# Patient Record
Sex: Male | Born: 1935 | Race: White | Hispanic: No | Smoking: Former smoker
Health system: Southern US, Community
[De-identification: ages and names within clinical notes are randomized; demographics above are authoritative.]

## PROBLEM LIST (undated history)

## (undated) DIAGNOSIS — I639 Cerebral infarction, unspecified: Secondary | ICD-10-CM

## (undated) DIAGNOSIS — C44622 Squamous cell carcinoma of skin of right upper limb, including shoulder: Secondary | ICD-10-CM

## (undated) DIAGNOSIS — D500 Iron deficiency anemia secondary to blood loss (chronic): Secondary | ICD-10-CM

## (undated) DIAGNOSIS — I272 Pulmonary hypertension, unspecified: Secondary | ICD-10-CM

## (undated) DIAGNOSIS — D0461 Carcinoma in situ of skin of right upper limb, including shoulder: Secondary | ICD-10-CM

## (undated) DIAGNOSIS — I071 Rheumatic tricuspid insufficiency: Secondary | ICD-10-CM

## (undated) DIAGNOSIS — E785 Hyperlipidemia, unspecified: Secondary | ICD-10-CM

## (undated) DIAGNOSIS — N401 Enlarged prostate with lower urinary tract symptoms: Secondary | ICD-10-CM

## (undated) DIAGNOSIS — L719 Rosacea, unspecified: Secondary | ICD-10-CM

## (undated) DIAGNOSIS — K219 Gastro-esophageal reflux disease without esophagitis: Secondary | ICD-10-CM

## (undated) DIAGNOSIS — N138 Other obstructive and reflux uropathy: Secondary | ICD-10-CM

## (undated) DIAGNOSIS — Z87448 Personal history of other diseases of urinary system: Secondary | ICD-10-CM

## (undated) DIAGNOSIS — I10 Essential (primary) hypertension: Secondary | ICD-10-CM

## (undated) DIAGNOSIS — I4819 Other persistent atrial fibrillation: Secondary | ICD-10-CM

## (undated) DIAGNOSIS — I2510 Atherosclerotic heart disease of native coronary artery without angina pectoris: Secondary | ICD-10-CM

## (undated) DIAGNOSIS — L570 Actinic keratosis: Secondary | ICD-10-CM

## (undated) DIAGNOSIS — F4321 Adjustment disorder with depressed mood: Secondary | ICD-10-CM

## (undated) DIAGNOSIS — R001 Bradycardia, unspecified: Secondary | ICD-10-CM

## (undated) HISTORY — DX: Bradycardia, unspecified: R00.1

## (undated) HISTORY — PX: CORONARY ARTERY BYPASS GRAFT: SHX141

## (undated) HISTORY — DX: Cerebral infarction, unspecified: I63.9

## (undated) HISTORY — DX: Personal history of other diseases of urinary system: Z87.448

## (undated) HISTORY — PX: TONSILLECTOMY: SUR1361

## (undated) HISTORY — DX: Actinic keratosis: L57.0

## (undated) HISTORY — PX: ABDOMINAL SURGERY: SHX537

## (undated) HISTORY — PX: HERNIA REPAIR: SHX51

## (undated) HISTORY — DX: Essential (primary) hypertension: I10

## (undated) HISTORY — DX: Atherosclerotic heart disease of native coronary artery without angina pectoris: I25.10

## (undated) HISTORY — DX: Adjustment disorder with depressed mood: F43.21

## (undated) HISTORY — PX: BAND HEMORRHOIDECTOMY: SHX1213

## (undated) HISTORY — DX: Iron deficiency anemia secondary to blood loss (chronic): D50.0

## (undated) HISTORY — PX: CHOLECYSTECTOMY: SHX55

## (undated) HISTORY — DX: Gastro-esophageal reflux disease without esophagitis: K21.9

## (undated) HISTORY — PX: OTHER SURGICAL HISTORY: SHX169

## (undated) HISTORY — DX: Benign prostatic hyperplasia with lower urinary tract symptoms: N40.1

## (undated) HISTORY — DX: Hyperlipidemia, unspecified: E78.5

## (undated) HISTORY — DX: Rheumatic tricuspid insufficiency: I07.1

## (undated) HISTORY — DX: Pulmonary hypertension, unspecified: I27.20

## (undated) HISTORY — DX: Benign prostatic hyperplasia with lower urinary tract symptoms: N13.8

## (undated) HISTORY — DX: Rosacea, unspecified: L71.9

## (undated) HISTORY — PX: BACK SURGERY: SHX140

## (undated) HISTORY — PX: KIDNEY SURGERY: SHX687

## (undated) HISTORY — DX: Other persistent atrial fibrillation: I48.19

---
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
## 1999-04-28 ENCOUNTER — Ambulatory Visit (HOSPITAL_BASED_OUTPATIENT_CLINIC_OR_DEPARTMENT_OTHER): Admission: RE | Admit: 1999-04-28 | Discharge: 1999-04-28 | Payer: Self-pay | Admitting: Orthopedic Surgery

## 1999-05-09 ENCOUNTER — Encounter: Admission: RE | Admit: 1999-05-09 | Discharge: 1999-06-09 | Payer: Self-pay | Admitting: Orthopedic Surgery

## 2001-06-24 ENCOUNTER — Encounter: Admission: RE | Admit: 2001-06-24 | Discharge: 2001-07-08 | Payer: Self-pay | Admitting: Internal Medicine

## 2001-11-06 ENCOUNTER — Encounter: Admission: RE | Admit: 2001-11-06 | Discharge: 2001-11-06 | Payer: Self-pay | Admitting: Neurological Surgery

## 2001-11-06 ENCOUNTER — Encounter: Payer: Self-pay | Admitting: Neurological Surgery

## 2002-03-13 ENCOUNTER — Encounter: Payer: Self-pay | Admitting: Neurological Surgery

## 2002-03-13 ENCOUNTER — Encounter: Admission: RE | Admit: 2002-03-13 | Discharge: 2002-03-13 | Payer: Self-pay | Admitting: Neurological Surgery

## 2002-11-20 ENCOUNTER — Inpatient Hospital Stay (HOSPITAL_COMMUNITY): Admission: EM | Admit: 2002-11-20 | Discharge: 2002-12-05 | Payer: Self-pay | Admitting: *Deleted

## 2002-11-20 ENCOUNTER — Encounter: Payer: Self-pay | Admitting: Gastroenterology

## 2002-11-21 ENCOUNTER — Encounter: Payer: Self-pay | Admitting: Gastroenterology

## 2002-11-26 ENCOUNTER — Encounter: Payer: Self-pay | Admitting: Internal Medicine

## 2002-11-29 ENCOUNTER — Encounter: Payer: Self-pay | Admitting: Internal Medicine

## 2002-12-11 ENCOUNTER — Encounter: Payer: Self-pay | Admitting: Gastroenterology

## 2002-12-11 ENCOUNTER — Ambulatory Visit (HOSPITAL_COMMUNITY): Admission: RE | Admit: 2002-12-11 | Discharge: 2002-12-11 | Payer: Self-pay | Admitting: Gastroenterology

## 2002-12-15 ENCOUNTER — Encounter: Payer: Self-pay | Admitting: Gastroenterology

## 2002-12-15 ENCOUNTER — Inpatient Hospital Stay (HOSPITAL_COMMUNITY): Admission: EM | Admit: 2002-12-15 | Discharge: 2003-01-13 | Payer: Self-pay | Admitting: Emergency Medicine

## 2002-12-16 ENCOUNTER — Encounter: Payer: Self-pay | Admitting: Internal Medicine

## 2002-12-17 ENCOUNTER — Encounter: Payer: Self-pay | Admitting: Internal Medicine

## 2002-12-17 ENCOUNTER — Encounter: Payer: Self-pay | Admitting: Gastroenterology

## 2003-02-23 ENCOUNTER — Inpatient Hospital Stay
Admission: RE | Admit: 2003-02-23 | Discharge: 2003-03-06 | Payer: Self-pay | Admitting: Physical Medicine & Rehabilitation

## 2003-03-31 ENCOUNTER — Ambulatory Visit (HOSPITAL_COMMUNITY): Admission: RE | Admit: 2003-03-31 | Discharge: 2003-03-31 | Payer: Self-pay | Admitting: Internal Medicine

## 2003-06-25 ENCOUNTER — Ambulatory Visit (HOSPITAL_COMMUNITY): Admission: RE | Admit: 2003-06-25 | Discharge: 2003-06-25 | Payer: Self-pay | Admitting: Internal Medicine

## 2003-12-09 ENCOUNTER — Ambulatory Visit (HOSPITAL_COMMUNITY): Admission: RE | Admit: 2003-12-09 | Discharge: 2003-12-09 | Payer: Self-pay | Admitting: Internal Medicine

## 2004-01-07 ENCOUNTER — Ambulatory Visit: Payer: Self-pay | Admitting: Internal Medicine

## 2004-01-19 ENCOUNTER — Ambulatory Visit: Payer: Self-pay | Admitting: Internal Medicine

## 2004-01-27 ENCOUNTER — Encounter: Admission: RE | Admit: 2004-01-27 | Discharge: 2004-01-27 | Payer: Self-pay | Admitting: Urology

## 2004-01-29 ENCOUNTER — Encounter (INDEPENDENT_AMBULATORY_CARE_PROVIDER_SITE_OTHER): Payer: Self-pay | Admitting: *Deleted

## 2004-01-29 ENCOUNTER — Ambulatory Visit (HOSPITAL_BASED_OUTPATIENT_CLINIC_OR_DEPARTMENT_OTHER): Admission: RE | Admit: 2004-01-29 | Discharge: 2004-01-29 | Payer: Self-pay | Admitting: Urology

## 2004-01-29 ENCOUNTER — Ambulatory Visit (HOSPITAL_COMMUNITY): Admission: RE | Admit: 2004-01-29 | Discharge: 2004-01-29 | Payer: Self-pay | Admitting: Urology

## 2004-02-05 ENCOUNTER — Ambulatory Visit (HOSPITAL_BASED_OUTPATIENT_CLINIC_OR_DEPARTMENT_OTHER): Admission: RE | Admit: 2004-02-05 | Discharge: 2004-02-05 | Payer: Self-pay | Admitting: General Surgery

## 2004-02-05 ENCOUNTER — Encounter (INDEPENDENT_AMBULATORY_CARE_PROVIDER_SITE_OTHER): Payer: Self-pay | Admitting: *Deleted

## 2004-02-05 ENCOUNTER — Ambulatory Visit (HOSPITAL_COMMUNITY): Admission: RE | Admit: 2004-02-05 | Discharge: 2004-02-05 | Payer: Self-pay | Admitting: General Surgery

## 2004-02-17 ENCOUNTER — Ambulatory Visit (HOSPITAL_COMMUNITY): Admission: RE | Admit: 2004-02-17 | Discharge: 2004-02-17 | Payer: Self-pay | Admitting: Urology

## 2004-02-17 ENCOUNTER — Ambulatory Visit (HOSPITAL_BASED_OUTPATIENT_CLINIC_OR_DEPARTMENT_OTHER): Admission: RE | Admit: 2004-02-17 | Discharge: 2004-02-17 | Payer: Self-pay | Admitting: Urology

## 2004-02-17 ENCOUNTER — Encounter (INDEPENDENT_AMBULATORY_CARE_PROVIDER_SITE_OTHER): Payer: Self-pay | Admitting: *Deleted

## 2004-02-26 ENCOUNTER — Ambulatory Visit: Admission: RE | Admit: 2004-02-26 | Discharge: 2004-02-26 | Payer: Self-pay | Admitting: Urology

## 2004-04-25 ENCOUNTER — Ambulatory Visit (HOSPITAL_COMMUNITY): Admission: RE | Admit: 2004-04-25 | Discharge: 2004-04-25 | Payer: Self-pay | Admitting: Urology

## 2004-05-11 ENCOUNTER — Encounter (INDEPENDENT_AMBULATORY_CARE_PROVIDER_SITE_OTHER): Payer: Self-pay | Admitting: Specialist

## 2004-05-11 ENCOUNTER — Inpatient Hospital Stay (HOSPITAL_COMMUNITY): Admission: RE | Admit: 2004-05-11 | Discharge: 2004-05-14 | Payer: Self-pay | Admitting: Urology

## 2004-05-30 ENCOUNTER — Ambulatory Visit: Payer: Self-pay | Admitting: Gastroenterology

## 2004-05-30 ENCOUNTER — Inpatient Hospital Stay (HOSPITAL_COMMUNITY): Admission: EM | Admit: 2004-05-30 | Discharge: 2004-06-02 | Payer: Self-pay | Admitting: Emergency Medicine

## 2004-06-02 ENCOUNTER — Ambulatory Visit: Payer: Self-pay | Admitting: Internal Medicine

## 2004-06-09 ENCOUNTER — Ambulatory Visit: Payer: Self-pay | Admitting: Internal Medicine

## 2004-06-16 ENCOUNTER — Ambulatory Visit: Payer: Self-pay | Admitting: Internal Medicine

## 2004-06-30 ENCOUNTER — Ambulatory Visit: Payer: Self-pay | Admitting: Internal Medicine

## 2004-08-12 ENCOUNTER — Ambulatory Visit: Payer: Self-pay | Admitting: Internal Medicine

## 2004-11-25 ENCOUNTER — Ambulatory Visit (HOSPITAL_COMMUNITY): Admission: RE | Admit: 2004-11-25 | Discharge: 2004-11-25 | Payer: Self-pay | Admitting: General Surgery

## 2004-11-28 ENCOUNTER — Ambulatory Visit (HOSPITAL_COMMUNITY): Admission: RE | Admit: 2004-11-28 | Discharge: 2004-11-28 | Payer: Self-pay | Admitting: General Surgery

## 2004-12-12 ENCOUNTER — Ambulatory Visit: Payer: Self-pay | Admitting: Internal Medicine

## 2005-01-25 ENCOUNTER — Inpatient Hospital Stay (HOSPITAL_COMMUNITY): Admission: RE | Admit: 2005-01-25 | Discharge: 2005-01-30 | Payer: Self-pay | Admitting: General Surgery

## 2005-03-02 ENCOUNTER — Ambulatory Visit: Payer: Self-pay | Admitting: Internal Medicine

## 2005-03-13 ENCOUNTER — Ambulatory Visit: Payer: Self-pay | Admitting: Internal Medicine

## 2005-03-28 ENCOUNTER — Encounter: Admission: RE | Admit: 2005-03-28 | Discharge: 2005-03-28 | Payer: Self-pay | Admitting: Surgery

## 2005-04-13 ENCOUNTER — Ambulatory Visit: Payer: Self-pay | Admitting: Internal Medicine

## 2005-06-15 ENCOUNTER — Ambulatory Visit: Payer: Self-pay | Admitting: Internal Medicine

## 2005-07-04 ENCOUNTER — Ambulatory Visit: Payer: Self-pay | Admitting: Internal Medicine

## 2005-07-11 ENCOUNTER — Ambulatory Visit: Payer: Self-pay | Admitting: Internal Medicine

## 2005-12-29 ENCOUNTER — Ambulatory Visit: Payer: Self-pay | Admitting: Internal Medicine

## 2005-12-29 LAB — CONVERTED CEMR LAB
Chol/HDL Ratio, serum: 2.7
HDL: 50.2 mg/dL (ref >39.0)
LDL Cholesterol: 80 mg/dL (ref 0–99)
VLDL: 8 mg/dL (ref 0–40)

## 2006-01-08 ENCOUNTER — Ambulatory Visit: Payer: Self-pay | Admitting: Internal Medicine

## 2006-03-28 ENCOUNTER — Ambulatory Visit: Payer: Self-pay | Admitting: Internal Medicine

## 2006-03-28 LAB — CONVERTED CEMR LAB
AST: 20 units/L (ref 0–37)
BUN: 24 mg/dL — ABNORMAL HIGH (ref 6–23)
Creatinine, Ser: 1.4 mg/dL (ref 0.4–1.5)
Eosinophils Relative: 2.1 % (ref 0.0–5.0)
HCT: 37.4 % — ABNORMAL LOW (ref 39.0–52.0)
HDL: 50.2 mg/dL (ref >39.0)
Hemoglobin: 13.5 g/dL (ref 13.0–17.0)
Iron: 87 ug/dL (ref 42–165)
Lymphocytes Relative: 27.7 % (ref 12.0–46.0)
MCHC: 36.1 g/dL — ABNORMAL HIGH (ref 30.0–36.0)
MCV: 92.5 fL (ref 78.0–100.0)
Monocytes Absolute: 0.6 10*3/uL (ref 0.2–0.7)
Platelets: 139 10*3/uL — ABNORMAL LOW (ref 150–400)
Sodium: 138 meq/L (ref 135–145)
TSH: 3.1 microintl units/mL (ref 0.35–5.50)
Total Bilirubin: 0.4 mg/dL (ref 0.3–1.2)
VLDL: 16 mg/dL (ref 0–40)

## 2006-05-29 ENCOUNTER — Ambulatory Visit: Payer: Self-pay | Admitting: Internal Medicine

## 2006-07-09 ENCOUNTER — Ambulatory Visit: Payer: Self-pay | Admitting: Internal Medicine

## 2006-08-01 ENCOUNTER — Ambulatory Visit: Payer: Self-pay | Admitting: Internal Medicine

## 2006-08-01 LAB — CONVERTED CEMR LAB
Albumin: 3.9 g/dL (ref 3.5–5.2)
Alkaline Phosphatase: 46 units/L (ref 39–117)
Basophils Absolute: 0 10*3/uL (ref 0.0–0.1)
Basophils Relative: 0.4 % (ref 0.0–1.0)
Eosinophils Relative: 1.5 % (ref 0.0–5.0)
Iron: 99 ug/dL (ref 42–165)
Lymphocytes Relative: 23.1 % (ref 12.0–46.0)
Monocytes Absolute: 0.9 10*3/uL — ABNORMAL HIGH (ref 0.2–0.7)
Neutrophils Relative %: 60.3 % (ref 43.0–77.0)
Platelets: 150 10*3/uL (ref 150–400)
RBC: 3.66 M/uL — ABNORMAL LOW (ref 4.22–5.81)
RDW: 13.2 % (ref 11.5–14.6)
Total Protein: 6.8 g/dL (ref 6.0–8.3)

## 2006-08-23 DIAGNOSIS — I10 Essential (primary) hypertension: Secondary | ICD-10-CM | POA: Insufficient documentation

## 2006-08-23 DIAGNOSIS — K219 Gastro-esophageal reflux disease without esophagitis: Secondary | ICD-10-CM | POA: Insufficient documentation

## 2006-08-23 DIAGNOSIS — E785 Hyperlipidemia, unspecified: Secondary | ICD-10-CM | POA: Insufficient documentation

## 2006-08-23 DIAGNOSIS — I2510 Atherosclerotic heart disease of native coronary artery without angina pectoris: Secondary | ICD-10-CM | POA: Insufficient documentation

## 2006-10-15 ENCOUNTER — Telehealth: Payer: Self-pay | Admitting: Internal Medicine

## 2006-12-14 ENCOUNTER — Ambulatory Visit: Payer: Self-pay | Admitting: Emergency Medicine

## 2006-12-14 ENCOUNTER — Inpatient Hospital Stay (HOSPITAL_COMMUNITY): Admission: EM | Admit: 2006-12-14 | Discharge: 2006-12-25 | Payer: Self-pay | Admitting: Emergency Medicine

## 2006-12-14 ENCOUNTER — Ambulatory Visit: Payer: Self-pay | Admitting: Cardiovascular Disease

## 2006-12-17 ENCOUNTER — Ambulatory Visit: Payer: Self-pay | Admitting: Physical Medicine & Rehabilitation

## 2006-12-17 ENCOUNTER — Telehealth: Payer: Self-pay | Admitting: *Deleted

## 2006-12-22 ENCOUNTER — Encounter: Payer: Self-pay | Admitting: Internal Medicine

## 2006-12-24 ENCOUNTER — Encounter: Payer: Self-pay | Admitting: Internal Medicine

## 2006-12-25 ENCOUNTER — Ambulatory Visit: Payer: Self-pay | Admitting: Physical Medicine & Rehabilitation

## 2006-12-25 ENCOUNTER — Inpatient Hospital Stay (HOSPITAL_COMMUNITY)
Admission: RE | Admit: 2006-12-25 | Discharge: 2007-01-17 | Payer: Self-pay | Admitting: Physical Medicine & Rehabilitation

## 2006-12-31 ENCOUNTER — Ambulatory Visit: Payer: Self-pay | Admitting: Internal Medicine

## 2007-03-15 ENCOUNTER — Ambulatory Visit: Payer: Self-pay | Admitting: Physical Medicine & Rehabilitation

## 2007-03-15 ENCOUNTER — Encounter
Admission: RE | Admit: 2007-03-15 | Discharge: 2007-03-18 | Payer: Self-pay | Admitting: Physical Medicine & Rehabilitation

## 2007-03-18 ENCOUNTER — Telehealth: Payer: Self-pay | Admitting: Internal Medicine

## 2007-03-21 ENCOUNTER — Ambulatory Visit: Payer: Self-pay | Admitting: Internal Medicine

## 2007-03-26 ENCOUNTER — Ambulatory Visit: Payer: Self-pay | Admitting: Internal Medicine

## 2007-03-26 DIAGNOSIS — D500 Iron deficiency anemia secondary to blood loss (chronic): Secondary | ICD-10-CM

## 2007-03-26 DIAGNOSIS — I69351 Hemiplegia and hemiparesis following cerebral infarction affecting right dominant side: Secondary | ICD-10-CM | POA: Insufficient documentation

## 2007-03-26 HISTORY — DX: Iron deficiency anemia secondary to blood loss (chronic): D50.0

## 2007-03-26 LAB — CONVERTED CEMR LAB
Eosinophils Relative: 1.7 % (ref 0.0–5.0)
MCHC: 32.9 g/dL (ref 30.0–36.0)
Neutro Abs: 4.8 10*3/uL (ref 1.4–7.7)
Neutrophils Relative %: 62.1 % (ref 43.0–77.0)
Platelets: 171 10*3/uL (ref 150–400)
Transferrin: 202.3 mg/dL — ABNORMAL LOW (ref >=212.0)
WBC: 7.6 10*3/uL (ref 4.5–10.5)

## 2007-03-27 ENCOUNTER — Encounter
Admission: RE | Admit: 2007-03-27 | Discharge: 2007-06-25 | Payer: Self-pay | Admitting: Physical Medicine & Rehabilitation

## 2007-04-26 ENCOUNTER — Ambulatory Visit: Payer: Self-pay | Admitting: Internal Medicine

## 2007-04-26 DIAGNOSIS — N401 Enlarged prostate with lower urinary tract symptoms: Secondary | ICD-10-CM | POA: Insufficient documentation

## 2007-04-26 DIAGNOSIS — N138 Other obstructive and reflux uropathy: Secondary | ICD-10-CM

## 2007-04-26 LAB — CONVERTED CEMR LAB: PSA: 0.52 ng/mL (ref 0.10–4.00)

## 2007-05-13 ENCOUNTER — Ambulatory Visit: Payer: Self-pay | Admitting: Physical Medicine & Rehabilitation

## 2007-05-13 ENCOUNTER — Encounter
Admission: RE | Admit: 2007-05-13 | Discharge: 2007-08-11 | Payer: Self-pay | Admitting: Physical Medicine & Rehabilitation

## 2007-06-26 ENCOUNTER — Encounter
Admission: RE | Admit: 2007-06-26 | Discharge: 2007-09-24 | Payer: Self-pay | Admitting: Physical Medicine & Rehabilitation

## 2007-07-11 ENCOUNTER — Encounter: Payer: Self-pay | Admitting: Internal Medicine

## 2007-07-19 ENCOUNTER — Ambulatory Visit: Payer: Self-pay | Admitting: Internal Medicine

## 2007-07-19 LAB — CONVERTED CEMR LAB
Eosinophils Relative: 1.8 % (ref 0.0–5.0)
HCT: 38.2 % — ABNORMAL LOW (ref 39.0–52.0)
MCHC: 34.3 g/dL (ref 30.0–36.0)
MCV: 93.8 fL (ref 78.0–100.0)
Neutro Abs: 2.7 10*3/uL (ref 1.4–7.7)
RBC: 4.07 M/uL — ABNORMAL LOW (ref 4.22–5.81)
RDW: 13 % (ref 11.5–14.6)
WBC: 4.9 10*3/uL (ref 4.5–10.5)

## 2007-07-26 ENCOUNTER — Ambulatory Visit: Payer: Self-pay | Admitting: Internal Medicine

## 2007-07-26 DIAGNOSIS — F325 Major depressive disorder, single episode, in full remission: Secondary | ICD-10-CM | POA: Insufficient documentation

## 2007-07-26 DIAGNOSIS — M7500 Adhesive capsulitis of unspecified shoulder: Secondary | ICD-10-CM | POA: Insufficient documentation

## 2007-07-26 LAB — CONVERTED CEMR LAB
Albumin: 3.7 g/dL (ref 3.5–5.2)
Alkaline Phosphatase: 43 units/L (ref 39–117)
Bilirubin, Direct: 0.1 mg/dL (ref 0.0–0.3)
CO2: 32 meq/L (ref 19–32)
Chloride: 101 meq/L (ref 96–112)
Glucose, Bld: 125 mg/dL — ABNORMAL HIGH (ref 70–99)
Potassium: 4.4 meq/L (ref 3.5–5.1)
Sodium: 140 meq/L (ref 135–145)
Total Bilirubin: 0.9 mg/dL (ref 0.3–1.2)

## 2007-08-05 ENCOUNTER — Encounter: Payer: Self-pay | Admitting: Internal Medicine

## 2007-08-15 ENCOUNTER — Encounter: Payer: Self-pay | Admitting: Internal Medicine

## 2007-09-19 ENCOUNTER — Ambulatory Visit: Payer: Self-pay | Admitting: Internal Medicine

## 2007-10-03 ENCOUNTER — Encounter
Admission: RE | Admit: 2007-10-03 | Discharge: 2007-10-07 | Payer: Self-pay | Admitting: Physical Medicine & Rehabilitation

## 2007-10-07 ENCOUNTER — Ambulatory Visit: Payer: Self-pay | Admitting: Physical Medicine & Rehabilitation

## 2007-12-04 ENCOUNTER — Telehealth: Payer: Self-pay | Admitting: Internal Medicine

## 2007-12-18 ENCOUNTER — Ambulatory Visit: Payer: Self-pay | Admitting: Internal Medicine

## 2007-12-18 DIAGNOSIS — L719 Rosacea, unspecified: Secondary | ICD-10-CM | POA: Insufficient documentation

## 2008-01-21 ENCOUNTER — Telehealth: Payer: Self-pay | Admitting: Internal Medicine

## 2008-03-12 ENCOUNTER — Emergency Department (HOSPITAL_COMMUNITY): Admission: EM | Admit: 2008-03-12 | Discharge: 2008-03-13 | Payer: Self-pay | Admitting: Emergency Medicine

## 2008-03-17 ENCOUNTER — Encounter: Payer: Self-pay | Admitting: Internal Medicine

## 2008-03-19 ENCOUNTER — Ambulatory Visit: Payer: Self-pay | Admitting: Internal Medicine

## 2008-03-19 LAB — CONVERTED CEMR LAB
BUN: 23 mg/dL (ref 6–23)
Basophils Absolute: 0 10*3/uL (ref 0.0–0.1)
CO2: 30 meq/L (ref 19–32)
Calcium: 9.2 mg/dL (ref 8.4–10.5)
Chloride: 104 meq/L (ref 96–112)
Eosinophils Relative: 3.7 % (ref 0.0–5.0)
GFR calc Af Amer: 76 mL/min
GFR calc non Af Amer: 62 mL/min
HCT: 38.7 % — ABNORMAL LOW (ref 39.0–52.0)
HDL: 61.9 mg/dL (ref >39.0)
LDL Cholesterol: 72 mg/dL (ref 0–99)
MCV: 96 fL (ref 78.0–100.0)
Monocytes Relative: 13.3 % — ABNORMAL HIGH (ref 3.0–12.0)
Platelets: 101 10*3/uL — ABNORMAL LOW (ref 150–400)
Potassium: 4.3 meq/L (ref 3.5–5.1)
Total CHOL/HDL Ratio: 2.3
VLDL: 10 mg/dL (ref 0–40)
WBC: 4.3 10*3/uL — ABNORMAL LOW (ref 4.5–10.5)

## 2008-03-25 ENCOUNTER — Ambulatory Visit: Payer: Self-pay | Admitting: Internal Medicine

## 2008-03-26 ENCOUNTER — Telehealth: Payer: Self-pay | Admitting: Internal Medicine

## 2008-03-31 ENCOUNTER — Encounter
Admission: RE | Admit: 2008-03-31 | Discharge: 2008-03-31 | Payer: Self-pay | Admitting: Physical Medicine & Rehabilitation

## 2008-03-31 ENCOUNTER — Ambulatory Visit: Payer: Self-pay | Admitting: Physical Medicine & Rehabilitation

## 2008-04-02 ENCOUNTER — Encounter
Admission: RE | Admit: 2008-04-02 | Discharge: 2008-05-28 | Payer: Self-pay | Admitting: Physical Medicine & Rehabilitation

## 2008-06-12 ENCOUNTER — Ambulatory Visit: Payer: Self-pay | Admitting: Internal Medicine

## 2008-06-12 ENCOUNTER — Encounter: Payer: Self-pay | Admitting: Internal Medicine

## 2008-06-12 ENCOUNTER — Observation Stay (HOSPITAL_COMMUNITY): Admission: EM | Admit: 2008-06-12 | Discharge: 2008-06-12 | Payer: Self-pay | Admitting: Emergency Medicine

## 2008-06-24 ENCOUNTER — Encounter
Admission: RE | Admit: 2008-06-24 | Discharge: 2008-06-24 | Payer: Self-pay | Admitting: Physical Medicine & Rehabilitation

## 2008-06-25 ENCOUNTER — Ambulatory Visit: Payer: Self-pay | Admitting: Internal Medicine

## 2008-06-25 LAB — CONVERTED CEMR LAB
Basophils Relative: 0.5 % (ref 0.0–3.0)
Eosinophils Relative: 2.1 % (ref 0.0–5.0)
Hemoglobin: 10.3 g/dL — ABNORMAL LOW (ref 13.0–17.0)
Monocytes Absolute: 0.6 10*3/uL (ref 0.1–1.0)
Monocytes Relative: 12.3 % — ABNORMAL HIGH (ref 3.0–12.0)
Neutro Abs: 2.5 10*3/uL (ref 1.4–7.7)
Neutrophils Relative %: 57 % (ref 43.0–77.0)
Platelets: 139 10*3/uL — ABNORMAL LOW (ref 150.0–400.0)
RBC: 3.14 M/uL — ABNORMAL LOW (ref 4.22–5.81)

## 2008-07-16 IMAGING — CR DG ABD PORTABLE 1V
1 series · 1 of 1 positions shown · non-contrast
Comparison: 12/20/06.

CLINICAL DATA: 78-year-old with cerebrovascular accident and evaluate Panda tube placement. 
 PORTABLE ABDOMEN:

[view not recorded]
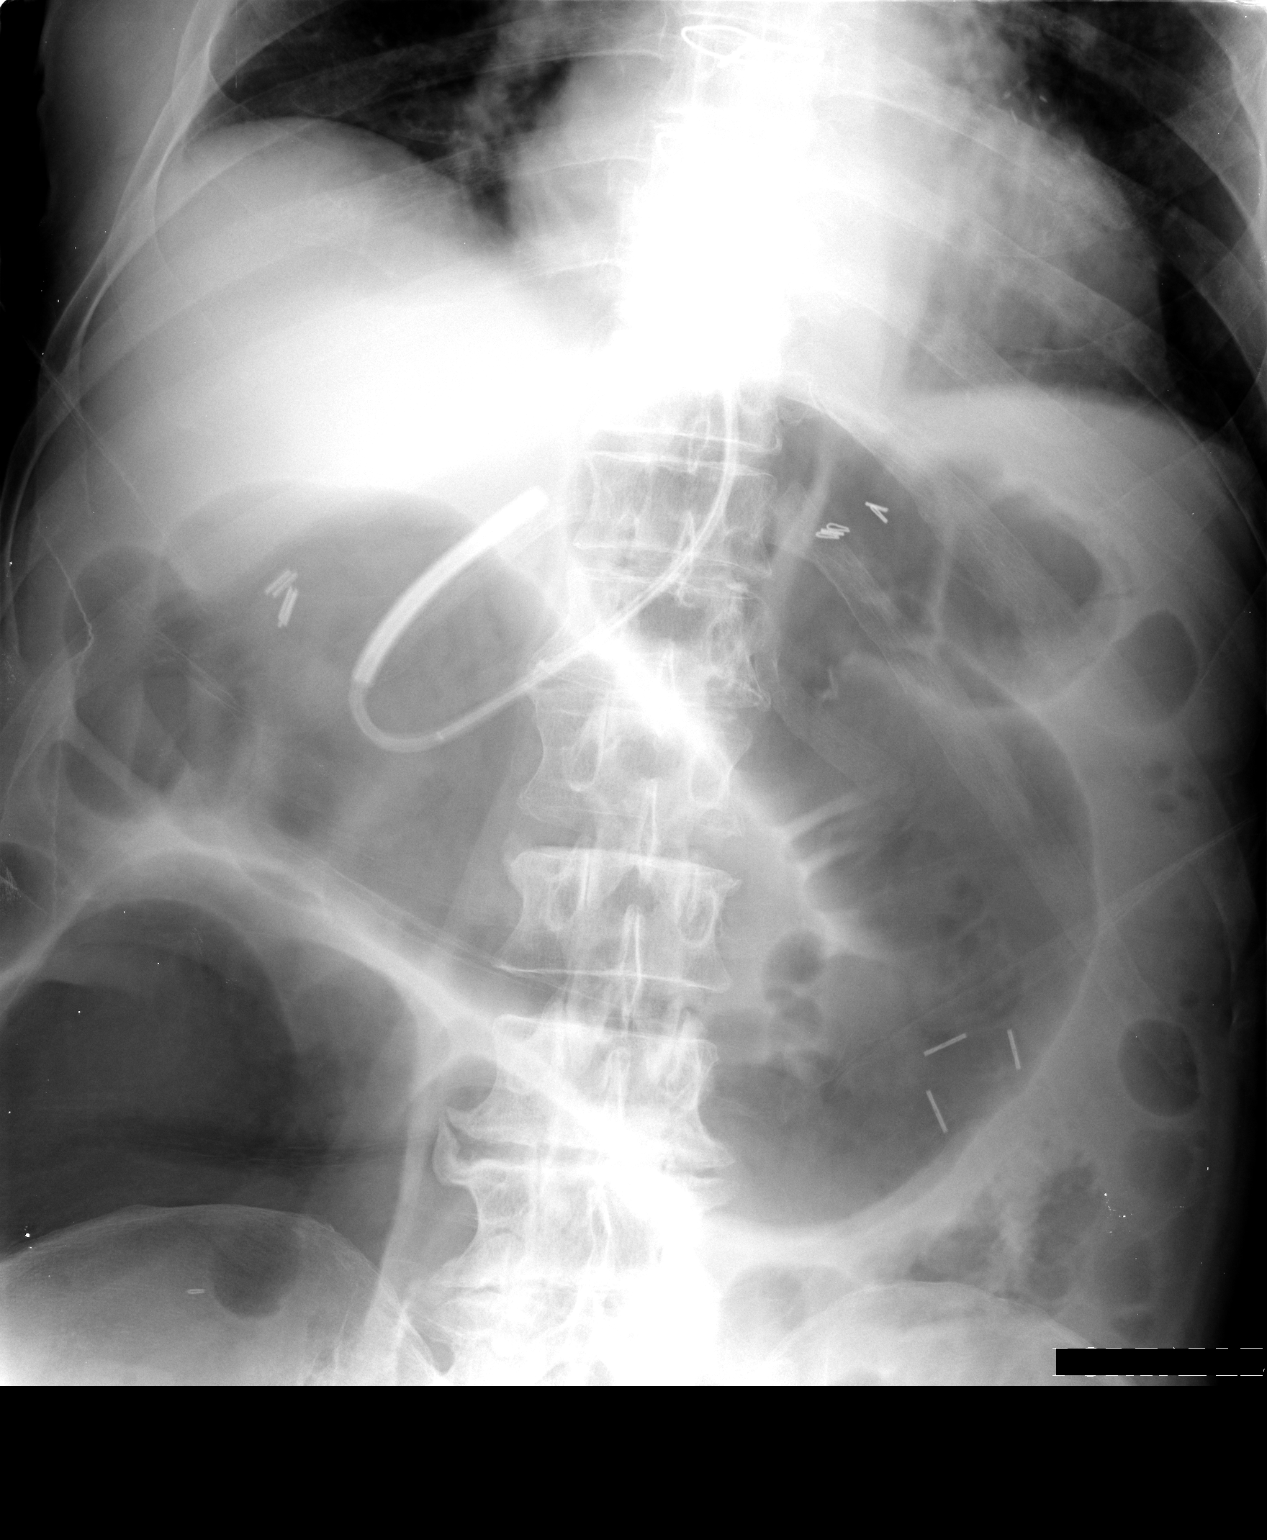

[1 of 1 positions shown; findings below may reference images not displayed]

FINDINGS: A single view of the abdomen demonstrates a Panda tube which appears to be coiled in the stomach.  The tip is in the region of the gastric antrum.  Again noted is massive colonic distention similar to the previous examination.  I cannot exclude a distal colonic obstruction or a transition point near the splenic flexure.  Surgical clips throughout the abdomen.
IMPRESSION: 1.  Panda tube in the region of the gastric antrum. 
 2.  Again noted is massive distention of the colon.

## 2008-07-17 IMAGING — CR DG ABD PORTABLE 1V
1 series · 1 of 1 positions shown · non-contrast
Comparison: none

CLINICAL DATA: stroke, colonic dilatation

[view not recorded]
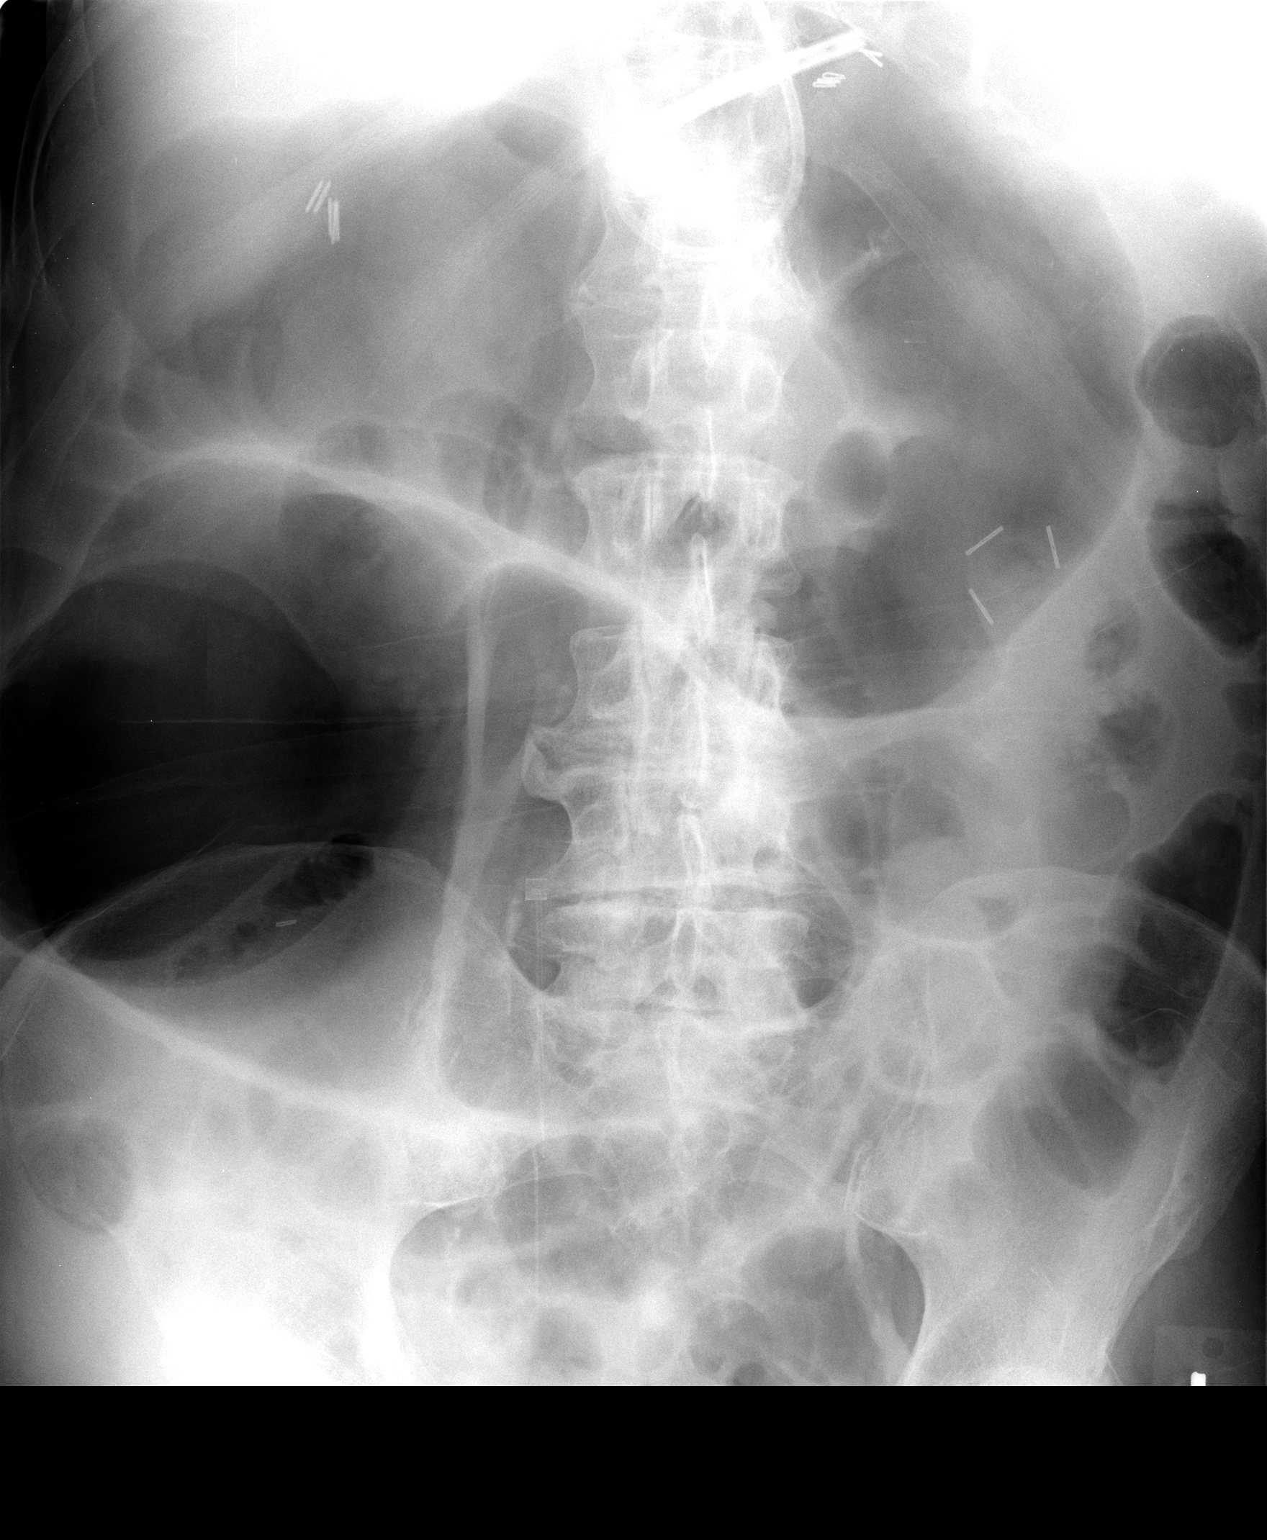

[1 of 1 positions shown; findings below may reference images not displayed]

Portable abdomen at 8538:

Comparison to previous day's exam. The panda feeding tube loops in the stomach.
There is marked gaseous distention of colon with the ascending colon/cecum
region measured 16 cm diameter, approximately same size as seen on previous
exam. There are a few gas filled nondilated small bowel loops scattered
throughout the abdomen. Degenerative changes in the lumbar spine.
IMPRESSION: 1. Gaseous dilatation of the proximal colon, cecum measures 16 cm diameter.

## 2008-08-18 ENCOUNTER — Ambulatory Visit: Payer: Self-pay | Admitting: Internal Medicine

## 2008-08-18 LAB — CONVERTED CEMR LAB
Hemoglobin: 14.4 g/dL (ref 13.0–17.0)
MCHC: 34.6 g/dL (ref 30.0–36.0)
MCV: 97.1 fL (ref 78.0–100.0)
Platelets: 109 10*3/uL — ABNORMAL LOW (ref 150.0–400.0)
RBC: 4.29 M/uL (ref 4.22–5.81)
Transferrin: 196.6 mg/dL — ABNORMAL LOW (ref 212.0–360.0)
WBC: 5.9 10*3/uL (ref 4.5–10.5)

## 2008-10-20 ENCOUNTER — Ambulatory Visit: Payer: Self-pay | Admitting: Internal Medicine

## 2008-12-14 ENCOUNTER — Telehealth: Payer: Self-pay | Admitting: Internal Medicine

## 2009-01-13 ENCOUNTER — Ambulatory Visit: Payer: Self-pay | Admitting: Internal Medicine

## 2009-02-22 ENCOUNTER — Telehealth: Payer: Self-pay | Admitting: Internal Medicine

## 2009-03-26 ENCOUNTER — Ambulatory Visit: Payer: Self-pay | Admitting: Internal Medicine

## 2009-03-26 DIAGNOSIS — R197 Diarrhea, unspecified: Secondary | ICD-10-CM | POA: Insufficient documentation

## 2009-03-26 LAB — CONVERTED CEMR LAB
Basophils Relative: 0.4 % (ref 0.0–3.0)
Calcium: 8.9 mg/dL (ref 8.4–10.5)
Eosinophils Relative: 1.7 % (ref 0.0–5.0)
GFR calc non Af Amer: 62.17 mL/min (ref >60)
Glucose, Bld: 93 mg/dL (ref 70–99)
HDL: 52.8 mg/dL (ref >39.00)
Lymphocytes Relative: 26.2 % (ref 12.0–46.0)
Lymphs Abs: 1.4 10*3/uL (ref 0.7–4.0)
MCHC: 33.5 g/dL (ref 30.0–36.0)
MCV: 97.3 fL (ref 78.0–100.0)
Monocytes Absolute: 0.9 10*3/uL (ref 0.1–1.0)
Neutro Abs: 2.8 10*3/uL (ref 1.4–7.7)
Neutrophils Relative %: 55 % (ref 43.0–77.0)
Saturation Ratios: 35.7 % (ref 20.0–50.0)
Sodium: 140 meq/L (ref 135–145)
Transferrin: 166.1 mg/dL — ABNORMAL LOW (ref 212.0–360.0)

## 2009-04-06 ENCOUNTER — Encounter: Payer: Self-pay | Admitting: Internal Medicine

## 2009-04-20 ENCOUNTER — Telehealth: Payer: Self-pay | Admitting: Internal Medicine

## 2009-05-21 ENCOUNTER — Ambulatory Visit: Payer: Self-pay | Admitting: Internal Medicine

## 2009-05-21 DIAGNOSIS — H6120 Impacted cerumen, unspecified ear: Secondary | ICD-10-CM | POA: Insufficient documentation

## 2009-05-24 ENCOUNTER — Telehealth: Payer: Self-pay | Admitting: Internal Medicine

## 2009-07-30 ENCOUNTER — Ambulatory Visit: Payer: Self-pay | Admitting: Internal Medicine

## 2009-07-30 DIAGNOSIS — L570 Actinic keratosis: Secondary | ICD-10-CM | POA: Insufficient documentation

## 2009-08-09 ENCOUNTER — Telehealth (INDEPENDENT_AMBULATORY_CARE_PROVIDER_SITE_OTHER): Payer: Self-pay | Admitting: *Deleted

## 2009-11-24 ENCOUNTER — Ambulatory Visit: Payer: Self-pay | Admitting: Internal Medicine

## 2010-02-08 ENCOUNTER — Encounter: Payer: Self-pay | Admitting: Internal Medicine

## 2010-02-16 ENCOUNTER — Ambulatory Visit: Payer: Self-pay | Admitting: Internal Medicine

## 2010-03-20 ENCOUNTER — Encounter: Payer: Self-pay | Admitting: Physical Medicine & Rehabilitation

## 2010-03-29 NOTE — Miscellaneous (Signed)
Summary: Location manager   Imported By: Laural Benes 04/26/2009 10:40:31  _____________________________________________________________________  External Attachment:    Type:   Image     Comment:   External Document

## 2010-03-29 NOTE — Progress Notes (Signed)
Summary: low BP  Phone Note Call from Patient   Caller: Spouse Call For: [name] Dillon MD Summary of Call: BP ran down to 70/50 and was dizzy.  He quit taking Tamsulosin and his BP has stablized.  [id number]  Initial call taken by: Deanna Artis CMA,  August 09, 2009 3:56 PM  Follow-up for Phone Call        stay off of that medicine- he is sensitive to it and cant take it per dr [name] Follow-up by: Allyne Gee, LPN,  June 13, 624THL [id number] PM  Additional Follow-up for Phone Call Additional follow up Details #1::        Pt. notified. Additional Follow-up by: Deanna Artis CMA,  August 09, 2009 4:46 PM

## 2010-03-29 NOTE — Assessment & Plan Note (Signed)
Summary: 3 month rov/njr/pts wife rsc/cjr----PTS WIFE [facility] // RS   Vital Signs:  Patient profile:   77 year old male Height:      73 inches Weight:      200 pounds BMI:     26.48 Temp:     98.2 degrees F oral Pulse rate:   64 / minute Resp:     14 per minute BP sitting:   110 / 60  (left arm)  Vitals Entered By: Allyne Gee, LPN (September 28, 624THL 2:23 PM)  Nutrition Counseling: Patient's BMI is greater than 25 and therefore counseled on weight management options. CC: roa, Hypertension Management Is Patient Diabetic? No   Primary Care Provider:  Ricard Dillon MD  CC:  roa and Hypertension Management.  History of Present Illness: increased frustration with aphasia and has stopped speech therapy blood pressure is stable no chest pain no signes of extension of stroke speech worsens with fatigue  Hypertension History:      He denies headache, chest pain, palpitations, dyspnea with exertion, orthopnea, PND, peripheral edema, visual symptoms, neurologic problems, syncope, and side effects from treatment.        Positive major cardiovascular risk factors include male age 103 years old or older, hyperlipidemia, and hypertension.  Negative major cardiovascular risk factors include non-tobacco-user status.        Positive history for target organ damage include ASHD (either angina/prior MI/prior CABG) and prior stroke (or TIA).  Further assessment for target organ damage reveals no history of cardiac end-organ damage (CHF/LVH), peripheral vascular disease, renal insufficiency, or hypertensive retinopathy.     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) 10)  Iron Deficiency Anemia Secondary To Blood Loss  (ICD-280.0) 11)  Hypertension  (ICD-401.9) 12)  Family History of Cad Male 1st Degree Relative <50  (ICD-V17.3) 13)  Hyperlipidemia  (ICD-272.4) 14)  Gerd  (ICD-530.81) 15)  Coronary Artery Disease  (ICD-414.00)  Current Problems (verified): 1)  Actinic Keratosis  (ICD-702.0) 2)  Cerumen Impaction, Right  (ICD-380.4) 3)  Diarrhea  (ICD-787.91) 4)  Anemia, Secondary To Blood Loss  (ICD-280.0) 5)  Rosacea  (ICD-695.3) 6)  Depression, Situational, Severe  (ICD-300.4) 7)  Adhesive Capsulitis of Shoulder  (ICD-726.0) 8)  Benign Prostatic Hypertrophy, With Obstruction  (ICD-600.01) 9)  Cerebrovascular Accident With Right Hemiparesis  (ICD-438.20) 10)  Iron Deficiency Anemia Secondary To Blood Loss  (ICD-280.0) 11)  Hypertension  (ICD-401.9) 12)  Family History of Cad Male 1st Degree Relative <50  (ICD-V17.3) 13)  Hyperlipidemia  (ICD-272.4) 14)  Gerd  (ICD-530.81) 15)  Coronary Artery Disease  (ICD-414.00)  Medications Prior to Update: 1)  Bystolic 5 Mg Tabs (Nebivolol Hcl) .... One By Mouth Daily 2)  Fosamax 70 Mg  Tabs (Alendronate Sodium) .Marland Kitchen.. 1 Tab Weekly 3)  Multivitamin .... Take 1 Tablet By Mouth Once A Day 4)  Aspirin 81mg  .... Once Daily 5)  Crestor 10 Mg  Tabs (Rosuvastatin Calcium) .... Once Daily 6)  Metronidazole 0.75 %  Lotn (Metronidazole) .... Apply To Face Daily 7)  Namenda 10 Mg Tabs (Memantine Hcl) .Marland Kitchen.. 1 Once Daily 8)  Prevacid 15 Mg Cpdr (Lansoprazole) .... One By Mouth Bid 9)  Lexapro 20 Mg Tabs (Escitalopram Oxalate) .Marland Kitchen.. 1 Once Daily 10)  Sucralfate 1 Gm Tabs (Sucralfate) .... One By Mouth Two Times A Day Hold If Become Constipated 11)  Tamsulosin Hcl 0.4 Mg Caps (Tamsulosin Hcl) .... One By Mouth  Daily  Current Medications (verified): 1)  Bystolic 5 Mg Tabs (Nebivolol Hcl) .... One By Mouth Daily 2)  Fosamax 70 Mg  Tabs (Alendronate Sodium) .Marland Kitchen.. 1 Tab Weekly 3)  Multivitamin .... Take 1 Tablet By Mouth Once A Day 4)  Aspirin 81mg  .... Once Daily 5)  Crestor 10 Mg  Tabs (Rosuvastatin Calcium) .... Once Daily 6)  Metronidazole 0.75 % Lotn (Metronidazole) .... Apply To Face Daily 7)  Namenda 10 Mg Tabs (Memantine Hcl) .Marland Kitchen.. 1 Once Daily 8)  Prevacid 15 Mg Cpdr (Lansoprazole) .... One By Mouth Bid 9)  Lexapro 20 Mg Tabs (Escitalopram Oxalate) .Marland Kitchen.. 1 Once Daily 10)  Sucralfate 1 Gm Tabs (Sucralfate) .... One By Mouth Two Times A Day Hold If Become Constipated 11)  Tamsulosin Hcl 0.4 Mg Caps (Tamsulosin Hcl) .... One By Mouth Daily  Allergies (verified): 1)  ! Septra  Past History:  Family History: Last updated: 08/23/2006 Family History of CAD Male 1st degree relative <50 Family History of Cardiovascular disorder Fam hx MI  Social History: Last updated: 08/23/2006 Retired Former Smoker Alcohol use-no  Risk Factors: Smoking Status: quit (11/24/2009)  Past medical, surgical, family and social histories (including risk factors) reviewed for relevance to current acute and chronic problems.  Past Medical History: Reviewed history from 12/18/2007 and no changes required. Coronary artery disease GERD Hyperlipidemia Hypertension rosacia  Past Surgical History: Reviewed history from 06/25/2008 and no changes required. Coronary artery bypass graft x 5 Hemorrhoidectomy-1977 Tonsillectomy-1945 dental implants  Family History: Reviewed history from 08/23/2006 and no changes required. Family History of CAD Male 1st degree relative <50 Family History of Cardiovascular disorder Fam hx MI  Social History: Reviewed history from 08/23/2006 and no changes required. Retired Former Smoker Alcohol use-no  Review of Systems  The patient denies anorexia, fever, weight loss,  weight gain, vision loss, decreased hearing, hoarseness, chest pain, syncope, dyspnea on exertion, peripheral edema, prolonged cough, headaches, hemoptysis, abdominal pain, melena, hematochezia, severe indigestion/heartburn, hematuria, incontinence, genital sores, muscle weakness, suspicious skin lesions, transient blindness, difficulty walking, depression, unusual weight change, abnormal bleeding, enlarged lymph nodes, angioedema, breast masses, and testicular masses.         Flu Vaccine Consent Questions     Do you have a history of severe allergic reactions to this vaccine? no    Any prior history of allergic reactions to egg and/or gelatin? no    Do you have a sensitivity to the preservative Thimersol? no    Do you have a past history of Guillan-Barre Syndrome? no    Do you currently have an acute febrile illness? no    Have you ever had a severe reaction to latex? no    Vaccine information given and explained to patient? yes    Are you currently pregnant? no    Lot Number:[id number]   Exp Date:08/27/2010   Site Given  Left Deltoid IM   Physical Exam  General:  alert and well-hydrated.   Head:  normocephalic and atraumatic.   Eyes:  pupils equal and pupils round.   Ears:  R Canal drainage and R cerumen impaction.   Nose:  External nasal examination shows no deformity or inflammation. Nasal mucosa are pink and moist without lesions or exudates. Neck:  No deformities, masses, or tenderness noted. Lungs:  normal respiratory effort and no intercostal retractions.   Abdomen:  soft, non-tender, and bowel sounds hyperactive.   Msk:  No deformity or scoliosis noted of thoracic or lumbar spine.   Extremities:  No clubbing, cyanosis, edema, or deformity noted with normal full range of motion of all joints.   Neurologic:  alert & oriented X3 and aphasic.     Impression & Recommendations:  Problem # 1:  CEREBROVASCULAR ACCIDENT WITH RIGHT HEMIPARESIS (ICD-438.20) hemiparesis resolved but the  global expressive aphasia persists flu shot today  Problem # 2:  HYPERTENSION (ICD-401.9)  His updated medication list for this problem includes:    Bystolic 5 Mg Tabs (Nebivolol hcl) ..... One by mouth daily  BP today: 110/60 Prior BP: 128/70 (07/30/2009)  Prior 10 Yr Risk Heart Disease: N/A (03/25/2008)  Labs Reviewed: K+: 5.1 (03/26/2009) Creat: : 1.2 (03/26/2009)   Chol: 150 (03/26/2009)   HDL: 52.80 (03/26/2009)   LDL: 72 (03/19/2008)   TG: 52 (03/19/2008)  Problem # 3:  DEPRESSION, SITUATIONAL, SEVERE (ICD-300.4) needs to work with trainer this gave him better outlook  Complete Medication List: 1)  Bystolic 5 Mg Tabs (Nebivolol hcl) .... One by mouth daily 2)  Fosamax 70 Mg Tabs (Alendronate sodium) .Marland Kitchen.. 1 tab weekly 3)  Multivitamin  .... Take 1 tablet by mouth once a day 4)  Aspirin 81mg   .... Once daily 5)  Crestor 10 Mg Tabs (Rosuvastatin calcium) .... Once daily 6)  Metronidazole 0.75 % Lotn (Metronidazole) .... Apply to face daily 7)  Namenda 10 Mg Tabs (Memantine hcl) .Marland Kitchen.. 1 once daily 8)  Prevacid 15 Mg Cpdr (Lansoprazole) .... One by mouth bid 9)  Lexapro 20 Mg Tabs (Escitalopram oxalate) .Marland Kitchen.. 1 once daily 10)  Sucralfate 1 Gm Tabs (Sucralfate) .... One by mouth two times a day hold if become constipated 11)  Tamsulosin Hcl 0.4 Mg Caps (Tamsulosin hcl) .... One by mouth daily  Other Orders: Flu Vaccine 63yrs + MEDICARE PATIENTS [id number]) Administration Flu vaccine - MCR [id number])  Hypertension Assessment/Plan:      The patient's hypertensive risk group is category C: Target organ damage and/or diabetes.  Today's blood pressure is 110/60.  His blood pressure goal is < 140/90.  Patient Instructions: 1)  Please schedule a follow-up appointment in 3 months.

## 2010-03-29 NOTE — Progress Notes (Signed)
  Phone Note Call from Patient   Reason for Call: Lab or Test Results Summary of Call: wife called- she has ov with dr Arnoldo Morale tom orrow and would like dmv papers for [place] completed  tomorrow so she pick up Initial call taken by: Allyne Gee, LPN,  February 22, 624THL 4:20 PM  Follow-up for Phone Call        completed and given to wife- Follow-up by: Allyne Gee, LPN,  February 23, 624THL 4:34 PM

## 2010-03-29 NOTE — Progress Notes (Signed)
Summary: Questions re: Meds  Phone Note Call from Patient Call back at Home Phone [phone]   Caller: Spouse Reason for Call: Talk to Nurse Summary of Call: Wife [name] has questions regarding current medications.  Please call  Initial call taken by: Kennon Rounds,  May 24, 2009 10:05 AM    Prescriptions: LEXAPRO 20 MG TABS (ESCITALOPRAM OXALATE) 1 once daily  #30 x 6   Entered by:   Allyne Gee, LPN   Authorized by:   Ricard Dillon MD   Signed by:   Allyne Gee, LPN on [id number]   Method used:   Electronically to        [facility] (retail)       [address]       [place], [place]  [address]       Ph: [phone] or [phone]       Fax: [phone]   RxID:   [id number]

## 2010-03-29 NOTE — Assessment & Plan Note (Signed)
Summary: 2 MONTH FUP//CCM   Vital Signs:  Patient profile:   77 year old male Height:      73 inches Weight:      195 pounds BMI:     25.82 Temp:     98.6 degrees F oral Pulse rate:   68 / minute Resp:     14 per minute BP sitting:   128 / 70  (left arm)  Vitals Entered By: Allyne Gee, LPN (June  3, 624THL [id number] AM) CC: roa-to discuss changing of expensive meds- c/o area on elbow and area on scalp, Hypertension Management   CC:  roa-to discuss changing of expensive meds- c/o area on elbow and area on scalp and Hypertension Management.  History of Present Illness: DMV testing was done and he was released for daytime driving the pt has not heard from [facility] yet  Hypertension History:      He denies headache, chest pain, palpitations, dyspnea with exertion, orthopnea, PND, peripheral edema, visual symptoms, neurologic problems, syncope, and side effects from treatment.        Positive major cardiovascular risk factors include male age 109 years old or older, hyperlipidemia, and hypertension.  Negative major cardiovascular risk factors include non-tobacco-user status.        Positive history for target organ damage include ASHD (either angina/prior MI/prior CABG) and prior stroke (or TIA).  Further assessment for target organ damage reveals no history of cardiac end-organ damage (CHF/LVH), peripheral vascular disease, renal insufficiency, or hypertensive retinopathy.     Preventive Screening-Counseling & Management  Alcohol-Tobacco     Smoking Status: quit  Current Problems (verified): 1)  Cerumen Impaction, Right  (ICD-380.4) 2)  Diarrhea  (ICD-787.91) 3)  Anemia, Secondary To Blood Loss  (ICD-280.0) 4)  Rosacea  (ICD-695.3) 5)  Depression, Situational, Severe  (ICD-300.4) 6)  Adhesive Capsulitis of Shoulder  (ICD-726.0) 7)  Benign Prostatic Hypertrophy, With Obstruction  (ICD-600.01) 8)  Cerebrovascular Accident With Right Hemiparesis  (ICD-438.20) 9)  Iron Deficiency  Anemia Secondary To Blood Loss  (ICD-280.0) 10)  Hypertension  (ICD-401.9) 11)  Family History of Cad Male 1st Degree Relative <50  (ICD-V17.3) 12)  Hyperlipidemia  (ICD-272.4) 13)  Gerd  (ICD-530.81) 14)  Coronary Artery Disease  (ICD-414.00)  Current Medications (verified): 1)  Bystolic 5 Mg Tabs (Nebivolol Hcl) .... One By Mouth Daily 2)  Fosamax 70 Mg  Tabs (Alendronate Sodium) .Marland Kitchen.. 1 Tab Weekly 3)  Multivitamin .... Take 1 Tablet By Mouth Once A Day 4)  Aspirin 81mg  .... Once Daily 5)  Crestor 10 Mg  Tabs (Rosuvastatin Calcium) .... Once Daily 6)  Metronidazole 0.75 % Lotn (Metronidazole) .... Apply To Face Daily 7)  Namenda 10 Mg Tabs (Memantine Hcl) .Marland Kitchen.. 1 Once Daily 8)  Prevacid 15 Mg Cpdr (Lansoprazole) .... One By Mouth Bid 9)  Lexapro 20 Mg Tabs (Escitalopram Oxalate) .Marland Kitchen.. 1 Once Daily 10)  Sucralfate 1 Gm Tabs (Sucralfate) .... One By Mouth Two Times A Day Hold If Become Constipated 11)  Flomax 0.4 Mg Caps (Tamsulosin Hcl) .Marland Kitchen.. 1 Once Daily 12)  Uroxatral 10 Mg Xr24h-Tab (Alfuzosin Hcl) .... One By Mouth Daily  Allergies (verified): 1)  ! Septra  Past History:  Family History: Last updated: 08/23/2006 Family History of CAD Male 1st degree relative <50 Family History of Cardiovascular disorder Fam hx MI  Social History: Last updated: 08/23/2006 Retired Former Smoker Alcohol use-no  Risk Factors: Smoking Status: quit (07/30/2009)  Past medical, surgical, family and social histories (including risk  factors) reviewed, and no changes noted (except as noted below).  Past Medical History: Reviewed history from 12/18/2007 and no changes required. Coronary artery disease GERD Hyperlipidemia Hypertension rosacia  Past Surgical History: Reviewed history from 06/25/2008 and no changes required. Coronary artery bypass graft x 5 Hemorrhoidectomy-1977 Tonsillectomy-1945 dental implants  Family History: Reviewed history from 08/23/2006 and no changes  required. Family History of CAD Male 1st degree relative <50 Family History of Cardiovascular disorder Fam hx MI  Social History: Reviewed history from 08/23/2006 and no changes required. Retired Former Smoker Alcohol use-no  Review of Systems       The patient complains of hoarseness.  The patient denies anorexia, fever, weight loss, weight gain, vision loss, decreased hearing, chest pain, syncope, dyspnea on exertion, peripheral edema, prolonged cough, headaches, hemoptysis, abdominal pain, melena, hematochezia, severe indigestion/heartburn, hematuria, incontinence, genital sores, muscle weakness, suspicious skin lesions, transient blindness, difficulty walking, depression, unusual weight change, abnormal bleeding, enlarged lymph nodes, angioedema, breast masses, and testicular masses.         aphasia  Physical Exam  General:  alert and well-hydrated.   Head:  normocephalic and atraumatic.   Eyes:  pupils equal and pupils round.   Ears:  R Canal drainage and R cerumen impaction.   Nose:  External nasal examination shows no deformity or inflammation. Nasal mucosa are pink and moist without lesions or exudates. Mouth:  pharynx pink and moist and pharyngeal erythema.   Neck:  No deformities, masses, or tenderness noted. Lungs:  normal respiratory effort and no intercostal retractions.   Abdomen:  soft, non-tender, and bowel sounds hyperactive.   Msk:  No deformity or scoliosis noted of thoracic or lumbar spine.   Skin:  AK on forhead and left elbow Cervical Nodes:  No lymphadenopathy noted Axillary Nodes:  No palpable lymphadenopathy Psych:  Oriented X3 and subdued.     Impression & Recommendations:  Problem # 1:  CEREBROVASCULAR ACCIDENT WITH RIGHT HEMIPARESIS (ICD-438.20) Assessment Improved goo recovery with continues PT  Problem # 2:  HYPERTENSION (ICD-401.9) Assessment: Unchanged  His updated medication list for this problem includes:    Bystolic 5 Mg Tabs (Nebivolol  hcl) ..... One by mouth daily  BP today: 128/70 Prior BP: 142/80 (05/21/2009)  Prior 10 Yr Risk Heart Disease: N/A (03/25/2008)  Labs Reviewed: K+: 5.1 (03/26/2009) Creat: : 1.2 (03/26/2009)   Chol: 150 (03/26/2009)   HDL: 52.80 (03/26/2009)   LDL: 72 (03/19/2008)   TG: 52 (03/19/2008)  Problem # 3:  HYPERLIPIDEMIA (ICD-272.4) Assessment: Unchanged  His updated medication list for this problem includes:    Crestor 10 Mg Tabs (Rosuvastatin calcium) ..... Once daily  Labs Reviewed: SGOT: 22 (03/19/2008)   SGPT: 21 (03/19/2008)  Prior 10 Yr Risk Heart Disease: N/A (03/25/2008)   HDL:52.80 (03/26/2009), 61.9 (03/19/2008)  LDL:72 (03/19/2008), DEL (03/28/2006)  Chol:150 (03/26/2009), 144 (03/19/2008)  Trig:52 (03/19/2008), 82 (03/28/2006)  Problem # 4:  CORONARY ARTERY DISEASE (ICD-414.00) no chest pain on crestor His updated medication list for this problem includes:    Bystolic 5 Mg Tabs (Nebivolol hcl) ..... One by mouth daily  Problem # 5:  ACTINIC KERATOSIS (ICD-702.0)  the lesion was identifies as a    AK on forhead and on left elbow      and 40 seconds of cryotherapy with the liguid nitrogen gun was apllied to the site. The pt tolerated the procedure and post procedure care was discussed  Orders: Cryotherapy/Destruction benign or premalignant lesion (1st lesion)  (17000) Cryotherapy/Destruction benign or  premalignant lesion (2nd-14th lesions) (17003)  Complete Medication List: 1)  Bystolic 5 Mg Tabs (Nebivolol hcl) .... One by mouth daily 2)  Fosamax 70 Mg Tabs (Alendronate sodium) .Marland Kitchen.. 1 tab weekly 3)  Multivitamin  .... Take 1 tablet by mouth once a day 4)  Aspirin 81mg   .... Once daily 5)  Crestor 10 Mg Tabs (Rosuvastatin calcium) .... Once daily 6)  Metronidazole 0.75 % Lotn (Metronidazole) .... Apply to face daily 7)  Namenda 10 Mg Tabs (Memantine hcl) .Marland Kitchen.. 1 once daily 8)  Prevacid 15 Mg Cpdr (Lansoprazole) .... One by mouth bid 9)  Lexapro 20 Mg Tabs  (Escitalopram oxalate) .Marland Kitchen.. 1 once daily 10)  Sucralfate 1 Gm Tabs (Sucralfate) .... One by mouth two times a day hold if become constipated 11)  Tamsulosin Hcl 0.4 Mg Caps (Tamsulosin hcl) .... One by mouth daily  Hypertension Assessment/Plan:      The patient's hypertensive risk group is category C: Target organ damage and/or diabetes.  Today's blood pressure is 128/70.  His blood pressure goal is < 140/90.  Patient Instructions: 1)  replace the uroxitrol with the generic tamsulosin 2)  Please schedule a follow-up appointment in 3 months. Prescriptions: TAMSULOSIN HCL 0.4 MG CAPS (TAMSULOSIN HCL) one by mouth daily  #30 x 11   Entered and Authorized by:   Ricard Dillon MD   Signed by:   Ricard Dillon MD on 07/30/2009   Method used:   Electronically to        [facility] (retail)       [address]       [place], [place]  [address]       Ph: [phone] or [phone]       Fax: [phone]   RxID:   [id number]

## 2010-03-29 NOTE — Assessment & Plan Note (Signed)
Summary: 2 month rov/njr   Vital Signs:  Patient profile:   77 year old male Height:      73 inches Weight:      199 pounds BMI:     26.35 Temp:     98.8 degrees F oral Pulse rate:   68 / minute Resp:     14 per minute BP sitting:   142 / 80  (left arm)  Vitals Entered By: Allyne Gee, LPN (March 25, 624THL [id number] AM) CC: roa   CC:  roa.  History of Present Illness: has not heard from the [facility]   Follow-Up Visit      This is a 77 year old man who presents for Follow-up visit.  blood pressure stable, complains of wax  effecting hearing in right ear no change pain , SOB, patriticipating in speech therapy.  The patient denies chest pain, palpitations, dizziness, syncope, low blood sugar symptoms, high blood sugar symptoms, edema, SOB, DOE, PND, and orthopnea.  Since the last visit the patient notes no new problems or concerns.  The patient reports taking meds as prescribed and monitoring BP.  When questioned about possible medication side effects, the patient notes none.    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.20) 8)  Stye, Internal  (ICD-373.12) 9)  Iron Deficiency Anemia Secondary To Blood Loss  (ICD-280.0) 10)  Hypertension  (ICD-401.9) 11)  Family History of Cad Male 1st Degree Relative <50  (ICD-V17.3) 12)  Hyperlipidemia  (ICD-272.4) 13)  Gerd  (ICD-530.81) 14)  Coronary Artery Disease  (ICD-414.00)  Current Problems (verified): 1)  Diarrhea  (ICD-787.91) 2)  Anemia, Secondary To Blood Loss  (ICD-280.0) 3)  Rosacea  (ICD-695.3) 4)  Depression, Situational, Severe   (ICD-300.4) 5)  Adhesive Capsulitis of Shoulder  (ICD-726.0) 6)  Benign Prostatic Hypertrophy, With Obstruction  (ICD-600.01) 7)  Cerebrovascular Accident With Right Hemiparesis  (ICD-438.20) 8)  Stye, Internal  (ICD-373.12) 9)  Iron Deficiency Anemia Secondary To Blood Loss  (ICD-280.0) 10)  Hypertension  (ICD-401.9) 11)  Family History of Cad Male 1st Degree Relative <50  (ICD-V17.3) 12)  Hyperlipidemia  (ICD-272.4) 13)  Gerd  (ICD-530.81) 14)  Coronary Artery Disease  (ICD-414.00)  Medications Prior to Update: 1)  Bystolic 5 Mg Tabs (Nebivolol Hcl) .... One By Mouth Daily 2)  Fosamax 70 Mg  Tabs (Alendronate Sodium) .Marland Kitchen.. 1 Tab Weekly 3)  Multivitamin .... Take 1 Tablet By Mouth Once A Day 4)  Aspirin 81mg  .... Once Daily 5)  Crestor 10 Mg  Tabs (Rosuvastatin Calcium) .... Once Daily 6)  Metronidazole 0.75 % Lotn (Metronidazole) .... Apply To Face Daily 7)  Namenda 10 Mg Tabs (Memantine Hcl) .Marland Kitchen.. 1 Once Daily 8)  Prevacid 15 Mg Cpdr (Lansoprazole) .... One By Mouth Bid 9)  Sertraline Hcl 50 Mg Tabs (Sertraline Hcl) .... One By Mouth Daily 10)  Sucralfate 1 Gm Tabs (Sucralfate) .... One By Mouth Two Times A Day Hold If Become Constipated  Current Medications (verified): 1)  Bystolic 5 Mg Tabs (Nebivolol Hcl) .... One By Mouth Daily 2)  Fosamax 70 Mg  Tabs (Alendronate Sodium) .Marland Kitchen.. 1 Tab  Weekly 3)  Multivitamin .... Take 1 Tablet By Mouth Once A Day 4)  Aspirin 81mg  .... Once Daily 5)  Crestor 10 Mg  Tabs (Rosuvastatin Calcium) .... Once Daily 6)  Metronidazole 0.75 % Lotn (Metronidazole) .... Apply To Face Daily 7)  Namenda 10 Mg Tabs (Memantine Hcl) .Marland Kitchen.. 1 Once Daily 8)  Prevacid 15 Mg Cpdr (Lansoprazole) .... One By Mouth Bid 9)  Lexapro 20 Mg Tabs (Escitalopram Oxalate) .Marland Kitchen.. 1 Once Daily 10)  Sucralfate 1 Gm Tabs (Sucralfate) .... One By Mouth Two Times A Day Hold If Become Constipated 11)  Flomax 0.4 Mg Caps (Tamsulosin Hcl) .Marland Kitchen.. 1 Once Daily 12)  Uroxatral 10 Mg Xr24h-Tab  (Alfuzosin Hcl) .... One By Mouth Daily  Allergies (verified): 1)  ! Septra  Past History:  Family History: Last updated: 08/23/2006 Family History of CAD Male 1st degree relative <50 Family History of Cardiovascular disorder Fam hx MI  Social History: Last updated: 08/23/2006 Retired Former Smoker Alcohol use-no  Risk Factors: Smoking Status: quit (05/21/2009)  Past medical, surgical, family and social histories (including risk factors) reviewed, and no changes noted (except as noted below).  Past Medical History: Reviewed history from 12/18/2007 and no changes required. Coronary artery disease GERD Hyperlipidemia Hypertension rosacia  Past Surgical History: Reviewed history from 06/25/2008 and no changes required. Coronary artery bypass graft x 5 Hemorrhoidectomy-1977 Tonsillectomy-1945 dental implants  Family History: Reviewed history from 08/23/2006 and no changes required. Family History of CAD Male 1st degree relative <50 Family History of Cardiovascular disorder Fam hx MI  Social History: Reviewed history from 08/23/2006 and no changes required. Retired Former Smoker Alcohol use-no  Review of Systems  The patient denies anorexia, fever, weight loss, weight gain, vision loss, decreased hearing, hoarseness, chest pain, syncope, dyspnea on exertion, peripheral edema, prolonged cough, headaches, hemoptysis, abdominal pain, melena, hematochezia, severe indigestion/heartburn, hematuria, incontinence, genital sores, muscle weakness, suspicious skin lesions, transient blindness, difficulty walking, depression, unusual weight change, abnormal bleeding, enlarged lymph nodes, angioedema, and breast masses.    Physical Exam  General:  alert and well-hydrated.   Head:  normocephalic and atraumatic.   Eyes:  pupils equal and pupils round.   Ears:  R Canal drainage and R cerumen impaction.   Nose:  External nasal examination shows no deformity or inflammation.  Nasal mucosa are pink and moist without lesions or exudates. Neck:  No deformities, masses, or tenderness noted. Lungs:  normal respiratory effort and no intercostal retractions.   Abdomen:  soft, non-tender, and bowel sounds hyperactive.   Msk:  No deformity or scoliosis noted of thoracic or lumbar spine.   Pulses:  R and L carotid,radial,femoral,dorsalis pedis and posterior tibial pulses are full and equal bilaterally Extremities:  No clubbing, cyanosis, edema, or deformity noted with normal full range of motion of all joints.   Neurologic:  alert & oriented X3.   Psych:  Oriented X3 and normally interactive.     Impression & Recommendations:  Problem # 1:  DIARRHEA (ICD-787.91)  resumed with the restart of the flomax  Discussed symptom control and diet. Call if worsening of symptoms or signs of dehydration.   Problem # 2:  BENIGN PROSTATIC HYPERTROPHY, WITH OBSTRUCTION (ICD-600.01) due to the side effect of the flomax wilkl try a drug change to uroxitrol  His updated medication list for this problem includes:    Flomax 0.4 Mg Caps (Tamsulosin hcl) .Marland Kitchen... 1 once daily    Uroxatral 10 Mg Xr24h-tab (Alfuzosin hcl) ..... One by mouth daily  Problem # 3:  CEREBROVASCULAR ACCIDENT WITH RIGHT HEMIPARESIS (ICD-438.20) monitering PT and dysarthria  Problem # 4:  CERUMEN IMPACTION, RIGHT (ICD-380.4)  informed consnet obtained, using a cerumin spoon the wax impaction was dislodged and the canal was lavaged with 1/2 peroxide and 1/2 warm water solution until clear  Orders: Cerumen Impaction Removal [id number])  Complete Medication List: 1)  Bystolic 5 Mg Tabs (Nebivolol hcl) .... One by mouth daily 2)  Fosamax 70 Mg Tabs (Alendronate sodium) .Marland Kitchen.. 1 tab weekly 3)  Multivitamin  .... Take 1 tablet by mouth once a day 4)  Aspirin 81mg   .... Once daily 5)  Crestor 10 Mg Tabs (Rosuvastatin calcium) .... Once daily 6)  Metronidazole 0.75 % Lotn (Metronidazole) .... Apply to face daily 7)   Namenda 10 Mg Tabs (Memantine hcl) .Marland Kitchen.. 1 once daily 8)  Prevacid 15 Mg Cpdr (Lansoprazole) .... One by mouth bid 9)  Lexapro 20 Mg Tabs (Escitalopram oxalate) .Marland Kitchen.. 1 once daily 10)  Sucralfate 1 Gm Tabs (Sucralfate) .... One by mouth two times a day hold if become constipated 11)  Flomax 0.4 Mg Caps (Tamsulosin hcl) .Marland Kitchen.. 1 once daily 12)  Uroxatral 10 Mg Xr24h-tab (Alfuzosin hcl) .... One by mouth daily  Patient Instructions: 1)  the uroxitrol replaces with flomax  2)  moniter its effect in increasing flow and see if the diarrhea improves 3)  Please schedule a follow-up appointment in 2 months 4)  the uroxitrol has been called in Prescriptions: UROXATRAL 10 MG XR24H-TAB (ALFUZOSIN HCL) one by mouth daily  #30 x 11   Entered and Authorized by:   Ricard Dillon MD   Signed by:   Ricard Dillon MD on 05/21/2009   Method used:   Electronically to        [facility] (retail)       [address]       [place], [place]  [address]       Ph: [phone] or [phone]       Fax: [phone]   RxID:   [id number]

## 2010-03-29 NOTE — Assessment & Plan Note (Signed)
Summary: 2 mo rov/mm/pt rsc from bmp/cjr   Vital Signs:  Patient profile:   77 year old male Height:      73 inches Weight:      202 pounds BMI:     26.75 Temp:     98.2 degrees F oral Pulse rate:   68 / minute Resp:     14 per minute BP sitting:   120 / 78  (left arm)  Vitals Entered By: Allyne Gee, LPN (January 28, 624THL 9:55 AM) CC: roa- c/o diarrhea- about 3 stool per day, Hypertension Management   CC:  roa- c/o diarrhea- about 3 stool per day and Hypertension Management.  History of Present Illness: three loose stools a day with increased gas and 'explosive"with mainly water no fever or chills and not new medications or diet change no one else is ill in family and they are on city water  Hypertension History:      He denies headache, chest pain, palpitations, dyspnea with exertion, orthopnea, PND, peripheral edema, visual symptoms, neurologic problems, syncope, and side effects from treatment.        Positive major cardiovascular risk factors include male age 30 years old or older, hyperlipidemia, and hypertension.  Negative major cardiovascular risk factors include non-tobacco-user status.        Positive history for target organ damage include ASHD (either angina/prior MI/prior CABG) and prior stroke (or TIA).  Further assessment for target organ damage reveals no history of cardiac end-organ damage (CHF/LVH), peripheral vascular disease, renal insufficiency, or hypertensive retinopathy.     Preventive Screening-Counseling & Management  Alcohol-Tobacco     Smoking Status: quit  Problems Prior to Update: 1)  Anemia, Secondary To Blood Loss  (ICD-280.0) 2)  Rosacea  (ICD-695.3) 3)  Depression, Situational, Severe  (ICD-300.4) 4)  Adhesive Capsulitis of Shoulder  (ICD-726.0) 5)  Benign Prostatic Hypertrophy, With Obstruction  (ICD-600.01) 6)  Cerebrovascular Accident With Right Hemiparesis  (ICD-438.20) 7)  Stye, Internal  (ICD-373.12) 8)  Iron Deficiency Anemia  Secondary To Blood Loss  (ICD-280.0) 9)  Hypertension  (ICD-401.9) 10)  Family History of Cad Male 1st Degree Relative <50  (ICD-V17.3) 11)  Hyperlipidemia  (ICD-272.4) 12)  Gerd  (ICD-530.81) 13)  Coronary Artery Disease  (ICD-414.00)  Current Problems (verified): 1)  Anemia, Secondary To Blood Loss  (ICD-280.0) 2)  Rosacea  (ICD-695.3) 3)  Depression, Situational, Severe  (ICD-300.4) 4)  Adhesive Capsulitis of Shoulder  (ICD-726.0) 5)  Benign Prostatic Hypertrophy, With Obstruction  (ICD-600.01) 6)  Cerebrovascular Accident With Right Hemiparesis  (ICD-438.20) 7)  Stye, Internal  (ICD-373.12) 8)  Iron Deficiency Anemia Secondary To Blood Loss  (ICD-280.0) 9)  Hypertension  (ICD-401.9) 10)  Family History of Cad Male 1st Degree Relative <50  (ICD-V17.3) 11)  Hyperlipidemia  (ICD-272.4) 12)  Gerd  (ICD-530.81) 13)  Coronary Artery Disease  (ICD-414.00)  Medications Prior to Update: 1)  Bystolic 5 Mg Tabs (Nebivolol Hcl) .... One By Mouth Daily 2)  Ranitidine Hcl 300 Mg Tabs (Ranitidine Hcl) .... One By Mouth Daily 3)  Fosamax 70 Mg  Tabs (Alendronate Sodium) .Marland Kitchen.. 1 Tab Weekly 4)  Multivitamin .... Take 1 Tablet By Mouth Once A Day 5)  Aspirin 81mg  .... Once Daily 6)  Crestor 10 Mg  Tabs (Rosuvastatin Calcium) .... Once Daily 7)  Metronidazole 0.75 % Lotn (Metronidazole) .... Apply To Face Daily 8)  Flomax 0.4 Mg Xr24h-Cap (Tamsulosin Hcl) .... Generic  One By Mouth Daily 9)  Namenda 10 Mg Tabs (  Memantine Hcl) .Marland Kitchen.. 1 Once Daily 10)  Prevacid 15 Mg Cpdr (Lansoprazole) .... One By Mouth Bid 11)  Sertraline Hcl 50 Mg Tabs (Sertraline Hcl) .... One By Mouth Daily  Current Medications (verified): 1)  Bystolic 5 Mg Tabs (Nebivolol Hcl) .... One By Mouth Daily 2)  Fosamax 70 Mg  Tabs (Alendronate Sodium) .Marland Kitchen.. 1 Tab Weekly 3)  Multivitamin .... Take 1 Tablet By Mouth Once A Day 4)  Aspirin 81mg  .... Once Daily 5)  Crestor 10 Mg  Tabs (Rosuvastatin Calcium) .... Once Daily 6)   Metronidazole 0.75 % Lotn (Metronidazole) .... Apply To Face Daily 7)  Namenda 10 Mg Tabs (Memantine Hcl) .Marland Kitchen.. 1 Once Daily 8)  Prevacid 15 Mg Cpdr (Lansoprazole) .... One By Mouth Bid 9)  Sertraline Hcl 50 Mg Tabs (Sertraline Hcl) .... One By Mouth Daily 10)  Sucralfate 1 Gm Tabs (Sucralfate) .... One By Mouth Two Times A Day Hold If Become Constipated  Allergies (verified): 1)  ! Septra  Past History:  Family History: Last updated: 08/23/2006 Family History of CAD Male 1st degree relative <50 Family History of Cardiovascular disorder Fam hx MI  Social History: Last updated: 08/23/2006 Retired Former Smoker Alcohol use-no  Risk Factors: Smoking Status: quit (03/26/2009)  Past medical, surgical, family and social histories (including risk factors) reviewed, and no changes noted (except as noted below).  Past Medical History: Reviewed history from 12/18/2007 and no changes required. Coronary artery disease GERD Hyperlipidemia Hypertension rosacia  Past Surgical History: Reviewed history from 06/25/2008 and no changes required. Coronary artery bypass graft x 5 Hemorrhoidectomy-1977 Tonsillectomy-1945 dental implants  Family History: Reviewed history from 08/23/2006 and no changes required. Family History of CAD Male 1st degree relative <50 Family History of Cardiovascular disorder Fam hx MI  Social History: Reviewed history from 08/23/2006 and no changes required. Retired Former Smoker Alcohol use-no  Review of Systems  The patient denies anorexia, fever, weight loss, weight gain, vision loss, decreased hearing, hoarseness, chest pain, syncope, dyspnea on exertion, peripheral edema, prolonged cough, headaches, hemoptysis, abdominal pain, melena, hematochezia, severe indigestion/heartburn, hematuria, incontinence, genital sores, muscle weakness, suspicious skin lesions, transient blindness, difficulty walking, depression, unusual weight change, abnormal bleeding,  enlarged lymph nodes, angioedema, breast masses, and testicular masses.    Physical Exam  General:  alert and well-hydrated.   Head:  normocephalic and atraumatic.   Eyes:  pupils equal and pupils round.   Ears:  R ear normal and L ear normal.   Mouth:  pharynx pink and moist and pharyngeal erythema.   Neck:  No deformities, masses, or tenderness noted. Lungs:  normal respiratory effort and no intercostal retractions.   Abdomen:  soft, non-tender, and bowel sounds hyperactive.   Msk:  No deformity or scoliosis noted of thoracic or lumbar spine.   Pulses:  R and L carotid,radial,femoral,dorsalis pedis and posterior tibial pulses are full and equal bilaterally Extremities:  No clubbing, cyanosis, edema, or deformity noted with normal full range of motion of all joints.   Neurologic:  alert & oriented X3.     Impression & Recommendations:  Problem # 1:  DIARRHEA (ICD-787.91) Assessment New  stop the flomax and moniter Discussed symptom control and diet. Call if worsening of symptoms or signs of dehydration.   Orders: Venipuncture [id number]) TLB-CBC Platelet - w/Differential (85025-CBCD)  Problem # 2:  IRON DEFICIENCY ANEMIA SECONDARY TO BLOOD LOSS (ICD-280.0) Assessment: Unchanged  Orders: TLB-IBC Pnl (Iron/FE;Transferrin) (83550-IBC)  Hgb: 14.4 (08/18/2008)   Hct: 41.6 (08/18/2008)   Platelets:  109.0 (08/18/2008) RBC: 4.29 (08/18/2008)   RDW: 12.6 (08/18/2008)   WBC: 5.9 (08/18/2008) MCV: 97.1 (08/18/2008)   MCHC: 34.6 (08/18/2008) Iron: 98 (08/18/2008)   % Sat: 35.6 (08/18/2008) TSH: 1.56 (07/26/2007)  Problem # 3:  HYPERLIPIDEMIA (ICD-272.4) Assessment: Unchanged  His updated medication list for this problem includes:    Crestor 10 Mg Tabs (Rosuvastatin calcium) ..... Once daily  Orders: TLB-Cholesterol, HDL (83718-HDL) TLB-Cholesterol, Direct LDL (83721-DIRLDL) TLB-Cholesterol, Total (82465-CHO)  Labs Reviewed: SGOT: 22 (03/19/2008)   SGPT: 21 (03/19/2008)  Prior  10 Yr Risk Heart Disease: N/A (03/25/2008)   HDL:61.9 (03/19/2008), 50.2 (03/28/2006)  LDL:72 (03/19/2008), DEL (03/28/2006)  Chol:144 (03/19/2008), 219 (03/28/2006)  Trig:52 (03/19/2008), 82 (03/28/2006)  Problem # 4:  HYPERTENSION (ICD-401.9) Assessment: Unchanged  His updated medication list for this problem includes:    Bystolic 5 Mg Tabs (Nebivolol hcl) ..... One by mouth daily  Orders: TLB-BMP (Basic Metabolic Panel-BMET) (99991111)  BP today: 120/78 Prior BP: 124/64 (01/13/2009)  Prior 10 Yr Risk Heart Disease: N/A (03/25/2008)  Labs Reviewed: K+: 4.3 (03/19/2008) Creat: : 1.2 (03/19/2008)   Chol: 144 (03/19/2008)   HDL: 61.9 (03/19/2008)   LDL: 72 (03/19/2008)   TG: 52 (03/19/2008)  Complete Medication List: 1)  Bystolic 5 Mg Tabs (Nebivolol hcl) .... One by mouth daily 2)  Fosamax 70 Mg Tabs (Alendronate sodium) .Marland Kitchen.. 1 tab weekly 3)  Multivitamin  .... Take 1 tablet by mouth once a day 4)  Aspirin 81mg   .... Once daily 5)  Crestor 10 Mg Tabs (Rosuvastatin calcium) .... Once daily 6)  Metronidazole 0.75 % Lotn (Metronidazole) .... Apply to face daily 7)  Namenda 10 Mg Tabs (Memantine hcl) .Marland Kitchen.. 1 once daily 8)  Prevacid 15 Mg Cpdr (Lansoprazole) .... One by mouth bid 9)  Sertraline Hcl 50 Mg Tabs (Sertraline hcl) .... One by mouth daily 10)  Sucralfate 1 Gm Tabs (Sucralfate) .... One by mouth two times a day hold if become constipated  Hypertension Assessment/Plan:      The patient's hypertensive risk group is category C: Target organ damage and/or diabetes.  Today's blood pressure is 120/78.  His blood pressure goal is < 140/90.  Patient Instructions: 1)  Stop the flomax and see if the diarrhea stops 2)  take the carafate now but stop it if you become constipated 3)  Please schedule a follow-up appointment in 2 months. Prescriptions: SUCRALFATE 1 GM TABS (SUCRALFATE) one by mouth two times a day hold if become constipated  #60 x 3   Entered and Authorized by:    Ricard Dillon MD   Signed by:   Ricard Dillon MD on 03/26/2009   Method used:   Electronically to        [facility] (retail)       [address]       [place], [place]  [address]       Ph: [phone] or [phone]       Fax: [phone]   RxID:   [id number]

## 2010-03-31 NOTE — Assessment & Plan Note (Signed)
Summary: 3 MONTH FOLLOW UP/CJR   Vital Signs:  Patient profile:   78 year old male Height:      73 inches Weight:      206 pounds BMI:     27.28 Temp:     98.2 degrees F oral Pulse rate:   72 / minute Resp:     14 per minute BP sitting:   132 / 70  (left arm)  Vitals Entered By: Allyne Gee, LPN (December 21, 624THL 1:24 PM) CC: roa-check cyst on back, Hypertension Management Is Patient Diabetic? No   Primary Care Provider:  Ricard Dillon MD  CC:  roa-check cyst on back and Hypertension Management.  History of Present Illness: The pt has an echo, cardiolyte and carotid doplers There was a question of taking  plavix the cardiologist is correct is assessing that plavix is not needed at this time... He has a spot on his back that will not heal  Hypertension History:      He denies headache, chest pain, palpitations, dyspnea with exertion, orthopnea, PND, peripheral edema, visual symptoms, neurologic problems, syncope, and side effects from treatment.        Positive major cardiovascular risk factors include male age 77 years old or older, hyperlipidemia, and hypertension.  Negative major cardiovascular risk factors include non-tobacco-user status.        Positive history for target organ damage include ASHD (either angina/prior MI/prior CABG) and prior stroke (or TIA).  Further assessment for target organ damage reveals no history of cardiac end-organ damage (CHF/LVH), peripheral vascular disease, renal insufficiency, or hypertensive retinopathy.     Preventive Screening-Counseling & Management  Alcohol-Tobacco     Smoking Status: quit     Tobacco Counseling: to remain off tobacco products  Problems Prior to Update: 1)  Actinic Keratosis  (ICD-702.0) 2)  Cerumen Impaction, Right  (ICD-380.4) 3)  Diarrhea  (ICD-787.91) 4)  Anemia, Secondary To Blood Loss  (ICD-280.0) 5)  Rosacea  (ICD-695.3) 6)  Depression, Situational, Severe  (ICD-300.4) 7)  Adhesive Capsulitis of  Shoulder  (ICD-726.0) 8)  Benign Prostatic Hypertrophy, With Obstruction  (ICD-600.01) 9)  Cerebrovascular Accident With Right Hemiparesis  (ICD-438.20) 10)  Iron Deficiency Anemia Secondary To Blood Loss  (ICD-280.0) 11)  Hypertension  (ICD-401.9) 12)  Family History of Cad Male 1st Degree Relative <50  (ICD-V17.3) 13)  Hyperlipidemia  (ICD-272.4) 14)  Gerd  (ICD-530.81) 15)  Coronary Artery Disease  (ICD-414.00)  Current Problems (verified): 1)  Actinic Keratosis  (ICD-702.0) 2)  Cerumen Impaction, Right  (ICD-380.4) 3)  Diarrhea  (ICD-787.91) 4)  Anemia, Secondary To Blood Loss  (ICD-280.0) 5)  Rosacea  (ICD-695.3) 6)  Depression, Situational, Severe  (ICD-300.4) 7)  Adhesive Capsulitis of Shoulder  (ICD-726.0) 8)  Benign Prostatic Hypertrophy, With Obstruction  (ICD-600.01) 9)  Cerebrovascular Accident With Right Hemiparesis  (ICD-438.20) 10)  Iron Deficiency Anemia Secondary To Blood Loss  (ICD-280.0) 11)  Hypertension  (ICD-401.9) 12)  Family History of Cad Male 1st Degree Relative <50  (ICD-V17.3) 13)  Hyperlipidemia  (ICD-272.4) 14)  Gerd  (ICD-530.81) 15)  Coronary Artery Disease  (ICD-414.00)  Medications Prior to Update: 1)  Bystolic 5 Mg Tabs (Nebivolol Hcl) .... One By Mouth Daily 2)  Fosamax 70 Mg  Tabs (Alendronate Sodium) .Marland Kitchen.. 1 Tab Weekly 3)  Multivitamin .... Take 1 Tablet By Mouth Once A Day 4)  Aspirin 81mg  .... Once Daily 5)  Crestor 10 Mg  Tabs (Rosuvastatin Calcium) .... Once Daily 6)  Metronidazole  0.75 % Lotn (Metronidazole) .... Apply To Face Daily 7)  Namenda 10 Mg Tabs (Memantine Hcl) .Marland Kitchen.. 1 Once Daily 8)  Prevacid 15 Mg Cpdr (Lansoprazole) .... One By Mouth Bid 9)  Lexapro 20 Mg Tabs (Escitalopram Oxalate) .Marland Kitchen.. 1 Once Daily 10)  Sucralfate 1 Gm Tabs (Sucralfate) .... One By Mouth Two Times A Day Hold If Become Constipated 11)  Tamsulosin Hcl 0.4 Mg Caps (Tamsulosin Hcl) .... One By Mouth Daily  Current Medications (verified): 1)  Bystolic 5 Mg  Tabs (Nebivolol Hcl) .... One By Mouth Daily 2)  Fosamax 70 Mg  Tabs (Alendronate Sodium) .Marland Kitchen.. 1 Tab Weekly 3)  Multivitamin .... Take 1 Tablet By Mouth Once A Day 4)  Aspirin 81mg  .... Once Daily 5)  Crestor 10 Mg  Tabs (Rosuvastatin Calcium) .... Once Daily 6)  Metronidazole 0.75 % Lotn (Metronidazole) .... Apply To Face Daily 7)  Namenda 10 Mg Tabs (Memantine Hcl) .Marland Kitchen.. 1 Once Daily 8)  Prevacid 15 Mg Cpdr (Lansoprazole) .... One By Mouth Bid 9)  Lexapro 20 Mg Tabs (Escitalopram Oxalate) .Marland Kitchen.. 1 Once Daily 10)  Sucralfate 1 Gm Tabs (Sucralfate) .... One By Mouth Two Times A Day Hold If Become Constipated 11)  Tamsulosin Hcl 0.4 Mg Caps (Tamsulosin Hcl) .... One By Mouth Daily  Allergies (verified): 1)  ! Septra  Past History:  Family History: Last updated: 08/23/2006 Family History of CAD Male 1st degree relative <50 Family History of Cardiovascular disorder Fam hx MI  Social History: Last updated: 08/23/2006 Retired Former Smoker Alcohol use-no  Risk Factors: Smoking Status: quit (02/16/2010)  Past medical, surgical, family and social histories (including risk factors) reviewed, and no changes noted (except as noted below).  Past Medical History: Reviewed history from 12/18/2007 and no changes required. Coronary artery disease GERD Hyperlipidemia Hypertension rosacia  Past Surgical History: Reviewed history from 06/25/2008 and no changes required. Coronary artery bypass graft x 5 Hemorrhoidectomy-1977 Tonsillectomy-1945 dental implants  Family History: Reviewed history from 08/23/2006 and no changes required. Family History of CAD Male 1st degree relative <50 Family History of Cardiovascular disorder Fam hx MI  Social History: Reviewed history from 08/23/2006 and no changes required. Retired Former Smoker Alcohol use-no  Review of Systems  The patient denies anorexia, fever, weight loss, weight gain, vision loss, decreased hearing, hoarseness, chest  pain, syncope, dyspnea on exertion, peripheral edema, prolonged cough, headaches, hemoptysis, abdominal pain, melena, hematochezia, severe indigestion/heartburn, hematuria, incontinence, genital sores, muscle weakness, suspicious skin lesions, transient blindness, difficulty walking, depression, unusual weight change, abnormal bleeding, enlarged lymph nodes, angioedema, and breast masses.    Physical Exam  General:  alert and well-hydrated.   Head:  normocephalic and atraumatic.   Eyes:  pupils equal and pupils round.   Ears:  R Canal drainage and R cerumen impaction.   Nose:  External nasal examination shows no deformity or inflammation. Nasal mucosa are pink and moist without lesions or exudates. Mouth:  pharynx pink and moist and pharyngeal erythema.   Neck:  No deformities, masses, or tenderness noted. Lungs:  normal respiratory effort and no intercostal retractions.     Impression & Recommendations:  Problem # 1:  ACTINIC KERATOSIS (ICD-702.0) Assessment Deteriorated  on back new ulcerative lesion the lesion was identifies as a     AK on back     and 40 seconds of cryotherapy with the liguid nitrogen gun was apllied to the site. The pt tolerated the procedure and post procedure care was discussed  Orders: Cryotherapy/Destruction benign or  premalignant lesion (1st lesion)  (17000)  Problem # 2:  HYPERTENSION (ICD-401.9) Assessment: Improved  His updated medication list for this problem includes:    Bystolic 5 Mg Tabs (Nebivolol hcl) ..... One by mouth daily  BP today: 132/70 Prior BP: 110/60 (11/24/2009)  Prior 10 Yr Risk Heart Disease: N/A (03/25/2008)  Labs Reviewed: K+: 5.1 (03/26/2009) Creat: : 1.2 (03/26/2009)   Chol: 150 (03/26/2009)   HDL: 52.80 (03/26/2009)   LDL: 72 (03/19/2008)   TG: 52 (03/19/2008)  Problem # 3:  HYPERLIPIDEMIA (ICD-272.4) Assessment: Unchanged  His updated medication list for this problem includes:    Crestor 10 Mg Tabs (Rosuvastatin  calcium) ..... Once daily  Labs Reviewed: SGOT: 22 (03/19/2008)   SGPT: 21 (03/19/2008)  Prior 10 Yr Risk Heart Disease: N/A (03/25/2008)   HDL:52.80 (03/26/2009), 61.9 (03/19/2008)  LDL:72 (03/19/2008), DEL (03/28/2006)  Chol:150 (03/26/2009), 144 (03/19/2008)  Trig:52 (03/19/2008), 82 (03/28/2006)  Problem # 4:  GERD (ICD-530.81)  His updated medication list for this problem includes:    Prevacid 15 Mg Cpdr (Lansoprazole) ..... One by mouth bid    Sucralfate 1 Gm Tabs (Sucralfate) ..... One by mouth two times a day hold if become constipated  Labs Reviewed: Hgb: 13.8 (03/26/2009)   Hct: 41.2 (03/26/2009)  Problem # 5:  IRON DEFICIENCY ANEMIA SECONDARY TO BLOOD LOSS (ICD-280.0) Assessment: Improved  Hgb: 13.8 (03/26/2009)   Hct: 41.2 (03/26/2009)   Platelets: 122.0 (03/26/2009) RBC: 4.24 (03/26/2009)   RDW: 12.3 (03/26/2009)   WBC: 5.2 (03/26/2009) MCV: 97.3 (03/26/2009)   MCHC: 33.5 (03/26/2009) Iron: 83 (03/26/2009)   % Sat: 35.7 (03/26/2009) TSH: 1.56 (07/26/2007)  Complete Medication List: 1)  Bystolic 5 Mg Tabs (Nebivolol hcl) .... One by mouth daily 2)  Fosamax 70 Mg Tabs (Alendronate sodium) .Marland Kitchen.. 1 tab weekly 3)  Multivitamin  .... Take 1 tablet by mouth once a day 4)  Aspirin 81mg   .... Once daily 5)  Crestor 10 Mg Tabs (Rosuvastatin calcium) .... Once daily 6)  Metronidazole 0.75 % Lotn (Metronidazole) .... Apply to face daily 7)  Namenda 10 Mg Tabs (Memantine hcl) .Marland Kitchen.. 1 once daily 8)  Prevacid 15 Mg Cpdr (Lansoprazole) .... One by mouth bid 9)  Lexapro 20 Mg Tabs (Escitalopram oxalate) .Marland Kitchen.. 1 once daily 10)  Sucralfate 1 Gm Tabs (Sucralfate) .... One by mouth two times a day hold if become constipated 11)  Tamsulosin Hcl 0.4 Mg Caps (Tamsulosin hcl) .... One by mouth daily  Hypertension Assessment/Plan:      The patient's hypertensive risk group is category C: Target organ damage and/or diabetes.  Today's blood pressure is 132/70.  His blood pressure goal is <  140/90.  Patient Instructions: 1)  Please schedule a follow-up appointment in 3 months.   Orders Added: 1)  Est. Patient Level IV [id number] 2)  Cryotherapy/Destruction benign or premalignant lesion (1st lesion)  [17000]

## 2010-05-16 ENCOUNTER — Other Ambulatory Visit: Payer: Self-pay | Admitting: Internal Medicine

## 2010-05-19 ENCOUNTER — Encounter: Payer: Self-pay | Admitting: Internal Medicine

## 2010-05-23 ENCOUNTER — Encounter: Payer: Self-pay | Admitting: Internal Medicine

## 2010-05-23 ENCOUNTER — Ambulatory Visit (INDEPENDENT_AMBULATORY_CARE_PROVIDER_SITE_OTHER): Payer: Medicare Other | Admitting: Internal Medicine

## 2010-05-23 DIAGNOSIS — I10 Essential (primary) hypertension: Secondary | ICD-10-CM

## 2010-06-08 LAB — DIFFERENTIAL
Basophils Relative: 0 % (ref 0–1)
Eosinophils Absolute: 0.1 10*3/uL (ref 0.0–0.7)
Neutrophils Relative %: 81 % — ABNORMAL HIGH (ref 43–77)

## 2010-06-08 LAB — CBC
Hemoglobin: 10.3 g/dL — ABNORMAL LOW (ref 13.0–17.0)
MCHC: 34.1 g/dL (ref 30.0–36.0)
MCV: 96.2 fL (ref 78.0–100.0)
Platelets: 102 10*3/uL — ABNORMAL LOW (ref 150–400)
RBC: 3.16 MIL/uL — ABNORMAL LOW (ref 4.22–5.81)
WBC: 8.3 10*3/uL (ref 4.0–10.5)

## 2010-06-08 LAB — TYPE AND SCREEN
ABO/RH(D): A POS
Antibody Screen: NEGATIVE

## 2010-06-08 LAB — POCT I-STAT, CHEM 8
Creatinine, Ser: 1.5 mg/dL (ref 0.4–1.5)
Glucose, Bld: 136 mg/dL — ABNORMAL HIGH (ref 70–99)
Hemoglobin: 10.5 g/dL — ABNORMAL LOW (ref 13.0–17.0)
TCO2: 28 mmol/L (ref 0–100)

## 2010-06-08 LAB — COMPREHENSIVE METABOLIC PANEL
ALT: 14 U/L (ref 0–53)
Albumin: 3 g/dL — ABNORMAL LOW (ref 3.5–5.2)
Calcium: 7.7 mg/dL — ABNORMAL LOW (ref 8.4–10.5)
GFR calc Af Amer: >60 mL/min (ref >60)
Glucose, Bld: 106 mg/dL — ABNORMAL HIGH (ref 70–99)
Sodium: 142 mEq/L (ref 135–145)
Total Protein: 5.2 g/dL — ABNORMAL LOW (ref 6.0–8.3)

## 2010-06-08 LAB — PROTIME-INR
INR: 1.3 (ref 0.00–1.49)
Prothrombin Time: 16.1 seconds — ABNORMAL HIGH (ref 11.6–15.2)

## 2010-06-08 LAB — CK TOTAL AND CKMB (NOT AT ARMC): CK, MB: 1.9 ng/mL (ref 0.3–4.0)

## 2010-06-08 LAB — CARDIAC PANEL(CRET KIN+CKTOT+MB+TROPI)
Relative Index: INVALID (ref 0.0–2.5)
Troponin I: <0.01 ng/mL (ref 0.00–0.06)

## 2010-06-08 LAB — TROPONIN I: Troponin I: <0.01 ng/mL (ref 0.00–0.06)

## 2010-06-17 ENCOUNTER — Other Ambulatory Visit: Payer: Self-pay | Admitting: Internal Medicine

## 2010-07-12 NOTE — H&P (Signed)
[name], [name]                ACCOUNT NO.:  [id number]   MEDICAL RECORD NO.:  [id number]          PATIENT TYPE:  INP   LOCATION:  5531                         FACILITY:  [facility]   PHYSICIAN:  Farris Has, MDDATE OF BIRTH:  07/05/1928   DATE OF ADMISSION:  06/12/2008  DATE OF DISCHARGE:                              HISTORY & PHYSICAL   PRIMARY CARE PHYSICIAN:  Dr. Arnoldo Morale   CHIEF COMPLAINT:  Bleeding from a massive fall.   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  The  patient actually had been doing fairly well since then, and today had a  routine procedure for some sort of gum surgery.  After that, he had an  episode where he fell.  The fall was witnessed.  The patient states he  did not hit his head, but because of his apraxia, he is unable to tell  me much about the event at all.  He does state that he did not lose  consciousness.  Unsure if he had tripped or whether this was syncope or  something along those lines.  Although, he is fairly certain he did not  loose consciousness which is reassuring.  Unfortunately, again his  family was not around.  After they did find him, they noted that he was  bleeding from his mouth where the surgery was.  He also was a little bit  more out of it than usual per family, which worried them.  They brought  him into the emergency department because he would not stop bleeding.  Finally, the bleed did stop in the emergency department without any  significant intervention.  He initially was noted to be hypertensive  down to 80s, but responded wonderfully to fluids and currently his blood  pressure was 138/80s and he is feeling much better, but given transient  event of hypertension and bleeding, [facility] hospitalist was called for an  admission for observation.   Otherwise, the patient denies any chest pain, shortness of breath.  No  fevers.  No chills.  No diarrhea.  No constipation per family.  Otherwise, no recent complaints.  The only changes he has made in  medications is that he may have started Bystolic recently, but they are  not quite sure.  This he maybe on since January actually.   PAST MEDICAL HISTORY:  1. 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  2. Aphagia apraxia.  3. Hypertension.  4. Hyperlipidemia.  5. History of coronary artery disease with coronary bypass graft.  6. History of remote pancreatitis.  7. History of MRSA, unsure what the source was.   SOCIAL HISTORY:  The patient does not smoke, does not drink.  Lives at  home with his family, does not abuse drugs.   FAMILY HISTORY:  Noncontributory.   ALLERGIES:  SULFA.   MEDICATIONS:  1. Cardura 4 mg daily.  2.  Bystolic 5 mg daily.  3. Ranitidine.  4. Fosamax 70 a week.  5. Baby aspirin 81 mg daily.  6. Crestor 20 mg daily.  7. Lexapro 10 mg daily.  8. Metronidazole 0.75% lotion applied to face daily.   PHYSICAL EXAMINATION:  VITAL SIGNS:  Temperature not obtained, blood  pressure initially 91/42, now up to 138/86, pulses 52, respirations 20,  saturating 100% on room air.  The patient appears to be currently no  acute distress.  HEENT:  Nontraumatic.  Moist mucous membranes.  There is a surgical site  on the upper right side of his upper palate which currently does not  appear to be bleeding.  LUNGS:  Clear to auscultation bilaterally.  HEART:  Regular rate and rhythm.  Somewhat slow, but no murmurs  appreciated.  Healed surgical scar from CABG noted.  ABDOMEN:  Soft, nontender, nondistended.  LOWER EXTREMITIES:  Without clubbing, cyanosis or edema.  NEUROLOGICAL:  The patient is not fully cooperating with exam, but the  right side is slightly weaker than the left.  SKIN:  Clean, dry  and intact.   LABORATORY DATA:  White blood cell count 8.3, hemoglobin 10.5, sodium  139, potassium 4.9, creatinine 1.5 which is slightly above the baseline  of 1.25 in 2008.   EKG showed normal sinus rhythm, heart rate 50s.  No changes from prior.  No evidence of ischemia.   ASSESSMENT/PLAN:  This is a 77 year old gentleman with recent fall and  slight altered mental status after the fall.  Presents with transient  episode of hypertension and bleeding from the surgical site.  1. Hypertension.  Wonder if patient is slightly dehydrated and volume      depleted after a bleed.  H&H is currently stable.  Bleeding has      stopped.  He is feeling much better, responding well to IV fluids.      Will continue gentle IV hydration.  Check orthostatics.  Hold      Cardura for now and give holding parameters for Bystolic.  2. Fall versus syncope.  Quite unclear story, and the patient unable      to provide me any details.  Although, he does not think he hit his      head, but given he had some alteration in mental status thereafter,      will obtain CT scan of the head just to be on the safe side.  We      will cycle cardiac markers and watch in telemetry.  3. History of CVA.  Once bleeding has completely resolved, will      restart aspirin.  For now will hold.  4. Prophylaxis.  Protonix plus SCDs with Lovenox.  He had a recent      bleed.  5. Surgical site bleeding.  Would try to contact maxillofacial surgeon      in the morning who did the procedure to see if this was to be      expected.  I believe his name was Edsel Petrin.  But his symptoms have      improved currently.   CODE STATUS:  The patient wished to be DNR/DNI.   Dr. Gwendolyn Grant will resume care in the a.m.      Farris Has, MD  Electronically Signed     AVD/MEDQ  D:  06/12/2008  T:  06/12/2008  Job:  [id number]   cc:   Dr. Candi Leash A. Asa Lente, MD

## 2010-07-12 NOTE — Assessment & Plan Note (Signed)
[name] is back regarding his left MCA stroke.  He is receiving  outpatient speech through [facility] at the [facility].  The patient experienced some moderate improvement in his speech through  them.  He is in the second month of therapy there.  He had a close  manipulation of the right shoulder under anesthesia last week by Dr.  Percell Miller and has had good response with that.  His wife notes increased  range of motion.  Sleep is good.  Denies pain today.  Overall, he is  doing quite well.   REVIEW OF SYSTEMS:  Notable for the above.  Full review is in the  written health and history section in the chart.   SOCIAL HISTORY:  The patient is married and living with his wife.   PHYSICAL EXAMINATION:  VITAL SIGNS:  Blood pressure is 115/43, pulse is  48, and respiratory rate 18.  He is sating 96% on room air.  GENERAL:  The patient is pleasant, alert, and oriented to name and  place.  NEUROLOGIC:  He still has some expressive and receptive aphasia,  although it is more expressive in nature.  He is able to follow most  simple 1-step commands, but he does have some confusion and inability to  follow more complex tasks.  Strength is near 5/5 in both lower  extremities.  Right upper extremity strength is 2 to 3+/5.  Shoulder  range of motion is significantly improved with near 90 degrees of  passive abduction today, although we met resistance at 90 degrees.  He  had forward flexion to about 90 degrees again.  He had good rotation and  extension today.  The patient had pain with any range of motion still.  Left shoulder is 5/5, normal range of motion.  HEART:  Regular.  CHEST:  Clear.  ABDOMEN:  Soft and nontender.   ASSESSMENT:  1. Left middle cerebral artery infarct with aphasia, apraxia, and      right-sided weakness.  2. Adhesive capsulitis of right shoulder.   PLAN:  1. The patient has had good results with close manipulation.  Continue      with outpatient OT and then  transition to an exercise program.  I      suggested the [facility] again.  He might do well with water therapy and      some individualized time with the trainer.  His wife will need to      accompany him due to his language deficits.  2. Continue [employer] speech programs.  3. I will see the patient back in 6 months.  He generally made nice      progress.      [name], M.D.  Electronically Signed     ZTS/MedQ  D:  10/07/2007 11:44:20  T:  10/08/2007 02:53:15  Job #:  [id number]   cc:   Ninetta Lights, M.D.  Fax: [place], MD  [place]  [place] [phone]

## 2010-07-12 NOTE — Assessment & Plan Note (Signed)
FOLLOW UP NOTE:  [name] is back regarding his left MCA stroke.  He was  at a [facility] for a period of 4-6 weeks and now is home with his  wife.  He is doing fairly well except for some pain in his right  shoulder.  His wife wonders if he would benefit from a shoulder sling to  support the arm when he is walking.  Therapy had recommended against  that.  He is eating well now.  He is supposed to have his PEG removed  this week.  Mood has been fair though he still has some depression and  anxiety.  His speech is generally improving although he remains grossly  aphasic per his wife.  Sugars have been high at times.  Reading has been  good.  He walks with a cane although has had no problems with imbalance.   REVIEW OF SYSTEMS:  Notable for depression, anxiety, some spasms, and  high sugars.  Otherwise, pertinent positives are listed above and full  review is in the written health and history section.   SOCIAL HISTORY:  Patient is married living with his wife.   PHYSICAL EXAMINATION:  VITAL SIGNS:  Blood pressure 123/66.  Pulse 51.  Respiratory rate 16.  He is sat 98% on room air.  Patient is pleasant,  able to follow 40-50% of commands.  Needs from repetition and cueing at  times.  He remains apraxic as well as receptively and expressively  aphasic.  He has trace movement in the hand.  1+ to 2 movement at the  elbow and shoulder.  He has edema in the right upper extremity at 1+.  He has intact pinprick and pain sensation in the extremity.  His balance  is fair with his cane.  He only uses it occasionally for changes in  directions.  Otherwise, he carries it with him.  He tends to let the  right arm hang low with ambulation but with cueing holds it up higher  and actually uses it to swing somewhat during walking.  Left  shoulder  is tight with abduction, particularly in the right pectoralis major.  He  had positive impingement signs today.  PEG site is still intact.  HEART:  Regular.  CHEST:  Clear.  ABDOMEN:  Soft.  Nontender.   ASSESSMENT:  Left MCA infarct with aphasia, apraxia, right-sided  weakness, particularly in the right upper extremity.   PLAN:  1. I think patient is appropriate to begin outpatient therapies.  We      will make a referral for a physical therapy, occupational therapy,      and speech.  2. After informed consent, we injected the right shoulder today via      from a posterior approach with 40 mg Kenalog and 3 cc 1% lidocaine.      The patient tolerated it well.  Hopefully, this will help improve      range of motion and use of the right upper extremity in standing      and walking.  He needs aggressive stretching on the shoulder and      pectoralis muscles as well.  3. Refilled Avapro today.  He will continue with his blood pressure      medications as advised.  4. Taper off Ritalin over the next week's time and observe for      response.  5. Continue aspirin daily.  6. Patient will resume his Crestor as he was taking at home  prior to      arrival.  7. I will see the patient back in about 2 months' time.  They will      call me with any problems or questions.      Meredith Staggers, M.D.  Electronically Signed    ZTS/MedQ  D:  03/18/2007 12:08:22  T:  03/18/2007 12:38:48  Job #:  [id number]

## 2010-07-12 NOTE — Assessment & Plan Note (Signed)
[facility]   NAME:  [name], [name]                ACCOUNT NO.:  [id number]   MEDICAL RECORD NO.:  [id number]      DATE OF BIRTH:  1929-01-29   PHYSICIAN:  Wallis Bamberg. Johnsie Cancel, MD, [facility]    VISIT DATE:                                   OFFICE VISIT   PROCEDURE:  Transesophageal echocardiogram.   INDICATIONS:  Status post cerebrovascular accident.   HISTORY:  [name] is an unfortunate 77 year old patient admitted with  aphagia, right-sided weakness.  TEE was done to rule out source of  embolus.   DESCRIPTION:  The patient was sedated with 25 mcg of fentanyl and 4 mg  of Versed.  Prior to the study the patient had a feeding tube in and he  was clinically aspirating just sitting there.  We took great care to  keep him on his left side and to suction the back of his throat  continuously.   The probe passed easily.  The patient had normal LV function with an EF  of 60%.  There was no mural apical clot.  Mitral valve was mildly  thickened with mild to moderate MR.  There is moderate left atrial  enlargement.  Right-sided cardiac chambers were normal.  There was no  evidence of pulmonary hypertension.  There was no ASD or VSD.  Aortic  valve was trileaflet and structurally normal.  Aortic root was normal.  There was no significant descending thoracic aortic debris.  Bubble  study was negative for clot and there was no left atrial appendage  thrombus.   FINAL DIAGNOSES:  1. No source of embolus.  2. Negative bubble study.  3. No left atrial appendage clot.  4. No aortic debris.  5. Normal left ventricular ejection fraction of 65%.  6. Mild to moderate mitral regurgitation.  7. Normal aortic valve.   The patient tolerated the procedure well.  Unfortunately, clinically it  would appear that he may need a PEG tube due to chronic aspiration.      Wallis Bamberg. Johnsie Cancel, MD, [facility]  Electronically Signed     PCN/MEDQ  D:  12/20/2006  T:  12/21/2006  Job:  [id number]

## 2010-07-12 NOTE — H&P (Signed)
[name], [name] NO.:  [id number]   MEDICAL RECORD NO.:  [id number]          PATIENT TYPE:  INP   LOCATION:  [place]                         FACILITY:  [facility]   PHYSICIAN:  Flint Melter. Jacolyn Reedy, M.D.DATE OF BIRTH:  05-17-1928   DATE OF ADMISSION:  12/14/2006  DATE OF DISCHARGE:                              HISTORY & PHYSICAL   CHIEF COMPLAINT:  Right-sided weakness and aphasia.  This was a code  stroke, called at 8:33, onset of symptoms last seen normal was between  7:50 p.m. and 8:05 p.m.   PRIMARY PHYSICIAN:  Ricard Dillon, MD   CARDIOLOGY:  Shelva Majestic, M.D.   HISTORY OF PRESENT ILLNESS:  [name] is a 77 year old right-handed  white male with a past medical history significant for coronary artery  disease, status post CABG times 5 vessels a number of years ago.  He has  mild hypertension, hypercholesterolemia, no diabetes, who was in his  usual state of health today.  His wife was with him, around 7:55 p.m.  went into another room.  Actually he left the room to go take medicines  and he was fine.  Less than 10 minutes later his wife heard a thump  and found him fallen face forward in a corner, unable to speak.  She  called the EMS and he was found to have right hemiparesis and was mute.   REVIEW OF SYSTEMS:  According to the wife is negative for chest pain,  fever, headache etc,  according to the wife he had no complaints.   PAST MEDICAL HISTORY:  1. Significant for coronary artery disease, status post CABG 5 vessels      a number of years ago.  2. Hypertension.  3. Acute pancreatitis 4 years ago.  4. Hyperlipidemia.  5. Hernia surgery.  6. Hypertension.  7. Kidney surgery what sounds like it was probably a ureteral stent.   MEDICATIONS:  1. Atacand 4 mg daily.  2. Atenolol 50 mg daily.  3. Aspirin 81 mg a day.  4. Crestor 10 mg daily.  5. Darvocet as needed.  6. Zantac 150 mg daily.   ALLERGIES:  CIPRO, SULFA.   SOCIAL HISTORY:  He is  married.  Remote cigarette use.  Occasional wine  use.  No drug use.   FAMILY HISTORY:  Positive for coronary artery disease.   PHYSICAL EXAMINATION:  VITAL SIGNS:  Temperature 97.2, blood pressure  141/73, pulse 55, respirations 18.  HEENT:  Head is normocephalic, atraumatic.  He has a gash in the right  forehead.  NECK:  Without bruits.  HEART:  Regular rate and rhythm.  LUNGS:  Clear to auscultation.  NEUROLOGIC:  He is alert but mute.  Cannot answer questions or follow  commands.  Left gaze preference is present.  I cannot get his eyes to  the right.  Does blink to threat bilaterally.  Partial right facial  droop.  No drift in the left upper or lower extremity.  On the right he  has 0/5 movement in the right upper extremity. He would score a  4 on  the NIH scale, 2 to 3/5 movement on the right.  He would score probably  a 3 lower extremity.  There is no obvious ataxia but he is not able to  cooperate.  He decreased sensation to nailbed pressure on the right, I  am unable to assess speech, language or extinction.  His total NIH score  is 21.   CT shows nothing acute but does show hyperdense MCA sign on the left.   LABORATORY DATA:  Fairly unremarkable.  BUN 23, creatinine 1.29, white  blood cell count 4.7, platelets 132, hemoglobin 11.6, hematocrit 33.9.  GFR is calculated at 54.   IMPRESSION:  Right hemiparesis, arm and face greater than leg and  muteness with hyperdense sign in the left middle cerebral artery.  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  The patient fell at onset with trauma to his  head and therefore he is not a tPA candidate.   PLAN:  Discussed options with his wife. Will ask for Dr. Jobe Igo to do  angio and see if we can do a clot extraction.  We will then need to do a  stroke workup and rehab.      Catherine A. Jacolyn Reedy, M.D.  Electronically Signed     CAW/MEDQ  D:  12/14/2006  T:  12/16/2006   Job:  [id number]   cc:   Ricard Dillon, MD  Shelva Majestic, M.D.

## 2010-07-12 NOTE — H&P (Signed)
[name], [name] NO.:  [id number]   MEDICAL RECORD NO.:  [id number]           PATIENT TYPE:   LOCATION:                                 FACILITY:   PHYSICIAN:  [name], M.D.DATE OF BIRTH:  1928/05/16   DATE OF ADMISSION:  12/25/2006  DATE OF DISCHARGE:                              HISTORY & PHYSICAL   CHIEF COMPLAINT:  Right-sided weakness and aphasia.   HISTORY OF PRESENT ILLNESS:  This is a 77 year old, right-handed, white  male with history of CAD and CABG who was admitted on December 14, 2006  with right-sided weakness.  MRI revealed left MCA infarct.  No t-PA was  given.  The patient had an unsuccessful attempt for clot extraction per  Radiology.  Follow-up CT revealed evolving stroke and edema with mild  mass effect.  Echocardiogram was unremarkable.  Carotid Dopplers  revealed bilateral 40-60% ICA stenosis.  These were performed on December 18, 2006 and n.p.o. was recommended.  Panda tube was placed.  The  patient had a mild ileus without obstruction and was given laxative  assistance.  Neurology recommended aspirin and subcutaneous Lovenox for  anticoagulation of the patient.  PEE was performed on December 20, 2006  with no remarkable findings.  An MBS was ultimately performed on December 21, 2006 and n.p.o. still was recommended.  Ultimately, a PEG tube was  placed on December 24, 2006.  A rectal tube remained in place for  decompression and the patient's ileus generally has been improving.  He  has been tolerating tube feeds.  The patient is on contact precautions  for history of MRSA a few years ago.  Therefore, the patient was  admitted to inpatient rehab unit today to improve upon his functional  and physical deficits.   REVIEW OF SYSTEMS:  Notable for the above as well as some reflux.  The  patient has been emptying his bladder via Foley.  Other items are in the  written H&P.   PAST MEDICAL HISTORY:  Positive for CAD1/ CABG.  Hypertension.  Pancreatitis with a subsequent [facility] admission in 2005.  Hyperlipidemia.  Hernia surgery.  History of right hydronephrosis status post stenting in 2005.  C. diff x2.  Left shoulder surgery.  History remote tobacco use.  Plus/minus alcohol use.  History of MRSA in 2005.   FAMILY HISTORY:  Positive for CAD.   SOCIAL HISTORY:  The patient lives with his wife who apparently will  assist him as needed upon discharge.   FUNCTIONAL HISTORY:  The patient was independent prior to arrival.  Currently, he is requiring mod to max assist for basic ADLs and min to  mod assist for basic transfers.   ALLERGIES:  SULFA.   MEDICATIONS:  1. Atacand 4 mg daily.  2. Atenolol 50 mg daily.  3. Aspirin 81 mg daily.  4. Crestor 10 mg daily.  5. Zantac p.r.n..   LABS:  Hemoglobin 10.9, white count 6.4, platelets 111.  Sodium 144,  potassium 4.4, BUN and creatinine 14 and 1.1.   PHYSICAL EXAMINATION:  VITAL SIGNS:  Blood  pressure 174/69.  Pulse 61.  Respiratory rate 20.  Temperature 98.6.  GENERAL APPEARANCE:  The patient is lying in bed and appears  comfortable.  He is very flat.  HEENT:  Pupils are equally round and reactive to light.  Ears, nose and  throat exam was unremarkable.  Fairly pink and moist oral mucosa.  Dentition was fair as well.  NECK:  Supple without JVD or lymphadenopathy.  CHEST: Clear to auscultation bilaterally without wheezes, rales or  rhonchi.  HEART: Regular rate and rhythm without murmurs, rubs or gallops.  EXTREMITIES:  Showed no clubbing, cyanosis or obvious edema.  ABDOMEN:  Soft and minimally distended.  PEG site was clean and intact  without any drainage.  SKIN:  Notable for some old scars on the right leg from his graft donor  site.  Otherwise, skin was intact.  NEUROLOGICAL:  Cranial nerves II-XII revealed a right central VII and  tongue deviation.  The patient withdrew to pain in all four extremities.  Reflexes were 1+.  Toes were nonmoving in  the foot with Babinski  testing.  The patient was globally aphasic.  He did have some receptive  abilities, but was very inconsistent with command following.  He  occasionally grunted a few sounds, but was unable to vocalize any words  or phrases.  Judgment, orientation and memory are all difficult to  assess due to language problems.  Mood was flat as noted above.  Strength generally was 0/5 in the right upper extremity.  He may have  had some trace movement at the elbow, but it was difficult to discern.  The right lower extremity is 1+/5 and inconsistent as well.  He may have  had some knee extension and hamstring movement as well as some movement  at the ankle, but was very inconsistent today.   ASSESSMENT AND PLAN:  1. Functional deficits secondary to left middle cerebral artery stroke      with dense right hemiparesis and global aphasia.  I will begin      comprehensive inpatient rehab with physical therapy to assess and      treat for range of motion, transfers and pregait training.  The      patient be able to ambulate for short distances with assistance      depending on his improvement here.  Occupational therapy will      assess and treat range of motion, activities of daily living and      equipment as needed.  A rehab nurse will follow on a 24-hour basis      for bowel, bladder, skin, medication and nutritional issues.      Speech language pathology will follow for dysphagia, aphasia and      communication skills.  Rehab case manager/social worker will assess      for psychosocial needs and discharge planning.  Estimated length of      stay is three weeks.  Goals are min assist and possibly occasional      supervision.  Prognosis is fair.  2. Deep venous thrombosis prophylaxis and anticoagulation with      subcutaneous Lovenox and aspirin.  3. Dysphagia.  Continue to percutaneous endoscopic gastrostomy tube      feeds as ordered.  Future trials of swallowing with speech is       recommended.  4. Hypertension.  Continue to monitor on Tenormin, Avapro and      clonidine.  The patient may need further adjustment going forward  considering today's blood pressures.  5. Hyperlipidemia.  Zocor.  6. History of methicillin-resistant Staphylococcus aureus.  Continue      contact precautions.  7. History of coronary artery disease/coronary artery bypass graft.  8. History of pancreatitis.  9. Ileus.  Perirectal tube out today.  Continue to observe bowel      habits and assist with stool softeners and laxatives as      appropriate.      [name], M.D.  Electronically Signed     ZTS/MEDQ  D:  12/25/2006  T:  12/25/2006  Job:  [id number]   cc:   Blase Mess, MD

## 2010-07-12 NOTE — Assessment & Plan Note (Signed)
[name] is back regarding his left MCA stroke.  He is now home and  receiving outpatient therapy.  His gait is improving.  He has reached  somewhat of a standstill with his speech.  OT has been working on range  of motion and use of the right upper extremity.  He is still blocked  somewhat by tightness in the shoulder.  He has had decreased pain with  the rotator cuff injection but is really limited in his functional use  of the arm.  The PEG is out.  He is eating well.  The wife notes  occasional decreased mood.  Although, he is trying to be active and  walking.  He is eating well.  He has come off the Ritalin without any  obvious adverse effects.   REVIEW OF SYSTEMS:  Notable for some numbness still on the right side as  well as limb swelling, particularly in the hand.  Other pertinent  positives listed above and full review is in the written health and  history section.   SOCIAL HISTORY:  Patient is married, living with his wife.  She is  extremely supportive.   PHYSICAL EXAMINATION:  Blood pressure is 139/78.  Pulse is 50.  Respiratory rate 18.  He is satting 100% on room air.  Patient is  pleasant, alert and oriented to name, place with yes and no questioning.  He has some insight and awareness, although it is difficult to discern  do to his aphasia.  He does fairly well.  He asks no questions but as  far as any other complex questioning and reasoning, it is difficult to  assess due to his language deficits.  He tends to become perseverated on  certain ideas and starts to [facility] and utter a lot of nonsensical  language.  Balance was good without the cane.  He still has tightness of  the right shoulder and I am blocked with abduction at approximately 50  degrees, forward flexion about 70-75 degrees.  It does appear that he  has active movement up  to those points were he is  blocked.  Pain is  only really noticeable when we try to push him past these points where  he has blocks.   There is some atrophy of the right shoulder girdle noted  today.  Left shoulder is intact and moving nicely.  HEART:  Regular.  CHEST:  Clear.  ABDOMEN:  Soft, nontender.  GENERAL:  Strength in the right lower extremity is 4 to 4+/5 with some  apraxia of movement.  He has 3 to 3+/5 strength in the right extremity,  proximal more than distal.   ASSESSMENT:  1. Left MCA infarct withaphasia, apraxia and right-sided weakness.  2. Adhesive capsulitis of the right shoulder.   PLAN:  1. I will make a referral to Dr. Kathryne Hitch for assessment of his      frozen shoulder to see if he would benefit from a manipulation.  I      do not believe that he has significant muscle spasticity leading to      the tightness at this point.  He has responded somewhat to the      shoulder injection from a pain standpoint but his range of motion      still is limited.  2. Suggested some Internet language programs.  He may look at speech      therapy with [facility] in the fall  when they come back in session.  3. Continue off Ritalin and observe for vegetative signs and symptoms.      I do not think antidepressants are indicated at this point.  4. Continue activities at home to promote exercise and range of      motion.  I thought he might get a benefit from      some [employer] supervised exercise.  His wife will look into that.  5. I will see him back in about 5 months' time.      Meredith Staggers, M.D.  Electronically Signed     ZTS/MedQ  D:  05/14/2007 13:20:06  T:  05/14/2007 15:42:14  Job #:  [id number]   cc:   Ninetta Lights, M.D.  Fax: [place], [place]  [place]  [place] [address]

## 2010-07-12 NOTE — Assessment & Plan Note (Signed)
[name] OFFICE NOTE   NAME:MULLBirder Robson                        MRN:          [id number]  DATE:03/21/2007                            DOB:          May 05, 1928    CHIEF COMPLAINT:  Swallowing well, not using gastrostomy tube, please  remove.   Mr. Hansford had been in the [facility]  Dr. Felipa Eth has been helping  to care for him.  He had a percutaneous endoscopic gastrostomy tube  placed by me on December 24, 2006.  He has had a stroke which has left  him with a residual expressive aphasia and dysarthria.  He is, however,  eating well and not using the gastrostomy tube.  He is not on any  antiplatelet agents or anticoagulants that are significant with respect  to removing the gastrostomy tube.   His weight is 177 pounds, pulse 56, blood pressure 130/68.   Note, his past medical history is reviewed.   The gastrostomy tube was removed by cleansing the area with alcohol 1%,  Xylocaine is infiltrated subcutaneously around the tube, and the  traction-pull tube was removed without difficulty and the site was  bandaged by nursing staff.  Care instructions were provided to the  patient and his wife.  They will return as needed.     Gatha Mayer, MD,FACG  Electronically Signed    CEG/MedQ  DD: 03/21/2007  DT: 03/21/2007  Job #: [id number]   cc:   Hal T. Stoneking, M.D.

## 2010-07-12 NOTE — Discharge Summary (Signed)
NAMEJAUN, HINDMARSH NO.:  [id number]   MEDICAL RECORD NO.:  [id number]          PATIENT TYPE:  IPS   LOCATION:  [id number]                         FACILITY:  [facility]   PHYSICIAN:  Lauraine Rinne, P.A.  DATE OF BIRTH:  March 21, 1928   DATE OF ADMISSION:  12/25/2006  DATE OF DISCHARGE:  01/17/2007                               DISCHARGE SUMMARY   DISCHARGE DIAGNOSES:  1. Left middle cerebral artery infarction.  2. Left middle cerebral artery occlusion, status post unsuccessful      attempt to extract clot.  3. Subcutaneous Lovenox for deep vein thrombosis prophylaxis.  4. Dysphagia with gastrostomy feeding tube placed December 24, 2006.  5. Hypertension.  6. Hyperlipidemia.  7. Coronary artery disease with coronary artery bypass grafting.  8. History of pancreatitis.  9. History of methicillin-resistant Staphylococcus aureus with contact      precautions.  10.Ileus, resolved.   A 77 year old right-handed white male with history of coronary artery  disease admitted October 17 with right-sided weakness.  MRI showed left  middle cerebral artery infarction.  MRA with left middle cerebral artery  occlusion, status post unsuccessful attempt for clot extraction per  radiology services.  Follow-up cranial CT scan October 19 with evolving  left middle cerebral artery infarction with increased edema and mild  mass effect.  Echocardiogram with ejection fraction of 65% without clot.  Carotid Dopplers with bilateral 40-60% internal carotid artery stenosis.  Swallow study October 21 advised to be n.p.o.  A Panda  feeding tube was  placed.  Abdominal film completed to confirm tube placement did show a  mild ileus with no obstruction.  Orders were given for laxative  assistance.  Neurology consulted, Dr. Leonie Man, placed on aspirin and  subcutaneous Lovenox.  TEE October 23 with ejection fraction of 60%  without critical findings.  Modified barium swallow October on 24  advised n.p.o.   Gastroenterology was consulted, Dr. Carlean Purl, and  underwent gastrostomy tube placement October 27.  The patient remained  on contact precautions for a history of MRSA.  He remained afebrile.   PAST MEDICAL HISTORY:  1. Coronary artery disease with coronary artery bypass grafting.  2. Hypertension.  3. Pancreatitis.  4. Hyperlipidemia.  5. Hernia surgery.  6. Right hydronephrosis with stent placement in 2005.  7. History of MRSA in 2005.  8. Left shoulder surgery.   Remote tobacco.  Occasional alcohol.   ALLERGIES:  SULFA.   MEDICATIONS PRIOR TO ADMISSION:  1. Atacand 4 mg daily.  2. Atenolol 50 mg daily.  3. Aspirin 81 mg daily.  4. Crestor 10 mg daily.  5. Zantac as needed.   REHABILITATION HOSPITAL COURSE:  The patient was admitted to inpatient  rehab services with therapies initiated on a 3-hour daily basis  consisting of physical therapy, occupational therapy, speech therapy and  rehabilitation nursing.  The following issues were addressed during the  patient's rehabilitation stay.  Pertaining to Mr. Neyland left middle  cerebral artery infarction, he remained on aspirin therapy.  The patient  also on subcutaneous Lovenox for deep vein  thrombosis prophylaxis  throughout his rehab course for monitoring of DVTs.  Overall, for his  functional mobility he was minimal assist level for bathing and dressing  for activities of daily living.  He was maximum assistance for overall  mobility, max assist for basic verbal expression..  He had been placed  on low doses of Ritalin to help regain his overall focus and attention  to tasks.  He remained on 10 mg twice daily.  He was followed by speech  therapy for dysphagia secondary to cerebrovascular accident.  A  gastrostomy feeding tube had been placed on October 27.  However, with  routine follow-up swallow studies his diet since been advanced on  November 14 to a mechanical soft thin liquid diet.  His medications were  crushed and  given in pureed foods.  His tube feeds have been  discontinued November 15.  He was still needing routine flushes of his  gastrostomy tube.  However, it was hopeful this be removed soon, which  had been placed on October 27.  This could be addressed per Dr. Carlean Purl.  His blood pressures were monitored on clonidine, Tenormin and Avapro  with diastolic pressures [id number].  He had a history of hyperlipidemia as  he remained on Zocor.  He had a history of MRSA in 2005, remained on  contact precautions.  He remained afebrile throughout his rehab course.  The patient progressed but slowly.  It was felt that a skilled nursing  facility would be needed with bed becoming available on November 20 and  discharge taking place at that time.   Latest labs showed a prealbumin level of 23 on November 19.  Chemistries  with a sodium 135, potassium 4.1, BUN 22, creatinine 1.25 on November  19.  Latest CBC of 11.2, hematocrit 32.5, WBC 6.7, platelet 220,000.   DISCHARGE MEDICATIONS AT TIME OF DICTATION:  1. Aspirin 325 mg p.o. daily.  2. Clonidine patch 0.2 mg, change every Saturday.  3. Protonix 40 mg p.o. daily.  4. Tenormin 50 mg p.o. daily.  5. Avapro 75 mg p.o. daily.  6. Zocor 40 mg p.o. daily.  7. Ritalin 10 mg p.o. b.i.d. at 7 a.m. and 12 noon.  8. Tylenol 650 mg p.o. every 4 hours as needed pain or fever.   DIET:  Was currently a mechanical soft thin liquid diet, full  supervision, small sips from a cup, oral medications crushed and given  with pureed textures.   SPECIAL INSTRUCTIONS:  The patient should follow up with Dr. Alger Simons, [phone], at the [facility] outpatient rehab center.  Please  call to schedule that appointment.  Gastrostomy feeding tube remained in  place at this time; however, it was felt it could be removed soon.  This  could be addressed per Dr. Carlean Purl of [facility] , phone  number [phone].      Lauraine Rinne, P.A.     DA/MEDQ  D:   01/17/2007  T:  01/17/2007  Job:  [id number]   cc:   Ricard Dillon, MD  [facility]  Duane Lope. Rosana Hoes, M.D.  Pramod P. Leonie Man, MD  Gatha Mayer, MD,FACG

## 2010-07-12 NOTE — Assessment & Plan Note (Signed)
[name] is back regarding a left MCA stroke.  He continues to receive  speech therapy through [facility].  Speech has been slow to improve.  He  started on some Aricept per recommendation of a fellow stroke sufferer.  The wife thinks he may have had some benefits initially, but these  effects seemed to level off somewhat.  He is walking daily, but not  doing much other exercises except for a few stretches for his right  upper extremity.  Right shoulder has been better since the manipulation  as a whole.  He tends still not to use the right hand much for any  activity.  The patient denies pain today.  The wife states mood has been  fair to good with Lexapro.   REVIEW OF SYSTEMS:  Notable for the above.  He has had some diarrhea  with Aricept.  Full review is in the written health and history section  of the chart.   SOCIAL HISTORY:  The patient is married, living with his wife who is  with him here today.   PHYSICAL EXAMINATION:  VITAL SIGNS:  Blood pressure is 150/50, pulse is  43, respiratory rate 18, sating 97% on room air.  GENERAL:  The patient is alert and oriented x3.  Gait is slightly wide-  based.  He is able to stand on either leg with support.  His aphasias is  generally expressive in nature.  He does have some receptive components  as well.  He tends to perseverate to uncertain questions and phrases.  He is able to utter a few one-word spontaneous answers such as yes, no,  but generally his speech is garbled.  His weight appears stable.  HEART:  Regular.  CHEST:  Clear.  ABDOMEN:  Soft, nontender.  EXTREMITIES:  Right shoulder is notable for tightness at about 85-90  degrees of abduction.  He has also had some tightness in rotation as  well, but seems to maintain somewhat since his last visit.  He rarely  offers the right hand in motor activities and tends to prefer using the  left side.  Strength in right upper extremity is generally 3+ to near  4/5.  Right lower extremity is  4+ to 5/5.  SKIN:  Intact.   ASSESSMENT:  1. 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  2. Adhesive capsulitis, right shoulder.   PLAN:  1. I am not sure how much use Aricept has in this particular case.      Considering he is having diarrhea at this point, I think it would      be worthwhile to stop the Aricept and see how he does from a side      effect and speech standpoint.  I do not know of any evidence that      supports using Aricept for aphasia.  2. We will get the patient back into outpatient PT and OT to work on      range of motion, right upper extremities, as well as balance,      strengthening, etc.  Also consider constrained induced therapies.      I would like to transition to regular exercise program as well,      once completed.  3. Continue [facility] speech treatments.  4. I will see him back in about three months' time.      Meredith Staggers, M.D.  Electronically Signed  ZTS/MedQ  D:  03/31/2008 12:19:52  T:  04/01/2008 02:12:03  Job #:  [id number]

## 2010-07-12 NOTE — Discharge Summary (Signed)
NAMEDENIZ, TWEEDY                ACCOUNT NO.:  [id number]   MEDICAL RECORD NO.:  [id number]          PATIENT TYPE:  INP   LOCATION:  [id number]                         FACILITY:  [facility]   PHYSICIAN:  Pramod P. Leonie Man, MD    DATE OF BIRTH:  1928/06/21   DATE OF ADMISSION:  12/14/2006  DATE OF DISCHARGE:  12/25/2006                               DISCHARGE SUMMARY   DISCHARGE DIAGNOSES:  1. Large left MCA infarct secondary to M1 occlusion with failed      mechanical clot extraction.  Stroke felt to be embolic in nature      though no etiology found.  2. Dysphagia secondary to stroke status PEG.  3. Colonic ileus, resolved.  4. Hypertension.  5. Hyperlipidemia.  6. Coronary artery disease status post CABG x5 several years ago.  7. History of acute pancreatitis 4 years ago with resulting feeding      tube placement.  8. History of hernia surgery.  9. History of kidney surgery which sounds like a likely ureteral      stent.   DISCHARGE MEDICATIONS:  1. Atacand 4 mg a day.  2. Tenormin 50 mg a day.  3. Crestor 10 mg a day.  4. Protonix 40 mg a day.  5. Reglan 10 mg p.o. q.6 h.  6. Aspirin 325 mg a day.  7. Lovenox 40 mg subcu a day.  8. Tube feeding Osmolite 1.2 at 65 mL an hour.  9. Free water 100 meals q.4 h.   STUDIES PERFORMED:  1. CT of the brain on admission. Hyperdense left middle cerebral      artery consistent with acute occlusion.  No hemorrhage.  No mass      effect. Cerebral angiogram shows occluded proximal left middle      cerebral artery 1-2 mm beyond the ICA terminus with unsuccessful      attempt at assessing the left middle cerebral artery using three      different microcatheters, two guide catheters and two different      sheaths.  The study was aborted due to unsuccessful      catheterization.  A Starclose device was deployed following the      procedure.  2. CT of the brain post angiogram showed some loss of the basal      ganglia but no hemorrhagic  complication.  3. MRI of the brain showed acute left middle cerebral artery infarct.      No midline shift or hemorrhage. Chest x-ray shows ET tube in      satisfactory placement, suboptimal inspiration.  4. Abdominal x-ray on October 21 confirmed panda placement. Repeat      abdomen showed a dilated large bowel that was incompletely      evaluated. Abdominal x-ray on October 23 showed colonic distention      with cecum at 13 cm. Abdominal x-ray on October 24 again shows      massive distention of the colon and on the 25th showed gaseous      dilatation in proximal colon with cecum at 16 cm. Abdomen x-ray on  the 26th showed decrease in gaseous distention with NG tube in the      stomach and final abdominal x-ray on the 27th showed improving      gaseous distention of the colon.  There was carotid Doppler that      showed a right 40-60% ICA stenosis in the mid range of the scale      and a left 40-60% ICA stenosis in the upper range of scale. Her      vertebral artery flow was antegrade bilaterally.  Her transthoracic      echocardiogram showed EF of 60-65% with no source of embolus.  EKG      showed normal sinus rhythm and left axis deviation. Transcranial      Doppler was performed, results pending.   LABORATORY STUDIES:  Chemistries at the time of discharge with potassium  3.4, glucose 129, otherwise normal. C Dif is negative.  CBC with  hemoglobin 11.3, hematocrit 33.4, white blood cells 6.2, red blood cells  3.49, platelets 166.  Blood culture negative.  Phosphorus 2.4, magnesium  1.8.  Homocystine 11.6, hemoglobin A1c 5.9.  Cholesterol 122,  triglycerides 47, HDL 48 and LDL 65. Alcohol level was 48 on admission.  Cardiac enzymes were negative.   HISTORY OF PRESENT ILLNESS:  Mr. Morena is a 77 year old right-handed  Caucasian male with a past medical history of coronary artery disease  status post CABG, hypertension, hypercholesterolemia.  He was in his  usual state of health  the day of admission when around 7:55 in the  evening he went into the other room.  He left the room to go to take  some medicines and he was fine.  Less than 10 minutes later, his wife  heard a thump and found him fallen face forward in a corner unable to  speak. She called EMS and the patient was found to have right  hemiparesis and was mute. In the emergency room, CT of the brain showed  a dense right MCA sign.  TPA was considered but not given secondary to  fall.  He was sent to neuro intervention for angiogram where a left MCA  clot was noted.  Attempted mechanical retrieval of device without  success.  He was admitted to the neuro ICU.   HOSPITAL COURSE:  MRI was positive for a large left MCA stroke with  basal ganglia white matter and cortical involvement.  It discharge spare  the total MCA due to collaterals.  He was initially placed on Cardene  drip and blood pressure returned to normal levels. He was extubated  within the first few days and tolerated that well.  He was transferred  to the floor.  He was evaluated by PT, OT and speech and felt he would  benefit from inpatient rehab.  He was unable to swallow after assessment  by speech therapy with unlikely quick recovery.  GI was arranged for PEG  tube placement.  Pending the feeding tube insertion, the patient  developed a colonic ileus which eventually had to be compressed with a  rectal tube.  After decompression and enemas, the patient's ileus  resolved. The PEG  was placed and he was stable for transfer to rehab.  Of note, the patient was placed on aspirin for secondary stroke  prevention.   CONDITION ON DISCHARGE:  Patient awake and alert.  He is globally  aphasic.  He is not following commands and he has no speech.  He has  right upper extremity plegia  but he moves his right lower extremity 1/5.  He has good left-sided strength. Heart rate is regular.  His breath  sounds are clear.   DISCHARGE/PLAN:  1. Transfer to  rehab for continuation of PT, OT and speech therapy.  2. New PEG  for feeding. Plan for an eventual bolus tube feeding once      on rehab.  3. The patient needs ongoing risk factor control.  4. Follow-up with primary care physician 1 month after discharge from      rehab.  5. Follow-up with Dr. Antony Contras, 2-3 after discharge from rehab.      Burnetta Sabin, N.P.    ______________________________  Kathie Rhodes. Leonie Man, MD    SB/MEDQ  D:  12/25/2006  T:  12/25/2006  Job:  [id number]   cc:   Ricard Dillon, MD  Shelva Majestic, M.D.  [facility]

## 2010-07-12 NOTE — Discharge Summary (Signed)
NAMEKYRESE, RETTKE                ACCOUNT NO.:  [id number]   MEDICAL RECORD NO.:  [id number]          PATIENT TYPE:  INP   LOCATION:  5531                         FACILITY:  [facility]   PHYSICIAN:  Valerie A. Asa Lente, MDDATE OF BIRTH:  1928-09-06   DATE OF ADMISSION:  06/11/2008  DATE OF DISCHARGE:  06/12/2008                               DISCHARGE SUMMARY   DISCHARGE DIAGNOSES:  1. Fall, rule out syncope.  Cardiac evaluation negative, suspect      vasovagal.  See details.  2. Dehydration with hypotension and mild acute renal insufficiency on      admission, resolved.  Discharge creatinine 1.1.  3. Oral bleeding with trauma status post gum surgery prior to      admission, now resolved.  Hold aspirin pending outpatient followup      with oral surgeon next week.  4. Anemia, question degree of acute blood loss prior to admission.      Hemoglobin stable during this hospital stay.  Discharge hemoglobin      10.3.  Outpatient followup.  5. Mild thrombocytopenia, stable during this hospitalization.      Platelet count 93,000.  Outpatient followup with primary MD.  6. Chronic dysarthria and expressive aphasia due to stroke, October      2008, currently at baseline.  7. Hypertension.  8. Dyslipidemia.  9. History of coronary artery disease status post coronary artery      bypass grafting in 1995 and stent in 1997.  10.Gastroesophageal reflux disease.  11.Osteoporosis.  12.History of pancreatitis in 2004.  13.History of right hydronephrosis status post ureteral stent in 2005.   Discharge medications include holding aspirin 81 mg daily until next  week with followup with oral surgeon.  Other medications are as prior to  admission and include:  1. Cardura 4 mg p.o. daily.  2. Bystolic 5 mg p.o. daily.  3. Ranitidine 75 mg tablet once daily.  4. Fosamax 70 mg once weekly.  5. Multivitamin once daily.  6. Crestor 10 mg daily.  7. Lexapro 10 mg daily.  8. Metronidazole 0.15% lotion to his  face daily.   DISPOSITION:  The patient is discharged home where he lives with his  wife.  He feels well, has no complaints, and both he and his wife are  anxious for discharge to home.  He has been hemodynamically stable  during the time of this observation and hospitalization.  Telemetry has  been negative.  Cardiac enzymes have been negative.  Hospital followup  will be next week with oral surgeon, Dr. Edsel Petrin as previously  scheduled, also with Dr. Arnoldo Morale as scheduled for later this month.  The  patient and family instructed to call if problems prior to this time for  further discussion with Dr. Arnoldo Morale as needed.   HOSPITAL COURSE:  Fall versus syncope.  The patient is a 77 year old  gentleman with multiple chronic medical issues, who underwent recent  oral surgery with a gum graft replacement, who has been having a  decreased amount of intake at home.  He fell on the day of admission.  It was unclear  if this was an accidental fall versus unprovoked loss of  consciousness but with the fall, he did seem to have bleeding from the  mouth where the surgery had occurred.  The bleeding did not seem to stop  easily so they came to the emergency room for further evaluation.  There, the bleeding was stopped spontaneously without specific  intervention but he was noted to be hypotensive down systolic in the [id number]  and given IV fluids.  Because of the bleeding and hypotension, he was  referred for admission and further evaluation of fall versus syncope.  He was treated with IV fluids for his associated dehydration, evidenced  by mild renal insufficiency, creatinine 1.5 down to 1.1, status post  fluid hydration.  He has had no further episodes of hypotension.  Systolic pressures were in the ranging 120s to 140s during this stay,  and is felt safe to resume his home medications as prior to admission.  His hemoglobin has remained stable in the mid 10s, and it was noted that  he had mild  thrombocytopenia, but not with significant change during  this hospitalization.  As there were no further episodes of bleeding,  ongoing cardiac issues, or hemodynamic instability, he was felt safe to  discharge home as he and his wife were anxious to do.  I have instructed  them to hold the baby aspirin until followup with her oral surgeon next  week and they contact her primary care physician, Dr. Arnoldo Morale if further  bleeding or problems prior to their scheduled appointment at the end of  the month.  Family agrees to do so.  It is safe to resume their home  medications as previously scheduled as he is again hemodynamically  stable and no other acute abnormalities have been identified.      Valerie A. Asa Lente, MD  Electronically Signed     VAL/MEDQ  D:  06/12/2008  T:  06/13/2008  Job:  [id number]

## 2010-07-15 ENCOUNTER — Other Ambulatory Visit: Payer: Self-pay | Admitting: Internal Medicine

## 2010-07-15 NOTE — Consult Note (Signed)
NAME:  [name], [name] NO.:  [id number]   MEDICAL RECORD NO.:  [id number]                   PATIENT TYPE:  ORB   LOCATION:  [id number]                                 FACILITY:  [facility]   PHYSICIAN:  Gatha Mayer, M.D. [facility]           DATE OF BIRTH:  1928/03/24   DATE OF CONSULTATION:  03/02/2003  DATE OF DISCHARGE:  03/05/2002                                   CONSULTATION   REFERRING PHYSICIAN:  Meredith Staggers, M.D.   REASON FOR CONSULTATION:  Diarrhea.   HISTORY:  This is a 77 year old white man known to me from previous problems  with pancreatitis and pseudocyst, as well as C. diff diarrhea.  He was  transferred to [facility] because of persistent problems  with the above, i.e., pancreatitis and pseudocyst, as well as a duodenal  stricture.  Dr. Henrene Hawking performed a cholesterol and J-tube placement  on January 20, 2003.  He had some medical problems, was in and out of the  ICU.  He had diarrhea, found to be Clostridium difficile, and was started on  Flagyl and vancomycin.  He is now on Peptamen tube feeds at 90 cc an hour  continuously.  He is improving as far as his physical status with the  physical therapy.  He is having about three bowel movements in a day now  which is an improvement as he was having them nocturnally before.  They are  very small, relatively loose bowel movements.  He denies any particular  abdominal pain.   MEDICATIONS:  1. Multivitamin.  2. Warfarin.  3. Flagyl.  4. Ranitidine.  5. Metoprolol.  6. Vancomycin p.o.  7. Ritalin.  8. Celexa.  9. Citrucel.  10.      Mylanta.   ALLERGIES:  No known drug allergies.   PAST MEDICAL HISTORY:  1. As above.  2. He had an upper extremity thrombosis which necessitated his Coumadin.  3. Dyslipidemia.  4. Compression fractures.  5. Depression.   FAMILY HISTORY:  Unchanged from previous notes.   SOCIAL HISTORY:  Unchanged from previous notes.  He is  married.  He uses  some alcohol.  He is retired.   PHYSICAL EXAMINATION:  GENERAL:  A thin, well-developed, elderly white male  in no acute distress.  VITAL SIGNS:  Temperature 97.6, blood pressure 148/85, pulse 90.  NECK:  Supple.  LUNGS:  Clear.  HEART:  S1 and S2, no murmurs or gallops.  ABDOMEN:  Soft, nontender, no organomegaly, masses, J-tube in place.  Surgical scar is noted.  EXTREMITIES:  No edema.  NEUROLOGIC:  Alert and oriented x3.   LABORATORY DATA:  PT 20.5, INR 2.3, bilirubin 0.5, alkaline phosphatase 228,  SGOT 38, SGPT 50.  White count 7, hemoglobin 8.9, hematocrit 26%, platelets  340.   Electrolytes on February 27, 2003, were normal.  Glucose was 114, creatinine  normal.  C. diff  toxin is negative here.  Single view abdomen showed no  particular abnormalities.   ASSESSMENT:  1. Diarrhea, probably related to tube feeds, Clostridium difficile, and     perhaps some of his liquid medications, and recent cholecystectomy.  It     is improving.  2. Duodenal stricture stenosis.  He is on liquids at this point.  Question     if this will heal versus needing surgery.  This is not yet determined.  3. Pancreatitis with pseudocyst.  4. Recent deep vein thrombosis, right upper extremity.  5. Deconditioning, debilitation from his medical illnesses and surgeries.   RECOMMENDATIONS AND PLAN:  1. Complete the Flagyl, continue vancomycin for two weeks and then taper.  2. I will see him back in the office on March 18, 2003, at which point we     can start to taper his vancomycin.  3. Add Questran, stop Mylanta and Citrucel.  4. Continue Peptamen.  5. The patient has expressed some interest in remaining in [place] for     his surgery, so we will coordinate followup with Dr. Fanny Skates.  We     will try to do that as an inpatient if possible.  If not, we could set     that up as an outpatient.  6. At some point, he is going to need repeat x-rays to follow up his      problems.   I appreciate the opportunity to care for this patient.                                               Gatha Mayer, M.D. [facility]    CEG/MEDQ  D:  03/02/2003  T:  03/02/2003  Job:  [id number]   cc:   Ricard Dillon, M.D. [facility]. Dalbert Batman, M.D.  [id number] [place]  [place] [address]  Fax: [phone]

## 2010-07-15 NOTE — Op Note (Signed)
NAMEAMYAS, ERDMANN NO.:  [id number]   MEDICAL RECORD NO.:  [id number]          PATIENT TYPE:  INP   LOCATION:  [place]                         FACILITY:  [facility]   PHYSICIAN:  Edsel Petrin. Dalbert Batman, M.D.DATE OF BIRTH:  10-Aug-1928   DATE OF PROCEDURE:  01/25/2005  DATE OF DISCHARGE:                                 OPERATIVE REPORT   PREOPERATIVE DIAGNOSIS:  Ventral incisional hernia.   POSTOPERATIVE DIAGNOSIS:  Ventral incisional hernia.   OPERATION:  Open repair of ventral incisional hernia with inlay proceed  mesh.   SURGEON:  Edsel Petrin. Dalbert Batman, M.D.   FIRST ASSISTANT:  Haywood Lasso, M.D.   OPERATIVE INDICATIONS:  This is a 77 year old white man who has a  significant past history. He underwent a right retroperitoneal exploration  and resection and repair of his right ureter by Dr. Lawerance Bach on May 11, 2004. This postop course was complicated by a staph infection and ultimately  grew out MRSA. He developed a progressive bulge in the right side of his  abdomen and on exam and by CT he has a complicated ventral incisional hernia  between the external oblique and the internal oblique in the lateral  abdominal wall on the right. This is a very large hernia. He also has a  significant past history for pancreatitis with pancreatic pseudocyst  requiring a laparoscopic cholecystectomy and laparoscopic jejunostomy tube  placement at [facility]. He had infection and complications at the jejunostomy  tube site but that is now healed and he is actually feeling quite good. He  is distressed by the progression of the size of his ventral hernia and its  appearance and is brought to the operating room electively.   OPERATIVE FINDINGS:  The patient had a large ventral incisional hernia on  the right side of the abdomen lateral to the rectus sheath. This contained  colon and small bowel and a hernia sac which was between the external  oblique and internal oblique. This  looked too lateral to be a true spigelian  hernia and it was too large, so I felt that this was more likely due to his  incision. The small intestine and large intestine actually looked quite good  and there were very few adhesions within the abdominal cavity.   OPERATIVE TECHNIQUE:  Following the induction of general endotracheal  anesthesia, a Foley catheter was placed. Intravenous vancomycin was given.  The abdomen was prepped and draped in a sterile fashion.   The patient's incision on the right side of his abdomen was a curved  vertically oriented incision with the concavity toward the midline. We  ultimately opened all of this incision and then extended it inferiorly and  medially some. Dissection was carried down through the subcutaneous tissue  and then we identified a very thinned out external oblique in the right mid  and right lower quadrant. We opened up the external oblique and entered a  space where there was a hernia sac. We then entered the hernia sac  atraumatically and entered the peritoneal space. There was no incarceration  of the  bowel contents. We trimmed back the hernia sac and then began  exploring the wound.   We found that the patient had a very large defect but we could find the rim  all the way around. Laterally, it went almost all the way to the anterior  superior iliac spine, superiorly it went into the right upper quadrant. We  opened up the external oblique vertically to expose this. We debrided the  hernia sac away. We undermined the tissues to set up a repair.   I chose to use an 8 inch x 12 inch piece of proceed composite mesh. I was  careful to place the clear white surface toward the bowel and the blue  striped surface toward the fascia to promote tissue ingrowth. I had to trim  the mesh into an elliptical shape. I then placed the mesh inside the  abdominal cavity and sewed it in place with interrupted mattress sutures of  zero and #1 Novofil. I  started with the sutures most inferiorly and tied  this down and then took sutures of the medial and lateral sides being  careful to make sure that the space was closed down completely so there  would be no defect in the mesh. I was also careful to make sure that I  trimmed the mesh so that there was no redundancy of the mesh that  could  turn underneath and adhere to the bowel. I carried this closure superiorly  both on the medial and lateral segments. When I got to the upper third, I  placed all the sutures and held them with hemostats before tying them down.  I then examined the abdominal cavity, everything looked fine. I made sure  that we removed all the laparotomy pads and the retractors and returned the  omentum to its anatomic position. I then one by one lifted up on the final  sutures and made sure that the mesh was secure and without any defect and  then we tied all the sutures down. I irrigated out this wound quite  copiously with saline. I once again inspected all the way around the under  rim and found no holes or gaps in the repair. I closed the external oblique  over the mesh with a running suture of #1 PDS. I placed a 19-French Blake  drain in the subcutaneous tissue, I  brought it out through a separate stab  incision superiorly, sewed the drain to the skin with a nylon suture and  connected it to a suction bulb. The skin was then closed with skin staples.  The subcutaneous tissue was so thin I did not think that an extra layer of  closure was necessary there. Clean bandages were placed and the patient  taken to the recovery room in stable condition. Estimated blood loss was  about 75 mL. Complications none. Sponge, needle and instrument counts were  correct.      Edsel Petrin. Dalbert Batman, M.D.  Electronically Signed     HMI/MEDQ  D:  01/25/2005  T:  01/26/2005  Job:  [id number]   cc:   Jori Moll L. Rosana Hoes, M.D.  Fax: [phone]   Ricard Dillon, M.D. [facility] [address]  [place]  [place] [address]   Gatha Mayer, M.D. [place]  [place], [place] [address]

## 2010-08-16 ENCOUNTER — Other Ambulatory Visit: Payer: Self-pay | Admitting: Internal Medicine

## 2010-08-24 ENCOUNTER — Other Ambulatory Visit: Payer: Self-pay | Admitting: Internal Medicine

## 2010-09-09 ENCOUNTER — Other Ambulatory Visit: Payer: Self-pay | Admitting: Internal Medicine

## 2010-09-17 ENCOUNTER — Other Ambulatory Visit: Payer: Self-pay | Admitting: Internal Medicine

## 2010-09-19 ENCOUNTER — Ambulatory Visit (INDEPENDENT_AMBULATORY_CARE_PROVIDER_SITE_OTHER): Payer: Medicare Other | Admitting: Internal Medicine

## 2010-09-19 ENCOUNTER — Encounter: Payer: Self-pay | Admitting: Internal Medicine

## 2010-09-19 ENCOUNTER — Ambulatory Visit: Payer: Medicare Other | Admitting: Internal Medicine

## 2010-09-19 VITALS — BP 126/60 | HR 68 | Temp 98.0°F | Resp 16 | Ht 72.0 in | Wt 202.0 lb

## 2010-09-19 DIAGNOSIS — E785 Hyperlipidemia, unspecified: Secondary | ICD-10-CM

## 2010-09-19 DIAGNOSIS — I10 Essential (primary) hypertension: Secondary | ICD-10-CM

## 2010-09-19 DIAGNOSIS — D500 Iron deficiency anemia secondary to blood loss (chronic): Secondary | ICD-10-CM

## 2010-09-19 DIAGNOSIS — I2510 Atherosclerotic heart disease of native coronary artery without angina pectoris: Secondary | ICD-10-CM

## 2010-09-19 MED ORDER — VITAMIN D 1000 UNITS PO CAPS: 1000.0000 [IU] | ORAL_CAPSULE | Freq: Every day | ORAL | Status: DC

## 2010-09-19 NOTE — Progress Notes (Signed)
  Subjective:    [name], male    DOB: [birth year], 77 y.o.   MRN: [id number]  HPI Patient presents for followup of fracture of his stroke.  His blood pressure and gastroesophageal reflux pretty well controlled we are following up with monitoring of his lipids and basic metabolic panel he has a history also of iron deficiency anemia probably secondary to the use of anticoagulants in the setting of stroke.  He is speech remains stable with expressive aphasia but able to understand commands and follow them he seems to be alert and oriented as well as he seems to be pleased at his current status and progression.  He is seeking to resume driving and has completed her driver's course   Review of Systems  Constitutional: Negative for fever and fatigue.  HENT: Negative for hearing loss, congestion, neck pain and postnasal drip.   Eyes: Negative for discharge, redness and visual disturbance.  Respiratory: Negative for cough, shortness of breath and wheezing.   Cardiovascular: Negative for leg swelling.  Gastrointestinal: Negative for abdominal pain, constipation and abdominal distention.  Genitourinary: Negative for urgency and frequency.  Musculoskeletal: Negative for joint swelling and arthralgias.  Skin: Negative for color change and rash.  Neurological: Negative for weakness and light-headedness.  Hematological: Negative for adenopathy.  Psychiatric/Behavioral: Negative for behavioral problems.   , Past Medical History  Diagnosis Date  . GERD (gastroesophageal reflux disease)   . Hyperlipidemia   . Hypertension   . CAD (coronary artery disease)    Past Surgical History  Procedure Date  . Coronary artery bypass graft     x 5  . Tonsillectomy   . Band hemorrhoidectomy   . Dental implants     reports that he has quit smoking. He does not have any smokeless tobacco history on file. He reports that he does not drink alcohol or use illicit drugs. family history  includes Coronary artery disease in an unspecified family member; Heart attack in an unspecified family member; and Heart disease in his father. Allergies  Allergen Reactions  . Sulfamethoxazole W/Trimethoprim        Objective:   Physical Exam  Nursing note and vitals reviewed. Constitutional: He appears well-developed and well-nourished.  HENT:  Head: Normocephalic and atraumatic.  Eyes: Conjunctivae are normal. Pupils are equal, round, and reactive to light.  Neck: Normal range of motion. Neck supple.  Cardiovascular: Normal rate and regular rhythm.   Pulmonary/Chest: Effort normal and breath sounds normal.  Abdominal: Soft. Bowel sounds are normal.          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  He still has a high level of functioning can care for himself dress himself and is now able to drive pending full evaluation by the state of [place] granting him daytime supervised driving rights.  We'll monitor him for his iron deficiency anemia his hypertension and his hyperlipidemia medications were reviewed and refilled as necessary discussion with his wife of this care plan and his driving she concurs that she believes he is able to drive him daytime short distances we discussed the risk of his dysarthria and how someone must be in the car to communicate for him

## 2010-09-22 ENCOUNTER — Other Ambulatory Visit: Payer: Self-pay | Admitting: Internal Medicine

## 2010-10-01 ENCOUNTER — Other Ambulatory Visit: Payer: Self-pay | Admitting: Internal Medicine

## 2010-10-18 ENCOUNTER — Other Ambulatory Visit: Payer: Self-pay | Admitting: Internal Medicine

## 2010-10-27 ENCOUNTER — Other Ambulatory Visit: Payer: Self-pay | Admitting: Internal Medicine

## 2010-11-02 ENCOUNTER — Other Ambulatory Visit: Payer: Self-pay | Admitting: Internal Medicine

## 2010-11-28 ENCOUNTER — Other Ambulatory Visit: Payer: Self-pay | Admitting: Internal Medicine

## 2010-12-06 LAB — BASIC METABOLIC PANEL
BUN: 22
BUN: 26 — ABNORMAL HIGH
Calcium: 8.6
Chloride: 100
Chloride: 98
Creatinine, Ser: 1.25
GFR calc Af Amer: >60
GFR calc Af Amer: >60
GFR calc non Af Amer: 56 — ABNORMAL LOW
GFR calc non Af Amer: 58 — ABNORMAL LOW
GFR calc non Af Amer: >60
GFR calc non Af Amer: >60
Glucose, Bld: 114 — ABNORMAL HIGH
Glucose, Bld: 127 — ABNORMAL HIGH
Potassium: 4.4
Potassium: 4.5
Sodium: 135
Sodium: 135

## 2010-12-06 LAB — COMPREHENSIVE METABOLIC PANEL
Albumin: 2.3 — ABNORMAL LOW
BUN: 25 — ABNORMAL HIGH
Chloride: 102
Creatinine, Ser: 1.14
GFR calc non Af Amer: >60
Glucose, Bld: 126 — ABNORMAL HIGH
Total Bilirubin: 0.6

## 2010-12-06 LAB — PREALBUMIN
Prealbumin: 23
Prealbumin: 25.7

## 2010-12-07 LAB — DIFFERENTIAL
Basophils Absolute: 0
Basophils Absolute: 0
Basophils Absolute: 0
Basophils Relative: 0
Eosinophils Absolute: 0.1
Eosinophils Relative: 2
Eosinophils Relative: 2
Lymphocytes Relative: 12
Monocytes Absolute: 0.5
Monocytes Absolute: 0.5
Monocytes Absolute: 0.6
Neutro Abs: 4.6
Neutro Abs: 5.1

## 2010-12-07 LAB — COMPREHENSIVE METABOLIC PANEL
ALT: 19
AST: 21
AST: 22
Albumin: 2.3 — ABNORMAL LOW
Albumin: 2.6 — ABNORMAL LOW
Albumin: 3.6
Alkaline Phosphatase: 51
BUN: 13
BUN: 13
CO2: 25
Chloride: 104
Chloride: 108
Chloride: 99
Creatinine, Ser: 1.09
GFR calc Af Amer: >60
GFR calc non Af Amer: 54 — ABNORMAL LOW
Potassium: 3.4 — ABNORMAL LOW
Potassium: 4.3
Sodium: 138
Total Bilirubin: 0.4
Total Bilirubin: 0.5
Total Bilirubin: 0.6
Total Protein: 5.8 — ABNORMAL LOW

## 2010-12-07 LAB — BLOOD GAS, ARTERIAL
Acid-base deficit: 2.1 — ABNORMAL HIGH
Acid-base deficit: 2.8 — ABNORMAL HIGH
Acid-base deficit: 7.6 — ABNORMAL HIGH
Bicarbonate: 16.4 — ABNORMAL LOW
Bicarbonate: 20.4
Bicarbonate: 21.7
FIO2: 0.3
FIO2: 0.8
MECHVT: 600
MECHVT: 600
O2 Saturation: 100
O2 Saturation: 98.8
PEEP: 5
Patient temperature: 100.6
Patient temperature: 97.4
RATE: 14
TCO2: 17.3
TCO2: 22.7
pCO2 arterial: 30.2 — ABNORMAL LOW
pO2, Arterial: 111 — ABNORMAL HIGH

## 2010-12-07 LAB — BASIC METABOLIC PANEL
BUN: 14
BUN: 14
BUN: 8
CO2: 18 — ABNORMAL LOW
CO2: 18 — ABNORMAL LOW
CO2: 20
Calcium: 7.8 — ABNORMAL LOW
Calcium: 8.1 — ABNORMAL LOW
Chloride: 101
Chloride: 101
Chloride: 110
Chloride: 118 — ABNORMAL HIGH
Creatinine, Ser: 1.17
Creatinine, Ser: 1.17
GFR calc Af Amer: >60
GFR calc Af Amer: >60
GFR calc non Af Amer: >60
Glucose, Bld: 110 — ABNORMAL HIGH
Glucose, Bld: 111 — ABNORMAL HIGH
Glucose, Bld: 129 — ABNORMAL HIGH
Glucose, Bld: 95
Potassium: 3.4 — ABNORMAL LOW
Potassium: 3.8
Potassium: 4.4
Sodium: 138

## 2010-12-07 LAB — CBC
HCT: 32.5 — ABNORMAL LOW
HCT: 33.4 — ABNORMAL LOW
HCT: 33.9 — ABNORMAL LOW
Hemoglobin: 11.6 — ABNORMAL LOW
MCHC: 34.3
MCV: 94.2
MCV: 95.6
MCV: 95.7
Platelets: 166
Platelets: 220
RBC: 3.31 — ABNORMAL LOW
RBC: 3.49 — ABNORMAL LOW
RBC: 3.55 — ABNORMAL LOW
RDW: 12.8
RDW: 12.8
WBC: 4.7
WBC: 6.2

## 2010-12-07 LAB — CLOSTRIDIUM DIFFICILE EIA

## 2010-12-07 LAB — HEMOGLOBIN A1C
Hgb A1c MFr Bld: 5.9
Mean Plasma Glucose: 133

## 2010-12-07 LAB — I-STAT 8, (EC8 V) (CONVERTED LAB)
Acid-base deficit: 2
Chloride: 105
HCT: 35 — ABNORMAL LOW
Operator id: 234501
TCO2: 23
pCO2, Ven: 31.2 — ABNORMAL LOW

## 2010-12-07 LAB — CULTURE, BLOOD (ROUTINE X 2): Culture: NO GROWTH

## 2010-12-07 LAB — ETHANOL: Alcohol, Ethyl (B): 48 — ABNORMAL HIGH

## 2010-12-07 LAB — HOMOCYSTEINE: Homocysteine: 11.6

## 2010-12-07 LAB — PHOSPHORUS: Phosphorus: 2.4

## 2010-12-07 LAB — POCT CARDIAC MARKERS
CKMB, poc: 1.7
Myoglobin, poc: 123
Troponin i, poc: <0.05

## 2010-12-07 LAB — LIPID PANEL
Triglycerides: 47
VLDL: 9

## 2010-12-07 LAB — MAGNESIUM: Magnesium: 1.8

## 2010-12-07 LAB — PROTIME-INR: Prothrombin Time: 14

## 2010-12-07 LAB — POCT I-STAT CREATININE: Operator id: 234501

## 2010-12-13 ENCOUNTER — Other Ambulatory Visit: Payer: Self-pay | Admitting: Internal Medicine

## 2010-12-22 ENCOUNTER — Ambulatory Visit: Payer: Medicare Other | Admitting: Internal Medicine

## 2010-12-26 ENCOUNTER — Other Ambulatory Visit: Payer: Self-pay | Admitting: Internal Medicine

## 2010-12-29 ENCOUNTER — Other Ambulatory Visit: Payer: Self-pay | Admitting: Internal Medicine

## 2011-01-09 ENCOUNTER — Ambulatory Visit (INDEPENDENT_AMBULATORY_CARE_PROVIDER_SITE_OTHER): Payer: Medicare Other | Admitting: Internal Medicine

## 2011-01-09 ENCOUNTER — Encounter: Payer: Self-pay | Admitting: Internal Medicine

## 2011-01-09 VITALS — BP 140/80 | HR 76 | Temp 98.2°F | Resp 16 | Ht 72.0 in | Wt 201.0 lb

## 2011-01-09 DIAGNOSIS — R413 Other amnesia: Secondary | ICD-10-CM

## 2011-01-09 DIAGNOSIS — I6990 Unspecified sequelae of unspecified cerebrovascular disease: Secondary | ICD-10-CM

## 2011-01-09 DIAGNOSIS — I10 Essential (primary) hypertension: Secondary | ICD-10-CM

## 2011-01-09 DIAGNOSIS — Z23 Encounter for immunization: Secondary | ICD-10-CM

## 2011-01-09 MED ORDER — ROSUVASTATIN CALCIUM 10 MG PO TABS: 5.0000 mg | ORAL_TABLET | Freq: Every day | ORAL | Status: DC

## 2011-01-09 NOTE — Progress Notes (Signed)
  Subjective:    [name], male    DOB: 10-06-1928, 77 y.o.   MRN: [id number]  HPI Follow up of HTN and late effect of the CVA with dysarthria and hemiparesis. Stable blood pressure No current symptoms Needs samples of medications Needs a flu shot Now driving    Review of Systems  Constitutional: Negative for fever and fatigue.  HENT: Negative for hearing loss, congestion, neck pain and postnasal drip.   Eyes: Negative for discharge, redness and visual disturbance.  Respiratory: Negative for cough, shortness of breath and wheezing.   Cardiovascular: Negative for leg swelling.  Gastrointestinal: Negative for abdominal pain, constipation and abdominal distention.  Genitourinary: Negative for urgency and frequency.  Musculoskeletal: Negative for joint swelling and arthralgias.  Skin: Negative for color change and rash.  Neurological: Negative for weakness and light-headedness.  Hematological: Negative for adenopathy.  Psychiatric/Behavioral: Negative for behavioral problems.   Past Medical History  Diagnosis Date  . GERD (gastroesophageal reflux disease)   . Hyperlipidemia   . Hypertension   . CAD (coronary artery disease)    Past Surgical History  Procedure Date  . Coronary artery bypass graft     x 5  . Tonsillectomy   . Band hemorrhoidectomy   . Dental implants     reports that he has quit smoking. He does not have any smokeless tobacco history on file. He reports that he does not drink alcohol or use illicit drugs. family history includes Coronary artery disease in an unspecified family member; Heart attack in an unspecified family member; and Heart disease in his father. Allergies  Allergen Reactions  . Sulfamethoxazole W/Trimethoprim         Objective:   Physical Exam  Vitals reviewed. Constitutional: He appears well-developed and well-nourished.  HENT:  Head: Normocephalic and atraumatic.  Eyes: Conjunctivae are normal. Pupils are equal,  round, and reactive to light.  Neck: Normal range of motion. Neck supple.  Cardiovascular: Normal rate and regular rhythm.   Pulmonary/Chest: Effort normal and breath sounds normal.  Abdominal: Soft. Bowel sounds are normal.          Assessment & Plan:  Stable blood pressure Memory disorder stable Flu shot given Lipids stable refill medications

## 2011-01-25 ENCOUNTER — Other Ambulatory Visit: Payer: Self-pay | Admitting: Internal Medicine

## 2011-02-02 ENCOUNTER — Other Ambulatory Visit: Payer: Self-pay | Admitting: Internal Medicine

## 2011-02-23 ENCOUNTER — Other Ambulatory Visit: Payer: Self-pay | Admitting: Internal Medicine

## 2011-03-01 ENCOUNTER — Other Ambulatory Visit: Payer: Self-pay | Admitting: Internal Medicine

## 2011-04-04 ENCOUNTER — Other Ambulatory Visit: Payer: Self-pay | Admitting: Internal Medicine

## 2011-04-09 ENCOUNTER — Other Ambulatory Visit: Payer: Self-pay | Admitting: Internal Medicine

## 2011-04-10 ENCOUNTER — Telehealth: Payer: Self-pay | Admitting: *Deleted

## 2011-04-10 MED ORDER — ESCITALOPRAM OXALATE 20 MG PO TABS: 20.0000 mg | ORAL_TABLET | Freq: Every day | ORAL | Status: DC

## 2011-04-10 NOTE — Telephone Encounter (Signed)
refilll meds

## 2011-04-25 ENCOUNTER — Other Ambulatory Visit: Payer: Self-pay | Admitting: *Deleted

## 2011-04-25 MED ORDER — NEBIVOLOL HCL 5 MG PO TABS: 5.0000 mg | ORAL_TABLET | Freq: Every day | ORAL | Status: DC

## 2011-05-09 ENCOUNTER — Ambulatory Visit (INDEPENDENT_AMBULATORY_CARE_PROVIDER_SITE_OTHER): Payer: Medicare Other | Admitting: Internal Medicine

## 2011-05-09 ENCOUNTER — Encounter: Payer: Self-pay | Admitting: Internal Medicine

## 2011-05-09 VITALS — BP 140/80 | HR 72 | Temp 98.2°F | Resp 16 | Ht 72.0 in | Wt 203.0 lb

## 2011-05-09 DIAGNOSIS — M76899 Other specified enthesopathies of unspecified lower limb, excluding foot: Secondary | ICD-10-CM

## 2011-05-09 DIAGNOSIS — M7072 Other bursitis of hip, left hip: Secondary | ICD-10-CM

## 2011-05-09 DIAGNOSIS — R609 Edema, unspecified: Secondary | ICD-10-CM

## 2011-05-09 DIAGNOSIS — I10 Essential (primary) hypertension: Secondary | ICD-10-CM

## 2011-05-09 MED ORDER — METHYLPREDNISOLONE ACETATE PF 40 MG/ML IJ SUSP: 40.0000 mg | Freq: Once | INTRAMUSCULAR | Status: DC

## 2011-05-09 MED ORDER — TRIAMTERENE-HCTZ 37.5-25 MG PO TABS: 1.0000 | ORAL_TABLET | Freq: Every morning | ORAL | Status: DC

## 2011-05-09 NOTE — Patient Instructions (Signed)
You have received a steroid injection into a joint space. It will take up to 48 hours before you notice a difference in the pain in the joint. For the next few hours keep ice on the site of the injection. Do not exert the injected joint for the next 24 hours.

## 2011-05-09 NOTE — Progress Notes (Signed)
Subjective:    [name], male    DOB: 12/20/1928, 78 y.o.   MRN: [id number]  HPI The patient is an 78 year old white male who has marked dysarthria from prior CVA who presents for followup of hyperlipidemia hypertension gastroesophageal reflux and 2 new chief complaints.  He communicates that he has swelling in his right leg below the knee he states it is painless he states that he feels it " blow up" during the day but is better when he wakes in the morning.  He states that it feels tight but there is no pain he does not have a Homans sign on the back leg there is no erythema there heart is however palpable edema that is 2+ pitting. This is also the side where he had venous harvesting for his bypass surgery.  The second complaint is bursitis in his hip he states that when he rolls over onto his hip it is painful and he is requesting an injection. He did remarkably well since he was by himself today and using short phrases and pantomime to describe his medical problems   Review of Systems  Constitutional: Negative for fever and fatigue.  HENT: Negative for hearing loss, congestion, neck pain and postnasal drip.   Eyes: Negative for discharge, redness and visual disturbance.  Respiratory: Negative for cough, shortness of breath and wheezing.   Cardiovascular: Positive for leg swelling.  Gastrointestinal: Negative for abdominal pain, constipation and abdominal distention.  Genitourinary: Negative for urgency and frequency.  Musculoskeletal: Positive for myalgias and joint swelling. Negative for arthralgias.  Skin: Negative for color change and rash.  Neurological: Negative for weakness and light-headedness.  Hematological: Negative for adenopathy.  Psychiatric/Behavioral: Negative for behavioral problems.       Past Medical History  Diagnosis Date  . GERD (gastroesophageal reflux disease)   . Hyperlipidemia   . Hypertension   . CAD (coronary artery disease)      History   Social History  . Marital Status: Married    Spouse Name: N/A    Number of Children: N/A  . Years of Education: N/A   Occupational History  . Not on file.   Social History Main Topics  . Smoking status: Former Research scientist (life sciences)  . Smokeless tobacco: Not on file  . Alcohol Use: No  . Drug Use: No  . Sexually Active: Not Currently   Other Topics Concern  . Not on file   Social History Narrative  . No narrative on file    Past Surgical History  Procedure Date  . Coronary artery bypass graft     x 5  . Tonsillectomy   . Band hemorrhoidectomy   . Dental implants     Family History  Problem Relation Age of Onset  . Heart attack      fhx  . Coronary artery disease      fhx  . Heart disease Father     Allergies  Allergen Reactions  . Sulfamethoxazole W/Trimethoprim     Current Outpatient Prescriptions on File Prior to Visit  Medication Sig Dispense Refill  . aspirin 81 MG tablet Take 81 mg by mouth daily.        . Cholecalciferol (VITAMIN D) 1000 UNITS capsule Take 2,000 Units by mouth daily.        Marland Kitchen escitalopram (LEXAPRO) 20 MG tablet Take 1 tablet (20 mg total) by mouth daily.  30 tablet  6  . lansoprazole (PREVACID) 15 MG capsule Take 15 mg by mouth  daily.        Marland Kitchen METRONIDAZOLE, TOPICAL, 0.75 % LOTN Apply topically daily.        . Multiple Vitamin (MULTIVITAMIN) tablet Take 1 tablet by mouth daily.        Marland Kitchen NAMENDA 10 MG tablet TAKE ONE (1) TABLET(S) BY MOUTH ONCE A DAY  30 tablet  0  . nebivolol (BYSTOLIC) 5 MG tablet Take 1 tablet (5 mg total) by mouth daily.  30 tablet  11  . rosuvastatin (CRESTOR) 10 MG tablet Take 0.5 tablets (5 mg total) by mouth daily.      . sucralfate (CARAFATE) 1 G tablet Take 1 g by mouth daily.       . Tamsulosin HCl (FLOMAX) 0.4 MG CAPS TAKE ONE (1) CAPSULE(S) ONCE DAILY  30 capsule  0    BP 140/80  Pulse 72  Temp 98.2 F (36.8 C)  Resp 16  Ht 6' (1.829 m)  Wt 203 lb (92.08 kg)  BMI 27.53 kg/m2    Objective:    Physical Exam  Constitutional: He is oriented to person, place, and time. He appears well-developed and well-nourished.  HENT:  Head: Normocephalic and atraumatic.  Eyes: Conjunctivae are normal. Pupils are equal, round, and reactive to light.  Neck: Normal range of motion. Neck supple.  Cardiovascular: Normal rate and regular rhythm.   Pulmonary/Chest: Effort normal and breath sounds normal.  Abdominal: Soft. Bowel sounds are normal.  Musculoskeletal: He exhibits edema and tenderness.  Neurological: He is alert and oriented to person, place, and time.          Assessment & Plan:  The patient has been having edema of the right lower extremity this is the same extremity that he had venous harvesting 4.  Homans sign is negative there is no erythema there is no pain to touch there is no cording present he has diffuse pitting edema from the midcalf to the foot.  On the left side he has moderate edema but no where near as severe.  His left hip is moderately tender with pressure on the trochanter but has full range of motion indicating probable bursitis he is given an injection of Depo-Medrol into the hip.  Patient gave informed consent site was prepped with Betadine 1 cc of lidocaine and a half cc of 80 mg of Depo-Medrol per milliliter was injected into the bursal space patient tolerated the procedure well aftercare instructions given to the patient

## 2011-06-05 ENCOUNTER — Other Ambulatory Visit: Payer: Self-pay | Admitting: *Deleted

## 2011-06-05 MED ORDER — TAMSULOSIN HCL 0.4 MG PO CAPS: 0.4000 mg | ORAL_CAPSULE | Freq: Every day | ORAL | Status: DC

## 2011-07-08 ENCOUNTER — Encounter (HOSPITAL_COMMUNITY): Payer: Self-pay

## 2011-07-08 ENCOUNTER — Emergency Department (HOSPITAL_COMMUNITY): Payer: Medicare Other

## 2011-07-08 ENCOUNTER — Inpatient Hospital Stay (HOSPITAL_COMMUNITY)
Admission: EM | Admit: 2011-07-08 | Discharge: 2011-07-14 | DRG: 871 | Disposition: A | Discharge status: Home Health | Payer: Medicare Other | Attending: Internal Medicine | Admitting: Internal Medicine

## 2011-07-08 DIAGNOSIS — K7200 Acute and subacute hepatic failure without coma: Secondary | ICD-10-CM | POA: Diagnosis present

## 2011-07-08 DIAGNOSIS — I482 Chronic atrial fibrillation, unspecified: Secondary | ICD-10-CM | POA: Diagnosis present

## 2011-07-08 DIAGNOSIS — H6120 Impacted cerumen, unspecified ear: Secondary | ICD-10-CM

## 2011-07-08 DIAGNOSIS — K219 Gastro-esophageal reflux disease without esophagitis: Secondary | ICD-10-CM | POA: Diagnosis present

## 2011-07-08 DIAGNOSIS — I498 Other specified cardiac arrhythmias: Secondary | ICD-10-CM | POA: Diagnosis present

## 2011-07-08 DIAGNOSIS — R197 Diarrhea, unspecified: Secondary | ICD-10-CM

## 2011-07-08 DIAGNOSIS — E785 Hyperlipidemia, unspecified: Secondary | ICD-10-CM | POA: Diagnosis present

## 2011-07-08 DIAGNOSIS — I10 Essential (primary) hypertension: Secondary | ICD-10-CM | POA: Diagnosis present

## 2011-07-08 DIAGNOSIS — E876 Hypokalemia: Secondary | ICD-10-CM | POA: Diagnosis present

## 2011-07-08 DIAGNOSIS — I2510 Atherosclerotic heart disease of native coronary artery without angina pectoris: Secondary | ICD-10-CM | POA: Diagnosis present

## 2011-07-08 DIAGNOSIS — K759 Inflammatory liver disease, unspecified: Secondary | ICD-10-CM

## 2011-07-08 DIAGNOSIS — I69959 Hemiplegia and hemiparesis following unspecified cerebrovascular disease affecting unspecified side: Secondary | ICD-10-CM

## 2011-07-08 DIAGNOSIS — M7500 Adhesive capsulitis of unspecified shoulder: Secondary | ICD-10-CM

## 2011-07-08 DIAGNOSIS — Z87891 Personal history of nicotine dependence: Secondary | ICD-10-CM

## 2011-07-08 DIAGNOSIS — G929 Unspecified toxic encephalopathy: Secondary | ICD-10-CM | POA: Diagnosis present

## 2011-07-08 DIAGNOSIS — A413 Sepsis due to Hemophilus influenzae: Secondary | ICD-10-CM

## 2011-07-08 DIAGNOSIS — R509 Fever, unspecified: Secondary | ICD-10-CM

## 2011-07-08 DIAGNOSIS — Z87448 Personal history of other diseases of urinary system: Secondary | ICD-10-CM

## 2011-07-08 DIAGNOSIS — D500 Iron deficiency anemia secondary to blood loss (chronic): Secondary | ICD-10-CM | POA: Diagnosis present

## 2011-07-08 DIAGNOSIS — D696 Thrombocytopenia, unspecified: Secondary | ICD-10-CM | POA: Diagnosis present

## 2011-07-08 DIAGNOSIS — G928 Other toxic encephalopathy: Secondary | ICD-10-CM | POA: Diagnosis present

## 2011-07-08 DIAGNOSIS — I480 Paroxysmal atrial fibrillation: Secondary | ICD-10-CM

## 2011-07-08 DIAGNOSIS — N401 Enlarged prostate with lower urinary tract symptoms: Secondary | ICD-10-CM

## 2011-07-08 DIAGNOSIS — A498 Other bacterial infections of unspecified site: Secondary | ICD-10-CM | POA: Diagnosis present

## 2011-07-08 DIAGNOSIS — L570 Actinic keratosis: Secondary | ICD-10-CM

## 2011-07-08 DIAGNOSIS — F0390 Unspecified dementia without behavioral disturbance: Secondary | ICD-10-CM | POA: Diagnosis present

## 2011-07-08 DIAGNOSIS — E782 Mixed hyperlipidemia: Secondary | ICD-10-CM

## 2011-07-08 DIAGNOSIS — F325 Major depressive disorder, single episode, in full remission: Secondary | ICD-10-CM | POA: Diagnosis present

## 2011-07-08 DIAGNOSIS — E871 Hypo-osmolality and hyponatremia: Secondary | ICD-10-CM | POA: Diagnosis present

## 2011-07-08 DIAGNOSIS — N390 Urinary tract infection, site not specified: Secondary | ICD-10-CM | POA: Diagnosis present

## 2011-07-08 DIAGNOSIS — L719 Rosacea, unspecified: Secondary | ICD-10-CM

## 2011-07-08 DIAGNOSIS — A419 Sepsis, unspecified organism: Principal | ICD-10-CM | POA: Diagnosis present

## 2011-07-08 DIAGNOSIS — F329 Major depressive disorder, single episode, unspecified: Secondary | ICD-10-CM | POA: Diagnosis present

## 2011-07-08 DIAGNOSIS — N179 Acute kidney failure, unspecified: Secondary | ICD-10-CM | POA: Diagnosis present

## 2011-07-08 DIAGNOSIS — G92 Toxic encephalopathy: Secondary | ICD-10-CM | POA: Diagnosis present

## 2011-07-08 DIAGNOSIS — F341 Dysthymic disorder: Secondary | ICD-10-CM

## 2011-07-08 DIAGNOSIS — Z951 Presence of aortocoronary bypass graft: Secondary | ICD-10-CM

## 2011-07-08 DIAGNOSIS — I69351 Hemiplegia and hemiparesis following cerebral infarction affecting right dominant side: Secondary | ICD-10-CM | POA: Diagnosis present

## 2011-07-08 DIAGNOSIS — I4891 Unspecified atrial fibrillation: Secondary | ICD-10-CM | POA: Diagnosis present

## 2011-07-08 DIAGNOSIS — R001 Bradycardia, unspecified: Secondary | ICD-10-CM | POA: Diagnosis not present

## 2011-07-08 DIAGNOSIS — F3289 Other specified depressive episodes: Secondary | ICD-10-CM | POA: Diagnosis present

## 2011-07-08 DIAGNOSIS — R7881 Bacteremia: Secondary | ICD-10-CM | POA: Diagnosis present

## 2011-07-08 HISTORY — DX: Personal history of other diseases of urinary system: Z87.448

## 2011-07-08 LAB — COMPREHENSIVE METABOLIC PANEL
Alkaline Phosphatase: 256 U/L — ABNORMAL HIGH (ref 39–117)
BUN: 29 mg/dL — ABNORMAL HIGH (ref 6–23)
CO2: 25 mEq/L (ref 19–32)
Chloride: 90 mEq/L — ABNORMAL LOW (ref 96–112)
Creatinine, Ser: 1.4 mg/dL — ABNORMAL HIGH (ref 0.50–1.35)
GFR calc non Af Amer: 46 mL/min — ABNORMAL LOW (ref >90)
Glucose, Bld: 117 mg/dL — ABNORMAL HIGH (ref 70–99)
Potassium: 3.7 mEq/L (ref 3.5–5.1)
Total Bilirubin: 2.1 mg/dL — ABNORMAL HIGH (ref 0.3–1.2)

## 2011-07-08 LAB — HEPATITIS PANEL, ACUTE
HCV Ab: NEGATIVE
Hep A IgM: NEGATIVE
Hep B C IgM: NEGATIVE

## 2011-07-08 LAB — DIFFERENTIAL
Lymphocytes Relative: 3 % — ABNORMAL LOW (ref 12–46)
Lymphs Abs: 0.2 10*3/uL — ABNORMAL LOW (ref 0.7–4.0)
Monocytes Absolute: 0.1 10*3/uL (ref 0.1–1.0)
Monocytes Relative: 2 % — ABNORMAL LOW (ref 3–12)
Neutro Abs: 6.2 10*3/uL (ref 1.7–7.7)

## 2011-07-08 LAB — URINALYSIS, ROUTINE W REFLEX MICROSCOPIC
Glucose, UA: NEGATIVE mg/dL
Ketones, ur: NEGATIVE mg/dL
Nitrite: NEGATIVE
Protein, ur: >300 mg/dL — AB

## 2011-07-08 LAB — MRSA PCR SCREENING: MRSA by PCR: NEGATIVE

## 2011-07-08 LAB — CORTISOL: Cortisol, Plasma: 64.9 ug/dL

## 2011-07-08 LAB — AMMONIA: Ammonia: 29 umol/L (ref 11–60)

## 2011-07-08 LAB — CBC
HCT: 39.3 % (ref 39.0–52.0)
Hemoglobin: 14.2 g/dL (ref 13.0–17.0)
MCHC: 36.1 g/dL — ABNORMAL HIGH (ref 30.0–36.0)
RBC: 4.55 MIL/uL (ref 4.22–5.81)
WBC: 6.5 10*3/uL (ref 4.0–10.5)

## 2011-07-08 LAB — CARDIAC PANEL(CRET KIN+CKTOT+MB+TROPI): Total CK: 276 U/L — ABNORMAL HIGH (ref 7–232)

## 2011-07-08 LAB — LACTIC ACID, PLASMA: Lactic Acid, Venous: 1.7 mmol/L (ref 0.5–2.2)

## 2011-07-08 LAB — CK: Total CK: 110 U/L (ref 7–232)

## 2011-07-08 MED ORDER — ACETAMINOPHEN 325 MG PO TABS
ORAL_TABLET | ORAL | Status: AC
  Administered 2011-07-08: 650 mg via ORAL
  Filled 2011-07-08: qty 3

## 2011-07-08 MED ORDER — SODIUM CHLORIDE 0.9 % IV BOLUS (SEPSIS)
250.0000 mL | Freq: Once | INTRAVENOUS | Status: AC
  Administered 2011-07-08: 250 mL via INTRAVENOUS

## 2011-07-08 MED ORDER — SODIUM CHLORIDE 0.9 % IV BOLUS (SEPSIS)
500.0000 mL | INTRAVENOUS | Status: DC | PRN
  Administered 2011-07-09: 500 mL via INTRAVENOUS

## 2011-07-08 MED ORDER — ESCITALOPRAM OXALATE 20 MG PO TABS
20.0000 mg | ORAL_TABLET | Freq: Every day | ORAL | Status: DC
  Administered 2011-07-08 – 2011-07-10 (×3): 20 mg via ORAL
  Filled 2011-07-08 (×3): qty 1

## 2011-07-08 MED ORDER — ENOXAPARIN SODIUM 40 MG/0.4ML ~~LOC~~ SOLN
40.0000 mg | SUBCUTANEOUS | Status: DC
  Administered 2011-07-08 – 2011-07-13 (×6): 40 mg via SUBCUTANEOUS
  Filled 2011-07-08 (×7): qty 0.4

## 2011-07-08 MED ORDER — ACETAMINOPHEN 650 MG RE SUPP: 650.0000 mg | Freq: Four times a day (QID) | RECTAL | Status: DC | PRN

## 2011-07-08 MED ORDER — ACETAMINOPHEN 325 MG PO TABS
650.0000 mg | ORAL_TABLET | Freq: Four times a day (QID) | ORAL | Status: DC | PRN
  Administered 2011-07-10: 650 mg via ORAL
  Filled 2011-07-08: qty 2

## 2011-07-08 MED ORDER — ASPIRIN 81 MG PO TABS: 81.0000 mg | ORAL_TABLET | Freq: Every day | ORAL | Status: DC

## 2011-07-08 MED ORDER — ONDANSETRON HCL 4 MG PO TABS: 4.0000 mg | ORAL_TABLET | Freq: Four times a day (QID) | ORAL | Status: DC | PRN

## 2011-07-08 MED ORDER — PANTOPRAZOLE SODIUM 20 MG PO TBEC
20.0000 mg | DELAYED_RELEASE_TABLET | Freq: Every day | ORAL | Status: DC
  Administered 2011-07-08 – 2011-07-14 (×7): 20 mg via ORAL
  Filled 2011-07-08 (×7): qty 1

## 2011-07-08 MED ORDER — SODIUM CHLORIDE 0.9 % IV BOLUS (SEPSIS)
2000.0000 mL | Freq: Once | INTRAVENOUS | Status: AC
  Administered 2011-07-08 (×2): 2000 mL via INTRAVENOUS

## 2011-07-08 MED ORDER — ACETAMINOPHEN 500 MG PO TABS
1000.0000 mg | ORAL_TABLET | Freq: Once | ORAL | Status: AC
  Administered 2011-07-08: 650 mg via ORAL
  Filled 2011-07-08: qty 2

## 2011-07-08 MED ORDER — ASPIRIN EC 81 MG PO TBEC
81.0000 mg | DELAYED_RELEASE_TABLET | Freq: Every day | ORAL | Status: DC
  Administered 2011-07-08 – 2011-07-14 (×7): 81 mg via ORAL
  Filled 2011-07-08 (×7): qty 1

## 2011-07-08 MED ORDER — DEXTROSE 5 % IV SOLN
1.0000 g | INTRAVENOUS | Status: DC
  Administered 2011-07-08: 1 g via INTRAVENOUS
  Filled 2011-07-08: qty 10

## 2011-07-08 MED ORDER — SODIUM CHLORIDE 0.9 % IJ SOLN
3.0000 mL | Freq: Two times a day (BID) | INTRAMUSCULAR | Status: DC
  Administered 2011-07-08 – 2011-07-14 (×6): 3 mL via INTRAVENOUS

## 2011-07-08 MED ORDER — SODIUM CHLORIDE 0.9 % IV SOLN
INTRAVENOUS | Status: DC
  Administered 2011-07-10: 11:00:00 via INTRAVENOUS

## 2011-07-08 MED ORDER — ONDANSETRON HCL 4 MG/2ML IJ SOLN: 4.0000 mg | Freq: Four times a day (QID) | INTRAMUSCULAR | Status: DC | PRN

## 2011-07-08 NOTE — ED Notes (Signed)
Pt not acting right this am per family.  Pt has deficits from previous stroke. Does not answer questions appropriately.  Skin hot to touch.

## 2011-07-08 NOTE — ED Notes (Signed)
Pt and wife attempting to get Urine specimen

## 2011-07-08 NOTE — Plan of Care (Signed)
Problem: Phase I Progression Outcomes Goal: Voiding-avoid urinary catheter unless indicated Outcome: Not Progressing Pt has foley cath inserted

## 2011-07-08 NOTE — H&P (Signed)
[facility] History and Physical  [name] [id number] DOB: Nov 04, 1928 DOA: 07/08/2011   PCP: [name], MD, MD   Chief Complaint: confused  HPI:  78 yo man with MMP, including hx of CVA, CABG, depression, brought to the ED after wife noted he was too weak to get OOB. He is currently alert but i am unable to understand what he is saying. He does not c/o anything, but in the ED his BP is low and T is 104. Urine is dark. Wife also noted he vomited once today.  Review of Systems:  Per wife he acted normally 24 hrs prior to admission. Patient denies any pain - again unreliable ROS  Past Medical History  Diagnosis Date  . GERD (gastroesophageal reflux disease)   . Hyperlipidemia   . Hypertension   . CAD (coronary artery disease)    Past Surgical History  Procedure Date  . Coronary artery bypass graft     x 5  . Tonsillectomy   . Band hemorrhoidectomy   . Dental implants   . Abdominal surgery    Social History:  reports that he has quit smoking. He does not have any smokeless tobacco history on file. He reports that he does not drink alcohol or use illicit drugs.  Allergies  Allergen Reactions  . Ciprofloxacin Hcl   . Sulfamethoxazole W-Trimethoprim     Family History  Problem Relation Age of Onset  . Heart attack      fhx  . Coronary artery disease      fhx  . Heart disease Father     Prior to Admission medications   Medication Sig Start Date End Date Taking? Authorizing Provider  aspirin 81 MG tablet Take 81 mg by mouth daily.     Yes Historical Provider, MD  Cholecalciferol (VITAMIN D) 1000 UNITS capsule Take 2,000 Units by mouth daily.   09/19/10 09/19/11 Yes Ricard Dillon, MD  escitalopram (LEXAPRO) 20 MG tablet Take 20 mg by mouth daily. 04/10/11  Yes Ricard Dillon, MD  lansoprazole (PREVACID) 15 MG capsule Take 15 mg by mouth daily.     Yes Historical Provider, MD  memantine (NAMENDA) 10 MG tablet Take 10 mg by mouth daily.   Yes  Historical Provider, MD  METRONIDAZOLE, TOPICAL, 0.75 % LOTN Apply topically daily.     Yes Historical Provider, MD  Multiple Vitamin (MULTIVITAMIN) tablet Take 1 tablet by mouth daily.     Yes Historical Provider, MD  nebivolol (BYSTOLIC) 5 MG tablet Take 5 mg by mouth daily. 04/25/11  Yes Ricard Dillon, MD  rosuvastatin (CRESTOR) 20 MG tablet Take 10 mg by mouth daily.   Yes Historical Provider, MD  sucralfate (CARAFATE) 1 G tablet Take 1 g by mouth daily.    Yes Historical Provider, MD  Tamsulosin HCl (FLOMAX) 0.4 MG CAPS Take 0.4 mg by mouth daily. 06/05/11  Yes Ricard Dillon, MD  triamterene-hydrochlorothiazide (MAXZIDE-25) 37.5-25 MG per tablet Take 1 tablet by mouth every morning. 05/09/11 05/08/12 Yes Ricard Dillon, MD   Physical Exam: Filed Vitals:   07/08/11 1400 07/08/11 1430 07/08/11 1439 07/08/11 1445  BP: 109/52 109/51  90/45  Pulse: 89 95  90  Temp:   100.4 F (38 C)   TempSrc:   Oral   Resp: 26 25  26   SpO2: 94% 95%  95%     General:  Alert , following commands, no acute resp distress, diaphoretic  Eyes: PERRLA, EOMI  ENT: throat  clear, no exudate  Neck: no JVD, no TM  Cardiovascular: iRReg irreg  no murmurs  Respiratory: clear ant fields bilat  Abdomen: no RUQ tenderness, palpable hepatomegaly soft, NT, moderate tenderness over suprapubic area, no CVA tenderness  Skin: pale, warm, clamy  Musculoskeletal: inatct  Psychiatric: unobtainable  Neurologic: moves all 4 extremities, according to wife speech is at baseline aphasia  Labs on Admission:  Basic Metabolic Panel:  Lab [id number] 1343  NA 128*  K 3.7  CL 90*  CO2 25  GLUCOSE 117*  BUN 29*  CREATININE 1.40*  CALCIUM 9.2  MG --  PHOS --   Liver Function Tests:  Lab 07/08/11 1343  AST 769*  ALT 518*  ALKPHOS 256*  BILITOT 2.1*  PROT 7.4  ALBUMIN 3.6   No results found for this basename: LIPASE:5,AMYLASE:5 in the last 168 hours No results found for this basename: AMMONIA:5 in the last  168 hours CBC:  Lab 07/08/11 1343  WBC 6.5  NEUTROABS 6.2  HGB 14.2  HCT 39.3  MCV 86.4  PLT 130*   Cardiac Enzymes:  Lab 07/08/11 1516  CKTOTAL 110  CKMB --  CKMBINDEX --  TROPONINI --   BNP: No components found with this basename: POCBNP:5 CBG: No results found for this basename: GLUCAP:5 in the last 168 hours  Radiological Exams on Admission: Dg Chest Portable 1 View  07/08/2011  *RADIOLOGY REPORT*  Clinical Data: Fever, weakness  PORTABLE CHEST - 1 VIEW  Comparison: 12/16/2006  Findings: Borderline cardiomegaly noted.  The patient is status post CABG.  Central mild vascular congestion without convincing pulmonary edema.  Streaky left basilar atelectasis or infiltrate. Mild right basilar atelectasis.  IMPRESSION: No convincing pulmonary edema.  Borderline cardiomegaly.  Streaky left basilar atelectasis or infiltrate.  Original Report Authenticated By: Lahoma Crocker, M.D.    EKG: Independently reviewed. Shows atrial flutter/fibrilation with variable transmission  Principal Problem:  *Sepsis associated hypotension Active Problems:  Fever 41 degrees C or over  HYPERLIPIDEMIA  Iron deficiency anemia secondary to blood loss (chronic)  DEPRESSION, SITUATIONAL, SEVERE  HYPERTENSION  CORONARY ARTERY DISEASE  CEREBROVASCULAR ACCIDENT WITH RIGHT HEMIPARESIS  GERD  Hx of hydronephrosis  S/P CABG x 5  Hepatitis  UTI (lower urinary tract infection)  Toxic metabolic encephalopathy  Hyponatremia  Paroxysmal a-fib   Assessment/Plan 1. High fever, hypotension, afib with RVR - suspect sepsis related to UTI. Has hx of hydronephrosis and he is tender over the bladder. DDx includes - medication side effect with "neuroleptic malignant syndrome like reaction " , rhabdomyolysis, viral syndrome (e.g. Acute hepatitis A). For now will proceed with PCT check, CK total check, Bcx, Ucx, hepatitis serology, empiric abx with Rocephin  2. Afib - no old EKG to compare ? New or old. Doubt source of  hemodynamic instability.  3. CAD s/p CABG - to cont aspirin, holding statin until rhabdo ruled out and LFTs better  4. Acute hepatitis - ? Due to sepsis. Will get Abd [place], not tender in the RUQ but obviously cholecystitis is a possibility . Will check hepatitis serology, stop statin.f/u labs,  5. TME with DDX also being hepatic encephalopathy. Should improve with hemodynamic support  Code Status: full Family Communication: wife Festus Lennox [id number] Disposition Plan: unclear  Edythe Lynn, MD  [facility] Pager [phone]  If 7PM-7AM, please contact night-coverage [email or web address] Password [facility] 07/08/2011, 3:59 PM

## 2011-07-08 NOTE — ED Notes (Signed)
MD at bedside. 

## 2011-07-08 NOTE — ED Provider Notes (Signed)
History     CSN: [id number]  Arrival date & time 07/08/11  1316   First MD Initiated Contact with Patient 07/08/11 1328      Chief Complaint  Patient presents with  . Fever     HPI Patient was brought in by his family because he was "not acting right".  Office notice some urinary incontinence the last few days along with darker urine than normal.  Patient has no abdominal pain.  Did have vomiting this morning but no diarrhea.  Has had a slight cough but nothing unusual.  No shortness of breath as per wife. Past Medical History  Diagnosis Date  . GERD (gastroesophageal reflux disease)   . Hyperlipidemia   . Hypertension   . CAD (coronary artery disease)     Past Surgical History  Procedure Date  . Coronary artery bypass graft     x 5  . Tonsillectomy   . Band hemorrhoidectomy   . Dental implants   . Abdominal surgery     Family History  Problem Relation Age of Onset  . Heart attack      fhx  . Coronary artery disease      fhx  . Heart disease Father     History  Substance Use Topics  . Smoking status: Former Research scientist (life sciences)  . Smokeless tobacco: Not on file  . Alcohol Use: No      Review of Systems  Allergies  Ciprofloxacin hcl and Sulfamethoxazole w-trimethoprim  Home Medications   Current Outpatient Rx  Name Route Sig Dispense Refill  . ASPIRIN 81 MG PO TABS Oral Take 81 mg by mouth daily.      Marland Kitchen VITAMIN D 1000 UNITS PO CAPS Oral Take 2,000 Units by mouth daily.      Marland Kitchen ESCITALOPRAM OXALATE 20 MG PO TABS Oral Take 20 mg by mouth daily.    Marland Kitchen LANSOPRAZOLE 15 MG PO CPDR Oral Take 15 mg by mouth daily.      Marland Kitchen MEMANTINE HCL 10 MG PO TABS Oral Take 10 mg by mouth daily.    Marland Kitchen METRONIDAZOLE 0.75 % EX LOTN Apply externally Apply topically daily.      Marland Kitchen ONE-DAILY MULTI VITAMINS PO TABS Oral Take 1 tablet by mouth daily.      . NEBIVOLOL HCL 5 MG PO TABS Oral Take 5 mg by mouth daily.    Marland Kitchen ROSUVASTATIN CALCIUM 20 MG PO TABS Oral Take 10 mg by mouth daily.    .  SUCRALFATE 1 G PO TABS Oral Take 1 g by mouth daily.     Marland Kitchen TAMSULOSIN HCL 0.4 MG PO CAPS Oral Take 0.4 mg by mouth daily.    . TRIAMTERENE-HCTZ 37.5-25 MG PO TABS Oral Take 1 tablet by mouth every morning.      BP 90/45  Pulse 90  Temp(Src) 100.4 F (38 C) (Oral)  Resp 26  SpO2 95%  Physical Exam  Nursing note and vitals reviewed. Constitutional: He appears well-developed and well-nourished. No distress.  HENT:  Head: Normocephalic and atraumatic.  Eyes: Pupils are equal, round, and reactive to light.  Neck: Normal range of motion. Neck supple.  Cardiovascular: Normal rate and intact distal pulses.   Pulmonary/Chest: No respiratory distress.  Abdominal: Normal appearance. He exhibits no distension. There is no tenderness. There is no rebound and no guarding.  Genitourinary: Penis normal.  Musculoskeletal: Normal range of motion.  Neurological: He is alert. No cranial nerve deficit.  Skin: Skin is warm and dry. No rash  noted.  Psychiatric: He has a normal mood and affect. His behavior is normal.    ED Course  Procedures (including critical care time)  Labs Reviewed  CBC - Abnormal; Notable for the following:    MCHC 36.1 (*)    Platelets 130 (*)    All other components within normal limits  DIFFERENTIAL - Abnormal; Notable for the following:    Neutrophils Relative 96 (*)    Lymphocytes Relative 3 (*)    Lymphs Abs 0.2 (*)    Monocytes Relative 2 (*)    All other components within normal limits  COMPREHENSIVE METABOLIC PANEL - Abnormal; Notable for the following:    Sodium 128 (*)    Chloride 90 (*)    Glucose, Bld 117 (*)    BUN 29 (*)    Creatinine, Ser 1.40 (*)    AST 769 (*)    ALT 518 (*)    Alkaline Phosphatase 256 (*)    Total Bilirubin 2.1 (*)    GFR calc non Af Amer 46 (*)    GFR calc Af Amer 53 (*)    All other components within normal limits  LACTIC ACID, PLASMA  CULTURE, BLOOD (ROUTINE X 2)  CULTURE, BLOOD (ROUTINE X 2)  URINALYSIS, ROUTINE W  REFLEX MICROSCOPIC  URINE CULTURE  CK   Dg Chest Portable 1 View  07/08/2011  *RADIOLOGY REPORT*  Clinical Data: Fever, weakness  PORTABLE CHEST - 1 VIEW  Comparison: 12/16/2006  Findings: Borderline cardiomegaly noted.  The patient is status post CABG.  Central mild vascular congestion without convincing pulmonary edema.  Streaky left basilar atelectasis or infiltrate. Mild right basilar atelectasis.  IMPRESSION: No convincing pulmonary edema.  Borderline cardiomegaly.  Streaky left basilar atelectasis or infiltrate.  Original Report Authenticated By: Lahoma Crocker, M.D.     1. Fever   2. Hypotension       MDM          Dot Lanes, MD 07/08/11 1517

## 2011-07-08 NOTE — Progress Notes (Signed)
[facility] progress note. Chief complaint. Bradycardia. History of present illness. This 78 year old male with a history of coronary artery disease and prior CABG admitted today because of low blood pressure and fever. He was noted in atrial fib with rapid ventricular response. Unclear if this was old or new problem. I do note he was taking a beta blocker at home. He has not complained of chest pain. He is followed in the in step down on telemetry and staff has noted them bradycardia in the 40s when the patient is awake and in the 20s and 30s per minute while sleeping. His systolic blood pressure was mildly low in the 80s and I did bolus of 250 cc of normal saline with improvement in systolic blood pressure to upper 90s. Patient is alert and cooperative. Again he has no complaints of chest pain but not felt to be a very reliable historian. Vital signs. Temperature 97.7 pulse 42 apical, respiration 18, blood pressure 99/49. O2 sats 97%. General appearance. Well-developed male in no distress. He is somewhat dysarthric. Due to be an unreliable historian. Cardiac. Regular with frequent irregular beats. Bradycardic currently in the 40s apically. No jugular venous distention. No edema. No calf pain and negative Homans. Lungs. Some scattered rhonchi in the bases. No distress and stable O2 sats. Abdomen. Soft with positive bowel sounds. No pain. Impression/plan. Problem #1 bradycardia. 12-lead EKG was obtained and this did confirm a rate in the 40s. T wave appears inverted compared to earlier EKG and aVF and V1. Otherwise no significant EKG changes noted. Patient is currently on no negative chronotropic medications though I do see he was on a beta blocker at home. I did contact Dr. Inda Coke of cardiology who will loss see the patient. His first set of cardiac enzymes showed a negative troponin. Patient has 2 more sets of enzymes pending and I will follow for these results. Rapid response also at the bedside  and they recommended transferring patient to ICU status in 2900 unit. I agree and a transfer order has been written. Will also check a serum magnesium.

## 2011-07-09 ENCOUNTER — Inpatient Hospital Stay (HOSPITAL_COMMUNITY): Payer: Medicare Other

## 2011-07-09 DIAGNOSIS — R7881 Bacteremia: Secondary | ICD-10-CM | POA: Diagnosis present

## 2011-07-09 DIAGNOSIS — A413 Sepsis due to Hemophilus influenzae: Secondary | ICD-10-CM

## 2011-07-09 DIAGNOSIS — R001 Bradycardia, unspecified: Secondary | ICD-10-CM | POA: Diagnosis not present

## 2011-07-09 DIAGNOSIS — A4150 Gram-negative sepsis, unspecified: Secondary | ICD-10-CM

## 2011-07-09 DIAGNOSIS — E782 Mixed hyperlipidemia: Secondary | ICD-10-CM

## 2011-07-09 LAB — COMPREHENSIVE METABOLIC PANEL
ALT: 323 U/L — ABNORMAL HIGH (ref 0–53)
AST: 300 U/L — ABNORMAL HIGH (ref 0–37)
CO2: 25 mEq/L (ref 19–32)
Calcium: 8.4 mg/dL (ref 8.4–10.5)
Chloride: 96 mEq/L (ref 96–112)
GFR calc non Af Amer: 37 mL/min — ABNORMAL LOW (ref >90)
Potassium: 4.3 mEq/L (ref 3.5–5.1)
Sodium: 132 mEq/L — ABNORMAL LOW (ref 135–145)
Total Bilirubin: 1 mg/dL (ref 0.3–1.2)

## 2011-07-09 LAB — CLOSTRIDIUM DIFFICILE BY PCR: Toxigenic C. Difficile by PCR: NEGATIVE

## 2011-07-09 LAB — CARDIAC PANEL(CRET KIN+CKTOT+MB+TROPI)
CK, MB: 8.9 ng/mL (ref 0.3–4.0)
Relative Index: 2 (ref 0.0–2.5)
Relative Index: 2.1 (ref 0.0–2.5)
Total CK: 456 U/L — ABNORMAL HIGH (ref 7–232)
Total CK: 429 U/L — ABNORMAL HIGH (ref 7–232)

## 2011-07-09 LAB — CBC
Hemoglobin: 12.5 g/dL — ABNORMAL LOW (ref 13.0–17.0)
MCH: 30.9 pg (ref 26.0–34.0)
MCHC: 34.9 g/dL (ref 30.0–36.0)

## 2011-07-09 MED ORDER — MEMANTINE HCL 10 MG PO TABS
10.0000 mg | ORAL_TABLET | Freq: Every day | ORAL | Status: DC
  Administered 2011-07-09 – 2011-07-14 (×6): 10 mg via ORAL
  Filled 2011-07-09 (×6): qty 1

## 2011-07-09 MED ORDER — DEXTROSE 5 % IV SOLN
1.0000 g | Freq: Two times a day (BID) | INTRAVENOUS | Status: DC
  Administered 2011-07-09 – 2011-07-11 (×5): 1 g via INTRAVENOUS
  Filled 2011-07-09 (×7): qty 1

## 2011-07-09 MED ORDER — SODIUM CHLORIDE 0.9 % IV BOLUS (SEPSIS)
250.0000 mL | Freq: Once | INTRAVENOUS | Status: AC
  Administered 2011-07-09: 250 mL via INTRAVENOUS

## 2011-07-09 MED ORDER — SUCRALFATE 1 G PO TABS
1.0000 g | ORAL_TABLET | Freq: Every day | ORAL | Status: DC
  Administered 2011-07-09 – 2011-07-14 (×6): 1 g via ORAL
  Filled 2011-07-09 (×6): qty 1

## 2011-07-09 NOTE — Progress Notes (Addendum)
[facility] PROGRESS NOTE  OLAJUWON BEND [id number] DOB: Oct 01, 1928 DOA: 07/08/2011 PCP: [name], MD, MD  Assessment/Plan  *Sepsis associated hypotension Improving with fluid resusitation- holding Maxzide, Bystolic and Flomax   Gram-negative bacteremia/UTI (lower urinary tract infection) Cefepime- await final culture results   Hyponatremia Due to HCTZ and volume depletion, improving with NS    Paroxysmal a-fib/Bradycardia  bradycardia last night- he was on a BB at home which had been held when he was admitted yesterday. HR is improving now.    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   Elevated LFTS  Likely due to sepsis but he dose have underlying fatty liver on ultrasound.    Thrombocytopenia This appears to be chronic.   HYPERLIPIDEMIA  Iron deficiency anemia secondary to blood loss (chronic)  DEPRESSION, SITUATIONAL, SEVERE  CORONARY ARTERY DISEASE  GERD  Hx of hydronephrosis  S/P CABG x 5   Code Status: full code Family Communication: no family at bedside Disposition Plan: not yet decided  Debbe Odea, MD  [facility] Pager [phone]  If 7PM-7AM, please contact night-coverage [email or web address] Password [email or web address] 07/09/2011, 3:46 PM   LOS: 1 day   Brief narrative: 78 yo man with hx of CVA, CABG, depression, brought to the ED after wife noted he was too weak to get OOB.  Antibiotics:  cefepime  Subjective: Unintelligible speech but points to his meal tray when asked if he is eating OK. Nods when asked if he would like to get out of bed today.  Objective: Filed Vitals:   07/09/11 1200 07/09/11 1300 07/09/11 1400 07/09/11 1500  BP: 112/40 98/52 114/55 135/56  Pulse: 59 84 64 83  Temp:      TempSrc:      Resp: 14 18 17 17   Height:      Weight:      SpO2: 98% 97% 96% 97%    Intake/Output Summary (Last 24 hours) at 07/09/11 1546 Last  data filed at 07/09/11 1500  Gross per 24 hour  Intake   1683 ml  Output    565 ml  Net   1118 ml    Exam: General: Alert , following commands, no acute resp distress, diaphoretic  Eyes: PERRLA, EOMI  ENT: throat clear, no exudate  Neck: no JVD, no TM  Cardiovascular: iRReg irreg no murmurs  Respiratory: clear ant fields bilat  Abdomen: non-tender, non-distended, bs + Neurologic: moves all 4 extremities   Data Reviewed: Basic Metabolic Panel:  Lab 0000000 0500 07/09/11 07/08/11 1343  NA 132* -- 128*  K 4.3 -- 3.7  CL 96 -- 90*  CO2 25 -- 25  GLUCOSE 96 -- 117*  BUN 37* -- 29*  CREATININE 1.66* -- 1.40*  CALCIUM 8.4 -- 9.2  MG -- 2.1 --  PHOS -- -- --   Liver Function Tests:  Lab 07/09/11 0500 07/08/11 1343  AST 300* 769*  ALT 323* 518*  ALKPHOS 189* 256*  BILITOT 1.0 2.1*  PROT 6.3 7.4  ALBUMIN 3.0* 3.6   No results found for this basename: LIPASE:5,AMYLASE:5 in the last 168 hours  Lab 07/08/11 1553  AMMONIA 29   CBC:  Lab 07/09/11 0500 07/08/11 1343  WBC 14.1* 6.5  NEUTROABS -- 6.2  HGB 12.5* 14.2  HCT 35.8* 39.3  MCV 88.4 86.4  PLT 131* 130*   Cardiac Enzymes:  Lab 07/09/11 0500 07/09/11 07/08/11 1752 07/08/11 1554 07/08/11 1516  CKTOTAL 429* 456*  276* -- 110  CKMB 9.0* 8.9* 4.2* -- --  CKMBINDEX -- -- -- -- --  TROPONINI <0.30 <0.30 <0.30 <0.30 --   BNP: No components found with this basename: POCBNP:5 CBG: No results found for this basename: GLUCAP:5 in the last 168 hours  Recent Results (from the past 240 hour(s))  CULTURE, BLOOD (ROUTINE X 2)     Status: Normal (Preliminary result)   Collection Time   07/08/11  1:50 PM      Component Value Range Status Comment   Specimen Description BLOOD LEFT ARM   Final    Special Requests BOTTLES DRAWN AEROBIC AND ANAEROBIC 10CC   Final    Culture  Setup Time [id number]   Final    Culture     Final    Value: GRAM NEGATIVE RODS     Note: Gram Stain Report Called to,Read Back By and Verified  With: Purcell Nails RN on 07/09/11 at 05:20 by Rise Mu   Report Status PENDING   Incomplete   CULTURE, BLOOD (ROUTINE X 2)     Status: Normal (Preliminary result)   Collection Time   07/08/11  1:54 PM      Component Value Range Status Comment   Specimen Description BLOOD LEFT HAND   Final    Special Requests BOTTLES DRAWN AEROBIC AND ANAEROBIC 10CC   Final    Culture  Setup Time [id number]   Final    Culture     Final    Value: GRAM NEGATIVE RODS     Note: Gram Stain Report Called to,Read Back By and Verified With: Purcell Nails RN on 07/09/11 at 05:20 by Rise Mu   Report Status PENDING   Incomplete   MRSA PCR SCREENING     Status: Normal   Collection Time   07/08/11  5:36 PM      Component Value Range Status Comment   MRSA by PCR NEGATIVE  NEGATIVE  Final   CLOSTRIDIUM DIFFICILE BY PCR     Status: Normal   Collection Time   07/09/11 12:06 AM      Component Value Range Status Comment   C difficile by pcr NEGATIVE  NEGATIVE  Final      Studies: US Abdomen Complete  07/09/2011  *RADIOLOGY REPORT*  Clinical Data:  Abnormal LFTs  COMPLETE ABDOMINAL ULTRASOUND  Comparison:  CT scan 03/28/2005  Findings:  Gallbladder:  Surgically absent  Common bile duct:  Measures 6.7 mm mid aspect, distally measures 9.1 mm in diameter.  Liver:  Liver shows coarse diffuse increased echogenicity which may be due to fatty infiltration or chronic hepatitis.  IVC:  Appears normal.  Pancreas:  Somewhat heterogeneous without definite focal abnormality.  Spleen:  Measures 6 cm in length.  Normal echogenicity.  Right Kidney:  Measures 12 cm in length.  At least three cysts are noted within the right kidney the largest measures 6.4 x 6 cm. No hydronephrosis or diagnostic renal calculus.  Left Kidney:  Measures 13,4 cm in length.  No hydronephrosis or diagnostic renal calculus.  Multiple cysts are noted than the largest mid pole region measures 4.7 x 5.9 x 4.7 cm.  Abdominal aorta:  No aneurysm identified. Limited  visualization due to abundant bowel gas.  Visualized abdominal aorta measures 2 cm in diameter.  Atherosclerotic calcification of the aortic wall.  IMPRESSION:  1.  Surgically absent gallbladder. 2.  Mild prominent size CBD probable post cholecystectomy. 3.  Diffuse increased echogenicity of the liver without focal mass. This  may be due to fatty infiltration or chronic hepatitis. 4.  No hydronephrosis or diagnostic renal calculus.  Bilateral renal cysts are noted.  Original Report Authenticated By: Lahoma Crocker, M.D.   Dg Chest Port 1 View  07/09/2011  *RADIOLOGY REPORT*  Clinical Data: Rule out congestive heart failure  PORTABLE CHEST - 1 VIEW  Comparison: Chest radiograph 07/08/2011  Findings:  Sternal wires overlie stable cardiac silhouette.  There is mild bibasilar air space disease suggesting mild edema. No focal consolidation.  No pneumothorax.  IMPRESSION: Mild progressive air space disease at the lung bases suggesting edema.  Original Report Authenticated By: Suzy Bouchard, M.D.   Dg Chest Portable 1 View  07/08/2011  *RADIOLOGY REPORT*  Clinical Data: Fever, weakness  PORTABLE CHEST - 1 VIEW  Comparison: 12/16/2006  Findings: Borderline cardiomegaly noted.  The patient is status post CABG.  Central mild vascular congestion without convincing pulmonary edema.  Streaky left basilar atelectasis or infiltrate. Mild right basilar atelectasis.  IMPRESSION: No convincing pulmonary edema.  Borderline cardiomegaly.  Streaky left basilar atelectasis or infiltrate.  Original Report Authenticated By: Lahoma Crocker, M.D.    Scheduled Meds:   . aspirin EC  81 mg Oral Daily  . ceFEPime (MAXIPIME) IV  1 g Intravenous Q12H  . enoxaparin  40 mg Subcutaneous Q24H  . escitalopram  20 mg Oral Daily  . pantoprazole  20 mg Oral Q1200  . sodium chloride  2,000 mL Intravenous Once  . sodium chloride  250 mL Intravenous Once  . sodium chloride  250 mL Intravenous Once  . sodium chloride  3 mL Intravenous Q12H  .  DISCONTD: aspirin  81 mg Oral Daily  . DISCONTD: cefTRIAXone (ROCEPHIN)  IV  1 g Intravenous Q24H   Continuous Infusions:   . sodium chloride 100 mL/hr at 07/08/11 1825

## 2011-07-09 NOTE — Progress Notes (Signed)
CRITICAL VALUE ALERT  Critical value received: ck mb 8.9, ck total 456, pro bnp >15000  Date of notification:  07/09/2011 Time of notification:  0210  Critical value read back:yes  Nurse who received alert:  Purcell Nails  MD notified (1st page):  Kathline Magic  Time of first page: 0210  MD notified (2nd page):  Time of second page:  Responding MD:  Kathline Magic  Time MD responded:  [phone]

## 2011-07-09 NOTE — Progress Notes (Signed)
Upon initial assessment at 2000 it was noted that pt's BP was 88/50 with heart rate 55.  Pt was afib on the monitor and was sustaining in the 50's to 60's BPM and having nonsustained heart rates in the 40's.  Pt was sleeping but arousable and denied chest pain and sob.  Lungs sounds were rhonchus, left greater than right, coarse and diminished bases. O2 sats 96-100% on 2LO2NC and did not have any complaints or appear to be in any distress.  Pt was diaphoretic, but temp had been 104.2 in the ED and the fever was then 97.7.  Fredirick Maudlin NP notified of above and NS bolus 250cc was ordered and given at 2021.  By 2100, pt's blood pressure was 105/53, but heart rate had continued on a downward trend and by 2300 the heart rate was sustaining in 34-48 with nonsustained episodes in the 20's.  Pt was more difficult to arouse than previously. Bp remained stable 121/61.  Pt denied pain and SOB.  Called Rapid Response to evaluate bradycardia.  EKG obtained, which did show some T-wave inversion.  T.Callahan NP notified of above and he came to the bedside to assess the pt.  Pt was moved to 2901, by the Rapid Response nurse and floor RN via bed, with all of his belongings.  Wife, Garette Vivona was called and is aware of pts transfer to 2900.

## 2011-07-10 ENCOUNTER — Ambulatory Visit: Payer: Medicare Other | Admitting: Internal Medicine

## 2011-07-10 DIAGNOSIS — R4182 Altered mental status, unspecified: Secondary | ICD-10-CM

## 2011-07-10 DIAGNOSIS — E782 Mixed hyperlipidemia: Secondary | ICD-10-CM

## 2011-07-10 DIAGNOSIS — A4150 Gram-negative sepsis, unspecified: Secondary | ICD-10-CM

## 2011-07-10 LAB — COMPREHENSIVE METABOLIC PANEL
Alkaline Phosphatase: 162 U/L — ABNORMAL HIGH (ref 39–117)
BUN: 28 mg/dL — ABNORMAL HIGH (ref 6–23)
CO2: 19 mEq/L (ref 19–32)
Chloride: 99 mEq/L (ref 96–112)
GFR calc Af Amer: 71 mL/min — ABNORMAL LOW (ref >90)
Glucose, Bld: 96 mg/dL (ref 70–99)
Total Bilirubin: 0.6 mg/dL (ref 0.3–1.2)

## 2011-07-10 LAB — CBC
HCT: 34.7 % — ABNORMAL LOW (ref 39.0–52.0)
Hemoglobin: 11.9 g/dL — ABNORMAL LOW (ref 13.0–17.0)
RBC: 3.88 MIL/uL — ABNORMAL LOW (ref 4.22–5.81)
WBC: 7.3 10*3/uL (ref 4.0–10.5)

## 2011-07-10 MED ORDER — ESCITALOPRAM OXALATE 10 MG PO TABS
10.0000 mg | ORAL_TABLET | Freq: Every day | ORAL | Status: DC
  Administered 2011-07-11 – 2011-07-14 (×4): 10 mg via ORAL
  Filled 2011-07-10 (×4): qty 1

## 2011-07-10 MED ORDER — SODIUM CHLORIDE 0.9 % IV SOLN: INTRAVENOUS | Status: DC

## 2011-07-10 MED ORDER — NEBIVOLOL HCL 5 MG PO TABS
5.0000 mg | ORAL_TABLET | Freq: Every day | ORAL | Status: DC
  Administered 2011-07-10 – 2011-07-13 (×4): 5 mg via ORAL
  Filled 2011-07-10 (×5): qty 1

## 2011-07-10 NOTE — Progress Notes (Signed)
[facility] [place] TEAM 1 - Stepdown/ICU TEAM  Subjective: Patient alert and this is an apparent improvement over the past 24 hours. Speech is difficult to understand but it is reported that he has issues with dysarthria from previous stroke. He does not report any shortness of breath or chest pain. No family is at the bedside during our examination.  Objective: Blood pressure 169/93, pulse 67, temperature 98 F (36.7 C), temperature source Oral, resp. rate 23, height 6\' 2"  (1.88 m), weight 89.7 kg (197 lb 12 oz), SpO2 98.00%.  Intake/Output from previous day: 05/12 0701 - 05/13 0700 In: 2550 [I.V.:2450; IV Piggyback:100] Out: 1255 [Urine:1255] Intake/Output this shift: Total I/O In: 860 [P.O.:360; I.V.:500] Out: 60 [Urine:60]  General appearance: alert, cooperative - no distress Resp: clear to auscultation bilaterally Cardio: regular rate and rhythm, S1, S2 normal, no murmur, click, rub or gallop GI: soft, non-tender; bowel sounds normal; no masses,  no organomegaly Extremities: extremities normal, atraumatic, no cyanosis or edema Neurologic: Grossly normal  Lab Results:  Basename 07/10/11 0510 07/09/11 0500  WBC 7.3 14.1*  HGB 11.9* 12.5*  HCT 34.7* 35.8*  PLT 131* 131*   BMET  Basename 07/10/11 0510 07/09/11 0500  NA 130* 132*  K SLIGHT HEMOLYSIS 4.3  CL 99 96  CO2 19 25  GLUCOSE 96 96  BUN 28* 37*  CREATININE 1.10 1.66*  CALCIUM 7.9* 8.4   Medications: I have reviewed the patient's current medications.  Assessment/Plan:  Sepsis associated hypotension *Resolved *Initial Procalcitonin 54  Fever 41 degrees C or over *MAXIMUM TEMPERATURE past 24 hours 100.6  UTI? *Urinalysis not that impressive yet blood cultures grew out Escherichia coli and likely source is urinary tract *Urine culture is pending *White cell count is now normal   Toxic metabolic encephalopathy *Resolving and suspect etiology related to acute infection and  dehydration  Acute renal failure *Likely related to dehydration and acute sepsis *Resolving *Baseline creatinine is 1.2-1.5 *Continue IV fluids but since blood pressure has rebounded nicely we'll decrease rate to 50 cc per hour  Bacteremia due to Escherichia coli *Both blood cultures have grown Escherichia coli - sensitivities are pending *Continue Maxipime for now  Iron deficiency anemia secondary to blood loss (chronic) *Hemoglobin stable at 11.9 *Was not on iron replacement prior to admission  HYPERTENSION *Previous hypotension has resolved *We will go ahead and resume the Bystolic but will hold thiazide diuretics for now  CORONARY ARTERY DISEASE/ S/P CABG x 5 *Stable and asymptomatic *Cardiac troponin has been negative x4 collections  Hyponatremia *Likely related to dehydration and acute illness as well as concomitant use of thiazide diuretics prior to admission *Continue to follow  Paroxysmal a-fib/Bradycardia *Currently rate controlled with ventricular response between 52 and 70 *Watch heart rate with resumption of Bystolic *Echocardiogram pending this admission  HYPERLIPIDEMIA *On Crestor prior to admission  DEPRESSION, SITUATIONAL, SEVERE *Continue escitalopram  CEREBROVASCULAR ACCIDENT WITH RIGHT HEMIPARESIS/Dementia *Continue namenda  GERD *Continue Carafate and Prevacid  Hx of hydronephrosis *Renal ultrasound this admission shows no evidence of hydronephrosis  Hepatitis secondary to mild shock liver *Liver enzymes are trending downward quickly *Acute hepatitis panel negative so doubt this is infectious etiology *Suspect this is related to acute infection and associated hypotension i.e. mild shock liver  Disposition *Transfer to step down unit   LOS: 2 days   Erin Hearing, ANP pager [phone]  [facility] [email or web address] Password: [email or web address]  07/10/2011, 1:57 PM  I have personally examined this patient and reviewed the entire database.  I have reviewed the above note, made any necessary editorial changes, and agree with its content.  Cherene Altes, MD [facility]

## 2011-07-10 NOTE — Evaluation (Signed)
Physical Therapy Evaluation Patient Details Name: [name] MRN: [id number] DOB: May 07, 1928 Today's Date: 07/10/2011 Time: [id number] PT Time Calculation (min): 26 min  PT Assessment / Plan / Recommendation Clinical Impression  Patient admitted with hypotension and fever. He also had difficulty ambulating yesterday per wife. Patient appears today with mobility close to baseline. He does have some chronic deficits due to an old stroke, but he has remained very independent. We will follow acutely to ensure that he can reach  his prior level of function to decrease the burden of care at discharge.    PT Assessment  Patient needs continued PT services    Follow Up Recommendations  Home health PT;Supervision/Assistance - 24 hour (just initially)    Barriers to Discharge  None      lEquipment Recommendations  None recommended by PT    Recommendations for Other Services  TBA ? SLP   Frequency Min 3X/week    Precautions / Restrictions Precautions Precautions: Fall   Pertinent Vitals/Pain VSS/No reports of pain      Mobility  Bed Mobility Bed Mobility: Rolling Left;Left Sidelying to Sit;Sitting - Scoot to Edge of Bed Rolling Left: 6: Modified independent (Device/Increase time) Left Sidelying to Sit: 6: Modified independent (Device/Increase time) Sitting - Scoot to Edge of Bed: 6: Modified independent (Device/Increase time) Transfers Transfers: Sit to Stand;Stand to Sit Sit to Stand: 5: Supervision;With upper extremity assist;From bed;From toilet Stand to Sit: 5: Supervision;To chair/3-in-1;To toilet;With upper extremity assist Details for Transfer Assistance: Patient utilized grab bar at toilet to aide with initiation. No observed difficulties with control of descent. Supervision for lines and leads Ambulation/Gait Ambulation/Gait Assistance: 4: Min guard Ambulation Distance (Feet): 60 Feet Assistive device: None Ambulation/Gait Assistance Details: Right hip external rotation  and tendendcy for poor foot clearance and decreased stance despite good strength in leg.  Gait Pattern: Step-to pattern;Decreased stance time - right;Decreased weight shift to right;Decreased hip/knee flexion - right;Decreased dorsiflexion - right;Right circumduction         PT Diagnosis: Difficulty walking  PT Problem List: Decreased activity tolerance;Decreased mobility;Decreased coordination;Decreased cognition PT Treatment Interventions: Gait training;Stair training;Therapeutic activities;Therapeutic exercise;Patient/family education   PT Goals Acute Rehab PT Goals PT Goal Formulation: With patient Time For Goal Achievement: 07/17/11 Potential to Achieve Goals: Good Pt will go Sit to Stand: Independently;with upper extremity assist PT Goal: Sit to Stand - Progress: Goal set today Pt will go Stand to Sit: Independently;with upper extremity assist PT Goal: Stand to Sit - Progress: Goal set today Pt will Ambulate: >150 feet;with supervision PT Goal: Ambulate - Progress: Goal set today Pt will Go Up / Down Stairs: with supervision;3-5 stairs;with rail(s) PT Goal: Up/Down Stairs - Progress: Goal set today  Visit Information  Last PT Received On: 07/10/11 Assistance Needed: +1    Subjective Data  Subjective: Spouse states patient active at home Patient Stated Goal: Patient indicated he would like to use the bathroom   Prior Functioning  Home Living Lives With: Spouse Available Help at Discharge: Available PRN/intermittently;Family Type of Home: House Home Access: Stairs to enter [employer] of Steps: 3-4 Entrance Stairs-Rails: Right;Left;Can reach both Home Layout: One level Bathroom Shower/Tub: Chiropodist: Handicapped height Home Adaptive Equipment: Straight cane Prior Function Level of Independence: Independent Able to Take Stairs?: Yes Driving: Yes 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    Cognition   Overall Cognitive Status: Impaired Area of Impairment: Problem solving Arousal/Alertness: Awake/alert Orientation Level: Appears intact for tasks assessed Behavior During Session: [facility]  for tasks performed Problem Solving: Needed assistance with problem solving with hand washing task at sink     Extremity/Trunk Assessment Right Lower Extremity Assessment RLE ROM/Strength/Tone: Within functional levels RLE Sensation: WFL - Light Touch RLE Coordination: Deficits RLE Coordination Deficits: Decreased grading of control of movement - tendency for full muscular effort and difficulty in decreasing Left Lower Extremity Assessment LLE ROM/Strength/Tone: Within functional levels LLE Sensation: WFL - Light Touch LLE Coordination: WFL - gross/fine motor Trunk Assessment Trunk Assessment: Normal   Balance Static Sitting Balance Static Sitting - Balance Support: No upper extremity supported;Feet supported Static Sitting - Level of Assistance: 7: Independent Static Standing Balance Static Standing - Balance Support: No upper extremity supported Static Standing - Level of Assistance: 7: Independent  End of Session PT - End of Session Equipment Utilized During Treatment: Gait belt Activity Tolerance: Patient tolerated treatment well Patient left: in chair;with call bell/phone within reach Nurse Communication: Mobility status   Jake Bathe, PT  Pager [phone]  07/10/2011, 9:18 AM

## 2011-07-10 NOTE — Care Management Note (Signed)
    Page 1 of 2   07/14/2011     10:17:29 AM   CARE MANAGEMENT NOTE 07/14/2011  Patient:  [name], [name]   Account Number:  [id number]  Date Initiated:  07/10/2011  Documentation initiated by:  Elissa Hefty  Subjective/Objective Assessment:   adm w sepsis     Action/Plan:   lives w wife, pcp dr Benay Pillow   Anticipated DC Date:  07/13/2011   Anticipated DC Plan:  [place]  CM consult      [place]   Choice offered to / List presented to:  C-1 Patient   DME arranged  IV PUMP/EQUIPMENT      DME agency  [place] arranged  HH-1 RN  [place].   Status of service:  Completed, signed off Medicare Important Message given?  NO (If response is "NO", the following Medicare IM given date fields will be blank) Date Medicare IM given:   Date Additional Medicare IM given:    Discharge Disposition:  [place]  Per UR Regulation:  Reviewed for med. necessity/level of care/duration of stay  If discussed at [place] of Stay Meetings, dates discussed:    Comments:  07/14/11 1015 Pt. to discharge home today with [facility] services for IV antibiotics.  PICC line in place and [facility] will be providing services.  Llana Aliment, RN, BSN [place] [phone]  07/12/11 1415 Spoke with pt. and spouse about having [facility] services.  1st choice was [facility] and when this NCM called to give referral, Vaughan Basta from [place], stated they could not take the case.  Pt. was made aware and changed choice to [facility].  TC to [facility] with [place] for referral [facility] RN for IV antibiotics and HH PT.  Anticipated discharged is for tomorrow or Friday.  Pt. will obtain PICC line today. Llana Aliment, RN, BSN NCM [phone]   07/10/11 11:25a debbie dowell rn,bsn [id number] phy ther rec hhpt.

## 2011-07-10 NOTE — Plan of Care (Signed)
Problem: Phase I Progression Outcomes Goal: OOB as tolerated unless otherwise ordered Outcome: Completed/Met Date Met:  07/10/11 Up to bathroom.

## 2011-07-10 NOTE — Progress Notes (Signed)
  Echocardiogram 2D Echocardiogram has been performed.  Christop Hippert, Orlena Sheldon 07/10/2011, 6:36 PM

## 2011-07-11 DIAGNOSIS — A4151 Sepsis due to Escherichia coli [E. coli]: Secondary | ICD-10-CM

## 2011-07-11 DIAGNOSIS — E782 Mixed hyperlipidemia: Secondary | ICD-10-CM

## 2011-07-11 DIAGNOSIS — Z8679 Personal history of other diseases of the circulatory system: Secondary | ICD-10-CM

## 2011-07-11 DIAGNOSIS — A4150 Gram-negative sepsis, unspecified: Secondary | ICD-10-CM

## 2011-07-11 LAB — COMPREHENSIVE METABOLIC PANEL
AST: 51 U/L — ABNORMAL HIGH (ref 0–37)
Albumin: 2.8 g/dL — ABNORMAL LOW (ref 3.5–5.2)
Chloride: 99 mEq/L (ref 96–112)
Creatinine, Ser: 1.04 mg/dL (ref 0.50–1.35)
Total Bilirubin: 0.5 mg/dL (ref 0.3–1.2)
Total Protein: 6.3 g/dL (ref 6.0–8.3)

## 2011-07-11 LAB — CBC
HCT: 34.4 % — ABNORMAL LOW (ref 39.0–52.0)
Hemoglobin: 12.2 g/dL — ABNORMAL LOW (ref 13.0–17.0)
MCH: 31 pg (ref 26.0–34.0)
MCHC: 35.5 g/dL (ref 30.0–36.0)
MCV: 87.3 fL (ref 78.0–100.0)
Platelets: 133 10*3/uL — ABNORMAL LOW (ref 150–400)
RBC: 3.94 MIL/uL — ABNORMAL LOW (ref 4.22–5.81)
RDW: 13.7 % (ref 11.5–15.5)
WBC: 6.6 10*3/uL (ref 4.0–10.5)

## 2011-07-11 LAB — CULTURE, BLOOD (ROUTINE X 2): Culture  Setup Time: 201305111806

## 2011-07-11 MED ORDER — POTASSIUM CHLORIDE CRYS ER 20 MEQ PO TBCR
40.0000 meq | EXTENDED_RELEASE_TABLET | Freq: Once | ORAL | Status: AC
  Administered 2011-07-11: 40 meq via ORAL
  Filled 2011-07-11: qty 2

## 2011-07-11 MED ORDER — TAMSULOSIN HCL 0.4 MG PO CAPS
0.4000 mg | ORAL_CAPSULE | Freq: Every day | ORAL | Status: DC
  Administered 2011-07-11 – 2011-07-14 (×4): 0.4 mg via ORAL
  Filled 2011-07-11 (×4): qty 1

## 2011-07-11 MED ORDER — DEXTROSE 5 % IV SOLN
1.0000 g | INTRAVENOUS | Status: DC
  Administered 2011-07-11 – 2011-07-14 (×4): 1 g via INTRAVENOUS
  Filled 2011-07-11 (×4): qty 10

## 2011-07-11 NOTE — Progress Notes (Signed)
[facility] [place] TEAM 1 - Stepdown/ICU TEAM  Brief narrative:  78 yo man with hx of CVA, CABG, depression, brought to the ED after wife noted he was too weak to get OOB  Antibiotics:  Rocephin   Subjective: Alert, sitting up in chair and smiling. No complaints verbalize.  Objective: Blood pressure 136/57, pulse 54, temperature 98.4 F (36.9 C), temperature source Oral, resp. rate 19, height 6\' 2"  (1.88 m), weight 90.8 kg (200 lb 2.8 oz), SpO2 96.00%.  Intake/Output from previous day: 05/13 0701 - 05/14 0700 In: 1717.5 [P.O.:720; I.V.:997.5] Out: 510 [Urine:510] Intake/Output this shift: Total I/O In: 83 [I.V.:33; IV Piggyback:50] Out: -   General appearance: alert, cooperative - no distress Resp: clear to auscultation bilaterally Cardio: regular rate and rhythm-occasionally bradycardic rate in the high 60s GI: soft, non-tender; bowel sounds normal; no masses,  no organomegaly Extremities: extremities normal, atraumatic, no cyanosis or edema Neurologic: Grossly normal  Lab Results:  Basename 07/11/11 0500 07/10/11 0510  WBC 6.6 7.3  HGB 12.2* 11.9*  HCT 34.4* 34.7*  PLT 133* 131*   BMET  Basename 07/11/11 0500 07/10/11 0510  NA 134* 130*  K 3.2* SLIGHT HEMOLYSIS  CL 99 99  CO2 25 19  GLUCOSE 100* 96  BUN 21 28*  CREATININE 1.04 1.10  CALCIUM 8.5 7.9*   Medications: I have reviewed the patient's current medications.  Assessment/Plan:  Sepsis associated hypotension *Resolved *Initial Procalcitonin 54  Bacteremia due to Escherichia coli *Both blood cultures have grown Escherichia coli which is pan sensitive *Change Maxipime to Rocephin and likely will need a total of 10-14 days of total antibiotic therapy  UTI? *Urinalysis not that impressive yet blood cultures grew out Escherichia coli and likely source is urinary tract *Urine culture is negative *White cell count is now normal   Toxic metabolic encephalopathy *Resolving and suspect  etiology related to acute infection and dehydration  Acute renal failure *Likely related to dehydration and acute sepsis *Resolving *Baseline creatinine is 1.2-1.5 *Continue IV fluids but since blood pressure has rebounded nicely we'll decrease rate to KVO  Iron deficiency anemia secondary to blood loss (chronic) *Hemoglobin stable at 11.9 *Was not on iron replacement prior to admission  HYPERTENSION *Previous hypotension has resolved *Tolerating Bystolic *Resume Flomax *Continue to hold thiazide diuretic  CORONARY ARTERY DISEASE/ S/P CABG x 5 *Stable and asymptomatic *Cardiac troponin has been negative x4 collections  Hyponatremia *Likely related to dehydration and acute illness as well as concomitant use of thiazide diuretics prior to admission *Continue to follow  Hypokalemia Will replace PO  Paroxysmal a-fib/Bradycardia *Currently rate controlled with ventricular response between 52 and 70 *Watch heart rate with resumption of Bystolic *Echocardiogram pending this admission  HYPERLIPIDEMIA *On Crestor prior to admission  DEPRESSION, SITUATIONAL, SEVERE *Continue escitalopram  CEREBROVASCULAR ACCIDENT WITH RIGHT HEMIPARESIS/Dementia *Continue namenda  GERD *Continue Carafate and Prevacid  Hx of hydronephrosis *Renal ultrasound this admission shows no evidence of hydronephrosis  Hepatitis secondary to mild shock liver *Liver enzymes are trending downward quickly *Acute hepatitis panel negative so doubt this is infectious etiology *Suspect this is related to acute infection and associated hypotension i.e. mild shock liver  Disposition *Transfer to telemetry   LOS: 3 days   Erin Hearing, ANP pager [phone]  [facility] [email or web address] Password: [place]  07/11/2011, 12:03 PM    I have examined the patient and reviewed the chart. I have modified the above note and agree with above note.   Debbe Odea, MD

## 2011-07-11 NOTE — Progress Notes (Signed)
Attempted to call Mrs. Pate Noble but her phone is busy.  Will try again later.

## 2011-07-11 NOTE — Progress Notes (Signed)
Physical Therapy Treatment Patient Details Name: [name] MRN: [id number] DOB: January 05, 1929 Today's Date: 07/11/2011 Time: [id number] PT Time Calculation (min): 18 min  PT Assessment / Plan / Recommendation Comments on Treatment Session  Pt adm with sepsis and hypotension.  Pt with baseline mobility defecits and is making good progress.    Follow Up Recommendations  Home health PT;Supervision/Assistance - 24 hour (initially only)    Barriers to Discharge        Equipment Recommendations  None recommended by PT    Recommendations for Other Services    Frequency Min 3X/week   Plan Discharge plan remains appropriate;Frequency remains appropriate    Precautions / Restrictions Precautions Precautions: Fall Restrictions Weight Bearing Restrictions: No   Pertinent Vitals/Pain N/A    Mobility  Transfers Sit to Stand: 5: Supervision;With upper extremity assist;From chair/3-in-1;With armrests Stand to Sit: 5: Supervision;With upper extremity assist;With armrests;To chair/3-in-1 Ambulation/Gait Ambulation/Gait Assistance: 4: Min assist Ambulation Distance (Feet): 120 Feet Assistive device: None;Other (Comment) (wall rail at times) Gait Pattern: Step-to pattern;Decreased stance time - right;Right circumduction;Decreased dorsiflexion - right;Decreased hip/knee flexion - right;Decreased weight shift to right    Exercises     PT Diagnosis:    PT Problem List:   PT Treatment Interventions:     PT Goals Acute Rehab PT Goals PT Goal: Sit to Stand - Progress: Progressing toward goal PT Goal: Stand to Sit - Progress: Progressing toward goal PT Goal: Ambulate - Progress: Progressing toward goal  Visit Information  Last PT Received On: 07/11/11 Assistance Needed: +1    Subjective Data  Subjective: Pt agreeable to amb.   Cognition  Overall Cognitive Status: Impaired Area of Impairment: Awareness of deficits Arousal/Alertness: Awake/alert Behavior During Session: WFL for  tasks performed    Balance  Static Standing Balance Static Standing - Balance Support: No upper extremity supported Static Standing - Level of Assistance: 7: Independent  End of Session PT - End of Session Equipment Utilized During Treatment: Gait belt Activity Tolerance: Patient tolerated treatment well Patient left: in chair;with call bell/phone within reach Nurse Communication: Mobility status    Linh Hedberg 07/11/2011, 10:58 AM  Suanne Marker PT [phone]

## 2011-07-12 ENCOUNTER — Encounter (HOSPITAL_COMMUNITY): Payer: Self-pay | Admitting: Internal Medicine

## 2011-07-12 DIAGNOSIS — Z8679 Personal history of other diseases of the circulatory system: Secondary | ICD-10-CM

## 2011-07-12 DIAGNOSIS — E782 Mixed hyperlipidemia: Secondary | ICD-10-CM

## 2011-07-12 DIAGNOSIS — A4151 Sepsis due to Escherichia coli [E. coli]: Secondary | ICD-10-CM

## 2011-07-12 MED ORDER — SODIUM CHLORIDE 0.9 % IJ SOLN
10.0000 mL | INTRAMUSCULAR | Status: DC | PRN
  Administered 2011-07-14 (×2): 10 mL

## 2011-07-12 NOTE — Progress Notes (Signed)
Peripherally Inserted Central Catheter/Midline Placement  The IV Nurse has discussed with the patient and/or persons authorized to consent for the patient, the purpose of this procedure and the potential benefits and risks involved with this procedure.  The benefits include less needle sticks, lab draws from the catheter and patient may be discharged home with the catheter.  Risks include, but not limited to, infection, bleeding, blood clot (thrombus formation), and puncture of an artery; nerve damage and irregular heat beat.  Alternatives to this procedure were also discussed.  PICC/Midline Placement Documentation  PICC / Midline Single Lumen 07/12/11 Midline Left Basilic (Active)       Holley Bouche Renee 07/12/2011, 3:13 PM

## 2011-07-12 NOTE — Progress Notes (Signed)
[facility] [place] TEAM 1 - Stepdown/ICU TEAM  Brief narrative:  78 yo man with hx of CVA, CABG, depression, brought to the ED after wife noted he was too weak to get OOB  Antibiotics:  Rocephin   Subjective:   Objective: Blood pressure 180/71, pulse 61, temperature 98.5 F (36.9 C), temperature source Oral, resp. rate 17, height 5\' 6"  (1.676 m), weight 89.223 kg (196 lb 11.2 oz), SpO2 97.00%.  Intake/Output from previous day: 05/14 0701 - 05/15 0700 In: 436 [P.O.:270; I.V.:116; IV Piggyback:50] Out: 575 [Urine:575] Intake/Output this shift: Total I/O In: 120 [P.O.:120] Out: -   General appearance: alert, cooperative - no distress Resp: clear to auscultation bilaterally Cardio: regular rate and rhythm-occasionally bradycardic rate in the high 60s GI: soft, non-tender; bowel sounds normal; no masses,  no organomegaly Extremities: extremities normal, atraumatic, no cyanosis or edema Neurologic: Grossly normal  Lab Results:  Basename 07/11/11 0500 07/10/11 0510  WBC 6.6 7.3  HGB 12.2* 11.9*  HCT 34.4* 34.7*  PLT 133* 131*   BMET  Basename 07/11/11 0500 07/10/11 0510  NA 134* 130*  K 3.2* SLIGHT HEMOLYSIS  CL 99 99  CO2 25 19  GLUCOSE 100* 96  BUN 21 28*  CREATININE 1.04 1.10  CALCIUM 8.5 7.9*   Medications:    . aspirin EC  81 mg Oral Daily  . cefTRIAXone (ROCEPHIN)  IV  1 g Intravenous Q24H  . enoxaparin  40 mg Subcutaneous Q24H  . escitalopram  10 mg Oral Daily  . memantine  10 mg Oral Daily  . nebivolol  5 mg Oral Daily  . pantoprazole  20 mg Oral Q1200  . potassium chloride  40 mEq Oral Once  . sodium chloride  3 mL Intravenous Q12H  . sucralfate  1 g Oral Daily  . Tamsulosin HCl  0.4 mg Oral Daily     2D Echo Left ventricle: The cavity size was normal. Systolic function was normal. The estimated ejection fraction was in the range of 55% to 60%. Wall motion was normal; there were no regional wall motion abnormalities. - Left  atrium: The atrium was mildly dilated. - Right ventricle: The cavity size was mildly dilated. - Right atrium: The atrium was mildly dilated. - Atrial septum: No defect or patent foramen ovale was identified. - Tricuspid valve: Moderate regurgitation. - Pulmonary arteries: PA peak pressure: 61mm Hg (S). Impressions:  - The right ventricular systolic pressure was increased consistent with mild pulmonary hypertension. Results for BILLIE, LUEBBERS (MRN [id number]) as of 07/12/2011 16:42  Ref. Range 07/09/2011 05:00 07/10/2011 05:10 07/11/2011 05:00  Alkaline Phosphatase Latest Range: 39-117 U/L 189 (H) 162 (H) 154 (H)  Albumin Latest Range: 3.5-5.2 g/dL 3.0 (L) 2.6 (L) 2.8 (L)  AST Latest Range: 0-37 U/L 300 (H) 108 (H) 51 (H)  ALT Latest Range: 0-53 U/L 323 (H) 199 (H) 139 (H)  Total Protein Latest Range: 6.0-8.3 g/dL 6.3 6.2 6.3    Assessment/Plan:  Sepsis associated hypotension *Resolved *Initial Procalcitonin 54  Bacteremia due to Escherichia coli *Both blood cultures have grown Escherichia coli which is pan sensitive *Change Maxipime to Rocephin and likely will need a total of 10-14 days of total antibiotic therapy - place picc line for home iv abx  UTI? *Urinalysis not that impressive yet blood cultures grew out Escherichia coli and likely source is urinary tract *Urine culture is negative *White cell count is now normal   Toxic metabolic encephalopathy *Resolving and suspect etiology related to acute infection  and dehydration  Acute renal failure *Likely related to dehydration and acute sepsis *Resolving *Baseline creatinine is 1.2-1.5   Iron deficiency anemia secondary to blood loss (chronic) *Hemoglobin stable at 11.9 *Was not on iron replacement prior to admission  HYPERTENSION *Previous hypotension has resolved *Tolerating Bystolic *Resume Flomax *Continue to hold thiazide diuretic  CORONARY ARTERY DISEASE/ S/P CABG x 5 *Stable and asymptomatic *Cardiac  troponin has been negative x4 collections  Hyponatremia *Likely related to dehydration and acute illness as well as concomitant use of thiazide diuretics prior to admission *Continue to follow  Hypokalemia Will replace PO  Paroxysmal a-fib/Bradycardia *Currently rate controlled with ventricular response between 52 and 70 *Watch heart rate with resumption of Bystolic *Echocardiogram with normal EF  HYPERLIPIDEMIA *On Crestor prior to admission  DEPRESSION, SITUATIONAL, SEVERE *Continue escitalopram  CEREBROVASCULAR ACCIDENT WITH RIGHT HEMIPARESIS/Dementia *Continue namenda  GERD *Continue Carafate and Prevacid  Hx of hydronephrosis *Renal ultrasound this admission shows no evidence of hydronephrosis  Hepatitis secondary to mild shock liver *Liver enzymes are trending downward quickly *Acute hepatitis panel negative so doubt this is infectious etiology *Suspect this is related to acute infection and associated hypotension i.e. mild shock liver  Disposition Home with [place]    LOS: 4 days  Takela Varden   07/12/2011, 8:56 AM

## 2011-07-12 NOTE — Progress Notes (Signed)
Physical Therapy Treatment Patient Details Name: ARTRELL MATTO MRN: [id number] DOB: Mar 29, 1928 Today's Date: 07/12/2011 Time: 1204-1219 PT Time Calculation (min): 15 min  PT Assessment / Plan / Recommendation Comments on Treatment Session  78 y.o. male admitted with sepsis and hypotension.  The patient is continuing to increase his gait distance and endurance, but continues to be highly off balance without using an assistive device and is at risk for falls at home.  HHPT f/u at discharge is still appropriate.      Follow Up Recommendations  Home health PT;Supervision/Assistance - 24 hour    Barriers to Discharge  none      Equipment Recommendations  Other (comment) (pt refusing RW)    Recommendations for Other Services  none  Frequency Min 3X/week   Plan Discharge plan remains appropriate;Frequency remains appropriate    Precautions / Restrictions Precautions Precautions: Fall   Pertinent Vitals/Pain No reports of pain, no vitals taken.     Mobility  Bed Mobility Supine to Sit: With rails;HOB flat;6: Modified independent (Device/Increase time) Sitting - Scoot to Edge of Bed: 7: Independent Details for Bed Mobility Assistance: used rail to pull up to EOB extra time needed to complete task, pt struggled, but was able to do it without therapist's assist.    Transfers Sit to Stand: 4: Min assist;From bed;With upper extremity assist Stand to Sit: 4: Min assist;With upper extremity assist;To elevated surface Details for Transfer Assistance: min hand held assist to get to standing.  Patient with posterior lean relying on legs supported against bed for stability.    Ambulation/Gait Ambulation/Gait Assistance: 4: Min assist Ambulation Distance (Feet): 200 Feet Assistive device: 1 person hand held assist Ambulation/Gait Assistance Details: Tried to get pt to use RW, but he adamately refused, he did often in the room and in the hallway reach for a support surface with his free hand.   His is significantly off balance without external support.   Gait Pattern: Step-to pattern Gait velocity: significantly less than 1.8 ft/sec which puts him at risk for recurrent falls.       PT Goals Acute Rehab PT Goals Time For Goal Achievement: 07/17/11 PT Goal: Sit to Stand - Progress: Progressing toward goal PT Goal: Stand to Sit - Progress: Progressing toward goal PT Goal: Ambulate - Progress: Progressing toward goal  Visit Information  Last PT Received On: 07/12/11 Assistance Needed: +1    Subjective Data  Subjective: Pt with expressive difficulties, but able to communicate that he was agreeable to walking.     Cognition  Arousal/Alertness: Awake/alert Cognition - Other Comments: not specifically tested    End of Session PT - End of Session Equipment Utilized During Treatment: Gait belt Activity Tolerance: Patient limited by fatigue Patient left: in chair;with call bell/phone within reach    [place] B. Backus, [place], DPT [phone] 07/12/2011, 2:23 PM

## 2011-07-13 DIAGNOSIS — A4150 Gram-negative sepsis, unspecified: Secondary | ICD-10-CM

## 2011-07-13 DIAGNOSIS — E782 Mixed hyperlipidemia: Secondary | ICD-10-CM

## 2011-07-13 DIAGNOSIS — A4151 Sepsis due to Escherichia coli [E. coli]: Secondary | ICD-10-CM

## 2011-07-13 DIAGNOSIS — Z8679 Personal history of other diseases of the circulatory system: Secondary | ICD-10-CM

## 2011-07-13 LAB — URINE CULTURE
Colony Count: 40000
Culture  Setup Time: 201305131248

## 2011-07-13 LAB — BASIC METABOLIC PANEL
BUN: 22 mg/dL (ref 6–23)
CO2: 26 mEq/L (ref 19–32)
Chloride: 102 mEq/L (ref 96–112)
GFR calc non Af Amer: 76 mL/min — ABNORMAL LOW (ref >90)
Glucose, Bld: 100 mg/dL — ABNORMAL HIGH (ref 70–99)
Potassium: 3.4 mEq/L — ABNORMAL LOW (ref 3.5–5.1)
Sodium: 135 mEq/L (ref 135–145)

## 2011-07-13 LAB — CBC
HCT: 33 % — ABNORMAL LOW (ref 39.0–52.0)
Hemoglobin: 11.6 g/dL — ABNORMAL LOW (ref 13.0–17.0)
MCH: 30.6 pg (ref 26.0–34.0)
MCHC: 35.2 g/dL (ref 30.0–36.0)
MCV: 87.1 fL (ref 78.0–100.0)
RBC: 3.79 MIL/uL — ABNORMAL LOW (ref 4.22–5.81)

## 2011-07-13 NOTE — Progress Notes (Signed)
UR Completed. Llana Aliment, RN, Nurse Case Manager [phone]

## 2011-07-13 NOTE — Progress Notes (Signed)
[facility] PROGRESS NOTE  [name] [id number] DOB: 02-Jan-1929 DOA: 07/08/2011   Assessment/Plan: Sepsis associated hypotension  -Resolved, no pressor needed. -Initial Procalcitonin 54 . -associated with E. Coli bacteremia  Bacteremia due to Escherichia coli  -Both blood cultures have grown Escherichia coli which is pan sensitive  -Change Maxipime to Rocephin and likely will need a total of 10-14 days of total antibiotic therapy  - place picc line for home iv abx   UTI?  -Urinalysis not that impressive yet blood cultures grew out Escherichia coli and likely source is urinary tract  -Urine culture is negative  -White cell count is now normal   Toxic metabolic encephalopathy  -Resolving and suspect etiology related to acute infection and dehydration   Acute renal failure  -Likely related to dehydration and acute sepsis  -Resolving  -Baseline creatinine is 1.2-1.5   Iron deficiency anemia secondary to blood loss (chronic)  -Hemoglobin stable at 11.9  -Was not on iron replacement prior to admission   HYPERTENSION  -Previous hypotension has resolved  -Tolerating Bystolic  -Resume Flomax  -Continue to hold thiazide diuretic   CORONARY ARTERY DISEASE/ S/P CABG x 5  -Stable and asymptomatic  -Cardiac troponin has been negative x4 collections   Hyponatremia  -Likely related to dehydration and acute illness as well as concomitant use of thiazide diuretics prior to admission  -Continue to follow   Hypokalemia  -Will replace PO   Paroxysmal a-fib/Bradycardia  -Currently rate controlled with ventricular response between 52 and 70  -Watch heart rate with resumption of Bystolic  -Echocardiogram with normal EF.  HYPERLIPIDEMIA  -On Crestor prior to admission   DEPRESSION, SITUATIONAL, SEVERE  -Continue escitalopram   CEREBROVASCULAR ACCIDENT WITH RIGHT HEMIPARESIS/Dementia  -Continue namenda   GERD  -Continue Carafate and Prevacid   Hx of  hydronephrosis  -Renal ultrasound this admission shows no evidence of hydronephrosis   Hepatitis secondary to mild shock liver  -Acute hepatitis panel negative so doubt this is infectious etiology  -2/2 Associated hypotension i.e. mild shock liver     Bess Harvest, MD  [facility] Pager [phone]  If 7PM-7AM, please contact night-coverage [email or web address] Password [email or web address] 07/13/2011, 2:00 PM   LOS: 5 days   Procedures:  PICC line 07/12/2011  Antibiotics:  Rocephin 07/08/2011>> 07/21/2011    Subjective: No complains  Objective: Filed Vitals:   07/12/11 1322 07/12/11 2222 07/13/11 0507 07/13/11 1321  BP: 152/74 167/81 172/95 163/72  Pulse: 64 56 55 61  Temp: 97.2 F (36.2 C) 97.8 F (36.6 C) 98.2 F (36.8 C) 98 F (36.7 C)  TempSrc: Oral Oral Oral Oral  Resp: 19 18 16 18   Height:      Weight:   88.27 kg (194 lb 9.6 oz)   SpO2: 99% 96% 98% 100%    Intake/Output Summary (Last 24 hours) at 07/13/11 1400 Last data filed at 07/13/11 1241  Gross per 24 hour  Intake    483 ml  Output    500 ml  Net    -17 ml   Weight change: -1.13 kg (-2 lb 7.9 oz)  Exam:  General: Alert, awake, oriented x1, in no acute distress.  HEENT: No bruits, no goiter.  Heart: Regular rate and rhythm, without murmurs, rubs, gallops.  Lungs: Good air movement, bilateral air movement.  Abdomen: Soft, nontender, nondistended, positive bowel sounds.  Neuro: Grossly intact, nonfocal.   Data Reviewed: Basic Metabolic Panel:  Lab [id number] 07/11/11 0500 07/10/11 0510 07/09/11 0500 07/09/11 07/08/11 1343  NA 135 134* 130* 132* -- 128*  K 3.4* 3.2* -- -- -- --  CL 102 99 99 96 -- 90*  CO2 26 25 19 25  -- 25  GLUCOSE 100* 100* 96 96 -- 117*  BUN 22 21 28* 37* -- 29*  CREATININE 0.98 1.04 1.10 1.66* -- 1.40*  CALCIUM 8.7 8.5 7.9* 8.4 -- 9.2  MG -- -- -- -- 2.1 --  PHOS -- -- -- -- -- --   Liver Function Tests:  Lab 07/11/11 0500 07/10/11 0510 07/09/11 0500 07/08/11  1343  AST 51* 108* 300* 769*  ALT 139* 199* 323* 518*  ALKPHOS 154* 162* 189* 256*  BILITOT 0.5 0.6 1.0 2.1*  PROT 6.3 6.2 6.3 7.4  ALBUMIN 2.8* 2.6* 3.0* 3.6   No results found for this basename: LIPASE:5,AMYLASE:5 in the last 168 hours  Lab 07/08/11 1553  AMMONIA 29   CBC:  Lab 07/13/11 0445 07/11/11 0500 07/10/11 0510 07/09/11 0500 07/08/11 1343  WBC 7.3 6.6 7.3 14.1* 6.5  NEUTROABS -- -- -- -- 6.2  HGB 11.6* 12.2* 11.9* 12.5* 14.2  HCT 33.0* 34.4* 34.7* 35.8* 39.3  MCV 87.1 87.3 89.4 88.4 86.4  PLT 158 133* 131* 131* 130*   Cardiac Enzymes:  Lab 07/09/11 0500 07/09/11 07/08/11 1752 07/08/11 1554 07/08/11 1516  CKTOTAL 429* 456* 276* -- 110  CKMB 9.0* 8.9* 4.2* -- --  CKMBINDEX -- -- -- -- --  TROPONINI <0.30 <0.30 <0.30 <0.30 --   BNP: No components found with this basename: POCBNP:5 CBG: No results found for this basename: GLUCAP:5 in the last 168 hours  Recent Results (from the past 240 hour(s))  CULTURE, BLOOD (ROUTINE X 2)     Status: Normal   Collection Time   07/08/11  1:50 PM      Component Value Range Status Comment   Specimen Description BLOOD LEFT ARM   Final    Special Requests BOTTLES DRAWN AEROBIC AND ANAEROBIC 10CC   Final    Culture  Setup Time [id number]   Final    Culture     Final    Value: ESCHERICHIA COLI     Note: Gram Stain Report Called to,Read Back By and Verified With: Purcell Nails RN on 07/09/11 at 05:20 by Rise Mu   Report Status 07/11/2011 FINAL   Final    Organism ID, Bacteria ESCHERICHIA COLI   Final   CULTURE, BLOOD (ROUTINE X 2)     Status: Normal   Collection Time   07/08/11  1:54 PM      Component Value Range Status Comment   Specimen Description BLOOD LEFT HAND   Final    Special Requests BOTTLES DRAWN AEROBIC AND ANAEROBIC 10CC   Final    Culture  Setup Time [id number]   Final    Culture     Final    Value: ESCHERICHIA COLI     Note: SUSCEPTIBILITIES PERFORMED ON PREVIOUS CULTURE WITHIN THE LAST 5 DAYS.     Note:  Gram Stain Report Called to,Read Back By and Verified With: Purcell Nails RN on 07/09/11 at 05:20 by Rise Mu   Report Status 07/11/2011 FINAL   Final   MRSA PCR SCREENING     Status: Normal   Collection Time   07/08/11  5:36 PM      Component Value Range Status Comment   MRSA by PCR NEGATIVE  NEGATIVE  Final   CLOSTRIDIUM DIFFICILE BY PCR     Status: Normal   Collection Time  07/09/11 12:06 AM      Component Value Range Status Comment   C difficile by pcr NEGATIVE  NEGATIVE  Final   URINE CULTURE     Status: Normal   Collection Time   07/10/11 12:19 PM      Component Value Range Status Comment   Specimen Description URINE, CLEAN CATCH   Final    Special Requests NONE   Final    Culture  Setup Time [id number]   Final    Colony Count 40,000 COLONIES/ML   Final    Culture ENTEROCOCCUS SPECIES   Final    Report Status 07/13/2011 FINAL   Final    Organism ID, Bacteria ENTEROCOCCUS SPECIES   Final      Studies: US Abdomen Complete  07/09/2011  *RADIOLOGY REPORT*  Clinical Data:  Abnormal LFTs  COMPLETE ABDOMINAL ULTRASOUND  Comparison:  CT scan 03/28/2005  Findings:  Gallbladder:  Surgically absent  Common bile duct:  Measures 6.7 mm mid aspect, distally measures 9.1 mm in diameter.  Liver:  Liver shows coarse diffuse increased echogenicity which may be due to fatty infiltration or chronic hepatitis.  IVC:  Appears normal.  Pancreas:  Somewhat heterogeneous without definite focal abnormality.  Spleen:  Measures 6 cm in length.  Normal echogenicity.  Right Kidney:  Measures 12 cm in length.  At least three cysts are noted within the right kidney the largest measures 6.4 x 6 cm. No hydronephrosis or diagnostic renal calculus.  Left Kidney:  Measures 13,4 cm in length.  No hydronephrosis or diagnostic renal calculus.  Multiple cysts are noted than the largest mid pole region measures 4.7 x 5.9 x 4.7 cm.  Abdominal aorta:  No aneurysm identified. Limited visualization due to abundant bowel gas.   Visualized abdominal aorta measures 2 cm in diameter.  Atherosclerotic calcification of the aortic wall.  IMPRESSION:  1.  Surgically absent gallbladder. 2.  Mild prominent size CBD probable post cholecystectomy. 3.  Diffuse increased echogenicity of the liver without focal mass. This may be due to fatty infiltration or chronic hepatitis. 4.  No hydronephrosis or diagnostic renal calculus.  Bilateral renal cysts are noted.  Original Report Authenticated By: Lahoma Crocker, M.D.   Dg Chest Port 1 View  07/09/2011  *RADIOLOGY REPORT*  Clinical Data: Rule out congestive heart failure  PORTABLE CHEST - 1 VIEW  Comparison: Chest radiograph 07/08/2011  Findings:  Sternal wires overlie stable cardiac silhouette.  There is mild bibasilar air space disease suggesting mild edema. No focal consolidation.  No pneumothorax.  IMPRESSION: Mild progressive air space disease at the lung bases suggesting edema.  Original Report Authenticated By: Suzy Bouchard, M.D.   Dg Chest Portable 1 View  07/08/2011  *RADIOLOGY REPORT*  Clinical Data: Fever, weakness  PORTABLE CHEST - 1 VIEW  Comparison: 12/16/2006  Findings: Borderline cardiomegaly noted.  The patient is status post CABG.  Central mild vascular congestion without convincing pulmonary edema.  Streaky left basilar atelectasis or infiltrate. Mild right basilar atelectasis.  IMPRESSION: No convincing pulmonary edema.  Borderline cardiomegaly.  Streaky left basilar atelectasis or infiltrate.  Original Report Authenticated By: Lahoma Crocker, M.D.    Scheduled Meds:   . aspirin EC  81 mg Oral Daily  . cefTRIAXone (ROCEPHIN)  IV  1 g Intravenous Q24H  . enoxaparin  40 mg Subcutaneous Q24H  . escitalopram  10 mg Oral Daily  . memantine  10 mg Oral Daily  . nebivolol  5 mg Oral Daily  . pantoprazole  20 mg Oral Q1200  . sodium chloride  3 mL Intravenous Q12H  . sucralfate  1 g Oral Daily  . Tamsulosin HCl  0.4 mg Oral Daily   Continuous Infusions:

## 2011-07-14 DIAGNOSIS — A4150 Gram-negative sepsis, unspecified: Secondary | ICD-10-CM

## 2011-07-14 DIAGNOSIS — Z8679 Personal history of other diseases of the circulatory system: Secondary | ICD-10-CM

## 2011-07-14 DIAGNOSIS — A4151 Sepsis due to Escherichia coli [E. coli]: Secondary | ICD-10-CM

## 2011-07-14 DIAGNOSIS — E782 Mixed hyperlipidemia: Secondary | ICD-10-CM

## 2011-07-14 LAB — BASIC METABOLIC PANEL
BUN: 18 mg/dL (ref 6–23)
CO2: 25 mEq/L (ref 19–32)
Calcium: 8.7 mg/dL (ref 8.4–10.5)
Creatinine, Ser: 0.9 mg/dL (ref 0.50–1.35)
GFR calc non Af Amer: 78 mL/min — ABNORMAL LOW (ref >90)
Glucose, Bld: 102 mg/dL — ABNORMAL HIGH (ref 70–99)

## 2011-07-14 LAB — CBC
MCH: 30.8 pg (ref 26.0–34.0)
MCHC: 35.4 g/dL (ref 30.0–36.0)
MCV: 86.9 fL (ref 78.0–100.0)
Platelets: 176 10*3/uL (ref 150–400)

## 2011-07-14 MED ORDER — HEPARIN SOD (PORK) LOCK FLUSH 100 UNIT/ML IV SOLN
250.0000 [IU] | Freq: Every day | INTRAVENOUS | Status: DC
  Filled 2011-07-14: qty 3

## 2011-07-14 MED ORDER — NEBIVOLOL HCL 2.5 MG PO TABS: 2.5000 mg | ORAL_TABLET | Freq: Every day | ORAL | Status: DC

## 2011-07-14 MED ORDER — DEXTROSE 5 % IV SOLN: 1.0000 g | INTRAVENOUS | Status: DC

## 2011-07-14 MED ORDER — DEXTROSE 5 % IV SOLN: 1.0000 g | INTRAVENOUS | Status: AC

## 2011-07-14 MED ORDER — NEBIVOLOL HCL 2.5 MG PO TABS
2.5000 mg | ORAL_TABLET | Freq: Every day | ORAL | Status: DC
  Filled 2011-07-14: qty 1

## 2011-07-14 MED ORDER — HEPARIN SOD (PORK) LOCK FLUSH 100 UNIT/ML IV SOLN
250.0000 [IU] | INTRAVENOUS | Status: DC | PRN
  Administered 2011-07-14: 250 [IU]
  Filled 2011-07-14: qty 3

## 2011-07-14 NOTE — Progress Notes (Signed)
Physical Therapy Treatment Patient Details Name: [name] MRN: [id number] DOB: 1928-10-20 Today's Date: 07/14/2011 Time: [id number] PT Time Calculation (min): 21 min  PT Assessment / Plan / Recommendation Comments on Treatment Session  Initially pt impulsive sitting up and then standing prior to being instructed to.  Pt did not seem to understand when I told him to wait for me to get room prepared.      Follow Up Recommendations  No PT follow up;Supervision - Intermittent    Barriers to Discharge        Equipment Recommendations  None recommended by PT    Recommendations for Other Services    Frequency Min 2X/week   Plan Discharge plan needs to be updated;Frequency needs to be updated    Precautions / Restrictions Precautions Precautions: Fall Restrictions Weight Bearing Restrictions: No   Pertinent Vitals/Pain Pt denied pain.      Mobility  Bed Mobility Bed Mobility: Supine to Sit Supine to Sit: 6: Modified independent (Device/Increase time);With rails;HOB flat Sitting - Scoot to Edge of Bed: 7: Independent Details for Bed Mobility Assistance: Pt used rail but required less effort than prior sessions.  Transfers Transfers: Sit to Stand;Stand to Sit Sit to Stand: 5: Supervision;With upper extremity assist;From bed (2 trials) Stand to Sit: 5: Supervision;To chair/3-in-1;To bed Details for Transfer Assistance: Pt demonstrates safety with sit-stand and stand-sit on bed. Pt required verbal cues for hand placement and controlled descent to sit in lower chair.  Pt does not use RUE to assist in sitting.  Ambulation/Gait Ambulation/Gait Assistance: 5: Supervision;6: Modified independent (Device/Increase time) Ambulation Distance (Feet): 400 Feet (200 w/RW; 200 with no AD) Assistive device: Rolling walker;None Ambulation/Gait Assistance Details: Modified independent with RW.  Supervision for safety with no AD.  No LOB throughout session. Pt demonstrates good bilateral foot  clearance.  Stride length decreased with no AD.  Gait Pattern: Step-through pattern;Decreased stride length Gait velocity: significantly less than 1.8 ft/sec which puts him at risk for recurrent falls.  Stairs: No Wheelchair Mobility Wheelchair Mobility: No    Exercises     PT Diagnosis:    PT Problem List:   PT Treatment Interventions:     PT Goals Acute Rehab PT Goals Time For Goal Achievement: 07/17/11 Potential to Achieve Goals: Good Pt will go Sit to Stand: Independently;with upper extremity assist PT Goal: Sit to Stand - Progress: Progressing toward goal Pt will go Stand to Sit: Independently;with upper extremity assist PT Goal: Stand to Sit - Progress: Progressing toward goal Pt will Ambulate: >150 feet;with supervision PT Goal: Ambulate - Progress: Met  Visit Information  Last PT Received On: 07/14/11 Assistance Needed: +1    Subjective Data  Subjective: Pt with expressive difficulties, but able to communicate that he was agreeable to walking.     Cognition  Overall Cognitive Status: Appears within functional limits for tasks assessed/performed Arousal/Alertness: Awake/alert Orientation Level: Appears intact for tasks assessed Behavior During Session: [facility] for tasks performed Cognition - Other Comments: not specifically tested    Balance  Balance Balance Assessed: No  End of Session PT - End of Session Equipment Utilized During Treatment: Gait belt Activity Tolerance: Patient tolerated treatment well Patient left: in chair;with call bell/phone within reach;with chair alarm set Nurse Communication: Mobility status    Ovide Dusek 07/14/2011, 10:40 AM Collier Salina L. Terryn Redner DPT [phone]

## 2011-07-14 NOTE — Discharge Summary (Addendum)
Physician Discharge Summary  [name] [id number] DOB: June 17, 1928 DOA: 07/08/2011  PCP: [name], MD, MD  Admit date: 07/08/2011 Discharge date: 07/14/2011  Discharge Diagnoses:  Principal Problem:  *Bacteremia due to Escherichia coli Active Problems:  HYPERLIPIDEMIA  Iron deficiency anemia secondary to blood loss (chronic)  DEPRESSION, SITUATIONAL, SEVERE  HYPERTENSION  CORONARY ARTERY DISEASE  CEREBROVASCULAR ACCIDENT WITH RIGHT HEMIPARESIS  GERD  Fever 41 degrees C or over  Hx of hydronephrosis  S/P CABG x 5  Hepatitis  UTI (lower urinary tract infection)  Sepsis associated hypotension  Toxic metabolic encephalopathy  Hyponatremia  Paroxysmal a-fib  Bradycardia   Discharge Condition: Stable  Disposition:  He will follow up with his primary care doctor in 2 weeks. We'll see here and he is doing as he had fevers. And if not normal the PICC line. We'll also get a CBC.   History of present illness:  78 yo man with MMP, including hx of CVA, CABG, depression, brought to the ED after wife noted he was too weak to get OOB. He is currently alert but i am unable to understand what he is saying. He does not c/o anything, but in the ED his BP is low and T is 104. Urine is dark. Wife also noted he vomited once today.  Hospital Course:  Principal Problem:  *Bacteremia due to Escherichia coli: -Both blood cultures have grown Escherichia coli . -Change Maxipime to Rocephin and likely will need a total of 10-14 days of total antibiotic therapy , PICC in place.    Iron deficiency anemia secondary to blood loss (chronic): -Hemoglobin stable at 11.9  -Was not on iron replacement prior to admission home on ferrous sulfate.   HYPERTENSION: -Previously hypotensive blood pressure medications held on admission after his blood pressure started to trend up she was changed to his home medication which she will continue.   S/P CABG x 5 -Stable and asymptomatic  -Cardiac  troponin has been negative x4 collections    Hepatitis: -Acute hepatitis panel negative so doubt this is infectious etiology  -2/2 Associated hypotension i.e. mild shock liver    UTI (lower urinary tract infection): Urine culture grew enterococcus, which was sensitive to Rocephin he'll continue treatment for 14 days.   Sepsis associated hypotension -Resolved, no pressor needed.  -Initial Procalcitonin 54 .  -associated with E. Coli bacteremia   Toxic metabolic encephalopathy: -Most likely secondary to acute infectious etiology and dehydration does resolve.    Hyponatremia -Likely secondary to decreased intravascular volume does resolve with IV fluids.   Paroxysmal a-fib/ Bradycardia --Currently rate controlled with ventricular response between 52 and 70  -Echocardiogram with normal EF.   Discharge Exam: Filed Vitals:   07/14/11 0436  BP: 165/89  Pulse: 65  Temp: 98.7 F (37.1 C)  Resp: 20   Filed Vitals:   07/13/11 0507 07/13/11 1321 07/13/11 2101 07/14/11 0436  BP: 172/95 163/72 170/86 165/89  Pulse: 55 61 63 65  Temp: 98.2 F (36.8 C) 98 F (36.7 C) 98.5 F (36.9 C) 98.7 F (37.1 C)  TempSrc: Oral Oral Oral Oral  Resp: 16 18 18 20   Height:      Weight: 88.27 kg (194 lb 9.6 oz)   88.8 kg (195 lb 12.3 oz)  SpO2: 98% 100% 98% 98%   General: Alert, awake, oriented x1, in no acute distress.  HEENT: No bruits, no goiter.  Heart: Regular rate and rhythm, without murmurs, rubs, gallops.  Lungs: Good air movement, bilateral air movement.  Abdomen: Soft, nontender, nondistended, positive bowel sounds.  Neuro: Grossly intact, nonfocal.   Discharge Instructions  Discharge Orders    Future Orders Please Complete By Expires   Diet - low sodium heart healthy      Increase activity slowly        Medication List  As of 07/14/2011  9:35 AM   TAKE these medications         aspirin 81 MG tablet   Take 81 mg by mouth daily.      CARAFATE 1 G tablet   Generic drug:  sucralfate   Take 1 g by mouth daily.      dextrose 5 % SOLN 50 mL with cefTRIAXone 1 G SOLR 1 g   Inject 1 g into the vein daily.      escitalopram 20 MG tablet   Commonly known as: LEXAPRO   Take 20 mg by mouth daily.      lansoprazole 15 MG capsule   Commonly known as: PREVACID   Take 15 mg by mouth daily.      memantine 10 MG tablet   Commonly known as: NAMENDA   Take 10 mg by mouth daily.      METRONIDAZOLE (TOPICAL) 0.75 % Lotn   Apply topically daily.      multivitamin tablet   Take 1 tablet by mouth daily.      nebivolol 5 MG tablet   Commonly known as: BYSTOLIC   Take 5 mg by mouth daily.      nebivolol 2.5 MG tablet   Commonly known as: BYSTOLIC   Take 1 tablet (2.5 mg total) by mouth daily.      rosuvastatin 20 MG tablet   Commonly known as: CRESTOR   Take 10 mg by mouth daily.      Tamsulosin HCl 0.4 MG Caps   Commonly known as: FLOMAX   Take 0.4 mg by mouth daily.      triamterene-hydrochlorothiazide 37.5-25 MG per tablet   Commonly known as: MAXZIDE-25   Take 1 tablet by mouth every morning.      Vitamin D 1000 UNITS capsule   Take 2,000 Units by mouth daily.           Follow-up Information    Follow up with [name], MD in 2 weeks.   Contact information:   [place] [phone]           The results of significant diagnostics from this hospitalization (including imaging, microbiology, ancillary and laboratory) are listed below for reference.    Significant Diagnostic Studies: US Abdomen Complete  07/09/2011  *RADIOLOGY REPORT*  Clinical Data:  Abnormal LFTs  COMPLETE ABDOMINAL ULTRASOUND  Comparison:  CT scan 03/28/2005  Findings:  Gallbladder:  Surgically absent  Common bile duct:  Measures 6.7 mm mid aspect, distally measures 9.1 mm in diameter.  Liver:  Liver shows coarse diffuse increased echogenicity which may be due to fatty infiltration or chronic hepatitis.  IVC:   Appears normal.  Pancreas:  Somewhat heterogeneous without definite focal abnormality.  Spleen:  Measures 6 cm in length.  Normal echogenicity.  Right Kidney:  Measures 12 cm in length.  At least three cysts are noted within the right kidney the largest measures 6.4 x 6 cm. No hydronephrosis or diagnostic renal calculus.  Left Kidney:  Measures 13,4 cm in length.  No hydronephrosis or diagnostic renal calculus.  Multiple cysts are noted than the largest mid pole region measures  4.7 x 5.9 x 4.7 cm.  Abdominal aorta:  No aneurysm identified. Limited visualization due to abundant bowel gas.  Visualized abdominal aorta measures 2 cm in diameter.  Atherosclerotic calcification of the aortic wall.  IMPRESSION:  1.  Surgically absent gallbladder. 2.  Mild prominent size CBD probable post cholecystectomy. 3.  Diffuse increased echogenicity of the liver without focal mass. This may be due to fatty infiltration or chronic hepatitis. 4.  No hydronephrosis or diagnostic renal calculus.  Bilateral renal cysts are noted.  Original Report Authenticated By: Lahoma Crocker, M.D.   Dg Chest Port 1 View  07/09/2011  *RADIOLOGY REPORT*  Clinical Data: Rule out congestive heart failure  PORTABLE CHEST - 1 VIEW  Comparison: Chest radiograph 07/08/2011  Findings:  Sternal wires overlie stable cardiac silhouette.  There is mild bibasilar air space disease suggesting mild edema. No focal consolidation.  No pneumothorax.  IMPRESSION: Mild progressive air space disease at the lung bases suggesting edema.  Original Report Authenticated By: Suzy Bouchard, M.D.   Dg Chest Portable 1 View  07/08/2011  *RADIOLOGY REPORT*  Clinical Data: Fever, weakness  PORTABLE CHEST - 1 VIEW  Comparison: 12/16/2006  Findings: Borderline cardiomegaly noted.  The patient is status post CABG.  Central mild vascular congestion without convincing pulmonary edema.  Streaky left basilar atelectasis or infiltrate. Mild right basilar atelectasis.  IMPRESSION: No  convincing pulmonary edema.  Borderline cardiomegaly.  Streaky left basilar atelectasis or infiltrate.  Original Report Authenticated By: Lahoma Crocker, M.D.    Microbiology: Recent Results (from the past 240 hour(s))  CULTURE, BLOOD (ROUTINE X 2)     Status: Normal   Collection Time   07/08/11  1:50 PM      Component Value Range Status Comment   Specimen Description BLOOD LEFT ARM   Final    Special Requests BOTTLES DRAWN AEROBIC AND ANAEROBIC 10CC   Final    Culture  Setup Time [id number]   Final    Culture     Final    Value: ESCHERICHIA COLI     Note: Gram Stain Report Called to,Read Back By and Verified With: Purcell Nails RN on 07/09/11 at 05:20 by Rise Mu   Report Status 07/11/2011 FINAL   Final    Organism ID, Bacteria ESCHERICHIA COLI   Final   CULTURE, BLOOD (ROUTINE X 2)     Status: Normal   Collection Time   07/08/11  1:54 PM      Component Value Range Status Comment   Specimen Description BLOOD LEFT HAND   Final    Special Requests BOTTLES DRAWN AEROBIC AND ANAEROBIC 10CC   Final    Culture  Setup Time [id number]   Final    Culture     Final    Value: ESCHERICHIA COLI     Note: SUSCEPTIBILITIES PERFORMED ON PREVIOUS CULTURE WITHIN THE LAST 5 DAYS.     Note: Gram Stain Report Called to,Read Back By and Verified With: Purcell Nails RN on 07/09/11 at 05:20 by Rise Mu   Report Status 07/11/2011 FINAL   Final   MRSA PCR SCREENING     Status: Normal   Collection Time   07/08/11  5:36 PM      Component Value Range Status Comment   MRSA by PCR NEGATIVE  NEGATIVE  Final   CLOSTRIDIUM DIFFICILE BY PCR     Status: Normal   Collection Time   07/09/11 12:06 AM      Component Value Range Status Comment  C difficile by pcr NEGATIVE  NEGATIVE  Final   URINE CULTURE     Status: Normal   Collection Time   07/10/11 12:19 PM      Component Value Range Status Comment   Specimen Description URINE, CLEAN CATCH   Final    Special Requests NONE   Final    Culture  Setup Time  [id number]   Final    Colony Count 40,000 COLONIES/ML   Final    Culture ENTEROCOCCUS SPECIES   Final    Report Status 07/13/2011 FINAL   Final    Organism ID, Bacteria ENTEROCOCCUS SPECIES   Final      Labs: Basic Metabolic Panel:  Lab [id number] 07/13/11 0445 07/11/11 0500 07/10/11 0510 07/09/11 0500 07/09/11  NA 133* 135 134* 130* 132* --  K 3.5 3.4* -- -- -- --  CL 99 102 99 99 96 --  CO2 25 26 25 19 25  --  GLUCOSE 102* 100* 100* 96 96 --  BUN 18 22 21  28* 37* --  CREATININE 0.90 0.98 1.04 1.10 1.66* --  CALCIUM 8.7 8.7 8.5 7.9* 8.4 --  MG -- -- -- -- -- 2.1  PHOS -- -- -- -- -- --   Liver Function Tests:  Lab 07/11/11 0500 07/10/11 0510 07/09/11 0500 07/08/11 1343  AST 51* 108* 300* 769*  ALT 139* 199* 323* 518*  ALKPHOS 154* 162* 189* 256*  BILITOT 0.5 0.6 1.0 2.1*  PROT 6.3 6.2 6.3 7.4  ALBUMIN 2.8* 2.6* 3.0* 3.6   No results found for this basename: LIPASE:5,AMYLASE:5 in the last 168 hours  Lab 07/08/11 1553  AMMONIA 29   CBC:  Lab 07/14/11 0709 07/13/11 0445 07/11/11 0500 07/10/11 0510 07/09/11 0500 07/08/11 1343  WBC 6.3 7.3 6.6 7.3 14.1* --  NEUTROABS -- -- -- -- -- 6.2  HGB 11.8* 11.6* 12.2* 11.9* 12.5* --  HCT 33.3* 33.0* 34.4* 34.7* 35.8* --  MCV 86.9 87.1 87.3 89.4 88.4 --  PLT 176 158 133* 131* 131* --   Cardiac Enzymes:  Lab 07/09/11 0500 07/09/11 07/08/11 1752 07/08/11 1554 07/08/11 1516  CKTOTAL 429* 456* 276* -- 110  CKMB 9.0* 8.9* 4.2* -- --  CKMBINDEX -- -- -- -- --  TROPONINI <0.30 <0.30 <0.30 <0.30 --   BNP: No components found with this basename: POCBNP:5 CBG: No results found for this basename: GLUCAP:5 in the last 168 hours  Time coordinating discharge:  greater than 40 minutes.  Signed:  FELIZ ORTIZ, [facility] 07/14/2011, 9:35 AM

## 2011-07-14 NOTE — Progress Notes (Signed)
Patient heart rate 37 on monitor, 48 manually.  Client is easily awakened and alert.  Speech is difficult to understand, client is s/p CVA.  Wife has been alerted to transport need for discharge.  Change in beta blocker ordered per MD.

## 2011-07-19 ENCOUNTER — Telehealth: Payer: Self-pay | Admitting: Family Medicine

## 2011-07-19 NOTE — Telephone Encounter (Signed)
Home RN set to pull pic line Monday. Please call to let her know if there are any further lab orders you'd like her to draw from the line before she pulls it. Thanks!

## 2011-07-21 ENCOUNTER — Telehealth: Payer: Self-pay | Admitting: Family Medicine

## 2011-07-21 NOTE — Telephone Encounter (Signed)
Just to follow up on our phone conversation. Pt needs hosp f/u ASAP - please call wife with appt. Thanks!

## 2011-07-21 NOTE — Telephone Encounter (Signed)
Appointment made for 5-28 at 28 and wife informed

## 2011-07-21 NOTE — Telephone Encounter (Signed)
Per dr Arnoldo Morale- draw cbc and b met- Left message on machine For kelli to draw before removing pic line

## 2011-07-25 ENCOUNTER — Ambulatory Visit (INDEPENDENT_AMBULATORY_CARE_PROVIDER_SITE_OTHER): Payer: Medicare Other | Admitting: Internal Medicine

## 2011-07-25 ENCOUNTER — Encounter: Payer: Self-pay | Admitting: Internal Medicine

## 2011-07-25 VITALS — BP 114/72 | HR 60 | Temp 98.1°F | Resp 16 | Ht 72.0 in | Wt 188.0 lb

## 2011-07-25 DIAGNOSIS — R001 Bradycardia, unspecified: Secondary | ICD-10-CM

## 2011-07-25 DIAGNOSIS — A498 Other bacterial infections of unspecified site: Secondary | ICD-10-CM

## 2011-07-25 DIAGNOSIS — R7881 Bacteremia: Secondary | ICD-10-CM

## 2011-07-25 DIAGNOSIS — I498 Other specified cardiac arrhythmias: Secondary | ICD-10-CM

## 2011-07-25 DIAGNOSIS — I951 Orthostatic hypotension: Secondary | ICD-10-CM

## 2011-07-25 MED ORDER — TRIAMTERENE-HCTZ 37.5-25 MG PO TABS: 0.5000 | ORAL_TABLET | Freq: Every morning | ORAL | Status: DC

## 2011-07-25 NOTE — Progress Notes (Signed)
Subjective:    [name], male    DOB: 14-Oct-1928, 78 y.o.   MRN: [id number]  HPI  Patient is an 78 year old white male who presents for followup from a hospitalization for urosepsis most probably prostate infection in origin. He had Escherichia coli sepsis and was admitted to the hospital for fluid resuscitation and antibiotic therapy a PICC line was placed and he had a total 14 days of therapy.  He is doing well now we had a white cell count checked by home health care prior to his coming in and his white cell count was 4.3 his hemoglobin was stable at 13 of note was a low sodium and a high potassium he is on Maxide  Review of Systems  Constitutional: Negative for fever and fatigue.  HENT: Negative for hearing loss, congestion, neck pain and postnasal drip.   Eyes: Negative for discharge, redness and visual disturbance.  Respiratory: Negative for cough, shortness of breath and wheezing.   Cardiovascular: Negative for leg swelling.  Gastrointestinal: Negative for abdominal pain, constipation and abdominal distention.  Genitourinary: Negative for urgency and frequency.  Musculoskeletal: Negative for joint swelling and arthralgias.  Skin: Negative for color change and rash.  Neurological: Positive for speech difficulty and light-headedness. Negative for weakness.  Hematological: Negative for adenopathy.  Psychiatric/Behavioral: Negative for behavioral problems.   Past Medical History  Diagnosis Date  . GERD (gastroesophageal reflux disease)   . Hyperlipidemia   . Hypertension   . CAD (coronary artery disease)   . Stroke     History   Social History  . Marital Status: Married    Spouse Name: N/A    Number of Children: N/A  . Years of Education: N/A   Occupational History  . Not on file.   Social History Main Topics  . Smoking status: Former Research scientist (life sciences)  . Smokeless tobacco: Not on file  . Alcohol Use: No  . Drug Use: No  . Sexually Active: Not Currently    Other Topics Concern  . Not on file   Social History Narrative  . No narrative on file    Past Surgical History  Procedure Date  . Coronary artery bypass graft     x 5  . Tonsillectomy   . Band hemorrhoidectomy   . Dental implants   . Abdominal surgery   . Back surgery   . Hernia repair   . Kidney surgery     stenosis with stent  . Cholecystectomy     Family History  Problem Relation Age of Onset  . Heart attack      fhx  . Coronary artery disease      fhx  . Heart disease Father     Allergies  Allergen Reactions  . Ciprofloxacin Hcl   . Sulfamethoxazole W-Trimethoprim     Current Outpatient Prescriptions on File Prior to Visit  Medication Sig Dispense Refill  . aspirin 81 MG tablet Take 81 mg by mouth daily.        . Cholecalciferol (VITAMIN D) 1000 UNITS capsule Take 2,000 Units by mouth daily.        Marland Kitchen escitalopram (LEXAPRO) 20 MG tablet Take 20 mg by mouth daily.      . lansoprazole (PREVACID) 15 MG capsule Take 15 mg by mouth daily.        . memantine (NAMENDA) 10 MG tablet Take 10 mg by mouth daily.      Marland Kitchen METRONIDAZOLE, TOPICAL, 0.75 % LOTN Apply topically daily.        Marland Kitchen  Multiple Vitamin (MULTIVITAMIN) tablet Take 1 tablet by mouth daily.        . rosuvastatin (CRESTOR) 20 MG tablet Take 10 mg by mouth daily.      . sucralfate (CARAFATE) 1 G tablet Take 1 g by mouth daily.       . Tamsulosin HCl (FLOMAX) 0.4 MG CAPS Take 0.4 mg by mouth daily.      Marland Kitchen DISCONTD: triamterene-hydrochlorothiazide (MAXZIDE-25) 37.5-25 MG per tablet Take 1 tablet by mouth every morning.      Marland Kitchen DISCONTD: triamterene-hydrochlorothiazide (MAXZIDE-25) 37.5-25 MG per tablet Take 1 each (1 tablet total) by mouth every morning.  30 tablet  11   Current Facility-Administered Medications on File Prior to Visit  Medication Dose Route Frequency Provider Last Rate Last Dose  . methylPREDNISolone acetate PF (DEPO-MEDROL) injection 40 mg  40 mg Intra-articular Once Ricard Dillon, MD         BP 114/72  Pulse 60  Temp 98.1 F (36.7 C)  Resp 16  Ht 6' (1.829 m)  Wt 188 lb (85.276 kg)  BMI 25.50 kg/m2        Objective:   Physical Exam  Nursing note and vitals reviewed. Constitutional: He appears well-developed and well-nourished.  HENT:  Head: Normocephalic and atraumatic.  Eyes: Conjunctivae are normal. Pupils are equal, round, and reactive to light.  Neck: Normal range of motion. Neck supple.  Cardiovascular: Normal rate and regular rhythm.   Pulmonary/Chest: Effort normal and breath sounds normal.  Abdominal: Soft. Bowel sounds are normal.          Assessment & Plan:  Reviewed the blood work from [facility] yesterday CBC stable, WBC low, mild monocyte increased ( allergies) Elevated K on maxide Mild persistent hypotension and bradycardia Stop bystollic and cut maxide in 1/2 with blood work and follow up in6 weels

## 2011-07-25 NOTE — Patient Instructions (Addendum)
Stop the bystollic  Change the Maxzide to 1/2 tablet

## 2011-07-28 ENCOUNTER — Other Ambulatory Visit: Payer: Self-pay | Admitting: Internal Medicine

## 2011-07-28 ENCOUNTER — Telehealth: Payer: Self-pay

## 2011-07-28 MED ORDER — ESCITALOPRAM OXALATE 20 MG PO TABS: 20.0000 mg | ORAL_TABLET | Freq: Every day | ORAL | Status: DC

## 2011-07-28 NOTE — Telephone Encounter (Signed)
Patients wife called stating patient was recently in the hospital and they took patients medication. Patient never got the Lexapro back. Pts wife would like a new RX sent to pharmacy. I will send.

## 2011-08-01 ENCOUNTER — Encounter: Payer: Self-pay | Admitting: Internal Medicine

## 2011-09-05 ENCOUNTER — Ambulatory Visit (INDEPENDENT_AMBULATORY_CARE_PROVIDER_SITE_OTHER): Payer: Medicare Other | Admitting: Internal Medicine

## 2011-09-05 ENCOUNTER — Encounter: Payer: Self-pay | Admitting: Internal Medicine

## 2011-09-05 VITALS — BP 136/80 | HR 68 | Temp 98.3°F | Resp 16 | Ht 72.0 in | Wt 195.0 lb

## 2011-09-05 DIAGNOSIS — M169 Osteoarthritis of hip, unspecified: Secondary | ICD-10-CM

## 2011-09-05 DIAGNOSIS — I6990 Unspecified sequelae of unspecified cerebrovascular disease: Secondary | ICD-10-CM

## 2011-09-05 DIAGNOSIS — M25559 Pain in unspecified hip: Secondary | ICD-10-CM

## 2011-09-05 DIAGNOSIS — M19049 Primary osteoarthritis, unspecified hand: Secondary | ICD-10-CM

## 2011-09-05 DIAGNOSIS — M549 Dorsalgia, unspecified: Secondary | ICD-10-CM

## 2011-09-05 MED ORDER — METHYLPREDNISOLONE ACETATE 40 MG/ML IJ SUSP: 40.0000 mg | Freq: Once | INTRAMUSCULAR | Status: DC

## 2011-09-05 NOTE — Progress Notes (Signed)
  Subjective:    [name], male    DOB: 07-10-1928, 78 y.o.   MRN: [id number]  HPI Patient is an 78 year old man with a expressive aphasia due to stroke chronic low back pain history of hypertension gastroesophageal reflux who presents today for the primary complaint of pain as moderately severe radiating into his left hip.  He is requesting an injection in the hip as well as some understanding of the allergy of his pain he wears a low back brace for back pain.     Review of Systems  Musculoskeletal: Positive for back pain, joint swelling and gait problem.       Objective:   Physical Exam  Nursing note and vitals reviewed. Constitutional: He appears well-developed and well-nourished.  HENT:  Head: Normocephalic and atraumatic.  Eyes: Conjunctivae are normal. Pupils are equal, round, and reactive to light.  Neck: Normal range of motion. Neck supple.  Cardiovascular: Normal rate and regular rhythm.   Pulmonary/Chest: Effort normal and breath sounds normal.  Abdominal: Soft. Bowel sounds are normal.  Musculoskeletal:       Patient has leg lift on the left and tenderness at the greater trochanter indicating both bursitis and probable radicular back pain  Skin: Skin is warm and dry.          Assessment & Plan:   Informed consent obtained and the patient's left hip was prepped with betadine. Local anesthesia was obtained with topical spray. Then 40 mg of Depo-Medrol and 1/2 cc of lidocaine was injected into the joint space. The patient tolerated the procedure without complications. Post injection care discussed with patient.   Again the patient back exercises for the back pain.  His blood pressure stable his current medications the effects of stroke are stable we discussed the back exercises and demonstrated them to the patient

## 2011-09-05 NOTE — Patient Instructions (Signed)
Back Exercises Back exercises help treat and prevent back injuries. The goal of back exercises is to increase the strength of your abdominal and back muscles and the flexibility of your back. These exercises should be started when you no longer have back pain. Back exercises include:  Pelvic Tilt. Lie on your back with your knees bent. Tilt your pelvis until the lower part of your back is against the floor. Hold this position 5 to 10 sec and repeat 5 to 10 times.   Knee to Chest. Pull first 1 knee up against your chest and hold for 20 to 30 seconds, repeat this with the other knee, and then both knees. This may be done with the other leg straight or bent, whichever feels better.   Sit-Ups or Curl-Ups. Bend your knees 90 degrees. Start with tilting your pelvis, and do a partial, slow sit-up, lifting your trunk only 30 to 45 degrees off the floor. Take at least 2 to 3 seconds for each sit-up. Do not do sit-ups with your knees out straight. If partial sit-ups are difficult, simply do the above but with only tightening your abdominal muscles and holding it as directed.   Hip-Lift. Lie on your back with your knees flexed 90 degrees. Push down with your feet and shoulders as you raise your hips a couple inches off the floor; hold for 10 seconds, repeat 5 to 10 times.   Back arches. Lie on your stomach, propping yourself up on bent elbows. Slowly press on your hands, causing an arch in your low back. Repeat 3 to 5 times. Any initial stiffness and discomfort should lessen with repetition over time.   Shoulder-Lifts. Lie face down with arms beside your body. Keep hips and torso pressed to floor as you slowly lift your head and shoulders off the floor.  Do not overdo your exercises, especially in the beginning. Exercises may cause you some mild back discomfort which lasts for a few minutes; however, if the pain is more severe, or lasts for more than 15 minutes, do not continue exercises until you see your  caregiver. Improvement with exercise therapy for back problems is slow.  See your caregivers for assistance with developing a proper back exercise program. Document Released: 03/23/2004 Document Revised: 02/02/2011 Document Reviewed: 02/13/2005 [facility] Patient Information 2012 [place].

## 2011-12-06 ENCOUNTER — Ambulatory Visit (INDEPENDENT_AMBULATORY_CARE_PROVIDER_SITE_OTHER): Payer: Medicare Other | Admitting: Internal Medicine

## 2011-12-06 ENCOUNTER — Encounter (INDEPENDENT_AMBULATORY_CARE_PROVIDER_SITE_OTHER): Payer: Medicare Other

## 2011-12-06 ENCOUNTER — Encounter: Payer: Self-pay | Admitting: Internal Medicine

## 2011-12-06 VITALS — BP 172/82 | Temp 98.0°F | Wt 198.0 lb

## 2011-12-06 DIAGNOSIS — M79604 Pain in right leg: Secondary | ICD-10-CM

## 2011-12-06 DIAGNOSIS — M79609 Pain in unspecified limb: Secondary | ICD-10-CM

## 2011-12-06 DIAGNOSIS — M7989 Other specified soft tissue disorders: Secondary | ICD-10-CM

## 2011-12-06 NOTE — Assessment & Plan Note (Signed)
78 year old white male with history of CVA with right hemiparesis and aphasia complains of right leg pain x2 weeks. Patient very poor historian due to aphasia. He is not accompanied by any of his family members. He has right ankle and calf swelling. His right foot is cool to touch and he has diminished pulses. Obtain arterial Dopplers to rule out ischemia. Also obtain venous Doppler to rule out DVT.

## 2011-12-06 NOTE — Progress Notes (Signed)
Subjective:    [name], male    DOB: [birth year], 78 y.o.   MRN: [id number]  HPI  78 year old white male with history of coronary artery disease and CVA with right hemiparesis complains of right leg pain for 2 weeks. Patient is very poor historian. 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  He also had difficulty writing down his symptoms. He is not accompanied by any family members.  We were unable to contact his family members during his office visit.  Review of Systems Unobtainable Past Medical History  Diagnosis Date  . GERD (gastroesophageal reflux disease)   . Hyperlipidemia   . Hypertension   . CAD (coronary artery disease)   . Stroke     History   Social History  . Marital Status: Married    Spouse Name: N/A    Number of Children: N/A  . Years of Education: N/A   Occupational History  . Not on file.   Social History Main Topics  . Smoking status: Former Research scientist (life sciences)  . Smokeless tobacco: Not on file  . Alcohol Use: No  . Drug Use: No  . Sexually Active: Not Currently   Other Topics Concern  . Not on file   Social History Narrative  . No narrative on file    Past Surgical History  Procedure Date  . Coronary artery bypass graft     x 5  . Tonsillectomy   . Band hemorrhoidectomy   . Dental implants   . Abdominal surgery   . Back surgery   . Hernia repair   . Kidney surgery     stenosis with stent  . Cholecystectomy     Family History  Problem Relation Age of Onset  . Heart attack      fhx  . Coronary artery disease      fhx  . Heart disease Father     Allergies  Allergen Reactions  . Ciprofloxacin Hcl   . Sulfamethoxazole W-Trimethoprim     Current Outpatient Prescriptions on File Prior to Visit  Medication Sig Dispense Refill  . aspirin 81 MG tablet Take 81 mg by mouth daily.        Marland Kitchen escitalopram (LEXAPRO) 20 MG tablet Take 1 tablet (20 mg total) by mouth daily.  90 tablet  1  . lansoprazole (PREVACID) 15 MG  capsule Take 15 mg by mouth daily.        . memantine (NAMENDA) 10 MG tablet Take 10 mg by mouth daily.      Marland Kitchen METRONIDAZOLE, TOPICAL, 0.75 % LOTN Apply topically daily.        . Multiple Vitamin (MULTIVITAMIN) tablet Take 1 tablet by mouth daily.        Marland Kitchen NAMENDA 10 MG tablet TAKE ONE (1) TABLET(S) BY MOUTH ONCE A DAY  30 each  11  . rosuvastatin (CRESTOR) 20 MG tablet Take 10 mg by mouth daily.      . sucralfate (CARAFATE) 1 G tablet Take 1 g by mouth daily.       . Tamsulosin HCl (FLOMAX) 0.4 MG CAPS Take 0.4 mg by mouth daily.      Marland Kitchen triamterene-hydrochlorothiazide (MAXZIDE-25) 37.5-25 MG per tablet Take 0.5 each (0.5 tablets total) by mouth every morning.      Marland Kitchen DISCONTD: triamterene-hydrochlorothiazide (MAXZIDE-25) 37.5-25 MG per tablet Take 1 each (1 tablet total) by mouth every morning.  30 tablet  11   Current Facility-Administered Medications on File Prior to Visit  Medication Dose  Route Frequency Provider Last Rate Last Dose  . methylPREDNISolone acetate (DEPO-MEDROL) injection 40 mg  40 mg Intra-articular Once Ricard Dillon, MD      . methylPREDNISolone acetate PF (DEPO-MEDROL) injection 40 mg  40 mg Intra-articular Once Ricard Dillon, MD        BP 172/82  Temp 98 F (36.7 C) (Oral)  Wt 198 lb (89.812 kg)       Objective:   Physical Exam  Constitutional: He appears well-developed and well-nourished.  HENT:  Head: Normocephalic and atraumatic.  Mouth/Throat: Oropharynx is clear and moist.  Cardiovascular: Normal rate and regular rhythm.   Pulmonary/Chest: Effort normal and breath sounds normal. He has no wheezes.  Musculoskeletal:       Right lower extremity edema. No significant redness. Foot was cool to touch. Decreased pedis dorsalis and posterior tibial pulses of right foot.  Neurological:       aphasia  Psychiatric: He has a normal mood and affect. His behavior is normal.          Assessment & Plan:

## 2011-12-07 ENCOUNTER — Encounter (INDEPENDENT_AMBULATORY_CARE_PROVIDER_SITE_OTHER): Payer: Medicare Other

## 2011-12-07 ENCOUNTER — Encounter: Payer: Medicare Other | Admitting: Internal Medicine

## 2011-12-07 ENCOUNTER — Ambulatory Visit: Payer: Medicare Other | Admitting: Internal Medicine

## 2011-12-07 DIAGNOSIS — I739 Peripheral vascular disease, unspecified: Secondary | ICD-10-CM

## 2011-12-11 ENCOUNTER — Ambulatory Visit: Payer: Medicare Other | Admitting: Internal Medicine

## 2012-01-30 ENCOUNTER — Telehealth: Payer: Self-pay | Admitting: Internal Medicine

## 2012-01-30 NOTE — Telephone Encounter (Signed)
Pt called and said that the pt has papers from dmv that he needs to get Dr Arnoldo Morale to complete in order to get license renewed. Pts spouse is going to drop forms off for Dr Arnoldo Morale to complete and sign. Pts spouse will pick up forms when ready.

## 2012-02-02 ENCOUNTER — Telehealth: Payer: Self-pay | Admitting: Internal Medicine

## 2012-02-02 NOTE — Telephone Encounter (Signed)
Pt would like to know if you have had chance to fill out DMV forms.  Pls call [phone]

## 2012-02-02 NOTE — Telephone Encounter (Signed)
Has been ready for several days but pt is not asnwering the ph one- called 2 minutes after they called today and still no answer-  Left message on machine For daughter to please let them know it is ready

## 2012-03-05 ENCOUNTER — Telehealth: Payer: Self-pay | Admitting: Internal Medicine

## 2012-03-05 NOTE — Telephone Encounter (Signed)
Pt's wife said pt says he needs to see MD. She is not sure what this is concerning. What provider would you recommend  to schedule this patient with?

## 2012-03-06 NOTE — Telephone Encounter (Signed)
Ov tomorrow Thursday 9:45

## 2012-03-07 ENCOUNTER — Ambulatory Visit (INDEPENDENT_AMBULATORY_CARE_PROVIDER_SITE_OTHER): Payer: Medicare Other | Admitting: Internal Medicine

## 2012-03-07 ENCOUNTER — Encounter: Payer: Self-pay | Admitting: Internal Medicine

## 2012-03-07 VITALS — BP 135/88 | HR 72 | Temp 98.1°F | Resp 16 | Ht 72.0 in | Wt 200.0 lb

## 2012-03-07 DIAGNOSIS — F39 Unspecified mood [affective] disorder: Secondary | ICD-10-CM

## 2012-03-07 DIAGNOSIS — I10 Essential (primary) hypertension: Secondary | ICD-10-CM

## 2012-03-07 MED ORDER — SERTRALINE HCL 25 MG PO TABS: 25.0000 mg | ORAL_TABLET | Freq: Every morning | ORAL | Status: DC

## 2012-03-07 NOTE — Progress Notes (Signed)
Subjective:    [name], male    DOB: [birth year], 79 y.o.   MRN: [id number]  HPI Mild depression Mood changes during the day Related to chronic condition   Review of Systems  Constitutional: Negative for fever and fatigue.  HENT: Negative for hearing loss, congestion, neck pain and postnasal drip.   Eyes: Negative for discharge, redness and visual disturbance.  Respiratory: Negative for cough, shortness of breath and wheezing.   Cardiovascular: Negative for leg swelling.  Gastrointestinal: Negative for abdominal pain, constipation and abdominal distention.  Genitourinary: Negative for urgency and frequency.  Musculoskeletal: Negative for joint swelling and arthralgias.  Skin: Negative for color change and rash.  Neurological: Negative for weakness and light-headedness.  Hematological: Negative for adenopathy.  Psychiatric/Behavioral: Positive for dysphoric mood and decreased concentration. Negative for behavioral problems.   Past Medical History  Diagnosis Date  . GERD (gastroesophageal reflux disease)   . Hyperlipidemia   . Hypertension   . CAD (coronary artery disease)   . Stroke     History   Social History  . Marital Status: Married    Spouse Name: N/A    Number of Children: N/A  . Years of Education: N/A   Occupational History  . Not on file.   Social History Main Topics  . Smoking status: Former Research scientist (life sciences)  . Smokeless tobacco: Not on file  . Alcohol Use: No  . Drug Use: No  . Sexually Active: Not Currently   Other Topics Concern  . Not on file   Social History Narrative  . No narrative on file    Past Surgical History  Procedure Date  . Coronary artery bypass graft     x 5  . Tonsillectomy   . Band hemorrhoidectomy   . Dental implants   . Abdominal surgery   . Back surgery   . Hernia repair   . Kidney surgery     stenosis with stent  . Cholecystectomy     Family History  Problem Relation Age of Onset  . Heart attack     fhx  . Coronary artery disease      fhx  . Heart disease Father     Allergies  Allergen Reactions  . Ciprofloxacin Hcl   . Sulfamethoxazole W-Trimethoprim     Current Outpatient Prescriptions on File Prior to Visit  Medication Sig Dispense Refill  . aspirin 81 MG tablet Take 81 mg by mouth daily.        Marland Kitchen escitalopram (LEXAPRO) 20 MG tablet Take 1 tablet (20 mg total) by mouth daily.  90 tablet  1  . lansoprazole (PREVACID) 15 MG capsule Take 15 mg by mouth daily.        Marland Kitchen METRONIDAZOLE, TOPICAL, 0.75 % LOTN Apply topically daily.        . Multiple Vitamin (MULTIVITAMIN) tablet Take 1 tablet by mouth daily.        Marland Kitchen NAMENDA 10 MG tablet TAKE ONE (1) TABLET(S) BY MOUTH ONCE A DAY  30 each  11  . rosuvastatin (CRESTOR) 20 MG tablet Take 10 mg by mouth daily.      . sucralfate (CARAFATE) 1 G tablet Take 1 g by mouth daily.         BP 150/90  Pulse 72  Temp 98.1 F (36.7 C)  Resp 16  Ht 6' (1.829 m)  Wt 200 lb (90.719 kg)  BMI 27.12 kg/m2       Objective:   Physical Exam  Constitutional: He appears well-developed and well-nourished.  HENT:  Head: Normocephalic and atraumatic.  Eyes: Conjunctivae normal are normal. Pupils are equal, round, and reactive to light.  Neck: Normal range of motion. Neck supple.  Cardiovascular: Normal rate and regular rhythm.   Pulmonary/Chest: Effort normal and breath sounds normal.  Abdominal: Soft. Bowel sounds are normal.  Neurological: He displays abnormal reflex. Coordination abnormal.          Assessment & Plan:  Resume the lexapro for depression Stable blood pressure Continue home speech therapy for dysarthria

## 2012-03-07 NOTE — Patient Instructions (Addendum)
Make sure that he is taking lexapro or escitalopram If he is not I will call in celexa 40

## 2012-03-08 ENCOUNTER — Other Ambulatory Visit: Payer: Self-pay | Admitting: *Deleted

## 2012-03-08 MED ORDER — ROSUVASTATIN CALCIUM 20 MG PO TABS: 10.0000 mg | ORAL_TABLET | Freq: Every day | ORAL | Status: DC

## 2012-06-07 ENCOUNTER — Encounter: Payer: Self-pay | Admitting: Internal Medicine

## 2012-06-07 ENCOUNTER — Ambulatory Visit (INDEPENDENT_AMBULATORY_CARE_PROVIDER_SITE_OTHER): Payer: Medicare Other | Admitting: Internal Medicine

## 2012-06-07 VITALS — BP 140/70 | HR 56 | Temp 98.0°F | Resp 16 | Ht <= 58 in | Wt 200.0 lb

## 2012-06-07 DIAGNOSIS — R001 Bradycardia, unspecified: Secondary | ICD-10-CM

## 2012-06-07 DIAGNOSIS — K219 Gastro-esophageal reflux disease without esophagitis: Secondary | ICD-10-CM

## 2012-06-07 DIAGNOSIS — I498 Other specified cardiac arrhythmias: Secondary | ICD-10-CM

## 2012-06-07 DIAGNOSIS — T887XXA Unspecified adverse effect of drug or medicament, initial encounter: Secondary | ICD-10-CM

## 2012-06-07 DIAGNOSIS — I10 Essential (primary) hypertension: Secondary | ICD-10-CM

## 2012-06-07 DIAGNOSIS — E785 Hyperlipidemia, unspecified: Secondary | ICD-10-CM

## 2012-06-07 LAB — LIPID PANEL
LDL Cholesterol: 56 mg/dL (ref 0–99)
Total CHOL/HDL Ratio: 2
VLDL: 15.8 mg/dL (ref 0.0–40.0)

## 2012-06-07 LAB — CBC WITH DIFFERENTIAL/PLATELET
Basophils Relative: 0.5 % (ref 0.0–3.0)
Eosinophils Relative: 2 % (ref 0.0–5.0)
HCT: 39.2 % (ref 39.0–52.0)
Hemoglobin: 13.1 g/dL (ref 13.0–17.0)
Lymphs Abs: 1.4 10*3/uL (ref 0.7–4.0)
MCV: 94.8 fl (ref 78.0–100.0)
Monocytes Absolute: 0.8 10*3/uL (ref 0.1–1.0)
Monocytes Relative: 14.1 % — ABNORMAL HIGH (ref 3.0–12.0)
RBC: 4.14 Mil/uL — ABNORMAL LOW (ref 4.22–5.81)
WBC: 5.4 10*3/uL (ref 4.5–10.5)

## 2012-06-07 LAB — HEPATIC FUNCTION PANEL
Albumin: 4.2 g/dL (ref 3.5–5.2)
Alkaline Phosphatase: 81 U/L (ref 39–117)
Bilirubin, Direct: 0.1 mg/dL (ref 0.0–0.3)
Total Protein: 7 g/dL (ref 6.0–8.3)

## 2012-06-07 NOTE — Progress Notes (Signed)
Subjective:    [name], male    DOB: [birth year], 79 y.o.   MRN: [id number]  HPI  Patient is an 79 year old male with expressive dysphasia following severe stroke history of atrial fibrillation history of gastroesophageal reflux and a history of hypertension who presents for followup today he has a spot on his back that he is concerned about that itches and has grown in size.  He denies any chest pain or shortness of breath he has questions about his medication including remaining on the antidepressant as well as he brings in his diastolic which was discontinued due to his bradycardia  Review of Systems  Skin: Positive for pallor and rash.  Neurological: Positive for weakness.       Expressive aphasia   Past Medical History  Diagnosis Date  . GERD (gastroesophageal reflux disease)   . Hyperlipidemia   . Hypertension   . CAD (coronary artery disease)   . Stroke     History   Social History  . Marital Status: Married    Spouse Name: N/A    Number of Children: N/A  . Years of Education: N/A   Occupational History  . Not on file.   Social History Main Topics  . Smoking status: Former Research scientist (life sciences)  . Smokeless tobacco: Not on file  . Alcohol Use: No  . Drug Use: No  . Sexually Active: Not Currently   Other Topics Concern  . Not on file   Social History Narrative  . No narrative on file    Past Surgical History  Procedure Laterality Date  . Coronary artery bypass graft      x 5  . Tonsillectomy    . Band hemorrhoidectomy    . Dental implants    . Abdominal surgery    . Back surgery    . Hernia repair    . Kidney surgery      stenosis with stent  . Cholecystectomy      Family History  Problem Relation Age of Onset  . Heart attack      fhx  . Coronary artery disease      fhx  . Heart disease Father     Allergies  Allergen Reactions  . Ciprofloxacin Hcl   . Sulfamethoxazole W-Trimethoprim     Current Outpatient Prescriptions on File  Prior to Visit  Medication Sig Dispense Refill  . aspirin 81 MG tablet Take 81 mg by mouth daily.        Marland Kitchen escitalopram (LEXAPRO) 20 MG tablet Take 1 tablet (20 mg total) by mouth daily.  90 tablet  1  . lansoprazole (PREVACID) 15 MG capsule Take 15 mg by mouth daily.        Marland Kitchen METRONIDAZOLE, TOPICAL, 0.75 % LOTN Apply topically daily.        . Multiple Vitamin (MULTIVITAMIN) tablet Take 1 tablet by mouth daily.        Marland Kitchen NAMENDA 10 MG tablet TAKE ONE (1) TABLET(S) BY MOUTH ONCE A DAY  30 each  11  . rosuvastatin (CRESTOR) 20 MG tablet Take 0.5 tablets (10 mg total) by mouth daily.  30 tablet  11  . sucralfate (CARAFATE) 1 G tablet Take 1 g by mouth daily.        No current facility-administered medications on file prior to visit.    BP 140/70  Pulse 56  Temp(Src) 98 F (36.7 C)  Resp 16  Ht 4' (1.219 m)  Wt 200 lb (90.719 kg)  BMI 61.05 kg/m2       Objective:   Physical Exam  Constitutional: He appears well-developed and well-nourished.  HENT:  Head: Normocephalic and atraumatic.  Eyes: Conjunctivae are normal. Pupils are equal, round, and reactive to light.  Neck: Normal range of motion. Neck supple.  Cardiovascular:  Irregular rate but is bradycardic  Pulmonary/Chest: Effort normal and breath sounds normal.  Abdominal: Soft. Bowel sounds are normal.          Assessment & Plan:  Blood pressure stable off of diastolic he titrated down from 1 tablet to half tablet been off Due to his bradycardia I would keep him on this medication her blood pressure 140/70 he is stable atrial fibrillation is stable with a bradycardic rate The spot on  back is a seborrheic keratosis His gastroesophageal reflux is stable his current medications his blood pressure stable

## 2012-06-07 NOTE — Patient Instructions (Addendum)
Stop the bystollic  The spot on the back is a "keratosis" and is benign

## 2012-06-24 ENCOUNTER — Other Ambulatory Visit: Payer: Self-pay | Admitting: Internal Medicine

## 2012-07-08 ENCOUNTER — Other Ambulatory Visit: Payer: Self-pay | Admitting: *Deleted

## 2012-07-08 MED ORDER — NEBIVOLOL HCL 2.5 MG PO TABS: 2.5000 mg | ORAL_TABLET | Freq: Every day | ORAL | Status: DC

## 2012-07-10 ENCOUNTER — Ambulatory Visit (INDEPENDENT_AMBULATORY_CARE_PROVIDER_SITE_OTHER): Payer: Medicare Other | Admitting: Family

## 2012-07-10 ENCOUNTER — Encounter: Payer: Self-pay | Admitting: Family

## 2012-07-10 ENCOUNTER — Telehealth: Payer: Self-pay | Admitting: Internal Medicine

## 2012-07-10 VITALS — BP 154/90 | HR 62 | Wt 200.0 lb

## 2012-07-10 DIAGNOSIS — R609 Edema, unspecified: Secondary | ICD-10-CM

## 2012-07-10 DIAGNOSIS — I25810 Atherosclerosis of coronary artery bypass graft(s) without angina pectoris: Secondary | ICD-10-CM

## 2012-07-10 DIAGNOSIS — R600 Localized edema: Secondary | ICD-10-CM

## 2012-07-10 LAB — BASIC METABOLIC PANEL
BUN: 22 mg/dL (ref 6–23)
Chloride: 98 mEq/L (ref 96–112)
GFR: 58.28 mL/min — ABNORMAL LOW (ref >60.00)
Glucose, Bld: 135 mg/dL — ABNORMAL HIGH (ref 70–99)
Potassium: 4.2 mEq/L (ref 3.5–5.1)
Sodium: 136 mEq/L (ref 135–145)

## 2012-07-10 LAB — POCT URINALYSIS DIPSTICK
Bilirubin, UA: NEGATIVE
Glucose, UA: NEGATIVE
Ketones, UA: NEGATIVE
Leukocytes, UA: NEGATIVE
Nitrite, UA: NEGATIVE
pH, UA: 6.5

## 2012-07-10 MED ORDER — FUROSEMIDE 20 MG PO TABS: 20.0000 mg | ORAL_TABLET | Freq: Every day | ORAL | Status: DC

## 2012-07-10 NOTE — Patient Instructions (Signed)

## 2012-07-10 NOTE — Telephone Encounter (Signed)
Patient Information:  Caller Name: [name]  Phone: [phone]  Patient: [name], [name]  Gender: Male  DOB: 12-19-1928  Age: 79 Years  PCP: Benay Pillow (Adults only)  Office Follow Up:  Does the office need to follow up with this patient?: No  Instructions For The Office: N/A   Symptoms  Reason For Call & Symptoms: Wife states her husbands right ankle and foot are swollen .  Onset two days.  Denies any fall or injury.  She states he was in office in October 2013 for the same thing with No diagnosis. . Denies pain and is able to ambulate. No discoloration.   Reviewed Health History In EMR: Yes  Reviewed Medications In EMR: Yes  Reviewed Allergies In EMR: Yes  Reviewed Surgeries / Procedures: Yes  Date of Onset of Symptoms: 07/08/2012  Treatments Tried: Elevation, decrease salt  Treatments Tried Worked: No  Guideline(s) Used:  Foot Pain  Disposition Per Guideline:   See Today in Office  Reason For Disposition Reached:   Swollen foot (EXCEPTIONs: localized bump from bunions, calluses, insect bite, sting)  Advice Given:  Reassurance - Foot Pain  : The symptoms you describe do not sound serious. You have told me that there is no redness, swelling, or fever. You have also told me that there has been no recent major injury.  Aggravating Factors   Aging: Foot pain is more common in the elderly. During the aging process, the feet widen and flatten, and the skin becomes drier.  Shoes: Poorly fitting or overly tight shoes are the underlying cause of many painful foot conditions. High heels are bad for feet.  Reassurance - Overuse  It sounds like an overuse injury.  Foot pain and soreness are very common following vigorous activity. Such activities include sports like tennis and basketball, jogging, and certain types of work (lots of walking).  Call Back If:  Swelling, redness, or fever occur  You become worse.  Patient Will Follow Care Advice:  YES  Appointment Scheduled:  07/10/2012 15:30:00 Appointment Scheduled Provider:  [facility])

## 2012-07-11 NOTE — Progress Notes (Signed)
Subjective:    [name], male    DOB: Jul 15, 1928, 79 y.o.   MRN: [id number]  HPI 79 year old white male, nonsmoker, is in today with c/o right lower leg swelling x 2 days. He is a poor historian due to aphasia due to a prior CVA. He has had vascular surgery to that extremity in the past to remove a vein. Denies any injury. His wife reports that he consumes a large amount of sodium and adds additional salt prior to eating.  He had a similar issue in October 2013 and Dr. Shawna Orleans ordered an ultrasound that was negative.    Review of Systems  Constitutional: Negative.   Cardiovascular: Positive for leg swelling. Negative for chest pain and palpitations.       Right foot and leg swelling  Gastrointestinal: Negative.   Endocrine: Negative.   Genitourinary: Negative.   Musculoskeletal: Negative.   Allergic/Immunologic: Negative.   Psychiatric/Behavioral: Negative.    Past Medical History  Diagnosis Date  . GERD (gastroesophageal reflux disease)   . Hyperlipidemia   . Hypertension   . CAD (coronary artery disease)   . Stroke     History   Social History  . Marital Status: Married    Spouse Name: N/A    Number of Children: N/A  . Years of Education: N/A   Occupational History  . Not on file.   Social History Main Topics  . Smoking status: Former Research scientist (life sciences)  . Smokeless tobacco: Not on file  . Alcohol Use: No  . Drug Use: No  . Sexually Active: Not Currently   Other Topics Concern  . Not on file   Social History Narrative  . No narrative on file    Past Surgical History  Procedure Laterality Date  . Coronary artery bypass graft      x 5  . Tonsillectomy    . Band hemorrhoidectomy    . Dental implants    . Abdominal surgery    . Back surgery    . Hernia repair    . Kidney surgery      stenosis with stent  . Cholecystectomy      Family History  Problem Relation Age of Onset  . Heart attack      fhx  . Coronary artery disease      fhx  . Heart  disease Father     Allergies  Allergen Reactions  . Ciprofloxacin Hcl   . Sulfamethoxazole W-Trimethoprim     Current Outpatient Prescriptions on File Prior to Visit  Medication Sig Dispense Refill  . aspirin 81 MG tablet Take 81 mg by mouth daily.        Marland Kitchen escitalopram (LEXAPRO) 20 MG tablet Take 1 tablet (20 mg total) by mouth daily.  90 tablet  1  . lansoprazole (PREVACID) 15 MG capsule Take 15 mg by mouth daily.        Marland Kitchen METRONIDAZOLE, TOPICAL, 0.75 % LOTN Apply topically daily.        . Multiple Vitamin (MULTIVITAMIN) tablet Take 1 tablet by mouth daily.        Marland Kitchen NAMENDA 10 MG tablet TAKE ONE (1) TABLET(S) BY MOUTH ONCE A DAY  30 each  11  . nebivolol (BYSTOLIC) 2.5 MG tablet Take 1 tablet (2.5 mg total) by mouth daily.  30 tablet  3  . rosuvastatin (CRESTOR) 20 MG tablet Take 0.5 tablets (10 mg total) by mouth daily.  30 tablet  11  . sucralfate (  CARAFATE) 1 G tablet Take 1 g by mouth daily.       Marland Kitchen triamterene-hydrochlorothiazide (MAXZIDE-25) 37.5-25 MG per tablet TAKE ONE TABLET BY MOUTH IN THE MORNING  30 tablet  0   No current facility-administered medications on file prior to visit.    BP 154/90  Pulse 62  Wt 200 lb (90.719 kg)  BMI 61.05 kg/m2  SpO2 98%chart    Objective:   Physical Exam  Constitutional: He is oriented to person, place, and time. He appears well-developed and well-nourished.  Neck: Normal range of motion. Neck supple. No thyromegaly present.  Cardiovascular: Normal rate, regular rhythm and normal heart sounds.   Pulmonary/Chest: Effort normal and breath sounds normal.  Musculoskeletal: He exhibits edema. He exhibits no tenderness.  Right leg 2+ peripheral edema to the right lower extermity. No redness. No pain.  Neurological: He is alert and oriented to person, place, and time.  Skin: Skin is warm and dry.  Psychiatric: He has a normal mood and affect.          Assessment & Plan:  Assessment:  1. Peripheral Edema 2. Aphasia 3.  Hypertension  Plan: Labs sent to rule out any underlying renal disease, electrolyte imbalance, or cardiac concerns. I strongly believe his symptoms are related to sodium and water retention. Low sodium diet. Lasix 20mg  once a day x 1 week. Elevate legs. Consider compression hose.

## 2012-07-19 ENCOUNTER — Other Ambulatory Visit: Payer: Self-pay | Admitting: *Deleted

## 2012-07-19 MED ORDER — NEBIVOLOL HCL 2.5 MG PO TABS: 2.5000 mg | ORAL_TABLET | Freq: Every day | ORAL | Status: DC

## 2012-08-07 ENCOUNTER — Other Ambulatory Visit: Payer: Self-pay | Admitting: Internal Medicine

## 2012-08-24 ENCOUNTER — Other Ambulatory Visit: Payer: Self-pay | Admitting: Internal Medicine

## 2012-09-02 ENCOUNTER — Ambulatory Visit (INDEPENDENT_AMBULATORY_CARE_PROVIDER_SITE_OTHER): Payer: Medicare Other | Admitting: Family

## 2012-09-02 ENCOUNTER — Encounter: Payer: Self-pay | Admitting: Family

## 2012-09-02 VITALS — BP 118/60 | HR 67 | Temp 98.2°F | Wt 194.0 lb

## 2012-09-02 DIAGNOSIS — J209 Acute bronchitis, unspecified: Secondary | ICD-10-CM

## 2012-09-02 DIAGNOSIS — R05 Cough: Secondary | ICD-10-CM

## 2012-09-02 DIAGNOSIS — R059 Cough, unspecified: Secondary | ICD-10-CM

## 2012-09-02 MED ORDER — HYDROCOD POLST-CHLORPHEN POLST 10-8 MG/5ML PO LQCR: 5.0000 mL | Freq: Two times a day (BID) | ORAL | Status: DC | PRN

## 2012-09-02 MED ORDER — CEFUROXIME AXETIL 250 MG PO TABS: 250.0000 mg | ORAL_TABLET | Freq: Two times a day (BID) | ORAL | Status: DC

## 2012-09-02 NOTE — Progress Notes (Signed)
Subjective:    [name], male    DOB: 09-17-1928, 79 y.o.   MRN: [id number]  HPI 79 year old white male, nonsmoker, patient of Dr. Arnoldo Morale is in today with complaints of sneezing, cough, congestion that began last week. His symptoms appear to be clearing and resume yesterday. His intake and over-the-counter Mucinex and a cough syrup with no relief. Reports exposure to illness from his grandchildren.   Review of Systems  Constitutional: Negative.   HENT: Positive for congestion. Negative for sneezing and sinus pressure.   Respiratory: Positive for cough. Negative for shortness of breath and wheezing.   Gastrointestinal: Negative.   Musculoskeletal: Negative.   Skin: Negative.   Allergic/Immunologic: Negative.   Neurological: Negative.   Psychiatric/Behavioral: Negative.    Past Medical History  Diagnosis Date  . GERD (gastroesophageal reflux disease)   . Hyperlipidemia   . Hypertension   . CAD (coronary artery disease)   . Stroke     History   Social History  . Marital Status: Married    Spouse Name: N/A    Number of Children: N/A  . Years of Education: N/A   Occupational History  . Not on file.   Social History Main Topics  . Smoking status: Former Research scientist (life sciences)  . Smokeless tobacco: Not on file  . Alcohol Use: No  . Drug Use: No  . Sexually Active: Not Currently   Other Topics Concern  . Not on file   Social History Narrative  . No narrative on file    Past Surgical History  Procedure Laterality Date  . Coronary artery bypass graft      x 5  . Tonsillectomy    . Band hemorrhoidectomy    . Dental implants    . Abdominal surgery    . Back surgery    . Hernia repair    . Kidney surgery      stenosis with stent  . Cholecystectomy      Family History  Problem Relation Age of Onset  . Heart attack      fhx  . Coronary artery disease      fhx  . Heart disease Father     Allergies  Allergen Reactions  . Ciprofloxacin Hcl   .  Sulfamethoxazole W-Trimethoprim     Current Outpatient Prescriptions on File Prior to Visit  Medication Sig Dispense Refill  . aspirin 81 MG tablet Take 81 mg by mouth daily.        Marland Kitchen escitalopram (LEXAPRO) 20 MG tablet Take 1 tablet (20 mg total) by mouth daily.  90 tablet  1  . furosemide (LASIX) 20 MG tablet Take 1 tablet (20 mg total) by mouth daily.  14 tablet  0  . lansoprazole (PREVACID) 15 MG capsule Take 15 mg by mouth daily.        Marland Kitchen METRONIDAZOLE, TOPICAL, 0.75 % LOTN Apply topically daily.        . Multiple Vitamin (MULTIVITAMIN) tablet Take 1 tablet by mouth daily.        Marland Kitchen NAMENDA 10 MG tablet TAKE ONE TABLET BY MOUTH ONE TIME DAILY  30 tablet  11  . nebivolol (BYSTOLIC) 2.5 MG tablet Take 1 tablet (2.5 mg total) by mouth daily.  90 tablet  3  . rosuvastatin (CRESTOR) 20 MG tablet Take 0.5 tablets (10 mg total) by mouth daily.  30 tablet  11  . sucralfate (CARAFATE) 1 G tablet Take 1 g by mouth daily.       Marland Kitchen  triamterene-hydrochlorothiazide (MAXZIDE-25) 37.5-25 MG per tablet TAKE 1 TABLET BY MOUTH EVERY MORNING  30 tablet  11   No current facility-administered medications on file prior to visit.    BP 118/60  Pulse 67  Temp(Src) 98.2 F (36.8 C) (Oral)  Wt 194 lb (87.998 kg)  BMI 59.22 kg/m2  SpO2 98%chart    Objective:   Physical Exam  Constitutional: He is oriented to person, place, and time. He appears well-developed.  HENT:  Right Ear: External ear normal.  Left Ear: External ear normal.  Nose: Nose normal.  Mouth/Throat: Oropharynx is clear and moist.  Neck: Normal range of motion. Neck supple.  Cardiovascular: Normal rate, regular rhythm and normal heart sounds.   Pulmonary/Chest: Effort normal and breath sounds normal.  Musculoskeletal: Normal range of motion.  Neurological: He is alert and oriented to person, place, and time.  Skin: Skin is warm and dry.  Psychiatric: He has a normal mood and affect.          Assessment & Plan:  Assessment: 1.  Upper respiratory infection 2. Acute bronchitis  Plan: Ceftin 250 mg one tablet twice a day x7 days. Tussionex 1 teaspoon twice a day as needed for cough. Patient call the office if symptoms worsen or persist. Recheck a schedule, and as needed.

## 2012-09-02 NOTE — Patient Instructions (Addendum)

## 2012-10-07 ENCOUNTER — Encounter: Payer: Self-pay | Admitting: Internal Medicine

## 2012-10-07 ENCOUNTER — Ambulatory Visit (INDEPENDENT_AMBULATORY_CARE_PROVIDER_SITE_OTHER): Payer: Medicare Other | Admitting: Internal Medicine

## 2012-10-07 VITALS — BP 116/70 | HR 72 | Temp 98.6°F | Resp 16 | Ht 72.0 in | Wt 194.0 lb

## 2012-10-07 DIAGNOSIS — R7309 Other abnormal glucose: Secondary | ICD-10-CM

## 2012-10-07 DIAGNOSIS — I10 Essential (primary) hypertension: Secondary | ICD-10-CM

## 2012-10-07 DIAGNOSIS — R7303 Prediabetes: Secondary | ICD-10-CM

## 2012-10-07 LAB — LDL CHOLESTEROL, DIRECT: Direct LDL: 51.5 mg/dL

## 2012-10-07 LAB — HEMOGLOBIN A1C: Hgb A1c MFr Bld: 6.2 % (ref 4.6–6.5)

## 2012-10-07 NOTE — Progress Notes (Signed)
Subjective:    [name], male    DOB: 01-03-1929, 79 y.o.   MRN: [id number]  HPI Patient is a 4-year-old male followed for hypertension gastroesophageal reflux status post stroke with expressive aphasia.  Blood pressure stable recent blood work showed stable renal function slightly elevated blood glucose on a nonfasting specimen.  prediabetes    Review of Systems  Constitutional: Positive for fatigue. Negative for fever.  HENT: Negative for hearing loss, congestion, neck pain and postnasal drip.   Eyes: Negative for discharge, redness and visual disturbance.  Respiratory: Negative for cough, shortness of breath and wheezing.   Cardiovascular: Negative for leg swelling.  Gastrointestinal: Negative for abdominal pain, constipation and abdominal distention.  Genitourinary: Negative for urgency and frequency.  Musculoskeletal: Negative for joint swelling and arthralgias.  Skin: Negative for color change and rash.  Neurological: Positive for speech difficulty. Negative for weakness and light-headedness.  Hematological: Negative for adenopathy.  Psychiatric/Behavioral: Negative for behavioral problems.   Past Medical History  Diagnosis Date  . GERD (gastroesophageal reflux disease)   . Hyperlipidemia   . Hypertension   . CAD (coronary artery disease)   . Stroke     History   Social History  . Marital Status: Married    Spouse Name: N/A    Number of Children: N/A  . Years of Education: N/A   Occupational History  . Not on file.   Social History Main Topics  . Smoking status: Former Research scientist (life sciences)  . Smokeless tobacco: Not on file  . Alcohol Use: No  . Drug Use: No  . Sexually Active: Not Currently   Other Topics Concern  . Not on file   Social History Narrative  . No narrative on file    Past Surgical History  Procedure Laterality Date  . Coronary artery bypass graft      x 5  . Tonsillectomy    . Band hemorrhoidectomy    . Dental implants    .  Abdominal surgery    . Back surgery    . Hernia repair    . Kidney surgery      stenosis with stent  . Cholecystectomy      Family History  Problem Relation Age of Onset  . Heart attack      fhx  . Coronary artery disease      fhx  . Heart disease Father     Allergies  Allergen Reactions  . Ciprofloxacin Hcl   . Sulfamethoxazole W-Trimethoprim     Current Outpatient Prescriptions on File Prior to Visit  Medication Sig Dispense Refill  . aspirin 81 MG tablet Take 81 mg by mouth daily.        Marland Kitchen escitalopram (LEXAPRO) 20 MG tablet Take 1 tablet (20 mg total) by mouth daily.  90 tablet  1  . furosemide (LASIX) 20 MG tablet Take 1 tablet (20 mg total) by mouth daily.  14 tablet  0  . lansoprazole (PREVACID) 15 MG capsule Take 15 mg by mouth daily.        Marland Kitchen METRONIDAZOLE, TOPICAL, 0.75 % LOTN Apply topically daily.        . Multiple Vitamin (MULTIVITAMIN) tablet Take 1 tablet by mouth daily.        Marland Kitchen NAMENDA 10 MG tablet TAKE ONE TABLET BY MOUTH ONE TIME DAILY  30 tablet  11  . nebivolol (BYSTOLIC) 2.5 MG tablet Take 1 tablet (2.5 mg total) by mouth daily.  90 tablet  3  .  rosuvastatin (CRESTOR) 20 MG tablet Take 0.5 tablets (10 mg total) by mouth daily.  30 tablet  11  . sucralfate (CARAFATE) 1 G tablet Take 1 g by mouth daily.       Marland Kitchen triamterene-hydrochlorothiazide (MAXZIDE-25) 37.5-25 MG per tablet TAKE 1 TABLET BY MOUTH EVERY MORNING  30 tablet  11   No current facility-administered medications on file prior to visit.    BP 116/70  Pulse 72  Temp(Src) 98.6 F (37 C)  Resp 16  Ht 6' (1.829 m)  Wt 194 lb (87.998 kg)  BMI 26.31 kg/m2       Objective:   Physical Exam  Constitutional: He appears well-developed and well-nourished.  HENT:  Head: Normocephalic and atraumatic.  Eyes: Conjunctivae are normal. Pupils are equal, round, and reactive to light.  Neck: Normal range of motion. Neck supple.  Cardiovascular: Normal rate and regular rhythm.   Murmur  heard. Pulmonary/Chest: Effort normal and breath sounds normal.  Abdominal: Soft. Bowel sounds are normal.          Assessment & Plan:  Monitor hemoglobin A1c and direct LDL today Stable hypertension stable GERD stable expressive aphasia without signs of any progressive CVA

## 2012-10-07 NOTE — Patient Instructions (Signed)
The patient is instructed to continue all medications as prescribed. Schedule followup with check out clerk upon leaving the clinic  

## 2013-02-12 ENCOUNTER — Encounter: Payer: Self-pay | Admitting: Internal Medicine

## 2013-02-12 ENCOUNTER — Ambulatory Visit (INDEPENDENT_AMBULATORY_CARE_PROVIDER_SITE_OTHER): Payer: Medicare Other | Admitting: Internal Medicine

## 2013-02-12 VITALS — BP 140/70 | HR 58 | Temp 98.1°F | Resp 16 | Ht 72.0 in | Wt 198.0 lb

## 2013-02-12 DIAGNOSIS — E785 Hyperlipidemia, unspecified: Secondary | ICD-10-CM

## 2013-02-12 DIAGNOSIS — I10 Essential (primary) hypertension: Secondary | ICD-10-CM

## 2013-02-12 DIAGNOSIS — I69959 Hemiplegia and hemiparesis following unspecified cerebrovascular disease affecting unspecified side: Secondary | ICD-10-CM

## 2013-02-12 NOTE — Progress Notes (Signed)
   Subjective:    [name], male    DOB: 1928/10/06, 79 y.o.   MRN: [id number]  HPI  Processing disorder fro speech    Review of Systems     Objective:   Physical Exam        Assessment & Plan:

## 2013-02-12 NOTE — Progress Notes (Signed)
Pre visit review using our clinic review tool, if applicable. No additional management support is needed unless otherwise documented below in the visit note. 

## 2013-07-03 ENCOUNTER — Other Ambulatory Visit: Payer: Self-pay | Admitting: Internal Medicine

## 2013-08-11 ENCOUNTER — Other Ambulatory Visit (INDEPENDENT_AMBULATORY_CARE_PROVIDER_SITE_OTHER): Payer: Medicare Other

## 2013-08-11 DIAGNOSIS — E785 Hyperlipidemia, unspecified: Secondary | ICD-10-CM

## 2013-08-11 DIAGNOSIS — N400 Enlarged prostate without lower urinary tract symptoms: Secondary | ICD-10-CM

## 2013-08-11 DIAGNOSIS — I10 Essential (primary) hypertension: Secondary | ICD-10-CM

## 2013-08-11 LAB — CBC WITH DIFFERENTIAL/PLATELET
Basophils Absolute: 0 10*3/uL (ref 0.0–0.1)
Basophils Relative: 0.4 % (ref 0.0–3.0)
EOS PCT: 1.8 % (ref 0.0–5.0)
Eosinophils Absolute: 0.1 10*3/uL (ref 0.0–0.7)
HEMATOCRIT: 39.9 % (ref 39.0–52.0)
HEMOGLOBIN: 13.7 g/dL (ref 13.0–17.0)
LYMPHS ABS: 1.2 10*3/uL (ref 0.7–4.0)
Lymphocytes Relative: 20.1 % (ref 12.0–46.0)
MCHC: 34.2 g/dL (ref 30.0–36.0)
MCV: 94.8 fl (ref 78.0–100.0)
MONOS PCT: 13.2 % — AB (ref 3.0–12.0)
Monocytes Absolute: 0.8 10*3/uL (ref 0.1–1.0)
NEUTROS ABS: 3.9 10*3/uL (ref 1.4–7.7)
Neutrophils Relative %: 64.5 % (ref 43.0–77.0)
Platelets: 131 10*3/uL — ABNORMAL LOW (ref 150.0–400.0)
RBC: 4.21 Mil/uL — ABNORMAL LOW (ref 4.22–5.81)
RDW: 13.9 % (ref 11.5–15.5)
WBC: 6.1 10*3/uL (ref 4.0–10.5)

## 2013-08-11 LAB — HEPATIC FUNCTION PANEL
ALBUMIN: 4.3 g/dL (ref 3.5–5.2)
ALT: 25 U/L (ref 0–53)
AST: 26 U/L (ref 0–37)
Alkaline Phosphatase: 73 U/L (ref 39–117)
Bilirubin, Direct: 0.2 mg/dL (ref 0.0–0.3)
Total Bilirubin: 0.9 mg/dL (ref 0.2–1.2)
Total Protein: 6.8 g/dL (ref 6.0–8.3)

## 2013-08-11 LAB — POCT URINALYSIS DIPSTICK
BILIRUBIN UA: NEGATIVE
Blood, UA: NEGATIVE
GLUCOSE UA: NEGATIVE
KETONES UA: NEGATIVE
LEUKOCYTES UA: NEGATIVE
Nitrite, UA: NEGATIVE
PH UA: 7
Spec Grav, UA: 1.015
Urobilinogen, UA: 0.2

## 2013-08-11 LAB — LIPID PANEL
CHOL/HDL RATIO: 2
Cholesterol: 158 mg/dL (ref 0–200)
HDL: 75.2 mg/dL (ref >39.00)
LDL Cholesterol: 72 mg/dL (ref 0–99)
NONHDL: 82.8
Triglycerides: 52 mg/dL (ref 0.0–149.0)
VLDL: 10.4 mg/dL (ref 0.0–40.0)

## 2013-08-11 LAB — BASIC METABOLIC PANEL
BUN: 21 mg/dL (ref 6–23)
CALCIUM: 9.4 mg/dL (ref 8.4–10.5)
CO2: 31 meq/L (ref 19–32)
CREATININE: 1.3 mg/dL (ref 0.4–1.5)
Chloride: 94 mEq/L — ABNORMAL LOW (ref 96–112)
GFR: 57.59 mL/min — ABNORMAL LOW (ref >60.00)
GLUCOSE: 113 mg/dL — AB (ref 70–99)
Potassium: 4.9 mEq/L (ref 3.5–5.1)
SODIUM: 133 meq/L — AB (ref 135–145)

## 2013-08-11 LAB — PSA: PSA: 0.47 ng/mL (ref 0.10–4.00)

## 2013-08-11 LAB — TSH: TSH: 2.53 u[IU]/mL (ref 0.35–4.50)

## 2013-08-18 ENCOUNTER — Other Ambulatory Visit: Payer: Medicare Other

## 2013-08-23 ENCOUNTER — Other Ambulatory Visit: Payer: Self-pay | Admitting: Internal Medicine

## 2013-08-25 ENCOUNTER — Encounter: Payer: Medicare Other | Admitting: Internal Medicine

## 2013-08-27 ENCOUNTER — Ambulatory Visit (INDEPENDENT_AMBULATORY_CARE_PROVIDER_SITE_OTHER): Payer: Medicare Other | Admitting: Physician Assistant

## 2013-08-27 ENCOUNTER — Encounter: Payer: Self-pay | Admitting: Physician Assistant

## 2013-08-27 VITALS — BP 132/70 | HR 71 | Temp 98.1°F | Resp 18 | Ht 72.0 in | Wt 200.0 lb

## 2013-08-27 DIAGNOSIS — I480 Paroxysmal atrial fibrillation: Secondary | ICD-10-CM

## 2013-08-27 DIAGNOSIS — I4891 Unspecified atrial fibrillation: Secondary | ICD-10-CM

## 2013-08-27 DIAGNOSIS — Z0000 Encounter for general adult medical examination without abnormal findings: Secondary | ICD-10-CM

## 2013-08-27 NOTE — Progress Notes (Signed)
Subjective:    [name], male    DOB: 1928-08-06, 80 y.o.   MRN: [id number]  HPI Pt here for Annual exam and Medicare wellness exam.  Pt has aphasia due to prior stroke and is a poor historian. He presents today alone. Pt has no complaints and is asymptomatic.    Review of Systems  Constitutional: Negative for fever, chills, diaphoresis, activity change, appetite change, fatigue and unexpected weight change.  HENT: Negative.   Eyes: Negative.   Respiratory: Negative.   Cardiovascular: Negative.   Gastrointestinal: Negative.   Genitourinary: Negative.   Musculoskeletal: Negative.   Skin: Negative.   Neurological: Positive for speech difficulty. Negative for dizziness, tremors, seizures, syncope, light-headedness, numbness and headaches.  Psychiatric/Behavioral: Negative.   All other systems reviewed and are negative.     Medicare Wellness:  1. Risk factors, based on past  M,S,F history: cardiovascular risk factors include hypertension, hyperlipidemia, CAD.  2.  Physical activities: Pt has difficulty with physical activities due right sided hemiparesis.  3.  Depression/mood: No depressed mood or history.  4.  Hearing: No major deficits  5.  ADL's: Has someone help with ADLs at home, family support.  6.  Fall risk: Low.  7.  Home safety: No problems identified.  8.  Height weight, and visual acuity; Height and weight stable. Uses prescription glasses.   9.  Counseling: try to get more regular exercise, and continue with heart healthy diet, encouraged.  10. Lab orders based on risk factors: Laboratory profile discussed.  11. Referral: We will refer to cardiology for input on whether anticoagulation is needed with A-fib confirmed by EKG today and CHADS2 score of 4.  12. Care plan: Continue present regimen, with addition of cardiology referral.  13. Cognitive assessment: Alert and oriented, normal cognition. Severe aphasia due to stroke, however able  to communicate in witting and with Yes/No questions.  Medicare wellness exam limited do to patient's severe aphasia, however was completed to the best of the patient's ability to communicate.   Past Medical History  Diagnosis Date  . GERD (gastroesophageal reflux disease)   . Hyperlipidemia   . Hypertension   . CAD (coronary artery disease)   . Stroke     History   Social History  . Marital Status: Married    Spouse Name: N/A    Number of Children: N/A  . Years of Education: N/A   Occupational History  . Not on file.   Social History Main Topics  . Smoking status: Former Research scientist (life sciences)  . Smokeless tobacco: Not on file  . Alcohol Use: No  . Drug Use: No  . Sexual Activity: Not Currently   Other Topics Concern  . Not on file   Social History Narrative  . No narrative on file    Past Surgical History  Procedure Laterality Date  . Coronary artery bypass graft      x 5  . Tonsillectomy    . Band hemorrhoidectomy    . Dental implants    . Abdominal surgery    . Back surgery    . Hernia repair    . Kidney surgery      stenosis with stent  . Cholecystectomy      Family History  Problem Relation Age of Onset  . Heart attack      fhx  . Coronary artery disease      fhx  . Heart disease Father     Allergies  Allergen Reactions  .  Ciprofloxacin Hcl   . Sulfamethoxazole-Trimethoprim     Current Outpatient Prescriptions on File Prior to Visit  Medication Sig Dispense Refill  . aspirin 81 MG tablet Take 81 mg by mouth daily.        . CRESTOR 20 MG tablet TAKE HALF TABLET BY MOUTH DAILY  30 tablet  3  . escitalopram (LEXAPRO) 20 MG tablet Take 1 tablet (20 mg total) by mouth daily.  90 tablet  1  . furosemide (LASIX) 20 MG tablet Take 1 tablet (20 mg total) by mouth daily.  14 tablet  0  . lansoprazole (PREVACID) 15 MG capsule Take 15 mg by mouth daily.        Marland Kitchen METRONIDAZOLE, TOPICAL, 0.75 % LOTN Apply topically daily.        . Multiple Vitamin (MULTIVITAMIN)  tablet Take 1 tablet by mouth daily.        Marland Kitchen NAMENDA 10 MG tablet TAKE 1 TABLET BY MOUTH EVERY DAY  30 tablet  5  . nebivolol (BYSTOLIC) 2.5 MG tablet Take 1 tablet (2.5 mg total) by mouth daily.  90 tablet  3  . sucralfate (CARAFATE) 1 G tablet Take 1 g by mouth daily.       Marland Kitchen triamterene-hydrochlorothiazide (MAXZIDE-25) 37.5-25 MG per tablet TAKE 1 TABLET BY MOUTH EVERY MORNING  30 tablet  5   No current facility-administered medications on file prior to visit.    EXAM: BP 132/70  Pulse 71  Temp(Src) 98.1 F (36.7 C) (Oral)  Resp 18  Ht 6' (1.829 m)  Wt 200 lb (90.719 kg)  BMI 27.12 kg/m2  SpO2 98%      Objective:   Physical Exam  Nursing note and vitals reviewed. Constitutional: He is oriented to person, place, and time. He appears well-developed and well-nourished. No distress.  HENT:  Head: Normocephalic and atraumatic.  Nose: Nose normal.  Mouth/Throat: Oropharynx is clear and moist. No oropharyngeal exudate.  Eyes: Conjunctivae and EOM are normal. Pupils are equal, round, and reactive to light. No scleral icterus.  Neck: Normal range of motion. Neck supple. No JVD present. No tracheal deviation present. No thyromegaly present.  Cardiovascular: Intact distal pulses.  Exam reveals no gallop and no friction rub.   No murmur heard. Irregular rate and rhythm  Pulmonary/Chest: Effort normal and breath sounds normal. No stridor. No respiratory distress. He has no wheezes. He has no rales. He exhibits no tenderness.  Abdominal: Soft. Bowel sounds are normal. He exhibits no distension and no mass. There is no tenderness. There is no rebound and no guarding.  Musculoskeletal: Normal range of motion. He exhibits no edema and no tenderness.  Lymphadenopathy:    He has no cervical adenopathy.  Neurological: He is alert and oriented to person, place, and time. He has normal reflexes. He exhibits normal muscle tone. Coordination normal.  No obvious cranial nerve defects. Aphasia    Skin: Skin is warm and dry. No rash noted. He is not diaphoretic. No erythema. No pallor.  Psychiatric: He has a normal mood and affect. His behavior is normal. Judgment and thought content normal.    Lab Results  Component Value Date   WBC 6.1 08/11/2013   HGB 13.7 08/11/2013   HCT 39.9 08/11/2013   PLT 131.0* 08/11/2013   GLUCOSE 113* 08/11/2013   CHOL 158 08/11/2013   TRIG 52.0 08/11/2013   HDL 75.20 08/11/2013   LDLDIRECT 51.5 10/07/2012   LDLCALC 72 08/11/2013   ALT 25 08/11/2013   AST 26  08/11/2013   NA 133* 08/11/2013   K 4.9 08/11/2013   CL 94* 08/11/2013   CREATININE 1.3 08/11/2013   BUN 21 08/11/2013   CO2 31 08/11/2013   TSH 2.53 08/11/2013   PSA 0.47 08/11/2013   INR 1.3 06/12/2008   HGBA1C 6.2 10/07/2012    EKG: Atrial fibrillation- rate 42 bpm. -Left axis -anterior fascicular block.   -Old anteroseptal infarct.       Assessment & Plan:  Eusevio was seen today for annual exam.  Diagnoses and associated orders for this visit:  Preventative health care - EKG 12-Lead - Ambulatory referral to Cardiac Electrophysiology  Paroxysmal atrial fibrillation Comments: CHADS score of 4. Long pauses per EKG. Send to EP to determine need for anticoag. and pacemaker. - Ambulatory referral to Cardiac Electrophysiology    Pt with CHADS2 score of 4 and note on anticoagulant per record. Difficult to communicate in detail with pt due to aphasia, however does not appear as though pt has regular cardiologist, nor has he seen one within the last year. Will refer to EP cardiology based on slow rate afib  With frequent long pauses per EKG.  Return precautions provided, and patient handout on A-fib.  Plan to follow up as needed, or for worsening or persistent symptoms despite treatment.  Patient Instructions  You'll be called to schedule an appointment with cardiology.  Continue current medications as directed.  Schedule appointment to establish care with new PCP within the next 3  months.  If emergency symptoms discussed during meeting developed, seek medical attention immediately.  Return to clinic as needed for all other concerns.

## 2013-08-27 NOTE — Progress Notes (Signed)
Pre visit review using our clinic review tool, if applicable. No additional management support is needed unless otherwise documented below in the visit note. 

## 2013-08-27 NOTE — Patient Instructions (Addendum)
You'll be called to schedule an appointment with cardiology.  Continue current medications as directed.  Schedule appointment to establish care with new PCP within the next 3 months.  If emergency symptoms discussed during meeting developed, seek medical attention immediately.  Return to clinic as needed for all other concerns.   Atrial Fibrillation Atrial fibrillation is a condition that causes your heart to beat irregularly. It may also cause your heart to beat faster than normal. Atrial fibrillation can prevent your heart from pumping blood normally. It increases your risk of stroke and heart problems. HOME CARE  Take medications as told by your doctor.  Only take medications that your doctor says are safe. Some medications can make the condition worse or happen again.  If blood thinners were prescribed by your doctor, take them exactly as told. Too much can cause bleeding. Too little and you will not have the needed protection against stroke and other problems.  Perform blood tests at home if told by your doctor.  Perform blood tests exactly as told by your doctor.  Do not drink alcohol.  Do not drink beverages with caffeine such as coffee, soda, and some teas.  Maintain a healthy weight.  Do not use diet pills unless your doctor says they are safe. They may make heart problems worse.  Follow diet instructions as told by your doctor.  Exercise regularly as told by your doctor.  Keep all follow-up appointments. GET HELP RIGHT AWAY IF:   You have chest or belly (abdominal) pain.  You feel sick to your stomach (nauseous)  You suddenly have swollen feet and ankles.  You feel dizzy.  You face, arms, or legs feel numb or weak.  There is a change in your vision or speech.  You notice a change in the speed, rhythm, or strength of your heartbeat.  You suddenly begin peeing (urinating) more often.  You get tired more easily when moving or exercising. MAKE SURE YOU:     Understand these instructions.  Will watch your condition.  Will get help right away if you are not doing well or get worse. Document Released: 11/23/2007 Document Revised: 06/10/2012 Document Reviewed: 03/26/2012 [facility] Patient Information 2015 [place], [place]. This information is not intended to replace advice given to you by your health care provider. Make sure you discuss any questions you have with your health care provider.

## 2013-08-28 ENCOUNTER — Encounter: Payer: Self-pay | Admitting: Internal Medicine

## 2013-09-03 ENCOUNTER — Ambulatory Visit (INDEPENDENT_AMBULATORY_CARE_PROVIDER_SITE_OTHER): Payer: Medicare Other | Admitting: Internal Medicine

## 2013-09-03 ENCOUNTER — Encounter: Payer: Self-pay | Admitting: Internal Medicine

## 2013-09-03 VITALS — BP 144/72 | HR 42 | Ht 72.0 in | Wt 196.0 lb

## 2013-09-03 DIAGNOSIS — I480 Paroxysmal atrial fibrillation: Secondary | ICD-10-CM

## 2013-09-03 DIAGNOSIS — Z951 Presence of aortocoronary bypass graft: Secondary | ICD-10-CM

## 2013-09-03 DIAGNOSIS — I10 Essential (primary) hypertension: Secondary | ICD-10-CM

## 2013-09-03 DIAGNOSIS — D500 Iron deficiency anemia secondary to blood loss (chronic): Secondary | ICD-10-CM

## 2013-09-03 DIAGNOSIS — I2510 Atherosclerotic heart disease of native coronary artery without angina pectoris: Secondary | ICD-10-CM

## 2013-09-03 DIAGNOSIS — I4891 Unspecified atrial fibrillation: Secondary | ICD-10-CM

## 2013-09-03 DIAGNOSIS — I69959 Hemiplegia and hemiparesis following unspecified cerebrovascular disease affecting unspecified side: Secondary | ICD-10-CM

## 2013-09-03 MED ORDER — APIXABAN 5 MG PO TABS: 5.0000 mg | ORAL_TABLET | Freq: Two times a day (BID) | ORAL | Status: DC

## 2013-09-03 NOTE — Progress Notes (Signed)
Primary Care Physician: [name], MD Referring Physician:  Kela Millin PA-C Primary Cardiologist:  Dr Carlean Jews is a 80 y.o. male with a h/o persistent atrial fibrillation who presents today for EP consultation upon referral from primary care.  The patient is aphasic s/p prior stroke and has some difficulty providing history.  He has had atrial fibrillation for several years for which he has been asymptomatic.  He has chronic anemia and therefore has not recently been on anticoagulation.  He presented to [facility] this past week for routine evaluation and was noted to have bradycardia with his afib.  He is referred to EP for further evaluation.  He is followed by Dr Claiborne Billings but has not seen him recently.   Though the patient does have a slow heart rate, his presently asymptomatic.  He denies dizziness, fatigue (above baseline), presyncope or syncope.  His primary concern is with chronic edema.  Today, he denies symptoms of palpitations, chest pain,   orthopnea, PND, or bleeding. The patient is tolerating medications without difficulties and is otherwise without complaint today.   Past Medical History  Diagnosis Date  . GERD (gastroesophageal reflux disease)   . Hyperlipidemia   . Hypertension   . CAD (coronary artery disease)     CABG x5  . Rosacea   . Persistent atrial fibrillation   . Actinic keratosis   . BPH with urinary obstruction   . Iron deficiency anemia secondary to blood loss (chronic)   . Situational depression     Severe  . History of hydronephrosis   . Cerebrovascular accident (stroke)     With right hemiparesis and persistent expressive aphasia  . Bradycardia   . Pulmonary hypertension     by echo 2013  . Tricuspid regurgitation    Past Surgical History  Procedure Laterality Date  . Coronary artery bypass graft      x 5  . Tonsillectomy    . Band hemorrhoidectomy    . Dental implants    . Abdominal surgery    . Back  surgery    . Hernia repair    . Kidney surgery      stenosis with stent  . Cholecystectomy      Current Outpatient Prescriptions  Medication Sig Dispense Refill  . CRESTOR 20 MG tablet TAKE HALF TABLET BY MOUTH DAILY  30 tablet  3  . escitalopram (LEXAPRO) 20 MG tablet Take 1 tablet (20 mg total) by mouth daily.  90 tablet  1  . furosemide (LASIX) 20 MG tablet Take 1 tablet (20 mg total) by mouth daily.  14 tablet  0  . lansoprazole (PREVACID) 15 MG capsule Take 15 mg by mouth daily.        Marland Kitchen METRONIDAZOLE, TOPICAL, 0.75 % LOTN Apply topically daily.        . Multiple Vitamin (MULTIVITAMIN) tablet Take 1 tablet by mouth daily.        Marland Kitchen NAMENDA 10 MG tablet TAKE 1 TABLET BY MOUTH EVERY DAY  30 tablet  5  . sucralfate (CARAFATE) 1 G tablet Take 1 g by mouth daily.       Marland Kitchen triamterene-hydrochlorothiazide (MAXZIDE-25) 37.5-25 MG per tablet TAKE 1 TABLET BY MOUTH EVERY MORNING  30 tablet  5  . apixaban (ELIQUIS) 5 MG TABS tablet Take 1 tablet (5 mg total) by mouth 2 (two) times daily.  60 tablet  6   No current facility-administered medications for this visit.  Allergies  Allergen Reactions  . Ciprofloxacin Hcl   . Sulfamethoxazole-Trimethoprim     History   Social History  . Marital Status: Married    Spouse Name: N/A    Number of Children: N/A  . Years of Education: N/A   Occupational History  . Not on file.   Social History Main Topics  . Smoking status: Former Research scientist (life sciences)  . Smokeless tobacco: Not on file  . Alcohol Use: No  . Drug Use: No  . Sexual Activity: Not Currently   Other Topics Concern  . Not on file   Social History Narrative   Lives in [place] with spouse.   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 Heart attack      fhx  . Coronary artery disease      fhx  . Heart disease Father     ROS- All systems are reviewed and negative except as per  the HPI above  Physical Exam: Filed Vitals:   09/03/13 0848  BP: 144/72  Pulse: 42  Height: 6' (1.829 m)  Weight: 196 lb (88.905 kg)    GEN- The patient is chronically ill appearing, alert  Head- normocephalic, atraumatic Eyes-  Sclera clear, conjunctiva pink Ears- hearing intact Oropharynx- clear Neck- supple,  Lungs- Clear to ausculation bilaterally, normal work of breathing Heart- irregular rate and rhythm  GI- soft, NT, ND, + BS Extremities- no clubbing, cyanosis, + dependant edema MS- diffuse muscle atrophy Skin- no rash or lesion Psych- euthymic mood, full affect Neuro- he has expressive aphasia  EKG today reveals afib, V rate 42 bpm, septal infarct, Qtc 412 Epic records including Dr Lulu Riding notes are reviewed  Assessment and Plan:  1. Persistent atrial fibrillation The patient has a known diagnosis of atrial fibrillation.  He has a chads2vasc score of at least 6 but is presently not anticoagulated due to prior anemia. Today, I discussed Coumadin as well as novel anticoagulants including Pradaxa, Xarelto, Savaysa, and Eliquis today as indicated for risk reduction in stroke and systemic emboli with nonvalvular atrial fibrillation.  Risks, benefits, and alternatives to each of these drugs were discussed at length today.   AVEROES data would suggest that eliquis would provide a significant reduction in stroke risk without a statistical increase in bleeding when compared to ASA which he is presently taking.  The patient is willing to initiate eliquis at this time.  Stop ASA.  Enroll in the anticoagulation clinic for close outpatient management. As he is asymptomatic, I think that rate control is a reasonable strategy long term.  I will obtain an echo to evaluate for structural changes related to afib.  2. Bradycardia He appears to be minimally symptomatic with his bradycardia. I will stop bystolic today.  We will follow for symptoms or signs or worsening AV conduction.  No  indication for pacing presently.  We will monitor very closely as he will likely require pacing with time.  3. CAD Stop bystolic and ASA Monitor for symptoms of ischemia  4. Prior stroke Stop asa and start eliquis as above  5. Pulmonary hypertension Repeat echo  Follow-up with Roderic Palau in the Afib clinic in 4 weeks. Once stable, we will return his care to Dr Claiborne Billings.

## 2013-09-03 NOTE — Patient Instructions (Signed)
Your physician has requested that you have an echocardiogram. Echocardiography is a painless test that uses sound waves to create images of your heart. It provides your doctor with information about the size and shape of your heart and how well your heart's chambers and valves are working. This procedure takes approximately one hour. There are no restrictions for this procedure.  SCHEDULE ECHO ON SAME DAY YOU ARE SEEING DONNA CARROLL, CRNP.  Your physician recommends that you schedule a follow-up appointment in: 4 weeks with Roderic Palau, CRNP Your physician has recommended you make the following change in your medication:  1. STOP ASPIRIN 2. STOP BYSTOLIC 3. BEGIN ELIQUIS 5 MG TWICE DAILY

## 2013-09-06 ENCOUNTER — Encounter: Payer: Self-pay | Admitting: Internal Medicine

## 2013-10-08 ENCOUNTER — Ambulatory Visit (HOSPITAL_COMMUNITY): Payer: Medicare Other | Attending: Internal Medicine | Admitting: Cardiology

## 2013-10-08 ENCOUNTER — Ambulatory Visit (INDEPENDENT_AMBULATORY_CARE_PROVIDER_SITE_OTHER): Payer: Medicare Other | Admitting: Internal Medicine

## 2013-10-08 ENCOUNTER — Encounter: Payer: Self-pay | Admitting: Internal Medicine

## 2013-10-08 VITALS — BP 173/81 | HR 63 | Ht 72.0 in | Wt 196.0 lb

## 2013-10-08 DIAGNOSIS — Z951 Presence of aortocoronary bypass graft: Secondary | ICD-10-CM

## 2013-10-08 DIAGNOSIS — I4891 Unspecified atrial fibrillation: Secondary | ICD-10-CM | POA: Diagnosis not present

## 2013-10-08 DIAGNOSIS — I498 Other specified cardiac arrhythmias: Secondary | ICD-10-CM

## 2013-10-08 DIAGNOSIS — I2510 Atherosclerotic heart disease of native coronary artery without angina pectoris: Secondary | ICD-10-CM

## 2013-10-08 DIAGNOSIS — I480 Paroxysmal atrial fibrillation: Secondary | ICD-10-CM

## 2013-10-08 DIAGNOSIS — I482 Chronic atrial fibrillation, unspecified: Secondary | ICD-10-CM

## 2013-10-08 DIAGNOSIS — I059 Rheumatic mitral valve disease, unspecified: Secondary | ICD-10-CM | POA: Diagnosis not present

## 2013-10-08 DIAGNOSIS — I10 Essential (primary) hypertension: Secondary | ICD-10-CM

## 2013-10-08 DIAGNOSIS — R001 Bradycardia, unspecified: Secondary | ICD-10-CM

## 2013-10-08 DIAGNOSIS — D649 Anemia, unspecified: Secondary | ICD-10-CM

## 2013-10-08 NOTE — Progress Notes (Signed)
Echo performed. 

## 2013-10-08 NOTE — Patient Instructions (Addendum)
Your physician recommends that you return for lab work in: today (CBC).  Your physician recommends that you continue on your current medications as directed. Please refer to the Current Medication list given to you today.  Your physician recommends that you schedule a follow-up appointment in: 3 months with Roderic Palau at [facility].  We will be in touch with you regarding the results of the echocardiogram you had today.

## 2013-10-08 NOTE — Progress Notes (Signed)
Primary Care Physician: [name], MD Referring Physician:  Kela Millin PA-C Primary Cardiologist:  Dr Carlean Jews is a 80 y.o. male with a h/o atrial fibrillation who presents today for EP follow up for bradycardia .    The patient is aphasic s/p prior stroke. History is from wife.  He has had permanent atrial fibrillation for several years for which he has been asymptomatic.  On last visit bystolic was stopped with v.rate now in  low 60's. Wife seems to think his energy is somewhat improved.He is referred to EP for further evaluation. Bp initially at 173/81, after several minutes, rechecked at 148/80. Though the patient does have a slow heart rate, his presently asymptomatic.  He denies dizziness, fatigue (above baseline), presyncope or syncope.  His primary concern is with chronic edema.  Eliquis was also started with chadsvasc of at least 6 and he has not noticed any ill effect. He is chronically anemic so CBC will be checked today.  Today, he denies symptoms of palpitations, chest pain,   orthopnea, PND, or bleeding. The patient is tolerating medications without difficulties and is otherwise without complaint today.   Past Medical History  Diagnosis Date  . GERD (gastroesophageal reflux disease)   . Hyperlipidemia   . Hypertension   . CAD (coronary artery disease)     CABG x5  . Rosacea   . Persistent atrial fibrillation   . Actinic keratosis   . BPH with urinary obstruction   . Iron deficiency anemia secondary to blood loss (chronic)   . Situational depression     Severe  . History of hydronephrosis   . Cerebrovascular accident (stroke)     With right hemiparesis and persistent expressive aphasia  . Bradycardia   . Pulmonary hypertension     by echo 2013  . Tricuspid regurgitation    Past Surgical History  Procedure Laterality Date  . Coronary artery bypass graft      x 5  . Tonsillectomy    . Band hemorrhoidectomy    . Dental implants     . Abdominal surgery    . Back surgery    . Hernia repair    . Kidney surgery      stenosis with stent  . Cholecystectomy      Current Outpatient Prescriptions  Medication Sig Dispense Refill  . apixaban (ELIQUIS) 5 MG TABS tablet Take 1 tablet (5 mg total) by mouth 2 (two) times daily.  60 tablet  6  . CRESTOR 20 MG tablet TAKE HALF TABLET BY MOUTH DAILY  30 tablet  3  . escitalopram (LEXAPRO) 20 MG tablet Take 1 tablet (20 mg total) by mouth daily.  90 tablet  1  . furosemide (LASIX) 20 MG tablet Take 1 tablet (20 mg total) by mouth daily.  14 tablet  0  . Multiple Vitamin (MULTIVITAMIN) tablet Take 1 tablet by mouth daily.        Marland Kitchen NAMENDA 10 MG tablet TAKE 1 TABLET BY MOUTH EVERY DAY  30 tablet  5  . triamterene-hydrochlorothiazide (MAXZIDE-25) 37.5-25 MG per tablet TAKE 1 TABLET BY MOUTH EVERY MORNING  30 tablet  5   No current facility-administered medications for this visit.    Allergies  Allergen Reactions  . Ciprofloxacin Hcl   . Sulfamethoxazole-Trimethoprim     History   Social History  . Marital Status: Married    Spouse Name: N/A    Number of Children: N/A  .  Years of Education: N/A   Occupational History  . Not on file.   Social History Main Topics  . Smoking status: Former Research scientist (life sciences)  . Smokeless tobacco: Not on file  . Alcohol Use: No  . Drug Use: No  . Sexual Activity: Not Currently   Other Topics Concern  . Not on file   Social History Narrative   Lives in [place] with spouse.   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 Heart attack      fhx  . Coronary artery disease      fhx  . Heart disease Father     ROS- All systems are reviewed and negative except as per the HPI above  Physical Exam: Filed Vitals:   10/08/13 1132  BP: 173/81  Height: 6' (1.829 m)  Weight: 88.905 kg (196 lb)    GEN- The patient is chronically ill  appearing, alert  Head- normocephalic, atraumatic Eyes-  Sclera clear, conjunctiva pink Ears- hearing intact Oropharynx- clear Neck- supple,  Lungs- Clear to ausculation bilaterally, normal work of breathing Heart- irregular rate and rhythm  GI- soft, NT, ND, + BS Extremities- no clubbing, cyanosis, + dependant edema MS- diffuse muscle atrophy Skin- no rash or lesion Psych- euthymic mood, full affect Neuro- he has expressive aphasia  EKG today reveals afib, V rate 63bpm septal infarct, Qtc 444. Echo today reveals preserved EF, mild MR, moderate biatrial enlargement  Assessment and Plan:  1. Persistent atrial fibrillation The patient has a known diagnosis of atrial fibrillation.  He has a chads2vasc score of at least 6 with h/o chronic anemia. CBC to be checked today.  As he is asymptomatic, I think that rate control is a reasonable strategy long term. Echo performed today, results not yet available.  2. Bradycardia Improved off Bystolic  We will follow for symptoms or signs or worsening AV conduction.  No indication for pacing presently.  We will monitor very closely as he will likely require pacing with time.  3. CAD Bystolic and ASA d/c  without current symptoms of ischemia.    4. Prior stroke on eliquis.   5. Pulmonary hypertension Echo is reviewed   Recheck in 3 months.

## 2013-10-10 ENCOUNTER — Ambulatory Visit (INDEPENDENT_AMBULATORY_CARE_PROVIDER_SITE_OTHER): Payer: Medicare Other | Admitting: *Deleted

## 2013-10-10 DIAGNOSIS — I4891 Unspecified atrial fibrillation: Secondary | ICD-10-CM

## 2013-10-10 DIAGNOSIS — I482 Chronic atrial fibrillation, unspecified: Secondary | ICD-10-CM

## 2013-10-10 LAB — CBC WITH DIFFERENTIAL/PLATELET
Basophils Absolute: 0 10*3/uL (ref 0.0–0.1)
Basophils Relative: 0.5 % (ref 0.0–3.0)
EOS ABS: 0.1 10*3/uL (ref 0.0–0.7)
Eosinophils Relative: 1 % (ref 0.0–5.0)
HEMATOCRIT: 41.6 % (ref 39.0–52.0)
Hemoglobin: 13.8 g/dL (ref 13.0–17.0)
LYMPHS ABS: 1.5 10*3/uL (ref 0.7–4.0)
Lymphocytes Relative: 26.7 % (ref 12.0–46.0)
MCHC: 33.1 g/dL (ref 30.0–36.0)
MCV: 96.7 fl (ref 78.0–100.0)
MONO ABS: 1 10*3/uL (ref 0.1–1.0)
Monocytes Relative: 16.8 % — ABNORMAL HIGH (ref 3.0–12.0)
NEUTROS PCT: 55 % (ref 43.0–77.0)
Neutro Abs: 3.1 10*3/uL (ref 1.4–7.7)
PLATELETS: 139 10*3/uL — AB (ref 150.0–400.0)
RBC: 4.3 Mil/uL (ref 4.22–5.81)
RDW: 14.1 % (ref 11.5–15.5)
WBC: 5.7 10*3/uL (ref 4.0–10.5)

## 2013-12-05 ENCOUNTER — Ambulatory Visit (HOSPITAL_COMMUNITY)
Admission: RE | Admit: 2013-12-05 | Discharge: 2013-12-05 | Disposition: A | Discharge status: Home / Self Care | Payer: Medicare Other | Source: Ambulatory Visit | Attending: Nurse Practitioner | Admitting: Nurse Practitioner

## 2013-12-05 ENCOUNTER — Encounter (HOSPITAL_COMMUNITY): Payer: Self-pay | Admitting: Nurse Practitioner

## 2013-12-05 VITALS — BP 134/80 | HR 77 | Resp 18 | Wt 198.4 lb

## 2013-12-05 DIAGNOSIS — Z7901 Long term (current) use of anticoagulants: Secondary | ICD-10-CM | POA: Diagnosis not present

## 2013-12-05 DIAGNOSIS — K219 Gastro-esophageal reflux disease without esophagitis: Secondary | ICD-10-CM | POA: Diagnosis not present

## 2013-12-05 DIAGNOSIS — I481 Persistent atrial fibrillation: Secondary | ICD-10-CM | POA: Diagnosis not present

## 2013-12-05 DIAGNOSIS — E785 Hyperlipidemia, unspecified: Secondary | ICD-10-CM | POA: Insufficient documentation

## 2013-12-05 DIAGNOSIS — I2510 Atherosclerotic heart disease of native coronary artery without angina pectoris: Secondary | ICD-10-CM | POA: Insufficient documentation

## 2013-12-05 DIAGNOSIS — I10 Essential (primary) hypertension: Secondary | ICD-10-CM | POA: Insufficient documentation

## 2013-12-05 DIAGNOSIS — I69820 Aphasia following other cerebrovascular disease: Secondary | ICD-10-CM | POA: Insufficient documentation

## 2013-12-05 DIAGNOSIS — Z79899 Other long term (current) drug therapy: Secondary | ICD-10-CM | POA: Insufficient documentation

## 2013-12-05 DIAGNOSIS — I69851 Hemiplegia and hemiparesis following other cerebrovascular disease affecting right dominant side: Secondary | ICD-10-CM | POA: Diagnosis not present

## 2013-12-05 DIAGNOSIS — Z951 Presence of aortocoronary bypass graft: Secondary | ICD-10-CM | POA: Diagnosis not present

## 2013-12-05 DIAGNOSIS — I4891 Unspecified atrial fibrillation: Secondary | ICD-10-CM | POA: Diagnosis present

## 2013-12-05 DIAGNOSIS — I482 Chronic atrial fibrillation, unspecified: Secondary | ICD-10-CM

## 2013-12-05 NOTE — Patient Instructions (Signed)
Follow up with Dr. Rayann Heman at [facility] office in 3 months. Please call to make this appointment at your convenience. [phone]

## 2013-12-05 NOTE — Progress Notes (Signed)
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 Marland Kitchen  He has had permanent atrial fibrillation for several years for which he has been asymptomatic.  On last visit bystolic was stopped with v.rate now in  low 60's. Heart rate is currently in the upper 50's but he indicates he is not dizzy or symptomatic with this.  Overall, he feels well without any complaints. Some bruising of forearms with Eliquis but no other concerns. CBC was checked in August on Eliquis with normal H&H, h/o stable thrombocytopenia.  Today, he denies symptoms of palpitations, chest pain,   orthopnea, PND, or bleeding. The patient is tolerating medications without difficulties and is otherwise without complaint today.   Past Medical History  Diagnosis Date  . GERD (gastroesophageal reflux disease)   . Hyperlipidemia   . Hypertension   . CAD (coronary artery disease)     CABG x5  . Rosacea   . Persistent atrial fibrillation   . Actinic keratosis   . BPH with urinary obstruction   . Iron deficiency anemia secondary to blood loss (chronic)   . Situational depression     Severe  . History of hydronephrosis   . Cerebrovascular accident (stroke)     With right hemiparesis and persistent expressive aphasia  . Bradycardia   . Pulmonary hypertension     by echo 2013  . Tricuspid regurgitation    Past Surgical History  Procedure Laterality Date  . Coronary artery bypass graft      x 5  . Tonsillectomy    . Band hemorrhoidectomy    . Dental implants    . Abdominal surgery    . Back surgery    . Hernia repair    . Kidney surgery      stenosis with stent  . Cholecystectomy      Current Outpatient Prescriptions  Medication Sig Dispense Refill  . apixaban (ELIQUIS) 5 MG TABS tablet  Take 1 tablet (5 mg total) by mouth 2 (two) times daily.  60 tablet  6  . escitalopram (LEXAPRO) 20 MG tablet Take 1 tablet (20 mg total) by mouth daily.  90 tablet  1  . furosemide (LASIX) 20 MG tablet Take 1 tablet (20 mg total) by mouth daily.  14 tablet  0  . Multiple Vitamin (MULTIVITAMIN) tablet Take 1 tablet by mouth daily.        Marland Kitchen NAMENDA 10 MG tablet TAKE 1 TABLET BY MOUTH EVERY DAY  30 tablet  5  . rosuvastatin (CRESTOR) 20 MG tablet TAKE 1 TABLET BY MOUTH DAILY      . triamterene-hydrochlorothiazide (MAXZIDE-25) 37.5-25 MG per tablet TAKE 1 TABLET BY MOUTH EVERY MORNING  30 tablet  5   No current facility-administered medications for this encounter.    Allergies  Allergen Reactions  . Ciprofloxacin Hcl   . Sulfamethoxazole-Trimethoprim     History   Social History  . Marital Status: Married    Spouse Name: N/A    Number of Children: N/A  . Years of Education: N/A   Occupational History  . Not on file.   Social History Main Topics  . Smoking status: Former Research scientist (life sciences)  . Smokeless tobacco: Not on file  . Alcohol Use: No  .  Drug Use: No  . Sexual Activity: Not Currently   Other Topics Concern  . Not on file   Social History Narrative   Lives in [place] with spouse.   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 Heart attack      fhx  . Coronary artery disease      fhx  . Heart disease Father     ROS- All systems are reviewed and negative except as per the HPI above  Physical Exam: Filed Vitals:   12/05/13 1357  BP: 134/80  Pulse: 77  Resp: 18  Weight: 198 lb 6 oz (89.982 kg)  SpO2: 99%    GEN- The patient is chronically ill appearing, alert., aphasic. Head- normocephalic, atraumatic Eyes-  Sclera clear, conjunctiva pink Ears- hearing intact Oropharynx- clear Neck- supple,  Lungs- Clear to ausculation bilaterally, normal work of  breathing Heart- irregular rate and rhythm  GI- soft, NT, ND, + BS Extremities- no clubbing, cyanosis, + dependant edema MS- diffuse muscle atrophy Skin- no rash or lesion Psych- euthymic mood, full affect Neuro- he has expressive aphasia  EKG today reveals afib, V rate 57 bpm, septal infarct, Qtc 420 ms. Echo today reveals preserved EF, mild MR, moderate biatrial enlargement  Assessment and Plan:  1. Persistent atrial fibrillation The patient has a known diagnosis of permanent atrial fibrillation. Currently rate controlled without meds and would avoid due to asymptomatic bradycardia. He knows to report any dizziness, presyncopy/syncopy if occur and will follow for worsening AV conduction. May require PPM in the future.  2. He has a chads2vasc score of at least 6 with previous stroke. Continue NOAC.Marland Kitchen   3. CAD  without current symptoms of ischemia.        Recheck  With Dr. Rayann Heman in 4 months.

## 2013-12-06 NOTE — Addendum Note (Signed)
Encounter addended by: Evalee Mutton, CCT on: 12/06/2013  3:15 PM<BR>     Documentation filed: Charges VN

## 2014-01-01 ENCOUNTER — Telehealth: Payer: Self-pay

## 2014-01-01 NOTE — Telephone Encounter (Signed)
This is not a medication we fill

## 2014-01-02 ENCOUNTER — Telehealth: Payer: Self-pay | Admitting: Internal Medicine

## 2014-01-02 MED ORDER — MEMANTINE HCL 10 MG PO TABS: ORAL_TABLET | ORAL | Status: DC

## 2014-01-02 NOTE — Telephone Encounter (Signed)
rx sent in electronically 

## 2014-01-02 NOTE — Telephone Encounter (Signed)
Pt saw PA tucker on 08-27-13 and needs refill on namenda 10 mg #30 w/refills sent to [facility]

## 2014-01-02 NOTE — Telephone Encounter (Signed)
Will need primary physician or neurology to fill this medicine. EP does not fill this.

## 2014-03-10 ENCOUNTER — Other Ambulatory Visit: Payer: Self-pay

## 2014-03-10 MED ORDER — TRIAMTERENE-HCTZ 37.5-25 MG PO TABS: 1.0000 | ORAL_TABLET | Freq: Every morning | ORAL | Status: DC

## 2014-03-10 NOTE — Telephone Encounter (Signed)
Pt came into the office with a list of his medications and all this medication bottles.  Pt did not have a bottle for Maxzide 37.5-25 mg- Take 1 tablet by mouth ever morning.    Rx sent to pharmacy

## 2014-03-30 ENCOUNTER — Other Ambulatory Visit: Payer: Self-pay | Admitting: Internal Medicine

## 2014-03-30 ENCOUNTER — Encounter: Payer: Self-pay | Admitting: Internal Medicine

## 2014-03-30 ENCOUNTER — Ambulatory Visit (INDEPENDENT_AMBULATORY_CARE_PROVIDER_SITE_OTHER): Payer: Medicare Other | Admitting: Internal Medicine

## 2014-03-30 VITALS — BP 144/74 | HR 62 | Ht 72.0 in | Wt 198.4 lb

## 2014-03-30 DIAGNOSIS — I482 Chronic atrial fibrillation, unspecified: Secondary | ICD-10-CM

## 2014-03-30 DIAGNOSIS — I10 Essential (primary) hypertension: Secondary | ICD-10-CM

## 2014-03-30 DIAGNOSIS — I480 Paroxysmal atrial fibrillation: Secondary | ICD-10-CM

## 2014-03-30 NOTE — Progress Notes (Signed)
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 The patient is aphasic s/p prior stroke.   He has had permanent atrial fibrillation for several years for which he has been asymptomatic.  On last visit bystolic was stopped in the recent past due to bradycardia. Heart rate is currently in the 60's. He tries to explain to me with great difficulty that his bp is up and down at home. Upper reading is an occasional 180/80 with the average systolic around [id number]. His wife reports he consumes a lot of salt/salty foods.  Overall, he feels well without any complaints.  CBC was checked in August on Eliquis with normal H&H, h/o stable thrombocytopenia.  Today, he denies symptoms of palpitations, chest pain,   orthopnea, PND, or bleeding. The patient is tolerating medications without difficulties and is otherwise without complaint today.   Past Medical History  Diagnosis Date  . GERD (gastroesophageal reflux disease)   . Hyperlipidemia   . Hypertension   . CAD (coronary artery disease)     CABG x5  . Rosacea   . Persistent atrial fibrillation   . Actinic keratosis   . BPH with urinary obstruction   . Iron deficiency anemia secondary to blood loss (chronic)   . Situational depression     Severe  . History of hydronephrosis   . Cerebrovascular accident (stroke)     With right hemiparesis and persistent expressive aphasia  . Bradycardia   . Pulmonary hypertension     by echo 2013  . Tricuspid regurgitation    Past Surgical History  Procedure Laterality Date  . Coronary artery bypass graft      x 5  . Tonsillectomy    . Band hemorrhoidectomy    . Dental implants    . Abdominal surgery    . Back surgery    . Hernia repair    . Kidney surgery      stenosis with stent  .  Cholecystectomy      Current Outpatient Prescriptions  Medication Sig Dispense Refill  . apixaban (ELIQUIS) 5 MG TABS tablet Take 1 tablet (5 mg total) by mouth 2 (two) times daily. 60 tablet 6  . escitalopram (LEXAPRO) 20 MG tablet Take 1 tablet (20 mg total) by mouth daily. 90 tablet 1  . furosemide (LASIX) 20 MG tablet Take 1 tablet (20 mg total) by mouth daily. 14 tablet 0  . memantine (NAMENDA) 10 MG tablet TAKE 1 TABLET BY MOUTH EVERY DAY 30 tablet 5  . Multiple Vitamin (MULTIVITAMIN) tablet Take 1 tablet by mouth daily.      . rosuvastatin (CRESTOR) 20 MG tablet TAKE 1 TABLET BY MOUTH DAILY    . triamterene-hydrochlorothiazide (MAXZIDE-25) 37.5-25 MG per tablet Take 1 tablet by mouth every morning. 30 tablet 5   No current facility-administered medications for this visit.    Allergies  Allergen Reactions  . Ciprofloxacin Hcl   . Sulfamethoxazole-Trimethoprim     History   Social History  . Marital Status: Married    Spouse Name: N/A    Number of Children: N/A  . Years of Education: N/A   Occupational History  . Not on file.   Social History Main Topics  . Smoking status: Former Research scientist (life sciences)  . Smokeless tobacco:  Not on file  . Alcohol Use: No  . Drug Use: No  . Sexual Activity: Not Currently   Other Topics Concern  . Not on file   Social History Narrative   Lives in [place] with spouse.   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 Heart attack      fhx  . Coronary artery disease      fhx  . Heart disease Father     ROS- All systems are reviewed and negative except as per the HPI above  Physical Exam: BP 144/74 mmHg  Pulse 62  Ht 6' (1.829 m)  Wt 198 lb 6.4 oz (89.994 kg)  BMI 26.90 kg/m2  GEN- The patient is chronically ill appearing, alert., aphasic. Head- normocephalic, atraumatic Eyes-  Sclera clear, conjunctiva pink Ears- hearing  intact Oropharynx- clear Neck- supple,  Lungs- Clear to ausculation bilaterally, normal work of breathing Heart- irregular rate and rhythm  GI- soft, NT, ND, + BS Extremities- no clubbing, cyanosis, + dependant edema MS- diffuse muscle atrophy Skin- no rash or lesion Psych- euthymic mood, full affect Neuro- he has expressive aphasia  EKG today reveals afib, V rate 62 bpm, septal infarct, ager undetermined.  Echoreveals preserved EF, mild MR, moderate biatrial enlargement  Assessment and Plan:  1. Persistent atrial fibrillation Stable, no change.  He has a chads2vasc score of at least 6 with previous stroke. Continue life long anticoagulation   2. CAD  without current symptoms of ischemia. No changes today  3. Hypertension Pt reports flunctuation at home of BP. Asked to document and bring to next visit. Avoid salt.    Recheck  afib clinic in 3 months with Roderic Palau NP.  He can follow primarily with Butch Penny and Dr Claiborne Billings going forward.

## 2014-03-30 NOTE — Patient Instructions (Signed)
Your physician recommends that you schedule a follow-up appointment in: 3 months with Roderic Palau, NP

## 2014-04-01 ENCOUNTER — Other Ambulatory Visit: Payer: Self-pay

## 2014-06-11 ENCOUNTER — Telehealth: Payer: Self-pay

## 2014-06-11 MED ORDER — ROSUVASTATIN CALCIUM 20 MG PO TABS: 20.0000 mg | ORAL_TABLET | Freq: Every day | ORAL | Status: DC

## 2014-06-11 MED ORDER — MEMANTINE HCL 10 MG PO TABS: ORAL_TABLET | ORAL | Status: DC

## 2014-06-11 NOTE — Telephone Encounter (Signed)
[facility] refill request for memantine (NAMENDA) 10 MG tablet and rosuvastatin (CRESTOR) 20 MG tablet. Last filled 05/12/14. Will establish with [facility] 06/2014

## 2014-06-11 NOTE — Telephone Encounter (Signed)
Medications refilled

## 2014-07-06 ENCOUNTER — Telehealth: Payer: Self-pay | Admitting: Internal Medicine

## 2014-07-06 ENCOUNTER — Telehealth: Payer: Self-pay | Admitting: Cardiovascular Disease

## 2014-07-06 NOTE — Telephone Encounter (Signed)
Walk in pt form-pt dropped off DMV paper per Dr.Allred Dr.Kelly needs to complete this form I have sent interoffice to [facility] office Pt has also been made aware of this also. 5.9.16/km

## 2014-07-06 NOTE — Telephone Encounter (Signed)
Walk in pt form-Envelope dropped off-gave to [employer]

## 2014-07-07 ENCOUNTER — Encounter (HOSPITAL_COMMUNITY): Payer: Self-pay | Admitting: Nurse Practitioner

## 2014-07-07 ENCOUNTER — Ambulatory Visit (HOSPITAL_COMMUNITY)
Admission: RE | Admit: 2014-07-07 | Discharge: 2014-07-07 | Disposition: A | Discharge status: Home / Self Care | Payer: Medicare Other | Source: Ambulatory Visit | Attending: Nurse Practitioner | Admitting: Nurse Practitioner

## 2014-07-07 VITALS — BP 140/90 | HR 53 | Ht 72.0 in | Wt 196.0 lb

## 2014-07-07 DIAGNOSIS — R001 Bradycardia, unspecified: Secondary | ICD-10-CM | POA: Diagnosis not present

## 2014-07-07 DIAGNOSIS — I482 Chronic atrial fibrillation, unspecified: Secondary | ICD-10-CM

## 2014-07-07 DIAGNOSIS — Z8673 Personal history of transient ischemic attack (TIA), and cerebral infarction without residual deficits: Secondary | ICD-10-CM | POA: Diagnosis not present

## 2014-07-07 DIAGNOSIS — I10 Essential (primary) hypertension: Secondary | ICD-10-CM | POA: Insufficient documentation

## 2014-07-07 NOTE — Progress Notes (Signed)
Name: [name] DOB: 11-18-1928     MRN: [id number]   Primary Care Physician: [name], MD Primary Cardiologist: Primary Electrophysiologist: Dr. Rayann Heman Referring Physician:   MCKADEN [name] is a 81 y.o. male with a h/o long standing persistent atrial fibrillation who presents for f/u in the [facility].  The patient is aphasic due to previous stroke and is here with his daughter. He is off chronotropic drugs due to bradycardia and appears to be asymptomatic with bradycardia. EKG shows Afib at 53 bpm.Taking eliquis without bleeding issues with a chadsvasc of at least 6.  Today, he denies symptoms of palpitations, chest pain, shortness of breath, orthopnea, PND, lower extremity edema, dizziness, presyncope, syncope, snoring, daytime somnolence, bleeding, or neurologic sequela. The patient is tolerating medications without difficulties and is otherwise without complaint today.    Past Medical History  Diagnosis Date  . GERD (gastroesophageal reflux disease)   . Hyperlipidemia   . Hypertension   . CAD (coronary artery disease)     CABG x5  . Rosacea   . Persistent atrial fibrillation   . Actinic keratosis   . BPH with urinary obstruction   . Iron deficiency anemia secondary to blood loss (chronic)   . Situational depression     Severe  . History of hydronephrosis   . Cerebrovascular accident (stroke)     With right hemiparesis and persistent expressive aphasia  . Bradycardia   . Pulmonary hypertension     by echo 2013  . Tricuspid regurgitation    Past Surgical History  Procedure Laterality Date  . Coronary artery bypass graft      x 5  . Tonsillectomy    . Band hemorrhoidectomy    . Dental implants    . Abdominal surgery    . Back surgery    . Hernia repair    . Kidney surgery      stenosis with stent  . Cholecystectomy      @ENCMED @  Allergies  Allergen Reactions  . Ciprofloxacin Hcl     unknown  .  Sulfamethoxazole-Trimethoprim     unknown    History   Social History  . Marital Status: Married    Spouse Name: N/A  . Number of Children: N/A  . Years of Education: N/A   Occupational History  . Not on file.   Social History Main Topics  . Smoking status: Former Research scientist (life sciences)  . Smokeless tobacco: Not on file  . Alcohol Use: No  . Drug Use: No  . Sexual Activity: Not Currently   Other Topics Concern  . Not on file   Social History Narrative   Lives in [place] with spouse.   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 Heart attack      fhx  . Coronary artery disease      fhx  . Heart disease Father    The patient does not have a history of early familial atrial fibrillation or other arrhythmias.  ROS- All systems are reviewed and negative except as per the HPI above.  Physical Exam: Filed Vitals:   07/07/14 1132  BP: 140/90  Pulse: 53  Height: 6' (1.829 m)  Weight: 196 lb (88.905 kg)    GEN- The patient is well appearing, alert and oriented x 3 today.   Head- normocephalic, atraumatic Eyes-  Sclera  clear, conjunctiva pink Ears- hearing intact Oropharynx- clear Neck- supple, no JVP Lymph- no cervical lymphadenopathy Lungs- Clear to ausculation bilaterally, normal work of breathing Heart- Irregular rate and rhythm, no murmurs, rubs or gallops, PMI not laterally displaced GI- soft, NT, ND, + BS Extremities- no clubbing, cyanosis, or edema MS- no significant deformity or atrophy Skin- no rash or lesion Psych- euthymic mood, full affect Neuro- strength and sensation are intact  EKG afib with slow ventricular response.53 bpm, LAD, NSTT wave changes.   BP Readings from Last 3 Encounters:  03/30/14 144/74  10/08/13 173/81  09/03/13 144/72   Wt Readings from Last 3 Encounters:  03/30/14 198 lb 6.4 oz (89.994 kg)  10/08/13 196 lb (88.905 kg)  09/03/13  196 lb (88.905 kg)   He has a BMI of Body mass index is 26.58 kg/(m^2)..  CBC Latest Ref Rng 10/10/2013 08/11/2013 06/07/2012  WBC 4.0 - 10.5 K/uL 5.7 6.1 5.4  Hemoglobin 13.0 - 17.0 g/dL 13.8 13.7 13.1  Hematocrit 39.0 - 52.0 % 41.6 39.9 39.2  Platelets 150.0 - 400.0 K/uL 139.0(L) 131.0(L) 127.0(L)   BMP Latest Ref Rng 08/11/2013 07/10/2012 07/14/2011  Glucose 70 - 99 mg/dL 113(H) 135(H) 102(H)  BUN 6 - 23 mg/dL 21 22 18   Creatinine 0.4 - 1.5 mg/dL 1.3 1.3 0.90  Sodium 135 - 145 mEq/L 133(L) 136 133(L)  Potassium 3.5 - 5.1 mEq/L 4.9 4.2 3.5  Chloride 96 - 112 mEq/L 94(L) 98 99  CO2 19 - 32 mEq/L 31 31 25   Calcium 8.4 - 10.5 mg/dL 9.4 9.2 8.7    Lipid Panel     Component Value Date/Time   CHOL 158 08/11/2013 0952   TRIG 52.0 08/11/2013 0952   TRIG 41 12/29/2005 0930   HDL 75.20 08/11/2013 0952   CHOLHDL 2 08/11/2013 0952   CHOLHDL 2.7 CALC 12/29/2005 0930   VLDL 10.4 08/11/2013 0952   LDLCALC 72 08/11/2013 0952   LDLDIRECT 51.5 10/07/2012 1521     [facility] records are reviewed  today.  Assessment and Plan:  1. Atrial fibrillation The patient has asymptomatic longstanding persistent atrial fibrillation with slow ventricular response.Marland Kitchen  He  is  appropriately anticoagulated at this time. The patient is adequately rate controlled without any drugs. Will need to follow closely for symptoms related to bradycardia and/or need for PPM.   2. HTN Stable  3. Previous CVA with aphasia Continue Eliquis with chadsvasc of at least 6. Steady on feet, no falls.  F/u in 3 months.       Roderic Palau NP  Nurse Practitioner, [facility] 07/07/2014 11:59 AM   [name], male   DOB: [birth year], 81 y.o.   MRN: [id number]

## 2014-07-07 NOTE — Patient Instructions (Signed)
Follow up in 3 months with Afib Clinic -- parking code for August is [place]  Call sooner if needed [phone]

## 2014-07-08 ENCOUNTER — Telehealth: Payer: Self-pay | Admitting: *Deleted

## 2014-07-08 NOTE — Telephone Encounter (Signed)
Spoke with patient's wife in reference to a DMV form that was sent over to Dr. Claiborne Billings via inner office mail to be completed. Notified her that Dr. Claiborne Billings will not be able to complete the form for 2 reasons.   1.) the form says that it needs to be completed by a licensed Ophthalmologist or Optometrist. 2.) Dr. Claiborne Billings hasn't seen this patient since 01/14/10.  The wife says for me to mail the form back to them. She thinks that they do not need it. The form will be mailed to their home address.

## 2014-07-21 ENCOUNTER — Ambulatory Visit (INDEPENDENT_AMBULATORY_CARE_PROVIDER_SITE_OTHER): Payer: Medicare Other | Admitting: Family Medicine

## 2014-07-21 ENCOUNTER — Encounter: Payer: Self-pay | Admitting: Family Medicine

## 2014-07-21 VITALS — BP 160/70 | HR 64 | Temp 97.8°F | Wt 199.0 lb

## 2014-07-21 DIAGNOSIS — I69351 Hemiplegia and hemiparesis following cerebral infarction affecting right dominant side: Secondary | ICD-10-CM

## 2014-07-21 DIAGNOSIS — Z23 Encounter for immunization: Secondary | ICD-10-CM | POA: Diagnosis not present

## 2014-07-21 DIAGNOSIS — I10 Essential (primary) hypertension: Secondary | ICD-10-CM

## 2014-07-21 DIAGNOSIS — I69851 Hemiplegia and hemiparesis following other cerebrovascular disease affecting right dominant side: Secondary | ICD-10-CM | POA: Diagnosis not present

## 2014-07-21 DIAGNOSIS — I482 Chronic atrial fibrillation, unspecified: Secondary | ICD-10-CM

## 2014-07-21 DIAGNOSIS — I2510 Atherosclerotic heart disease of native coronary artery without angina pectoris: Secondary | ICD-10-CM

## 2014-07-21 DIAGNOSIS — R413 Other amnesia: Secondary | ICD-10-CM

## 2014-07-21 NOTE — Patient Instructions (Signed)
Check your blood pressure every other day and write down #. Goal <140/90. If regularly above that, see me sooner, if running below that see me in 4-8 weeks for recheck BP in office

## 2014-07-21 NOTE — Assessment & Plan Note (Signed)
S:Had been on namenda for years by Dr. Arnoldo Morale without clear indication in history or problem list.  A/P: incredibly difficult situation as hard to assess safety of driving from memory loss perspective. Could be alzheimer's dementia, could be vascular. Cannot complete MMSE due to aphasia. Did have one episode of getting lost in town so hard to say if truly safe to drive but this has been an isolated incident. i recommended OT eval by DMV but also asked wife to watch patient closely and if had episodes of getting lost, may have to add in qualifier that he can only drive supervised.   Currently only driving within 12 miles of home, under 45 mph, not on interstate, with glasses, and during day, with wheel knob recommendation also listed. I think these are reasonable steps. Filled out DMV form but they only brought 2 pages so may have additional paperwork to fill out and asked Keba to keep my initial pages as a copy.

## 2014-07-21 NOTE — Assessment & Plan Note (Signed)
S:S/p CABG x5. Crestor 20mg . asymptomatic A/P: on eliquis for a fib but will reach out to cards and see if patient should also be on aspirin if they prefer eliquis aloen due to bleeding risk. Continue statin.

## 2014-07-21 NOTE — Assessment & Plan Note (Signed)
S:Poor control in office on Triamterene-hctz 37.5-25. Patient states controlled at home when compared to 14//90 but does not bring #s with him.  A/P: we will return in 4 weeks for repeat evaluation and bring log of home #s. Consider amlodipine if poor control perhaps low dose as does have some edema and had been on lasix in past.

## 2014-07-21 NOTE — Assessment & Plan Note (Signed)
S: tends to run brady so not on rate control; anticoag with eliquis 5 mg BID. Rate controlled without meds A/P: chronic a fib but rate controlled without meds. Continue current rx.

## 2014-07-21 NOTE — Assessment & Plan Note (Signed)
S:Stroke 11/2001 likely a fib related now on eliquis A/P: strength reasonable at 4/5 and think safely could drive car though having wheel knob advised in past per current license and asked for this to be evaled by OT. Continue eliquis for prevention

## 2014-07-21 NOTE — Progress Notes (Signed)
[name] Reddish, MD Phone: [phone]  Subjective:  Patient presents today to establish care with me as their new primary care provider. Patient was formerly a patient of Dr. Arnoldo Morale. Chief complaint-noted.   See problem oriented charting ROS per head shaking and wife report- no chest pain, shortness of breath, nausea, vomiting, myalgias. Denies palpitations  The following were reviewed and entered/updated in epic: Past Medical History  Diagnosis Date  . GERD (gastroesophageal reflux disease)   . Hyperlipidemia   . Hypertension   . CAD (coronary artery disease)     CABG x5  . Rosacea   . Persistent atrial fibrillation   . Actinic keratosis   . BPH with urinary obstruction   . Iron deficiency anemia secondary to blood loss (chronic)   . Situational depression     Severe  . History of hydronephrosis   . Cerebrovascular accident (stroke)     With right hemiparesis and persistent expressive aphasia  . Bradycardia   . Pulmonary hypertension     by echo 2013  . Tricuspid regurgitation   . Iron deficiency anemia due to chronic blood loss 03/26/2007    Now resolved    . Hx of hydronephrosis 07/08/2011    right    Patient Active Problem List   Diagnosis Date Noted  . Memory loss 07/21/2014    Priority: High  . Chronic atrial fibrillation 07/08/2011    Priority: High  . Hemiparesis affecting right side as late effect of cerebrovascular accident 03/26/2007    Priority: High  . CAD (coronary artery disease) 08/23/2006    Priority: High  . Bradycardia 07/09/2011    Priority: Medium  . Major depression in remission 07/26/2007    Priority: Medium  . 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    Priority: Medium  . Hyperlipidemia 08/23/2006    Priority: Medium  . Essential hypertension 08/23/2006    Priority: Medium  . Actinic keratosis 07/30/2009    Priority: Low  . GERD 08/23/2006    Priority: Low   Past Surgical History  Procedure Laterality Date    . Coronary artery bypass graft      x 5  . Tonsillectomy    . Band hemorrhoidectomy    . Dental implants    . Abdominal surgery    . Back surgery    . Hernia repair    . Kidney surgery      stenosis with stent  . Cholecystectomy      Family History  Problem Relation Age of Onset  . Heart attack      fhx  . Coronary artery disease      fhx  . Heart disease Father     Medications- reviewed and updated Current Outpatient Prescriptions  Medication Sig Dispense Refill  . ELIQUIS 5 MG TABS tablet TAKE 1 TABLET BY MOUTH TWICE DAILY 60 tablet 3  . escitalopram (LEXAPRO) 20 MG tablet Take 1 tablet (20 mg total) by mouth daily. 90 tablet 1  . memantine (NAMENDA) 10 MG tablet TAKE 1 TABLET BY MOUTH EVERY DAY 30 tablet 5  . Multiple Vitamin (MULTIVITAMIN) tablet Take 1 tablet by mouth daily.      . rosuvastatin (CRESTOR) 20 MG tablet Take 1 tablet (20 mg total) by mouth daily. 30 tablet 3  . triamterene-hydrochlorothiazide (MAXZIDE-25) 37.5-25 MG per tablet Take 1 tablet by mouth every morning. 30 tablet 5   No current facility-administered medications for this visit.    Allergies-reviewed and updated Allergies  Allergen Reactions  .  Ciprofloxacin Hcl     unknown  . Sulfamethoxazole-Trimethoprim     unknown    History   Social History  . Marital Status: Married    Spouse Name: N/A  . Number of Children: N/A  . Years of Education: N/A   Social History Main Topics  . Smoking status: Former Research scientist (life sciences)  . Smokeless tobacco: Not on file  . Alcohol Use: No  . Drug Use: No  . Sexual Activity: Not Currently   Other Topics Concern  . None   Social History Narrative   Lives in [place] with spouse. LIves at a house at a retirement home. 3 children. 6 grandkids. 5 greatgrandkids.       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 Oropharynx normal. TM obscured by wax on L (irrigated by [facility], [facility]) CV: RRR no murmurs rubs or gallops Lungs: CTAB no crackles, wheeze, rhonchi Abdomen: soft/nontender/nondistended/normal bowel sounds.  Ext: 1+ edema Skin: warm, dry, no rash  Neuro: grossly normal, moves all extremities, PERRLA   Assessment/Plan:  CAD (coronary artery disease) S:S/p CABG x5. Crestor 20mg . asymptomatic A/P: on eliquis for a fib but will reach out to cards and see if patient should also be on aspirin if they prefer eliquis aloen due to bleeding risk. Continue statin.     Hemiparesis affecting right side as late effect of cerebrovascular accident S:Stroke 11/2001 likely a fib related now on eliquis A/P: strength reasonable at 4/5 and think safely could drive car though having wheel knob advised in past per current license and asked for this to be evaled by OT. Continue eliquis for prevention    Chronic atrial fibrillation S: tends to run brady so not on rate control; anticoag with eliquis 5 mg BID. Rate controlled without meds A/P: chronic a fib but rate controlled without meds. Continue current rx.     Essential hypertension S:Poor control in office on Triamterene-hctz 37.5-25. Patient states controlled at home when compared to 14//90 but does not bring #s with him.  A/P: we will return in 4 weeks for repeat evaluation and bring log of home #s. Consider amlodipine if poor control perhaps low dose as does have some edema and had been on lasix in past.     Memory loss S:Had been on namenda for years by Dr. Arnoldo Morale without clear indication in history or problem list.  A/P: incredibly difficult situation as hard to assess safety of driving from memory loss perspective. Could be alzheimer's dementia, could be vascular. Cannot complete MMSE due to aphasia. Did have one episode of getting lost in town so hard to say if  truly safe to drive but this has been an isolated incident. i recommended OT eval by DMV but also asked wife to watch patient closely and if had episodes of getting lost, may have to add in qualifier that he can only drive supervised.   Currently only driving within 12 miles of home, under 45 mph, not on interstate, with glasses, and during day, with wheel knob recommendation also listed. I think these are reasonable steps. Filled out DMV form but they only brought 2 pages so may have additional paperwork to fill out and asked Keba to keep my initial pages as a  copy.     4 week BP check  Orders Placed This Encounter  Procedures  . Pneumococcal conjugate vaccine 13-valent

## 2014-07-22 ENCOUNTER — Telehealth: Payer: Self-pay | Admitting: Internal Medicine

## 2014-07-22 NOTE — Telephone Encounter (Signed)
Walk in pt form-DMV-paper left gave to [facility].

## 2014-08-05 ENCOUNTER — Other Ambulatory Visit: Payer: Self-pay | Admitting: Internal Medicine

## 2014-08-05 ENCOUNTER — Telehealth: Payer: Self-pay | Admitting: Internal Medicine

## 2014-08-05 NOTE — Telephone Encounter (Signed)
Forms completed  Daughter picked up

## 2014-08-05 NOTE — Telephone Encounter (Signed)
New message      Talk to [facility] regarding getting some papers filled out

## 2014-09-01 ENCOUNTER — Other Ambulatory Visit: Payer: Self-pay | Admitting: Family Medicine

## 2014-09-01 ENCOUNTER — Encounter: Payer: Self-pay | Admitting: Family Medicine

## 2014-09-01 ENCOUNTER — Ambulatory Visit (INDEPENDENT_AMBULATORY_CARE_PROVIDER_SITE_OTHER): Payer: Medicare Other | Admitting: Family Medicine

## 2014-09-01 VITALS — BP 132/66 | HR 63 | Temp 98.6°F | Wt 197.0 lb

## 2014-09-01 DIAGNOSIS — I10 Essential (primary) hypertension: Secondary | ICD-10-CM

## 2014-09-01 MED ORDER — AMLODIPINE BESYLATE 2.5 MG PO TABS: 2.5000 mg | ORAL_TABLET | Freq: Every day | ORAL | Status: DC

## 2014-09-01 NOTE — Progress Notes (Signed)
[name], MD  Subjective:  [name] is a 81 y.o. year old very pleasant male patient who presents with:  Hypertension-controlled in office, variable control at home  BP Readings from Last 3 Encounters:  09/01/14 132/66  07/21/14 160/70  07/07/14 140/90   Home BP monitoring-ranges from 120-175 at home even when checking similar times Compliant with medications-yes without side effects ROS-Denies any CP, HA, SOB, blurry vision. Chronic RLE edema after CVA   Past Medical History- CAD, a fib, memory loss, R hemiparesis, BPH, HTN  Medications- reviewed and updated Current Outpatient Prescriptions  Medication Sig Dispense Refill  . ELIQUIS 5 MG TABS tablet TAKE 1 TABLET BY MOUTH TWICE DAILY 60 tablet 3  . escitalopram (LEXAPRO) 20 MG tablet Take 1 tablet (20 mg total) by mouth daily. 90 tablet 1  . memantine (NAMENDA) 10 MG tablet TAKE 1 TABLET BY MOUTH EVERY DAY 30 tablet 5  . Multiple Vitamin (MULTIVITAMIN) tablet Take 1 tablet by mouth daily.      . rosuvastatin (CRESTOR) 20 MG tablet Take 1 tablet (20 mg total) by mouth daily. 30 tablet 3  . triamterene-hydrochlorothiazide (MAXZIDE-25) 37.5-25 MG per tablet TAKE 1 TABLET BY MOUTH EVERY MORNING 30 tablet 6   Objective: BP 132/66 mmHg  Pulse 63  Temp(Src) 98.6 F (37 C)  Wt 197 lb (89.359 kg) Gen: NAD, unclear speech CV: irregularly irregular, no murmurs rubs or gallops Lungs: CTAB no crackles, wheeze, rhonchi Abdomen: soft/nontender/nondistended/normal bowel sounds.  Ext: 1+ RLE edema, trace on left Skin: warm, dry, no rash Neuro: unclear speech, limited R sided strength  Assessment/Plan:  Essential hypertension Controlled in office. Variable control at home with range 120-175. Add amlodipine 2.5mg  to see if can reduce range of BP elevations. Hopeful can keep below 150, but may tolerate up to 160 depending on range of BPs at 3 month follow up.   3 mo f/u.   Meds ordered this encounter  Medications  .  amLODipine (NORVASC) 2.5 MG tablet    Sig: Take 1 tablet (2.5 mg total) by mouth daily.    Dispense:  30 tablet    Refill:  5

## 2014-09-01 NOTE — Patient Instructions (Signed)
Due to home #s being higher than desired, start a very low dose amlodipine 2.5mg . My hope would be to keep blood pressures between 110-150 without any dizziness improved from the 120-175 range. We may go up slightly in future. Continue to bring blood pressure log

## 2014-09-01 NOTE — Assessment & Plan Note (Signed)
Controlled in office. Variable control at home with range 120-175. Add amlodipine 2.5mg  to see if can reduce range of BP elevations. Hopeful can keep below 150, but may tolerate up to 160 depending on range of BPs at 3 month follow up.

## 2014-10-09 ENCOUNTER — Encounter (HOSPITAL_COMMUNITY): Payer: Self-pay | Admitting: Nurse Practitioner

## 2014-10-09 ENCOUNTER — Ambulatory Visit (HOSPITAL_COMMUNITY)
Admission: RE | Admit: 2014-10-09 | Discharge: 2014-10-09 | Disposition: A | Discharge status: Home / Self Care | Payer: Medicare Other | Source: Ambulatory Visit | Attending: Nurse Practitioner | Admitting: Nurse Practitioner

## 2014-10-09 VITALS — BP 120/70 | HR 61 | Ht 73.0 in | Wt 198.6 lb

## 2014-10-09 DIAGNOSIS — I272 Other secondary pulmonary hypertension: Secondary | ICD-10-CM | POA: Diagnosis not present

## 2014-10-09 DIAGNOSIS — Z7902 Long term (current) use of antithrombotics/antiplatelets: Secondary | ICD-10-CM | POA: Insufficient documentation

## 2014-10-09 DIAGNOSIS — Z951 Presence of aortocoronary bypass graft: Secondary | ICD-10-CM | POA: Insufficient documentation

## 2014-10-09 DIAGNOSIS — I2510 Atherosclerotic heart disease of native coronary artery without angina pectoris: Secondary | ICD-10-CM | POA: Diagnosis not present

## 2014-10-09 DIAGNOSIS — Z8249 Family history of ischemic heart disease and other diseases of the circulatory system: Secondary | ICD-10-CM | POA: Diagnosis not present

## 2014-10-09 DIAGNOSIS — K219 Gastro-esophageal reflux disease without esophagitis: Secondary | ICD-10-CM | POA: Diagnosis not present

## 2014-10-09 DIAGNOSIS — Z87891 Personal history of nicotine dependence: Secondary | ICD-10-CM | POA: Diagnosis not present

## 2014-10-09 DIAGNOSIS — I4891 Unspecified atrial fibrillation: Secondary | ICD-10-CM | POA: Insufficient documentation

## 2014-10-09 DIAGNOSIS — E785 Hyperlipidemia, unspecified: Secondary | ICD-10-CM | POA: Insufficient documentation

## 2014-10-09 DIAGNOSIS — I482 Chronic atrial fibrillation, unspecified: Secondary | ICD-10-CM

## 2014-10-09 DIAGNOSIS — Z882 Allergy status to sulfonamides status: Secondary | ICD-10-CM | POA: Insufficient documentation

## 2014-10-09 DIAGNOSIS — I69320 Aphasia following cerebral infarction: Secondary | ICD-10-CM | POA: Diagnosis not present

## 2014-10-09 DIAGNOSIS — I10 Essential (primary) hypertension: Secondary | ICD-10-CM | POA: Diagnosis not present

## 2014-10-09 DIAGNOSIS — Z791 Long term (current) use of non-steroidal anti-inflammatories (NSAID): Secondary | ICD-10-CM | POA: Diagnosis not present

## 2014-10-09 LAB — CBC
HCT: 39.3 % (ref 39.0–52.0)
Hemoglobin: 13.5 g/dL (ref 13.0–17.0)
MCH: 31.9 pg (ref 26.0–34.0)
MCHC: 34.4 g/dL (ref 30.0–36.0)
MCV: 92.9 fL (ref 78.0–100.0)
PLATELETS: 142 10*3/uL — AB (ref 150–400)
RBC: 4.23 MIL/uL (ref 4.22–5.81)
RDW: 13.6 % (ref 11.5–15.5)
WBC: 5.1 10*3/uL (ref 4.0–10.5)

## 2014-10-09 LAB — BASIC METABOLIC PANEL
Anion gap: 7 (ref 5–15)
BUN: 23 mg/dL — ABNORMAL HIGH (ref 6–20)
CALCIUM: 9.2 mg/dL (ref 8.9–10.3)
CO2: 32 mmol/L (ref 22–32)
Chloride: 98 mmol/L — ABNORMAL LOW (ref 101–111)
Creatinine, Ser: 1.32 mg/dL — ABNORMAL HIGH (ref 0.61–1.24)
GFR calc Af Amer: 56 mL/min — ABNORMAL LOW (ref >60)
GFR, EST NON AFRICAN AMERICAN: 48 mL/min — AB (ref >60)
Glucose, Bld: 105 mg/dL — ABNORMAL HIGH (ref 65–99)
Potassium: 5.1 mmol/L (ref 3.5–5.1)
SODIUM: 137 mmol/L (ref 135–145)

## 2014-10-09 LAB — TSH: TSH: 3.056 u[IU]/mL (ref 0.350–4.500)

## 2014-10-09 NOTE — Progress Notes (Signed)
[name], male   DOB: [birth year], 81 y.o.   MRN: [id number]       Primary Care Physician: [name], MD Primary Electrophysiologist: Dr. Rachelle Hora is a 81 y.o. male with a h/o long standing persistent atrial fibrillation who presents for f/u in the [facility].  The patient is aphasic due to previous stroke and is here with his daughter. He is off chronotropic drugs due to bradycardia and appears to be asymptomatic with bradycardia. EKG shows Afib at 53 bpm.Taking eliquis without bleeding issues with a chadsvasc of at least 6.  Today, he denies symptoms of palpitations, chest pain, shortness of breath, orthopnea, PND,  dizziness, presyncope, syncope, snoring, daytime somnolence, bleeding, or neurologic sequela.  Mild LLE rt leg,  site of previous vein removal for bypass.The patient is tolerating medications without difficulties and is otherwise without complaint today.    Past Medical History  Diagnosis Date  . GERD (gastroesophageal reflux disease)   . Hyperlipidemia   . Hypertension   . CAD (coronary artery disease)     CABG x5  . Rosacea   . Persistent atrial fibrillation   . Actinic keratosis   . BPH with urinary obstruction   . Iron deficiency anemia secondary to blood loss (chronic)   . Situational depression     Severe  . History of hydronephrosis   . Cerebrovascular accident (stroke)     With right hemiparesis and persistent expressive aphasia  . Bradycardia   . Pulmonary hypertension     by echo 2013  . Tricuspid regurgitation   . Iron deficiency anemia due to chronic blood loss 03/26/2007    Now resolved    . Hx of hydronephrosis 07/08/2011    right    Past Surgical History  Procedure Laterality Date  . Coronary artery bypass graft      x 5  . Tonsillectomy    . Band hemorrhoidectomy    . Dental implants    . Abdominal surgery    . Back surgery    . Hernia repair    . Kidney surgery     stenosis with stent  . Cholecystectomy      @ENCMED @  Allergies  Allergen Reactions  . Ciprofloxacin Hcl     unknown  . Sulfamethoxazole-Trimethoprim     unknown    Social History   Social History  . Marital Status: Married    Spouse Name: N/A  . Number of Children: N/A  . Years of Education: N/A   Occupational History  . Not on file.   Social History Main Topics  . Smoking status: Former Research scientist (life sciences)  . Smokeless tobacco: Not on file  . Alcohol Use: No  . Drug Use: No  . Sexual Activity: Not Currently   Other Topics Concern  . Not on file   Social History Narrative   Lives in [place] with spouse. LIves at a house at a retirement home. 3 children. 6 grandkids. 5 greatgrandkids.       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 Heart attack      fhx  . Coronary artery disease      fhx  . Heart disease Father    The patient does not have a history of early familial atrial fibrillation or other arrhythmias.  ROS- All  systems are reviewed and negative except as per the HPI above.  Physical Exam: Filed Vitals:   10/09/14 1126 10/09/14 1232  BP: 158/78 120/70  Pulse: 61   Height: 6\' 1"  (1.854 m)   Weight: 198 lb 9.6 oz (90.084 kg)     GEN- The patient is well appearing, alert and oriented x 3 today.   Head- normocephalic, atraumatic Eyes-  Sclera clear, conjunctiva pink Ears- hearing intact Oropharynx- clear Neck- supple, no JVP Lymph- no cervical lymphadenopathy Lungs- Clear to ausculation bilaterally, normal work of breathing Heart- Irregular rate and rhythm, no murmurs, rubs or gallops, PMI not laterally displaced GI- soft, NT, ND, + BS Extremities- no clubbing, cyanosis, or edema MS- no significant deformity or atrophy Skin- no rash or lesion Psych- euthymic mood, full affect Neuro- strength and sensation are intact  EKG afib @ 61 bpm, LAD,  IRBBB   BP Readings from Last 3 Encounters:  10/09/14 120/70  09/01/14 132/66  07/21/14 160/70   Wt Readings from Last 3 Encounters:  10/09/14 198 lb 9.6 oz (90.084 kg)  09/01/14 197 lb (89.359 kg)  07/21/14 199 lb (90.266 kg)   He has a BMI of Body mass index is 26.21 kg/(m^2)..  CBC Latest Ref Rng 10/09/2014 10/10/2013 08/11/2013  WBC 4.0 - 10.5 K/uL 5.1 5.7 6.1  Hemoglobin 13.0 - 17.0 g/dL 13.5 13.8 13.7  Hematocrit 39.0 - 52.0 % 39.3 41.6 39.9  Platelets 150 - 400 K/uL 142(L) 139.0(L) 131.0(L)   BMP Latest Ref Rng 10/09/2014 08/11/2013 07/10/2012  Glucose 65 - 99 mg/dL 105(H) 113(H) 135(H)  BUN 6 - 20 mg/dL 23(H) 21 22  Creatinine 0.61 - 1.24 mg/dL 1.32(H) 1.3 1.3  Sodium 135 - 145 mmol/L 137 133(L) 136  Potassium 3.5 - 5.1 mmol/L 5.1 4.9 4.2  Chloride 101 - 111 mmol/L 98(L) 94(L) 98  CO2 22 - 32 mmol/L 32 31 31  Calcium 8.9 - 10.3 mg/dL 9.2 9.4 9.2    Lipid Panel     Component Value Date/Time   CHOL 158 08/11/2013 0952   TRIG 52.0 08/11/2013 0952   TRIG 41 12/29/2005 0930   HDL 75.20 08/11/2013 0952   CHOLHDL 2 08/11/2013 0952   CHOLHDL 2.7 CALC 12/29/2005 0930   VLDL 10.4 08/11/2013 0952   LDLCALC 72 08/11/2013 0952   LDLDIRECT 51.5 10/07/2012 1521     [facility] records are reviewed  today.  Assessment and Plan:  1. Atrial fibrillation The patient has asymptomatic longstanding persistent atrial fibrillation with controlled ventricular response.Marland Kitchen  He  is  appropriately anticoagulated at this time. The patient is adequately rate controlled without any drugs. Will need to follow closely for symptoms related to bradycardia and/or need for PPM. Will need cbc, bmet, TSH today  2. HTN Stable  3. Previous CVA with aphasia Continue Eliquis with chadsvasc of at least 6. Steady on feet, no falls.  F/u in 3-4 months.       Roderic Palau NP  Nurse Practitioner, [facility] 10/09/2014 12:33 PM

## 2014-10-09 NOTE — Patient Instructions (Signed)
Parking code for December [id number]

## 2014-10-21 ENCOUNTER — Telehealth: Payer: Self-pay | Admitting: Family Medicine

## 2014-10-21 NOTE — Telephone Encounter (Signed)
Line busy

## 2014-10-21 NOTE — Telephone Encounter (Signed)
Pt has rash on back and something going on w/eyes. Can I use sda slot?

## 2014-10-21 NOTE — Telephone Encounter (Signed)
That is fine 

## 2014-10-21 NOTE — Telephone Encounter (Signed)
Line busy will try before leave work today

## 2014-10-22 ENCOUNTER — Ambulatory Visit (INDEPENDENT_AMBULATORY_CARE_PROVIDER_SITE_OTHER): Payer: Medicare Other | Admitting: Family Medicine

## 2014-10-22 ENCOUNTER — Encounter: Payer: Self-pay | Admitting: Family Medicine

## 2014-10-22 VITALS — BP 130/80 | HR 72 | Temp 98.5°F | Wt 200.0 lb

## 2014-10-22 DIAGNOSIS — L570 Actinic keratosis: Secondary | ICD-10-CM

## 2014-10-22 DIAGNOSIS — L989 Disorder of the skin and subcutaneous tissue, unspecified: Secondary | ICD-10-CM

## 2014-10-22 NOTE — Progress Notes (Signed)
[name] Reddish, MD  Subjective:  [name] is a 81 y.o. year old very pleasant male patient who presents with:  Skin lesions -Has 3 areas he is worried about. 1 on right cheek. 1 on left flank, and 1 medial to right eye. Are right cheek present for a month not growing in size. Area left flank has red base and several small firm projections almost horned like. Area near right eye has been there 3-4 weeks and has grown to 1/2 cm. Not affecting vision  ROS- no blurry vision, no double vision, no fever, chills, expanding redness  Past Medical History- memory loss on namenda, hemiparesis after CVA, chronic a fib on eliquis, CAD, depression, HLD, HTN, BPH  Medications- reviewed and updated Current Outpatient Prescriptions  Medication Sig Dispense Refill  . amLODipine (NORVASC) 2.5 MG tablet Take 1 tablet (2.5 mg total) by mouth daily. 30 tablet 5  . ELIQUIS 5 MG TABS tablet TAKE 1 TABLET BY MOUTH TWICE DAILY 60 tablet 3  . escitalopram (LEXAPRO) 20 MG tablet Take 1 tablet (20 mg total) by mouth daily. 90 tablet 1  . memantine (NAMENDA) 10 MG tablet TAKE 1 TABLET BY MOUTH EVERY DAY 30 tablet 5  . Multiple Vitamin (MULTIVITAMIN) tablet Take 1 tablet by mouth daily.      . rosuvastatin (CRESTOR) 20 MG tablet Take 1 tablet (20 mg total) by mouth daily. 30 tablet 3  . tamsulosin (FLOMAX) 0.4 MG CAPS capsule Take 0.4 mg by mouth at bedtime.  1  . triamterene-hydrochlorothiazide (MAXZIDE-25) 37.5-25 MG per tablet TAKE 1 TABLET BY MOUTH EVERY MORNING 30 tablet 6   Objective: BP 130/80 mmHg  Pulse 72  Temp(Src) 98.5 F (36.9 C)  Wt 200 lb (90.719 kg) Gen: NAD, resting comfortably CV: irregularly irregular no murmurs rubs or gallops Lungs: CTAB no crackles, wheeze, rhonchi Abdomen: soft/nontender/nondistended/normal bowel sounds. No rebound or guarding.  Ext: no edema Skin:  Right cheek with 2-3 mm lesion with erythematous base and scale on top. Similar but larger lesion on left flank but  nearly 1 cm in size with scale much thicker with some extension almost horned like.  Near left eye has 1/2 cm growth medial to left eye.   Assessment/Plan:  Actinic keratosis S: 2 lesions found (right cheek and left flank) A/P: cryotherapy completed on both. One on left flank almost a cm in size and with almost horned appearance- I am concerned that this may not respond to cryotherapy and I would ask derm to reevaluate when they see patient in next few weeks.   Lesion of skin of face - I am unclear what cause of this lesion is- possible wart. I was hesitant to use cryotherapy given proximity to eye though. It has grown rather quickly and I am concerned for continued growth, will get derm's opinion and they could consider removal methods if needed. Plan: Ambulatory referral to Dermatology  Return precautions advised. Aftercare discussed  Orders Placed This Encounter  Procedures  . Ambulatory referral to Dermatology    Referral Priority:  Routine    Referral Type:  Consultation    Referral Reason:  Specialty Services Required    Requested Specialty:  Dermatology    Number of Visits Requested:  1    No orders of the defined types were placed in this encounter.

## 2014-10-22 NOTE — Patient Instructions (Signed)
Medication Instructions:  Same, see list  Other Instructions:  Areas sprayed today will likely blister up at some point, can use vaseline and a bandaid if needed. Ok to wash areas. Try not to scrub heavily  We will call you within a week about your referral to dermatology. If you do not hear within 10 days, give [place] a call.

## 2014-10-22 NOTE — Telephone Encounter (Signed)
Pt has been sch and I called daughter to sch the appt. Pt line still busy

## 2014-10-22 NOTE — Assessment & Plan Note (Signed)
S: 2 lesions found (right cheek and left flank) A/P: cryotherapy completed on both. One on left flank almost a cm in size and with almost horned appearance- I am concerned that this may not respond to cryotherapy and I would ask derm to reevaluate when they see patient in next few weeks.

## 2014-11-05 ENCOUNTER — Other Ambulatory Visit: Payer: Self-pay

## 2014-11-29 ENCOUNTER — Other Ambulatory Visit: Payer: Self-pay | Admitting: Family Medicine

## 2014-12-03 ENCOUNTER — Other Ambulatory Visit: Payer: Self-pay | Admitting: Internal Medicine

## 2015-02-05 ENCOUNTER — Other Ambulatory Visit: Payer: Self-pay | Admitting: Internal Medicine

## 2015-02-05 ENCOUNTER — Ambulatory Visit (HOSPITAL_COMMUNITY)
Admission: RE | Admit: 2015-02-05 | Discharge: 2015-02-05 | Disposition: A | Discharge status: Home / Self Care | Payer: Medicare Other | Source: Ambulatory Visit | Attending: Nurse Practitioner | Admitting: Nurse Practitioner

## 2015-02-05 VITALS — BP 144/82 | HR 65 | Ht 72.0 in | Wt 196.4 lb

## 2015-02-05 DIAGNOSIS — I69920 Aphasia following unspecified cerebrovascular disease: Secondary | ICD-10-CM | POA: Insufficient documentation

## 2015-02-05 DIAGNOSIS — I482 Chronic atrial fibrillation, unspecified: Secondary | ICD-10-CM

## 2015-02-05 DIAGNOSIS — I10 Essential (primary) hypertension: Secondary | ICD-10-CM | POA: Diagnosis not present

## 2015-02-05 NOTE — Progress Notes (Addendum)
[name], male   DOB: [birth year], 81 y.o.   MRN: [id number]       Primary Care Physician: [name], MD Primary Electrophysiologist: Dr. Rachelle Hora is a 81 y.o. male with a h/o long standing persistent atrial fibrillation who presents for f/u in the [facility].  The patient is aphasic due to previous stroke and is here with his daughter. He is off chronotropic drugs due to bradycardia and appears to be asymptomatic with v rate at 65 bpm. .Taking eliquis without bleeding issues with a chadsvasc of at least 6.  Today, he denies symptoms of palpitations, chest pain, shortness of breath, orthopnea, PND,  dizziness, presyncope, syncope, snoring, daytime somnolence, bleeding, or neurologic sequela.  Mild LLE rt leg,site of previous vein removal for bypass.The patient is tolerating medications without difficulties and is otherwise without complaint today.    Past Medical History  Diagnosis Date  . GERD (gastroesophageal reflux disease)   . Hyperlipidemia   . Hypertension   . CAD (coronary artery disease)     CABG x5  . Rosacea   . Persistent atrial fibrillation   . Actinic keratosis   . BPH with urinary obstruction   . Iron deficiency anemia secondary to blood loss (chronic)   . Situational depression     Severe  . History of hydronephrosis   . Cerebrovascular accident (stroke)     With right hemiparesis and persistent expressive aphasia  . Bradycardia   . Pulmonary hypertension     by echo 2013  . Tricuspid regurgitation   . Iron deficiency anemia due to chronic blood loss 03/26/2007    Now resolved    . Hx of hydronephrosis 07/08/2011    right    Past Surgical History  Procedure Laterality Date  . Coronary artery bypass graft      x 5  . Tonsillectomy    . Band hemorrhoidectomy    . Dental implants    . Abdominal surgery    . Back surgery    . Hernia repair    . Kidney surgery      stenosis with stent  .  Cholecystectomy      @ENCMED @  Allergies  Allergen Reactions  . Ciprofloxacin Hcl     unknown  . Sulfamethoxazole-Trimethoprim     unknown    Social History   Social History  . Marital Status: Married    Spouse Name: N/A  . Number of Children: N/A  . Years of Education: N/A   Occupational History  . Not on file.   Social History Main Topics  . Smoking status: Former Research scientist (life sciences)  . Smokeless tobacco: Not on file  . Alcohol Use: No  . Drug Use: No  . Sexual Activity: Not Currently   Other Topics Concern  . Not on file   Social History Narrative   Lives in [place] with spouse. LIves at a house at a retirement home. 3 children. 6 grandkids. 5 greatgrandkids.       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 Heart attack      fhx  . Coronary artery disease      fhx  . Heart disease Father    The patient does not have a history of early familial atrial fibrillation or other arrhythmias.  ROS- All systems  are reviewed and negative except as per the HPI above.  Physical Exam: Filed Vitals:   02/05/15 1140  BP: 144/82  Pulse: 65  Height: 6' (1.829 m)  Weight: 196 lb 6.4 oz (89.086 kg)    GEN- The patient is well appearing, alert and oriented x 3 today.   Head- normocephalic, atraumatic Eyes-  Sclera clear, conjunctiva pink Ears- hearing intact Oropharynx- clear Neck- supple, no JVP Lymph- no cervical lymphadenopathy Lungs- Clear to ausculation bilaterally, normal work of breathing Heart- Irregular rate and rhythm, no murmurs, rubs or gallops, PMI not laterally displaced GI- soft, NT, ND, + BS Extremities- no clubbing, cyanosis, or edema MS- no significant deformity or atrophy Skin- no rash or lesion Psych- euthymic mood, full affect Neuro- strength and sensation are intact  EKG afib @ 65 bpm, LAD, IRBBB, qrs int 158ms, qtc, 440 ms,   BP Readings from  Last 3 Encounters:  02/05/15 144/82  10/22/14 130/80  10/09/14 120/70   Wt Readings from Last 3 Encounters:  02/05/15 196 lb 6.4 oz (89.086 kg)  10/22/14 200 lb (90.719 kg)  10/09/14 198 lb 9.6 oz (90.084 kg)   He has a BMI of Body mass index is 26.63 kg/(m^2)..  CBC Latest Ref Rng 10/09/2014 10/10/2013 08/11/2013  WBC 4.0 - 10.5 K/uL 5.1 5.7 6.1  Hemoglobin 13.0 - 17.0 g/dL 13.5 13.8 13.7  Hematocrit 39.0 - 52.0 % 39.3 41.6 39.9  Platelets 150 - 400 K/uL 142(L) 139.0(L) 131.0(L)   BMP Latest Ref Rng 10/09/2014 08/11/2013 07/10/2012  Glucose 65 - 99 mg/dL 105(H) 113(H) 135(H)  BUN 6 - 20 mg/dL 23(H) 21 22  Creatinine 0.61 - 1.24 mg/dL 1.32(H) 1.3 1.3  Sodium 135 - 145 mmol/L 137 133(L) 136  Potassium 3.5 - 5.1 mmol/L 5.1 4.9 4.2  Chloride 101 - 111 mmol/L 98(L) 94(L) 98  CO2 22 - 32 mmol/L 32 31 31  Calcium 8.9 - 10.3 mg/dL 9.2 9.4 9.2    Lipid Panel     Component Value Date/Time   CHOL 158 08/11/2013 0952   TRIG 52.0 08/11/2013 0952   TRIG 41 12/29/2005 0930   HDL 75.20 08/11/2013 0952   CHOLHDL 2 08/11/2013 0952   CHOLHDL 2.7 CALC 12/29/2005 0930   VLDL 10.4 08/11/2013 0952   LDLCALC 72 08/11/2013 0952   LDLDIRECT 51.5 10/07/2012 1521     [facility] records are reviewed  today.  Assessment and Plan:  1. Atrial fibrillation The patient has asymptomatic longstanding persistent atrial fibrillation with controlled ventricular response.Marland Kitchen  He  is  appropriately anticoagulated at this time. The patient is adequately rate controlled without any drugs. Will need to follow closely for symptoms related to bradycardia and/or need for PPM.   2. HTN Stable  3. Previous CVA with aphasia Continue Eliquis with chadsvasc of at least 6. Steady on feet, no falls.  F/u in 6 months  Geroge Baseman. [name], [facility] [address] [place], [place] [address] [phone]

## 2015-02-18 ENCOUNTER — Other Ambulatory Visit: Payer: Self-pay | Admitting: Family Medicine

## 2015-02-23 ENCOUNTER — Telehealth: Payer: Self-pay | Admitting: Family Medicine

## 2015-02-23 ENCOUNTER — Other Ambulatory Visit: Payer: Self-pay | Admitting: Family Medicine

## 2015-02-23 NOTE — Telephone Encounter (Signed)
Refill has been ready for pt according to pharmacy.

## 2015-02-23 NOTE — Telephone Encounter (Signed)
Pt is still waiting of generic crestor  20 mg#30 w/refills to be sent to pharm [facility]

## 2015-03-10 ENCOUNTER — Other Ambulatory Visit: Payer: Self-pay | Admitting: Family Medicine

## 2015-03-11 ENCOUNTER — Other Ambulatory Visit: Payer: Self-pay | Admitting: Family Medicine

## 2015-04-20 ENCOUNTER — Other Ambulatory Visit: Payer: Self-pay | Admitting: Family Medicine

## 2015-05-14 ENCOUNTER — Other Ambulatory Visit: Payer: Self-pay | Admitting: Family Medicine

## 2015-07-05 ENCOUNTER — Other Ambulatory Visit: Payer: Self-pay | Admitting: Family Medicine

## 2015-07-06 DIAGNOSIS — H6123 Impacted cerumen, bilateral: Secondary | ICD-10-CM | POA: Diagnosis not present

## 2015-07-06 DIAGNOSIS — H903 Sensorineural hearing loss, bilateral: Secondary | ICD-10-CM | POA: Diagnosis not present

## 2015-07-06 DIAGNOSIS — I69320 Aphasia following cerebral infarction: Secondary | ICD-10-CM | POA: Diagnosis not present

## 2015-07-06 DIAGNOSIS — Z974 Presence of external hearing-aid: Secondary | ICD-10-CM | POA: Diagnosis not present

## 2015-07-06 DIAGNOSIS — H6062 Unspecified chronic otitis externa, left ear: Secondary | ICD-10-CM | POA: Diagnosis not present

## 2015-08-01 ENCOUNTER — Other Ambulatory Visit: Payer: Self-pay | Admitting: Family Medicine

## 2015-08-04 ENCOUNTER — Other Ambulatory Visit: Payer: Self-pay | Admitting: Family Medicine

## 2015-08-04 DIAGNOSIS — H903 Sensorineural hearing loss, bilateral: Secondary | ICD-10-CM | POA: Diagnosis not present

## 2015-08-05 ENCOUNTER — Other Ambulatory Visit: Payer: Self-pay | Admitting: Family Medicine

## 2015-08-07 ENCOUNTER — Other Ambulatory Visit: Payer: Self-pay | Admitting: Family Medicine

## 2015-09-02 ENCOUNTER — Other Ambulatory Visit: Payer: Self-pay | Admitting: *Deleted

## 2015-09-02 ENCOUNTER — Telehealth: Payer: Self-pay | Admitting: Family Medicine

## 2015-09-02 MED ORDER — AMLODIPINE BESYLATE 2.5 MG PO TABS: ORAL_TABLET | ORAL | Status: DC

## 2015-09-02 NOTE — Telephone Encounter (Signed)
Pt need new Rx for amlodipine   Pharm:  [facility]

## 2015-09-06 ENCOUNTER — Other Ambulatory Visit: Payer: Self-pay | Admitting: Family Medicine

## 2015-09-06 ENCOUNTER — Other Ambulatory Visit: Payer: Self-pay | Admitting: Internal Medicine

## 2015-09-07 ENCOUNTER — Encounter (HOSPITAL_COMMUNITY): Payer: Self-pay | Admitting: Nurse Practitioner

## 2015-09-07 ENCOUNTER — Ambulatory Visit (HOSPITAL_COMMUNITY)
Admission: RE | Admit: 2015-09-07 | Discharge: 2015-09-07 | Disposition: A | Discharge status: Home / Self Care | Payer: Medicare Other | Source: Ambulatory Visit | Attending: Nurse Practitioner | Admitting: Nurse Practitioner

## 2015-09-07 VITALS — BP 140/62 | HR 52 | Ht 72.0 in | Wt 198.8 lb

## 2015-09-07 DIAGNOSIS — Z882 Allergy status to sulfonamides status: Secondary | ICD-10-CM | POA: Diagnosis not present

## 2015-09-07 DIAGNOSIS — I10 Essential (primary) hypertension: Secondary | ICD-10-CM | POA: Insufficient documentation

## 2015-09-07 DIAGNOSIS — I2510 Atherosclerotic heart disease of native coronary artery without angina pectoris: Secondary | ICD-10-CM | POA: Insufficient documentation

## 2015-09-07 DIAGNOSIS — Z881 Allergy status to other antibiotic agents status: Secondary | ICD-10-CM | POA: Diagnosis not present

## 2015-09-07 DIAGNOSIS — I482 Chronic atrial fibrillation, unspecified: Secondary | ICD-10-CM

## 2015-09-07 DIAGNOSIS — Z7901 Long term (current) use of anticoagulants: Secondary | ICD-10-CM | POA: Diagnosis not present

## 2015-09-07 DIAGNOSIS — K219 Gastro-esophageal reflux disease without esophagitis: Secondary | ICD-10-CM | POA: Diagnosis not present

## 2015-09-07 DIAGNOSIS — Z951 Presence of aortocoronary bypass graft: Secondary | ICD-10-CM | POA: Insufficient documentation

## 2015-09-07 DIAGNOSIS — E785 Hyperlipidemia, unspecified: Secondary | ICD-10-CM | POA: Insufficient documentation

## 2015-09-07 DIAGNOSIS — I69320 Aphasia following cerebral infarction: Secondary | ICD-10-CM | POA: Insufficient documentation

## 2015-09-07 DIAGNOSIS — I481 Persistent atrial fibrillation: Secondary | ICD-10-CM | POA: Insufficient documentation

## 2015-09-07 DIAGNOSIS — Z87891 Personal history of nicotine dependence: Secondary | ICD-10-CM | POA: Insufficient documentation

## 2015-09-07 LAB — BASIC METABOLIC PANEL
ANION GAP: 7 (ref 5–15)
BUN: 26 mg/dL — AB (ref 6–20)
CHLORIDE: 95 mmol/L — AB (ref 101–111)
CO2: 31 mmol/L (ref 22–32)
Calcium: 9.2 mg/dL (ref 8.9–10.3)
Creatinine, Ser: 1.45 mg/dL — ABNORMAL HIGH (ref 0.61–1.24)
GFR calc Af Amer: 49 mL/min — ABNORMAL LOW (ref >60)
GFR, EST NON AFRICAN AMERICAN: 43 mL/min — AB (ref >60)
GLUCOSE: 104 mg/dL — AB (ref 65–99)
POTASSIUM: 3.7 mmol/L (ref 3.5–5.1)
Sodium: 133 mmol/L — ABNORMAL LOW (ref 135–145)

## 2015-09-07 LAB — CBC
HCT: 39.3 % (ref 39.0–52.0)
HEMOGLOBIN: 13.5 g/dL (ref 13.0–17.0)
MCH: 31.8 pg (ref 26.0–34.0)
MCHC: 34.4 g/dL (ref 30.0–36.0)
MCV: 92.5 fL (ref 78.0–100.0)
Platelets: 147 10*3/uL — ABNORMAL LOW (ref 150–400)
RBC: 4.25 MIL/uL (ref 4.22–5.81)
RDW: 13.7 % (ref 11.5–15.5)
WBC: 6 10*3/uL (ref 4.0–10.5)

## 2015-09-07 NOTE — Progress Notes (Signed)
[name], male   DOB: 21-Feb-1929, 82 y.o.   MRN: [id number]       Primary Care Physician: [name], MD Primary Electrophysiologist: Dr. Rachelle Hora is a 82 y.o. male with a h/o long standing persistent atrial fibrillation who presents for f/u in the [facility].  The patient is aphasic due to previous stroke and is here with his daughter. He is off chronotropic drugs due to bradycardia and appears to be asymptomatic with v rate at 65 bpm. .Taking eliquis without bleeding issues with a chadsvasc of at least 6.  Today, he denies symptoms of palpitations, chest pain, shortness of breath, orthopnea, PND,  dizziness, presyncope, syncope, snoring, daytime somnolence, bleeding, or neurologic sequela.  Mild LLE rt leg,site of previous vein removal for bypass.The patient is tolerating medications without difficulties and is otherwise without complaint today.    Past Medical History  Diagnosis Date  . GERD (gastroesophageal reflux disease)   . Hyperlipidemia   . Hypertension   . CAD (coronary artery disease)     CABG x5  . Rosacea   . Persistent atrial fibrillation   . Actinic keratosis   . BPH with urinary obstruction   . Iron deficiency anemia secondary to blood loss (chronic)   . Situational depression     Severe  . History of hydronephrosis   . Cerebrovascular accident (stroke)     With right hemiparesis and persistent expressive aphasia  . Bradycardia   . Pulmonary hypertension     by echo 2013  . Tricuspid regurgitation   . Iron deficiency anemia due to chronic blood loss 03/26/2007    Now resolved    . Hx of hydronephrosis 07/08/2011    right    Past Surgical History  Procedure Laterality Date  . Coronary artery bypass graft      x 5  . Tonsillectomy    . Band hemorrhoidectomy    . Dental implants    . Abdominal surgery    . Back surgery    . Hernia repair    . Kidney surgery      stenosis with stent  .  Cholecystectomy        Allergies  Allergen Reactions  . Ciprofloxacin Hcl     unknown  . Sulfamethoxazole-Trimethoprim     unknown    Social History   Social History  . Marital Status: Married    Spouse Name: N/A  . Number of Children: N/A  . Years of Education: N/A   Occupational History  . Not on file.   Social History Main Topics  . Smoking status: Former Research scientist (life sciences)  . Smokeless tobacco: Not on file  . Alcohol Use: No  . Drug Use: No  . Sexual Activity: Not Currently   Other Topics Concern  . Not on file   Social History Narrative   Lives in [place] with spouse. LIves at a house at a retirement home. 3 children. 6 grandkids. 5 greatgrandkids.       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 Heart attack      fhx  . Coronary artery disease      fhx  . Heart disease Father    The patient does not have a history of early familial atrial fibrillation or other arrhythmias.  ROS- All systems  are reviewed and negative except as per the HPI above.  Physical Exam: BP 140/62, HR 49 bpm,  GEN- The patient is well appearing, alert and oriented x 3 today.   Head- normocephalic, atraumatic Eyes-  Sclera clear, conjunctiva pink Ears- hearing intact Oropharynx- clear Neck- supple, no JVP Lymph- no cervical lymphadenopathy Lungs- Clear to ausculation bilaterally, normal work of breathing Heart- Irregular rate and rhythm, no murmurs, rubs or gallops, PMI not laterally displaced GI- soft, NT, ND, + BS Extremities- no clubbing, cyanosis, or edema MS- no significant deformity or atrophy Skin- no rash or lesion Psych- euthymic mood, full affect Neuro- strength and sensation are intact  EKG afib @ 53 bpm, LAD, IRBBB, qrs int 154ms, qtc, 407 ms,    Assessment and Plan:  1. Atrial fibrillation The patient has asymptomatic longstanding persistent atrial fibrillation  with controlled ventricular response.Marland Kitchen  He  is  appropriately anticoagulated at this time. The patient is adequately rate controlled without any drugs. Will need to follow closely for symptoms related to bradycardia and/or need for PPM, currently asymptomatic.  2. HTN Stable  3. Previous CVA with aphasia Continue Eliquis with chadsvasc of at least 6. Steady on feet, no falls. Bmet/cbc today  F/u in 6 months  Geroge Baseman. [place], [facility] [address] [place], [place] [address] [phone]

## 2015-09-07 NOTE — Patient Instructions (Signed)
Lab today; BMET and CBC Follow up in 6 months with afib clinic

## 2015-09-09 ENCOUNTER — Telehealth (HOSPITAL_COMMUNITY): Payer: Self-pay | Admitting: *Deleted

## 2015-09-09 NOTE — Telephone Encounter (Signed)
Dolores Lory, pts daughter rtn call; she was given lab results and a f/u appt was sched for 10/11/15 at 11:30 for repeat bmet

## 2015-09-09 NOTE — Telephone Encounter (Signed)
-----   Message from Sherran Needs, NP sent at 09/09/2015  9:26 AM EDT ----- Please redraw bmet in one month. If creat is over 1.5 will need to reduce eliquis to 2.5 mg bid.Please contact daughter since pt is aphasic

## 2015-09-23 ENCOUNTER — Ambulatory Visit (INDEPENDENT_AMBULATORY_CARE_PROVIDER_SITE_OTHER): Payer: Medicare Other | Admitting: Adult Health

## 2015-09-23 VITALS — BP 160/78 | HR 70 | Temp 98.2°F

## 2015-09-23 DIAGNOSIS — N50811 Right testicular pain: Secondary | ICD-10-CM

## 2015-09-23 LAB — POCT URINALYSIS DIPSTICK
BILIRUBIN UA: NEGATIVE
Blood, UA: NEGATIVE
Glucose, UA: NEGATIVE
Ketones, UA: NEGATIVE
LEUKOCYTES UA: NEGATIVE
NITRITE UA: NEGATIVE
Spec Grav, UA: 1.02
Urobilinogen, UA: 0.2
pH, UA: 8

## 2015-09-23 NOTE — Progress Notes (Signed)
   Subjective:    [name], male    DOB: Oct 09, 1928, 82 y.o.   MRN: [id number]  HPI 82 year old male, patient of Dr. Yong Channel who presents to the office today with four days of right groin pain. His daughter is with him at this visit due to the patient being non verbal s/p stoke. He has not been taking anything OTC for this pain.   During examination the patient pointed to his right testicle as the source of pain.    Review of Systems  Constitutional: Negative.   Genitourinary: Positive for testicular pain. Negative for decreased urine volume, difficulty urinating, discharge, dysuria, frequency, genital sores, penile pain and scrotal swelling.  Skin: Negative.   All other systems reviewed and are negative.      Objective:   Physical Exam  Constitutional: He is oriented to person, place, and time. He appears well-developed and well-nourished. No distress.  Abdominal: Hernia confirmed negative in the right inguinal area and confirmed negative in the left inguinal area.  Genitourinary: Penis normal.    Right testis shows swelling and tenderness. Right testis shows no mass. Right testis is descended. Left testis shows no mass, no swelling and no tenderness.  Neurological: He is alert and oriented to person, place, and time.  Skin: Skin is warm and dry. No rash noted. He is not diaphoretic. No erythema. No pallor.  Psychiatric: He has a normal mood and affect. His behavior is normal. Judgment and thought content normal.  Vitals reviewed.     Assessment & Plan:  1. Pain in right testicle - No torsion. Need to r/o hydrocele, epididymitis or variocele  - POCT urinalysis dipstick- Negative for infection. +3 protein - US Scrotum; Future - Tylenol or Motrin for pain relief. - Cold compresses as needed   Dorothyann Peng, NP

## 2015-09-27 ENCOUNTER — Telehealth: Payer: Self-pay | Admitting: Family Medicine

## 2015-09-27 NOTE — Telephone Encounter (Signed)
Pt saw cory on 09/23/15 the order in epic is not correct. There is a message in referral tab. Please resend

## 2015-09-28 ENCOUNTER — Other Ambulatory Visit: Payer: Self-pay | Admitting: Adult Health

## 2015-09-28 DIAGNOSIS — N50819 Testicular pain, unspecified: Secondary | ICD-10-CM

## 2015-09-28 NOTE — Telephone Encounter (Signed)
Ordered entered 

## 2015-09-28 NOTE — Telephone Encounter (Signed)
See below

## 2015-10-03 ENCOUNTER — Other Ambulatory Visit: Payer: Self-pay | Admitting: Family Medicine

## 2015-10-05 ENCOUNTER — Ambulatory Visit
Admission: RE | Admit: 2015-10-05 | Discharge: 2015-10-05 | Disposition: A | Discharge status: Home / Self Care | Payer: Medicare Other | Source: Ambulatory Visit | Attending: Adult Health | Admitting: Adult Health

## 2015-10-05 ENCOUNTER — Other Ambulatory Visit: Payer: Self-pay | Admitting: Internal Medicine

## 2015-10-05 DIAGNOSIS — N433 Hydrocele, unspecified: Secondary | ICD-10-CM | POA: Diagnosis not present

## 2015-10-05 DIAGNOSIS — N50819 Testicular pain, unspecified: Secondary | ICD-10-CM

## 2015-10-05 DIAGNOSIS — N50811 Right testicular pain: Secondary | ICD-10-CM

## 2015-10-07 ENCOUNTER — Telehealth: Payer: Self-pay | Admitting: Family Medicine

## 2015-10-07 NOTE — Telephone Encounter (Signed)
Pt daughter would like her father ultrasound results.

## 2015-10-08 NOTE — Telephone Encounter (Signed)
Pt saw Tommi Rumps and an ultrasound was ordered. Pt has not heard anything. Would like a call back asap. Marland Kitchen

## 2015-10-11 ENCOUNTER — Ambulatory Visit (HOSPITAL_COMMUNITY)
Admission: RE | Admit: 2015-10-11 | Discharge: 2015-10-11 | Disposition: A | Discharge status: Home / Self Care | Payer: Medicare Other | Source: Ambulatory Visit | Attending: Nurse Practitioner | Admitting: Nurse Practitioner

## 2015-10-11 DIAGNOSIS — Z7901 Long term (current) use of anticoagulants: Secondary | ICD-10-CM | POA: Diagnosis not present

## 2015-10-11 DIAGNOSIS — I10 Essential (primary) hypertension: Secondary | ICD-10-CM | POA: Diagnosis not present

## 2015-10-11 DIAGNOSIS — I482 Chronic atrial fibrillation: Secondary | ICD-10-CM | POA: Diagnosis not present

## 2015-10-11 DIAGNOSIS — I480 Paroxysmal atrial fibrillation: Secondary | ICD-10-CM

## 2015-10-11 LAB — BASIC METABOLIC PANEL
ANION GAP: 9 (ref 5–15)
BUN: 23 mg/dL — ABNORMAL HIGH (ref 6–20)
CALCIUM: 9.5 mg/dL (ref 8.9–10.3)
CHLORIDE: 94 mmol/L — AB (ref 101–111)
CO2: 32 mmol/L (ref 22–32)
CREATININE: 1.25 mg/dL — AB (ref 0.61–1.24)
GFR calc Af Amer: 59 mL/min — ABNORMAL LOW (ref >60)
GFR calc non Af Amer: 51 mL/min — ABNORMAL LOW (ref >60)
GLUCOSE: 103 mg/dL — AB (ref 65–99)
Potassium: 4 mmol/L (ref 3.5–5.1)
Sodium: 135 mmol/L (ref 135–145)

## 2015-10-11 MED ORDER — APIXABAN 5 MG PO TABS: 5.0000 mg | ORAL_TABLET | Freq: Two times a day (BID) | ORAL | 6 refills | Status: DC

## 2015-10-11 NOTE — Telephone Encounter (Signed)
Appears [name] talked to daughter last week- see under the ultrasound report

## 2015-11-03 ENCOUNTER — Other Ambulatory Visit: Payer: Self-pay | Admitting: Family Medicine

## 2015-11-03 NOTE — Telephone Encounter (Signed)
Rx refill sent to pharmacy. 

## 2015-11-18 ENCOUNTER — Other Ambulatory Visit: Payer: Self-pay | Admitting: Family Medicine

## 2015-11-22 ENCOUNTER — Encounter: Payer: Self-pay | Admitting: Family Medicine

## 2015-11-22 ENCOUNTER — Ambulatory Visit (INDEPENDENT_AMBULATORY_CARE_PROVIDER_SITE_OTHER): Payer: Medicare Other | Admitting: Family Medicine

## 2015-11-22 VITALS — BP 144/56 | HR 58 | Temp 98.0°F | Wt 197.8 lb

## 2015-11-22 DIAGNOSIS — Z23 Encounter for immunization: Secondary | ICD-10-CM | POA: Diagnosis not present

## 2015-11-22 DIAGNOSIS — I482 Chronic atrial fibrillation, unspecified: Secondary | ICD-10-CM

## 2015-11-22 DIAGNOSIS — N183 Chronic kidney disease, stage 3 unspecified: Secondary | ICD-10-CM | POA: Insufficient documentation

## 2015-11-22 DIAGNOSIS — I69351 Hemiplegia and hemiparesis following cerebral infarction affecting right dominant side: Secondary | ICD-10-CM | POA: Diagnosis not present

## 2015-11-22 DIAGNOSIS — I10 Essential (primary) hypertension: Secondary | ICD-10-CM

## 2015-11-22 DIAGNOSIS — N184 Chronic kidney disease, stage 4 (severe): Secondary | ICD-10-CM | POA: Insufficient documentation

## 2015-11-22 DIAGNOSIS — D696 Thrombocytopenia, unspecified: Secondary | ICD-10-CM | POA: Insufficient documentation

## 2015-11-22 MED ORDER — ROSUVASTATIN CALCIUM 20 MG PO TABS: ORAL_TABLET | ORAL | 3 refills | Status: DC

## 2015-11-22 MED ORDER — MEMANTINE HCL 10 MG PO TABS: 10.0000 mg | ORAL_TABLET | Freq: Every day | ORAL | 3 refills | Status: DC

## 2015-11-22 MED ORDER — TRIAMTERENE-HCTZ 37.5-25 MG PO TABS: 1.0000 | ORAL_TABLET | Freq: Every morning | ORAL | 3 refills | Status: DC

## 2015-11-22 MED ORDER — ESCITALOPRAM OXALATE 20 MG PO TABS: 20.0000 mg | ORAL_TABLET | Freq: Every day | ORAL | 3 refills | Status: DC

## 2015-11-22 MED ORDER — AMLODIPINE BESYLATE 2.5 MG PO TABS: 2.5000 mg | ORAL_TABLET | Freq: Every day | ORAL | 3 refills | Status: DC

## 2015-11-22 NOTE — Progress Notes (Signed)
Pre visit review using our clinic review tool, if applicable. No additional management support is needed unless otherwise documented below in the visit note. 

## 2015-11-22 NOTE — Progress Notes (Signed)
Subjective:  [name] is a 82 y.o. year old very pleasant male patient who presents for/with See problem oriented charting ROS- difficult to obtain but through head nodss no reported chest pain, shortness of breath, weight loss, worsening of memory.see any ROS included in HPI as well.   Past Medical History-  Patient Active Problem List   Diagnosis Date Noted  . Memory loss 07/21/2014    Priority: High  . Chronic atrial fibrillation ([place]) 07/08/2011    Priority: High  . Hemiparesis affecting right side as late effect of cerebrovascular accident ([place]) 03/26/2007    Priority: High  . CAD (coronary artery disease) 08/23/2006    Priority: High  . Thrombocytopenia ([place]) 11/22/2015    Priority: Medium  . CKD (chronic kidney disease), stage III 11/22/2015    Priority: Medium  . Bradycardia 07/09/2011    Priority: Medium  . Major depression in remission ([place]) 07/26/2007    Priority: Medium  . 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    Priority: Medium  . Hyperlipidemia 08/23/2006    Priority: Medium  . Essential hypertension 08/23/2006    Priority: Medium  . Actinic keratosis 07/30/2009    Priority: Low  . GERD 08/23/2006    Priority: Low    Medications- reviewed and updated Current Outpatient Prescriptions  Medication Sig Dispense Refill  . amLODipine (NORVASC) 2.5 MG tablet Take 1 tablet (2.5 mg total) by mouth daily. 90 tablet 3  . apixaban (ELIQUIS) 5 MG TABS tablet Take 1 tablet (5 mg total) by mouth 2 (two) times daily. 60 tablet 6  . escitalopram (LEXAPRO) 20 MG tablet Take 1 tablet (20 mg total) by mouth daily. 90 tablet 3  . memantine (NAMENDA) 10 MG tablet Take 1 tablet (10 mg total) by mouth daily. 90 tablet 3  . Multiple Vitamin (MULTIVITAMIN) tablet Take 1 tablet by mouth daily.      . rosuvastatin (CRESTOR) 20 MG tablet TAKE 1 TABLET(20 MG) BY MOUTH DAILY 90 tablet 3  . tamsulosin (FLOMAX) 0.4 MG CAPS capsule Take 0.4 mg by mouth at  bedtime.  1  . triamterene-hydrochlorothiazide (MAXZIDE-25) 37.5-25 MG tablet Take 1 tablet by mouth every morning. 90 tablet 3   No current facility-administered medications for this visit.     Objective: BP (!) 144/56 (BP Location: Left Arm, Patient Position: Sitting, Cuff Size: Normal)   Pulse (!) 58   Temp 98 F (36.7 C) (Oral)   Wt 197 lb 12.8 oz (89.7 kg)   SpO2 97%   BMI 26.83 kg/m  Gen: NAD, resting comfortably CV: irregularly irregular, low normal rate no murmurs rubs or gallops Lungs: CTAB no crackles, wheeze, rhonchi Abdomen: soft/nontender/nondistended/normal bowel sounds. No rebound or guarding.  Ext: no edema Skin: warm, dry Neuro: grossly normal, moves all extremitie  Assessment/Plan:  Hemiparesis affecting right side as late effect of cerebrovascular accident [facility]) S: Patient continues to have right sided hemiparesis with mainly 4/5 strength. He walks without difficulty and does not use aids to walk. Also had memory loss and garbled speech after stroke- namenda was started by Dr. Arnoldo Morale- unclear if there is a benefit here given likely vascular dementia but patient has tolerated well and has been continued A/P: continue on eliquis for stroke prevention  Chronic atrial fibrillation S: anticoagulated on eliquis 5mg  BID. In a fib today. Rate is controlled without meds A/P: continue current meds  Essential hypertension S: controlled poorly on triamterene-hctz 37.5-25mg . Ran out of amlodipine 2.5mg  BP Readings from Last 3  Encounters:  11/22/15 (!) 144/56  09/23/15 (!) 160/78  09/07/15 140/62  A/P:Continue current meds:  Restart amlodipine though and suspect will have improved control again  Thrombocytopenia (HCC) S: stable in 100s over last few checks A/P: continue to monitor  Family requests once a year with patient having cardiology visits every 6 months- agreed to this as long as follow up with me for issues or if BP not controlled at home  Orders Placed  This Encounter  Procedures  . Flu vaccine HIGH DOSE PF    Meds ordered this encounter  Medications  . amLODipine (NORVASC) 2.5 MG tablet    Sig: Take 1 tablet (2.5 mg total) by mouth daily.    Dispense:  90 tablet    Refill:  3  . escitalopram (LEXAPRO) 20 MG tablet    Sig: Take 1 tablet (20 mg total) by mouth daily.    Dispense:  90 tablet    Refill:  3  . memantine (NAMENDA) 10 MG tablet    Sig: Take 1 tablet (10 mg total) by mouth daily.    Dispense:  90 tablet    Refill:  3  . rosuvastatin (CRESTOR) 20 MG tablet    Sig: TAKE 1 TABLET(20 MG) BY MOUTH DAILY    Dispense:  90 tablet    Refill:  3  . triamterene-hydrochlorothiazide (MAXZIDE-25) 37.5-25 MG tablet    Sig: Take 1 tablet by mouth every morning.    Dispense:  90 tablet    Refill:  3    Return precautions advised.  [name], MD

## 2015-11-22 NOTE — Assessment & Plan Note (Signed)
S: anticoagulated on eliquis 5mg  BID. In a fib today. Rate is controlled without meds A/P: continue current meds

## 2015-11-22 NOTE — Assessment & Plan Note (Signed)
S: stable in 100s over last few checks A/P: continue to monitor

## 2015-11-22 NOTE — Patient Instructions (Signed)
Restart all meds. Refill provided.   Follow up in 1 year or sooner if you need [place]- can schedule this today.   Go see dermatologist Dr. Jerrell Belfast at next available to look at right hand.   Meds ordered this encounter  Medications  . amLODipine (NORVASC) 2.5 MG tablet    Sig: Take 1 tablet (2.5 mg total) by mouth daily.    Dispense:  90 tablet    Refill:  3  . escitalopram (LEXAPRO) 20 MG tablet    Sig: Take 1 tablet (20 mg total) by mouth daily.    Dispense:  90 tablet    Refill:  3  . memantine (NAMENDA) 10 MG tablet    Sig: Take 1 tablet (10 mg total) by mouth daily.    Dispense:  90 tablet    Refill:  3  . rosuvastatin (CRESTOR) 20 MG tablet    Sig: TAKE 1 TABLET(20 MG) BY MOUTH DAILY    Dispense:  90 tablet    Refill:  3  . triamterene-hydrochlorothiazide (MAXZIDE-25) 37.5-25 MG tablet    Sig: Take 1 tablet by mouth every morning.    Dispense:  90 tablet    Refill:  3

## 2015-11-22 NOTE — Assessment & Plan Note (Signed)
S: controlled poorly on triamterene-hctz 37.5-25mg . Ran out of amlodipine 2.5mg  BP Readings from Last 3 Encounters:  11/22/15 (!) 144/56  09/23/15 (!) 160/78  09/07/15 140/62  A/P:Continue current meds:  Restart amlodipine though and suspect will have improved control again

## 2015-11-22 NOTE — Assessment & Plan Note (Signed)
S: Patient continues to have right sided hemiparesis with mainly 4/5 strength. He walks without difficulty and does not use aids to walk. Also had memory loss and garbled speech after stroke- namenda was started by Dr. Arnoldo Morale- unclear if there is a benefit here given likely vascular dementia but patient has tolerated well and has been continued A/P: continue on eliquis for stroke prevention

## 2015-12-13 ENCOUNTER — Other Ambulatory Visit: Payer: Self-pay | Admitting: Dermatology

## 2015-12-13 DIAGNOSIS — C44622 Squamous cell carcinoma of skin of right upper limb, including shoulder: Secondary | ICD-10-CM | POA: Diagnosis not present

## 2015-12-13 HISTORY — DX: Squamous cell carcinoma of skin of right upper limb, including shoulder: C44.622

## 2016-03-09 ENCOUNTER — Ambulatory Visit (HOSPITAL_COMMUNITY)
Admission: RE | Admit: 2016-03-09 | Discharge: 2016-03-09 | Disposition: A | Discharge status: Home / Self Care | Payer: Medicare Other | Source: Ambulatory Visit | Attending: Nurse Practitioner | Admitting: Nurse Practitioner

## 2016-03-09 ENCOUNTER — Encounter (HOSPITAL_COMMUNITY): Payer: Self-pay | Admitting: Nurse Practitioner

## 2016-03-09 VITALS — BP 174/70 | HR 51

## 2016-03-09 DIAGNOSIS — I482 Chronic atrial fibrillation, unspecified: Secondary | ICD-10-CM

## 2016-03-09 DIAGNOSIS — I481 Persistent atrial fibrillation: Secondary | ICD-10-CM | POA: Insufficient documentation

## 2016-03-09 DIAGNOSIS — Z87891 Personal history of nicotine dependence: Secondary | ICD-10-CM | POA: Insufficient documentation

## 2016-03-09 DIAGNOSIS — Z882 Allergy status to sulfonamides status: Secondary | ICD-10-CM | POA: Insufficient documentation

## 2016-03-09 DIAGNOSIS — R001 Bradycardia, unspecified: Secondary | ICD-10-CM | POA: Diagnosis not present

## 2016-03-09 DIAGNOSIS — I69320 Aphasia following cerebral infarction: Secondary | ICD-10-CM | POA: Insufficient documentation

## 2016-03-09 DIAGNOSIS — I4891 Unspecified atrial fibrillation: Secondary | ICD-10-CM | POA: Diagnosis present

## 2016-03-09 NOTE — Progress Notes (Signed)
error 

## 2016-03-09 NOTE — Progress Notes (Signed)
[name], male   DOB: [birth year], 83 y.o.   MRN: [id number]       Primary Care Physician: [name], MD Primary Electrophysiologist: Dr. Rachelle Hora is a 83 y.o. male with a h/o long standing persistent atrial fibrillation who presents for f/u in the [facility]. The patient is aphasic due to previous stroke and is here with his daughter. He is off chronotropic drugs due to bradycardia and appears to be asymptomatic with v rate at 65 bpm. Taking eliquis without bleeding issues with a chadsvasc of at least 6.  He returns to afib clinic 03/09/16, EKG shows sinus brady at 51 with long RR intervals. He is asymptomatic with this but hard to evaluate pt due to aphasia 2/2  prior stroke. He continues with apixaban without bleeding issues.  Today, he denies symptoms of palpitations, chest pain, shortness of breath, orthopnea, PND,  dizziness, presyncope, syncope, snoring, daytime somnolence, bleeding, or neurologic sequela.  Mild LLE rt leg,site of previous vein removal for bypass.The patient is tolerating medications without difficulties and is otherwise without complaint today.    Past Medical History:  Diagnosis Date  . Actinic keratosis   . BPH with urinary obstruction   . Bradycardia   . CAD (coronary artery disease)    CABG x5  . Cerebrovascular accident (stroke) ([place])    With right hemiparesis and persistent expressive aphasia  . GERD (gastroesophageal reflux disease)   . History of hydronephrosis   . Hx of hydronephrosis 07/08/2011   right   . Hyperlipidemia   . Hypertension   . Iron deficiency anemia due to chronic blood loss 03/26/2007   Now resolved    . Iron deficiency anemia secondary to blood loss (chronic)   . Persistent atrial fibrillation ([place])   . Pulmonary hypertension    by echo 2013  . Rosacea   . Situational depression    Severe  . Tricuspid regurgitation    Past Surgical History:  Procedure  Laterality Date  . ABDOMINAL SURGERY    . BACK SURGERY    . BAND HEMORRHOIDECTOMY    . CHOLECYSTECTOMY    . CORONARY ARTERY BYPASS GRAFT     x 5  . dental implants    . HERNIA REPAIR    . KIDNEY SURGERY     stenosis with stent  . TONSILLECTOMY        Allergies  Allergen Reactions  . Ciprofloxacin Hcl     unknown  . Sulfamethoxazole-Trimethoprim     unknown    Social History   Social History  . Marital status: Married    Spouse name: N/A  . Number of children: N/A  . Years of education: N/A   Occupational History  . Not on file.   Social History Main Topics  . Smoking status: Former Research scientist (life sciences)  . Smokeless tobacco: Not on file  . Alcohol use No  . Drug use: No  . Sexual activity: Not Currently   Other Topics Concern  . Not on file   Social History Narrative   Lives in [place] with spouse. LIves at a house at a retirement home. 3 children. 6 grandkids. 5 greatgrandkids.       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 Heart attack      fhx  . Coronary artery  disease      fhx  . Heart disease Father    The patient does not have a history of early familial atrial fibrillation or other arrhythmias.  ROS- All systems are reviewed and negative except as per the HPI above.  Physical Exam: BP 140/62, HR 49 bpm,  GEN- The patient is well appearing, alert and oriented x 3 today.   Head- normocephalic, atraumatic Eyes-  Sclera clear, conjunctiva pink Ears- hearing intact Oropharynx- clear Neck- supple, no JVP Lymph- no cervical lymphadenopathy Lungs- Clear to ausculation bilaterally, normal work of breathing Heart- Irregular rate and rhythm, no murmurs, rubs or gallops, PMI not laterally displaced GI- soft, NT, ND, + BS Extremities- no clubbing, cyanosis, or trace edema on rt, 1t on left(prior cabg) MS- no significant deformity or atrophy Skin- no rash  or lesion Psych- euthymic mood, full affect Neuro- strength and sensation are intact  EKG afib @ 51 bpm, LAD, qrs int 135ms, qtc, 425 ms,long RR intervals    Assessment and Plan:  1. Atrial fibrillation The patient has asymptomatic longstanding persistent atrial fibrillation with slow ventricular response.Marland Kitchen  He  is  appropriately anticoagulated at this time. Ekg showing Long RR intervals 48 hour monitor to evaluated for need for PPM  2. Previous CVA with aphasia Continue Eliquis with chadsvasc of at least 6. Steady on feet, no falls.   F/u in 6 months or sooner depending on result of monitor  Butch Penny C. Boniface Goffe, [facility] [address] [place], [place] [address] [phone]

## 2016-03-10 NOTE — Addendum Note (Signed)
Encounter addended by: Nataley Bahri C Kemet Nijjar, NP on: 03/10/2016 11:25 AM<BR>    Actions taken: LOS modified

## 2016-03-14 ENCOUNTER — Ambulatory Visit (INDEPENDENT_AMBULATORY_CARE_PROVIDER_SITE_OTHER): Payer: Medicare Other

## 2016-03-14 ENCOUNTER — Other Ambulatory Visit (HOSPITAL_COMMUNITY): Payer: Self-pay | Admitting: Nurse Practitioner

## 2016-03-14 DIAGNOSIS — I482 Chronic atrial fibrillation, unspecified: Secondary | ICD-10-CM

## 2016-03-14 DIAGNOSIS — R001 Bradycardia, unspecified: Secondary | ICD-10-CM

## 2016-03-14 DIAGNOSIS — I480 Paroxysmal atrial fibrillation: Secondary | ICD-10-CM | POA: Diagnosis not present

## 2016-04-03 ENCOUNTER — Encounter (HOSPITAL_COMMUNITY): Payer: Self-pay | Admitting: *Deleted

## 2016-04-03 ENCOUNTER — Encounter: Payer: Self-pay | Admitting: Family Medicine

## 2016-04-03 ENCOUNTER — Ambulatory Visit (INDEPENDENT_AMBULATORY_CARE_PROVIDER_SITE_OTHER): Payer: Medicare Other | Admitting: Family Medicine

## 2016-04-03 VITALS — BP 150/70 | HR 60 | Temp 97.9°F | Ht 72.0 in | Wt 199.0 lb

## 2016-04-03 DIAGNOSIS — N433 Hydrocele, unspecified: Secondary | ICD-10-CM | POA: Diagnosis not present

## 2016-04-03 DIAGNOSIS — I10 Essential (primary) hypertension: Secondary | ICD-10-CM

## 2016-04-03 NOTE — Progress Notes (Signed)
Pre visit review using our clinic review tool, if applicable. No additional management support is needed unless otherwise documented below in the visit note. 

## 2016-04-03 NOTE — Assessment & Plan Note (Signed)
S: Patient was seen last year by [facility] and ultrasound showed hydrocele on left. Patient began to bring up swelling in right testicle last week to daughter. Due to his difficulty in speech- unable to tell what patients concern is but he is able to clearly nod that he has no pain or discomfort.  A/P: after discussion today and review of ultrasound report- patient and daughter reassured.

## 2016-04-03 NOTE — Assessment & Plan Note (Signed)
S: controlled poorly.  BP Readings from Last 3 Encounters:  04/03/16 (!) 150/70  03/09/16 (!) 174/70  11/22/15 (!) 144/56  A/P:Continue listed meds- Triamterene-hctz 37.5-25.  Amlodipine 2.5mg . It is unclear that he is taking both of these though- BP has seemed to trend up recently when in September plan was to restart amlodipine. Advised patient to bring meds with him to next visit (well daughter who will help him)

## 2016-04-03 NOTE — Patient Instructions (Signed)
Bring all meds to next visit so we can be sure on right blood pressure medicines  Hydrocele again on right side- not dangerous but can be annoying. If pain or worsening could get urology opinion  Blood pressure hair high but better than last time. Will make sure on all meds next visit

## 2016-04-03 NOTE — Progress Notes (Signed)
Subjective:  [name] is a 83 y.o. year old very pleasant male patient who presents for/with See problem oriented charting ROS- right testicular swelling per gestures. No pain. Truly due to  Difficulty with speech- level 5 caveat applies  Past Medical History-  Patient Active Problem List   Diagnosis Date Noted  . Memory loss 07/21/2014    Priority: High  . Chronic atrial fibrillation ([place]) 07/08/2011    Priority: High  . Hemiparesis affecting right side as late effect of cerebrovascular accident ([place]) 03/26/2007    Priority: High  . CAD (coronary artery disease) 08/23/2006    Priority: High  . Thrombocytopenia ([place]) 11/22/2015    Priority: Medium  . CKD (chronic kidney disease), stage III 11/22/2015    Priority: Medium  . Bradycardia 07/09/2011    Priority: Medium  . Major depression in remission ([place]) 07/26/2007    Priority: Medium  . 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    Priority: Medium  . Hyperlipidemia 08/23/2006    Priority: Medium  . Essential hypertension 08/23/2006    Priority: Medium  . Actinic keratosis 07/30/2009    Priority: Low  . GERD 08/23/2006    Priority: Low  . Right hydrocele 04/03/2016    Medications- reviewed and updated Current Outpatient Prescriptions  Medication Sig Dispense Refill  . amLODipine (NORVASC) 2.5 MG tablet Take 1 tablet (2.5 mg total) by mouth daily. 90 tablet 3  . apixaban (ELIQUIS) 5 MG TABS tablet Take 1 tablet (5 mg total) by mouth 2 (two) times daily. 60 tablet 6  . escitalopram (LEXAPRO) 20 MG tablet Take 1 tablet (20 mg total) by mouth daily. 90 tablet 3  . memantine (NAMENDA) 10 MG tablet Take 1 tablet (10 mg total) by mouth daily. 90 tablet 3  . Multiple Vitamin (MULTIVITAMIN) tablet Take 1 tablet by mouth daily.      . rosuvastatin (CRESTOR) 20 MG tablet TAKE 1 TABLET(20 MG) BY MOUTH DAILY 90 tablet 3  . tamsulosin (FLOMAX) 0.4 MG CAPS capsule Take 0.4 mg by mouth at bedtime.  1  .  triamterene-hydrochlorothiazide (MAXZIDE-25) 37.5-25 MG tablet Take 1 tablet by mouth every morning. 90 tablet 3   No current facility-administered medications for this visit.     Objective: BP (!) 150/70   Pulse 60   Temp 97.9 F (36.6 C) (Oral)   Ht 6' (1.829 m)   Wt 199 lb (90.3 kg)   SpO2 98%   BMI 26.99 kg/m  Gen: NAD, resting comfortably CV: RRR no murmurs rubs or gallops Lungs: CTAB no crackles, wheeze, rhonchi Abdomen: soft/nontender/nondistended/normal bowel sounds. No rebound or guarding.  Ext: 1+ edema Skin: warm, dry GU: swollen small water balloon like right scrotum. Normal left testicle/scrotum. Normal testicle on both.   Assessment/Plan:    Essential hypertension S: controlled poorly.  BP Readings from Last 3 Encounters:  04/03/16 (!) 150/70  03/09/16 (!) 174/70  11/22/15 (!) 144/56  A/P:Continue listed meds- Triamterene-hctz 37.5-25.  Amlodipine 2.5mg . It is unclear that he is taking both of these though- BP has seemed to trend up recently when in September plan was to restart amlodipine. Advised patient to bring meds with him to next visit (well daughter who will help him)   Right hydrocele S: Patient was seen last year by [facility] and ultrasound showed hydrocele on left. Patient began to bring up swelling in right testicle last week to daughter. Due to his difficulty in speech- unable to tell what patients concern is but he  is able to clearly nod that he has no pain or discomfort.  A/P: after discussion today and review of ultrasound report- patient and daughter reassured.   Return in about 2 months (around 06/01/2016) for follow up- recheck blood pressure.  Return precautions advised.  [name], MD

## 2016-06-01 ENCOUNTER — Encounter: Payer: Self-pay | Admitting: Family Medicine

## 2016-06-01 ENCOUNTER — Ambulatory Visit (INDEPENDENT_AMBULATORY_CARE_PROVIDER_SITE_OTHER): Payer: Medicare Other | Admitting: Family Medicine

## 2016-06-01 ENCOUNTER — Other Ambulatory Visit: Payer: Self-pay | Admitting: Family Medicine

## 2016-06-01 DIAGNOSIS — N183 Chronic kidney disease, stage 3 unspecified: Secondary | ICD-10-CM

## 2016-06-01 DIAGNOSIS — E785 Hyperlipidemia, unspecified: Secondary | ICD-10-CM | POA: Diagnosis not present

## 2016-06-01 DIAGNOSIS — F325 Major depressive disorder, single episode, in full remission: Secondary | ICD-10-CM

## 2016-06-01 DIAGNOSIS — D696 Thrombocytopenia, unspecified: Secondary | ICD-10-CM

## 2016-06-01 DIAGNOSIS — I482 Chronic atrial fibrillation, unspecified: Secondary | ICD-10-CM

## 2016-06-01 DIAGNOSIS — I69351 Hemiplegia and hemiparesis following cerebral infarction affecting right dominant side: Secondary | ICD-10-CM

## 2016-06-01 DIAGNOSIS — I10 Essential (primary) hypertension: Secondary | ICD-10-CM | POA: Diagnosis not present

## 2016-06-01 LAB — CBC WITH DIFFERENTIAL/PLATELET
BASOS PCT: 0.4 % (ref 0.0–3.0)
Basophils Absolute: 0 10*3/uL (ref 0.0–0.1)
EOS PCT: 1.9 % (ref 0.0–5.0)
Eosinophils Absolute: 0.1 10*3/uL (ref 0.0–0.7)
HEMATOCRIT: 38.9 % — AB (ref 39.0–52.0)
HEMOGLOBIN: 13.2 g/dL (ref 13.0–17.0)
LYMPHS PCT: 22.6 % (ref 12.0–46.0)
Lymphs Abs: 1.2 10*3/uL (ref 0.7–4.0)
MCHC: 34 g/dL (ref 30.0–36.0)
MCV: 95.6 fl (ref 78.0–100.0)
Monocytes Absolute: 0.8 10*3/uL (ref 0.1–1.0)
Monocytes Relative: 14.8 % — ABNORMAL HIGH (ref 3.0–12.0)
Neutro Abs: 3.3 10*3/uL (ref 1.4–7.7)
Neutrophils Relative %: 60.3 % (ref 43.0–77.0)
Platelets: 170 10*3/uL (ref 150.0–400.0)
RBC: 4.07 Mil/uL — AB (ref 4.22–5.81)
RDW: 14 % (ref 11.5–15.5)
WBC: 5.4 10*3/uL (ref 4.0–10.5)

## 2016-06-01 LAB — COMPREHENSIVE METABOLIC PANEL
ALBUMIN: 4.1 g/dL (ref 3.5–5.2)
ALK PHOS: 66 U/L (ref 39–117)
ALT: 18 U/L (ref 0–53)
AST: 17 U/L (ref 0–37)
BUN: 30 mg/dL — AB (ref 6–23)
CALCIUM: 9.4 mg/dL (ref 8.4–10.5)
CHLORIDE: 97 meq/L (ref 96–112)
CO2: 34 mEq/L — ABNORMAL HIGH (ref 19–32)
CREATININE: 1.43 mg/dL (ref 0.40–1.50)
GFR: 49.88 mL/min — ABNORMAL LOW (ref >60.00)
Glucose, Bld: 116 mg/dL — ABNORMAL HIGH (ref 70–99)
POTASSIUM: 4.6 meq/L (ref 3.5–5.1)
Sodium: 136 mEq/L (ref 135–145)
TOTAL PROTEIN: 6.8 g/dL (ref 6.0–8.3)
Total Bilirubin: 0.7 mg/dL (ref 0.2–1.2)

## 2016-06-01 LAB — LDL CHOLESTEROL, DIRECT: LDL DIRECT: 66 mg/dL

## 2016-06-01 MED ORDER — APIXABAN 5 MG PO TABS: 5.0000 mg | ORAL_TABLET | Freq: Two times a day (BID) | ORAL | 6 refills | Status: DC

## 2016-06-01 MED ORDER — TAMSULOSIN HCL 0.4 MG PO CAPS: 0.4000 mg | ORAL_CAPSULE | Freq: Every day | ORAL | 1 refills | Status: DC

## 2016-06-01 NOTE — Assessment & Plan Note (Addendum)
On crestor 20mg , update LDL and likely will not change unless LDL over 100 though ideally would be <70 with CAD history

## 2016-06-01 NOTE — Assessment & Plan Note (Signed)
S: due to garbled speech (baseline), difficult to interpret if depressed but did shake head no when asked questions A/P: compliant with lexapro- will continue

## 2016-06-01 NOTE — Patient Instructions (Signed)
Please stop by lab before you go  Blood pressure looks much better

## 2016-06-01 NOTE — Assessment & Plan Note (Signed)
Update BMET today- BP in better range today which should help prevent worsening

## 2016-06-01 NOTE — Assessment & Plan Note (Signed)
S: anticoagulated with eliquis 5 mg BID- A/P:bottle empty today and we refilled. Important to not run out

## 2016-06-01 NOTE — Progress Notes (Signed)
Subjective:  [name] is a 83 y.o. year old very pleasant male patient who presents for/with See problem oriented charting ROS- No chest pain or shortness of breath. No headache or blurry vision. Level 5 caveat technically applies though due to memory loss, garbled speech from prior stroke   Past Medical History-  Patient Active Problem List   Diagnosis Date Noted  . Memory loss 07/21/2014    Priority: High  . Chronic atrial fibrillation ([place]) 07/08/2011    Priority: High  . Hemiparesis affecting right side as late effect of cerebrovascular accident ([place]) 03/26/2007    Priority: High  . CAD (coronary artery disease) 08/23/2006    Priority: High  . Right hydrocele 04/03/2016    Priority: Medium  . Thrombocytopenia ([place]) 11/22/2015    Priority: Medium  . CKD (chronic kidney disease), stage III 11/22/2015    Priority: Medium  . Bradycardia 07/09/2011    Priority: Medium  . Major depression in remission ([place]) 07/26/2007    Priority: Medium  . 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    Priority: Medium  . Hyperlipidemia 08/23/2006    Priority: Medium  . Essential hypertension 08/23/2006    Priority: Medium  . Actinic keratosis 07/30/2009    Priority: Low  . GERD 08/23/2006    Priority: Low    Medications- reviewed and updated Current Outpatient Prescriptions  Medication Sig Dispense Refill  . amLODipine (NORVASC) 2.5 MG tablet Take 1 tablet (2.5 mg total) by mouth daily. 90 tablet 3  . apixaban (ELIQUIS) 5 MG TABS tablet Take 1 tablet (5 mg total) by mouth 2 (two) times daily. 60 tablet 6  . escitalopram (LEXAPRO) 20 MG tablet Take 1 tablet (20 mg total) by mouth daily. 90 tablet 3  . Lansoprazole (PREVACID PO) Take 15 mg by mouth daily.    . memantine (NAMENDA) 10 MG tablet Take 1 tablet (10 mg total) by mouth daily. 90 tablet 3  . Multiple Vitamin (MULTIVITAMIN) tablet Take 1 tablet by mouth daily.      . rosuvastatin (CRESTOR) 20 MG tablet TAKE 1  TABLET(20 MG) BY MOUTH DAILY 90 tablet 3  . tamsulosin (FLOMAX) 0.4 MG CAPS capsule Take 1 capsule (0.4 mg total) by mouth at bedtime. 30 capsule 1  . triamterene-hydrochlorothiazide (MAXZIDE-25) 37.5-25 MG tablet Take 1 tablet by mouth every morning. 90 tablet 3   No current facility-administered medications for this visit.     Objective: BP 128/68   Pulse (!) 57   Temp 98.6 F (37 C) (Oral)   Ht 6' (1.829 m)   Wt 201 lb 6.4 oz (91.4 kg)   SpO2 98%   BMI 27.31 kg/m  Gen: NAD, resting comfortably CV: irregularly irregular no murmurs rubs or gallops Lungs: CTAB no crackles, wheeze, rhonchi Ext: 1+ edema R >L Skin: warm, dry Neuro: 4/5 strength on right side, normal on left, walks with cane  Assessment/Plan:  Essential hypertension S: controlled on triamterine hctz 37.5-25mg  and amlodipine 2.5mg . Able to confirm today that he is in fact on amlodipine and BP looks better now BP Readings from Last 3 Encounters:  06/01/16 (!) 142/68  04/03/16 (!) 150/70  03/09/16 (!) 174/70  A/P:Continue current meds:  Doing well  Thrombocytopenia (HCC) S: very mild. Other cell lines look fine A/P: update cbc with diff today   CKD (chronic kidney disease), stage III Update BMET today- BP in better range today which should help prevent worsening  Chronic atrial fibrillation S: anticoagulated with eliquis 5  mg BID- A/P:bottle empty today and we refilled. Important to not run out    Hemiparesis affecting right side as late effect of cerebrovascular accident ([place]) 4/5 strength on right side stable. Uses cane for left side  Major depression in remission S: due to garbled speech (baseline), difficult to interpret if depressed but did shake head no when asked questions A/P: compliant with lexapro- will continue   Hyperlipidemia On crestor 20mg , update LDL and likely will not change unless LDL over 100 though ideally would be <70 with CAD history  4 months follow up  Orders Placed This  Encounter  Procedures  . CBC with Differential/Platelet  . Comprehensive metabolic panel    [place]  . LDL cholesterol, direct    [place]   Meds ordered this encounter  Medications  . Lansoprazole (PREVACID PO)    Sig: Take 15 mg by mouth daily.  . tamsulosin (FLOMAX) 0.4 MG CAPS capsule    Sig: Take 1 capsule (0.4 mg total) by mouth at bedtime.    Dispense:  30 capsule    Refill:  1  . apixaban (ELIQUIS) 5 MG TABS tablet    Sig: Take 1 tablet (5 mg total) by mouth 2 (two) times daily.    Dispense:  60 tablet    Refill:  6   Return precautions advised.  [name], MD

## 2016-06-01 NOTE — Assessment & Plan Note (Signed)
S: very mild. Other cell lines look fine A/P: update cbc with diff today

## 2016-06-01 NOTE — Progress Notes (Signed)
Pre visit review using our clinic review tool, if applicable. No additional management support is needed unless otherwise documented below in the visit note. 

## 2016-06-01 NOTE — Assessment & Plan Note (Signed)
4/5 strength on right side stable. Uses cane for left side

## 2016-06-01 NOTE — Assessment & Plan Note (Signed)
S: controlled on triamterine hctz 37.5-25mg  and amlodipine 2.5mg . Able to confirm today that he is in fact on amlodipine and BP looks better now BP Readings from Last 3 Encounters:  06/01/16 (!) 142/68  04/03/16 (!) 150/70  03/09/16 (!) 174/70  A/P:Continue current meds:  Doing well

## 2016-07-18 ENCOUNTER — Telehealth: Payer: Self-pay | Admitting: Family Medicine

## 2016-07-18 NOTE — Telephone Encounter (Signed)
Received form and filled out.  Dr. Yong Channel has signed.  Left a message on [phone] (identiified) for [name] to pick up form at the front desk.  Call back if any questions.

## 2016-07-27 ENCOUNTER — Encounter: Payer: Self-pay | Admitting: Family Medicine

## 2016-07-27 ENCOUNTER — Ambulatory Visit (INDEPENDENT_AMBULATORY_CARE_PROVIDER_SITE_OTHER): Payer: Medicare Other | Admitting: Family Medicine

## 2016-07-27 VITALS — BP 144/78 | HR 65 | Temp 98.2°F | Ht 72.0 in | Wt 194.0 lb

## 2016-07-27 DIAGNOSIS — J301 Allergic rhinitis due to pollen: Secondary | ICD-10-CM | POA: Diagnosis not present

## 2016-07-27 MED ORDER — FLUTICASONE PROPIONATE 50 MCG/ACT NA SUSP: 2.0000 | Freq: Every day | NASAL | 3 refills | Status: DC

## 2016-07-27 NOTE — Progress Notes (Signed)
PCP: [name], MD  Subjective:  [name] is a 83 y.o. year old very pleasant male patient who presents with Upper Respiratory infection symptoms including nasal congestion, sore throat, cough. Denies runny nose. Some watery itchy eyes but minimal. Minimal sneeze. Apparently had some subjective warmth at some point with current symptoms -started: 2 weeks ago, symptoms are improving slightly -previous treatments: corricidin helps some -typically does not deal with allergies but has been difficult season  ROS-denies fever, SOB, NVD, tooth pain  Pertinent Past Medical History-  Patient Active Problem List   Diagnosis Date Noted  . Memory loss 07/21/2014    Priority: High  . Chronic atrial fibrillation ([place]) 07/08/2011    Priority: High  . Hemiparesis affecting right side as late effect of cerebrovascular accident ([place]) 03/26/2007    Priority: High  . CAD (coronary artery disease) 08/23/2006    Priority: High  . Right hydrocele 04/03/2016    Priority: Medium  . Thrombocytopenia ([place]) 11/22/2015    Priority: Medium  . CKD (chronic kidney disease), stage III 11/22/2015    Priority: Medium  . Bradycardia 07/09/2011    Priority: Medium  . Major depression in remission ([place]) 07/26/2007    Priority: Medium  . 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    Priority: Medium  . Hyperlipidemia 08/23/2006    Priority: Medium  . Essential hypertension 08/23/2006    Priority: Medium  . Actinic keratosis 07/30/2009    Priority: Low  . GERD 08/23/2006    Priority: Low    Medications- reviewed  Current Outpatient Prescriptions  Medication Sig Dispense Refill  . amLODipine (NORVASC) 2.5 MG tablet Take 1 tablet (2.5 mg total) by mouth daily. 90 tablet 3  . apixaban (ELIQUIS) 5 MG TABS tablet Take 1 tablet (5 mg total) by mouth 2 (two) times daily. 60 tablet 6  . escitalopram (LEXAPRO) 20 MG tablet Take 1 tablet (20 mg total) by mouth daily. 90 tablet 3  .  Lansoprazole (PREVACID PO) Take 15 mg by mouth daily.    . memantine (NAMENDA) 10 MG tablet Take 1 tablet (10 mg total) by mouth daily. 90 tablet 3  . Multiple Vitamin (MULTIVITAMIN) tablet Take 1 tablet by mouth daily.      . rosuvastatin (CRESTOR) 20 MG tablet TAKE 1 TABLET(20 MG) BY MOUTH DAILY 90 tablet 3  . tamsulosin (FLOMAX) 0.4 MG CAPS capsule TAKE 1 CAPSULE(0.4 MG) BY MOUTH AT BEDTIME 90 capsule 1  . triamterene-hydrochlorothiazide (MAXZIDE-25) 37.5-25 MG tablet Take 1 tablet by mouth every morning. 90 tablet 3  . fluticasone (FLONASE) 50 MCG/ACT nasal spray Place 2 sprays into both nostrils daily. 16 g 3   No current facility-administered medications for this visit.     Objective: BP (!) 144/78 (BP Location: Left Arm, Patient Position: Sitting, Cuff Size: Large)   Pulse 65   Temp 98.2 F (36.8 C) (Oral)   Ht 6' (1.829 m)   Wt 194 lb (88 kg)   SpO2 97%   BMI 26.31 kg/m  Gen: NAD, resting comfortably HEENT: Turbinates pale with clear drainage, TM normal, pharynx mildly erythematous with no tonsilar exudate or edema, no sinus tenderness. Watery eyes CV: RRR no murmurs rubs or gallops Lungs: CTAB no crackles, wheeze, rhonchi Abdomen: soft/nontender/nondistended/normal bowel sounds. No rebound or guarding.  Ext: no edema Skin: warm, dry, no rash Neuro: grossly normal, moves all extremities  Assessment/Plan:  Seasonal allergic rhinitis due to pollen From avs  "Allergies that have improved some  Drainage in the throat from allergies causing sore throat- can use tylenol 500mg  every 6 hours (3x a day ) for next week  Also noted somechanges to nostrils, eyes suggestive of allergies  Trial flonase for 10 days  If not doing better, trial prednisone for 7 days likely.   If none of the above helps or if symptoms worsen particularly fever or shortness of breath--> would want to get chest x-ray immediately".   Of note- he is very well appearing, only sparing cough in room.  Lungs are clear. Sounds like may have had some more significant symptoms at one point- may have had URI but current symptoms appear to have strong allergy component. At risk for diabetes  Lab Results  Component Value Date   HGBA1C 6.2 10/07/2012  so trial flonase first.   Meds ordered this encounter  Medications  . fluticasone (FLONASE) 50 MCG/ACT nasal spray    Sig: Place 2 sprays into both nostrils daily.    Dispense:  16 g    Refill:  3    [name], MD

## 2016-07-27 NOTE — Patient Instructions (Signed)
Allergies that have improved some  Drainage in the throat from allergies causing sore throat- can use tylenol 500mg  every 6 hours (3x a day ) for next week  Also noted somechanges to nostrils, eyes suggestive of allergies  Trial flonase for 10 days  If not doing better, trial prednisone for 7 days likely.   If none of the above helps or if symptoms worsen particularly fever or shortness of breath--> would want to get chest x-ray immediatley

## 2016-08-07 ENCOUNTER — Other Ambulatory Visit: Payer: Self-pay | Admitting: Family Medicine

## 2016-08-16 ENCOUNTER — Other Ambulatory Visit: Payer: Self-pay | Admitting: Dermatology

## 2016-08-16 DIAGNOSIS — D492 Neoplasm of unspecified behavior of bone, soft tissue, and skin: Secondary | ICD-10-CM | POA: Diagnosis not present

## 2016-08-16 DIAGNOSIS — L570 Actinic keratosis: Secondary | ICD-10-CM | POA: Diagnosis not present

## 2016-08-16 DIAGNOSIS — D0461 Carcinoma in situ of skin of right upper limb, including shoulder: Secondary | ICD-10-CM | POA: Diagnosis not present

## 2016-08-16 HISTORY — DX: Carcinoma in situ of skin of right upper limb, including shoulder: D04.61

## 2016-08-24 ENCOUNTER — Ambulatory Visit (INDEPENDENT_AMBULATORY_CARE_PROVIDER_SITE_OTHER): Payer: Medicare Other | Admitting: Family Medicine

## 2016-08-24 ENCOUNTER — Encounter: Payer: Self-pay | Admitting: Family Medicine

## 2016-08-24 VITALS — BP 136/70 | HR 60 | Temp 98.7°F | Ht 72.0 in | Wt 201.4 lb

## 2016-08-24 DIAGNOSIS — L03115 Cellulitis of right lower limb: Secondary | ICD-10-CM

## 2016-08-24 MED ORDER — CEPHALEXIN 500 MG PO CAPS: 500.0000 mg | ORAL_CAPSULE | Freq: Two times a day (BID) | ORAL | 0 refills | Status: AC

## 2016-08-24 NOTE — Progress Notes (Signed)
Subjective:  [name] is a 83 y.o. year old very pleasant male patient who presents for/with See problem oriented charting ROS- no fever, chills, nausea, vomiting. No pain in leg.    Past Medical History-  Patient Active Problem List   Diagnosis Date Noted  . Memory loss 07/21/2014    Priority: High  . Chronic atrial fibrillation ([place]) 07/08/2011    Priority: High  . Hemiparesis affecting right side as late effect of cerebrovascular accident ([place]) 03/26/2007    Priority: High  . CAD (coronary artery disease) 08/23/2006    Priority: High  . Right hydrocele 04/03/2016    Priority: Medium  . Thrombocytopenia ([place]) 11/22/2015    Priority: Medium  . CKD (chronic kidney disease), stage III 11/22/2015    Priority: Medium  . Bradycardia 07/09/2011    Priority: Medium  . Major depression in remission ([place]) 07/26/2007    Priority: Medium  . 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    Priority: Medium  . Hyperlipidemia 08/23/2006    Priority: Medium  . Essential hypertension 08/23/2006    Priority: Medium  . Actinic keratosis 07/30/2009    Priority: Low  . GERD 08/23/2006    Priority: Low    Medications- reviewed and updated Current Outpatient Prescriptions  Medication Sig Dispense Refill  . amLODipine (NORVASC) 2.5 MG tablet Take 1 tablet (2.5 mg total) by mouth daily. 90 tablet 3  . apixaban (ELIQUIS) 5 MG TABS tablet Take 1 tablet (5 mg total) by mouth 2 (two) times daily. 60 tablet 6  . escitalopram (LEXAPRO) 20 MG tablet Take 1 tablet (20 mg total) by mouth daily. 90 tablet 3  . fluticasone (FLONASE) 50 MCG/ACT nasal spray Place 2 sprays into both nostrils daily. 16 g 3  . Lansoprazole (PREVACID PO) Take 15 mg by mouth daily.    . memantine (NAMENDA) 10 MG tablet Take 1 tablet (10 mg total) by mouth daily. 90 tablet 3  . Multiple Vitamin (MULTIVITAMIN) tablet Take 1 tablet by mouth daily.      . rosuvastatin (CRESTOR) 20 MG tablet TAKE 1 TABLET(20  MG) BY MOUTH DAILY 90 tablet 3  . tamsulosin (FLOMAX) 0.4 MG CAPS capsule TAKE 1 CAPSULE(0.4 MG) BY MOUTH AT BEDTIME 90 capsule 1  . tamsulosin (FLOMAX) 0.4 MG CAPS capsule TAKE 1 CAPSULE(0.4 MG) BY MOUTH AT BEDTIME 30 capsule 2  . triamterene-hydrochlorothiazide (MAXZIDE-25) 37.5-25 MG tablet Take 1 tablet by mouth every morning. 90 tablet 3   No current facility-administered medications for this visit.     Objective: BP 136/70 (BP Location: Left Arm, Patient Position: Sitting, Cuff Size: Large)   Pulse 60   Temp 98.7 F (37.1 C) (Oral)   Ht 6' (1.829 m)   Wt 201 lb 6.4 oz (91.4 kg)   SpO2 97%   BMI 27.31 kg/m  Gen: NAD, resting comfortably CV: RRR no murmurs rubs or gallops Lungs: CTAB no crackles, wheeze, rhonchi Abdomen: soft/nontender/nondistended/normal bowel sounds. No rebound or guarding.  Ext: 1+ edema on left, 2+ on right.  Skin: warm, dry, over right tibia there is an erythematous wound that oozes clear fluid. Surrounding this is warmth relative to opposite leg and mild erythema (though does not show well in photo) though not clearly demarcated Neuro: garbled speech as per baselien *    Assessment/Plan:  Cellulitis of right lower extremity S: patient has had a sore on his right leg for 10 days- hit it on something at his house. It is oozing clear  fluid. Wife called daughter yesterday reporting let getting bigger and oozing more fluid. Leg is slightly red, swollen. Not very tender.  A/P: under baseline venous stasis changes there is increased edema,redness, warmth on the right leg which concerns me for cellulitis. No significant pain over lesion itself which points away from localized infection. Wonder if we will need to utilize wound care option such as Dr. Sharol Given with [facility] Patient Instructions  Appears to be skin infection  Start keflex 500mg  twice a day  Elevate leg above the heart as much as possible  If we can get the leg to ooze less will get you  in some compression stockings   See me back on Monday or Tuesday of next week- please schedule before you leave  Meds ordered this encounter  Medications  . cephALEXin (KEFLEX) 500 MG capsule    Sig: Take 1 capsule (500 mg total) by mouth 2 (two) times daily.    Dispense:  14 capsule    Refill:  0   Return precautions advised.  [name], MD

## 2016-08-24 NOTE — Patient Instructions (Addendum)
Appears to be skin infection  Start keflex 500mg  twice a day  Elevate leg above the heart as much as possible  If we can get the leg to ooze less will get you in some compression stockings   See me back on Monday or Tuesday of next week- please schedule before you leave

## 2016-08-28 ENCOUNTER — Encounter: Payer: Self-pay | Admitting: Family Medicine

## 2016-08-28 ENCOUNTER — Ambulatory Visit (INDEPENDENT_AMBULATORY_CARE_PROVIDER_SITE_OTHER): Payer: Medicare Other | Admitting: Family Medicine

## 2016-08-28 VITALS — BP 136/78 | HR 68 | Temp 98.3°F | Ht 72.0 in | Wt 198.6 lb

## 2016-08-28 DIAGNOSIS — L03115 Cellulitis of right lower limb: Secondary | ICD-10-CM

## 2016-08-28 DIAGNOSIS — R600 Localized edema: Secondary | ICD-10-CM | POA: Diagnosis not present

## 2016-08-28 NOTE — Progress Notes (Signed)
Subjective:  [name] is a 83 y.o. year old very pleasant male patient who presents for/with See problem oriented charting ROS- denies calf pain or lower leg pain. Degrees the swelling is slightly better as well as decreased in the right lower leg edema   Past Medical History-  Patient Active Problem List   Diagnosis Date Noted  . Memory loss 07/21/2014    Priority: High  . Chronic atrial fibrillation ([place]) 07/08/2011    Priority: High  . Hemiparesis affecting right side as late effect of cerebrovascular accident ([place]) 03/26/2007    Priority: High  . CAD (coronary artery disease) 08/23/2006    Priority: High  . Right hydrocele 04/03/2016    Priority: Medium  . Thrombocytopenia ([place]) 11/22/2015    Priority: Medium  . CKD (chronic kidney disease), stage III 11/22/2015    Priority: Medium  . Bradycardia 07/09/2011    Priority: Medium  . Major depression in remission ([place]) 07/26/2007    Priority: Medium  . 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    Priority: Medium  . Hyperlipidemia 08/23/2006    Priority: Medium  . Essential hypertension 08/23/2006    Priority: Medium  . Actinic keratosis 07/30/2009    Priority: Low  . GERD 08/23/2006    Priority: Low    Medications- reviewed and updated Current Outpatient Prescriptions  Medication Sig Dispense Refill  . amLODipine (NORVASC) 2.5 MG tablet Take 1 tablet (2.5 mg total) by mouth daily. 90 tablet 3  . apixaban (ELIQUIS) 5 MG TABS tablet Take 1 tablet (5 mg total) by mouth 2 (two) times daily. 60 tablet 6  . cephALEXin (KEFLEX) 500 MG capsule Take 1 capsule (500 mg total) by mouth 2 (two) times daily. 14 capsule 0  . escitalopram (LEXAPRO) 20 MG tablet Take 1 tablet (20 mg total) by mouth daily. 90 tablet 3  . fluticasone (FLONASE) 50 MCG/ACT nasal spray Place 2 sprays into both nostrils daily. 16 g 3  . Lansoprazole (PREVACID PO) Take 15 mg by mouth daily.    . memantine (NAMENDA) 10 MG tablet Take 1  tablet (10 mg total) by mouth daily. 90 tablet 3  . Multiple Vitamin (MULTIVITAMIN) tablet Take 1 tablet by mouth daily.      . rosuvastatin (CRESTOR) 20 MG tablet TAKE 1 TABLET(20 MG) BY MOUTH DAILY 90 tablet 3  . tamsulosin (FLOMAX) 0.4 MG CAPS capsule TAKE 1 CAPSULE(0.4 MG) BY MOUTH AT BEDTIME 90 capsule 1  . tamsulosin (FLOMAX) 0.4 MG CAPS capsule TAKE 1 CAPSULE(0.4 MG) BY MOUTH AT BEDTIME 30 capsule 2  . triamterene-hydrochlorothiazide (MAXZIDE-25) 37.5-25 MG tablet Take 1 tablet by mouth every morning. 90 tablet 3   No current facility-administered medications for this visit.     Objective: BP 136/78   Pulse 68   Temp 98.3 F (36.8 C) (Oral)   Ht 6' (1.829 m)   Wt 198 lb 9.6 oz (90.1 kg)   SpO2 97%   BMI 26.94 kg/m  Gen: NAD, resting comfortably CV: RRR no murmurs rubs or gallops Lungs: CTAB no crackles, wheeze, rhonchi Abdomen: soft/nontender/nondistended/normal bowel sounds. No rebound or guarding.  Ext: baseline 1+ edema in left leg, right leg still with 2+ edema but slightly decreased. Prior mild erythema now gone. Area of broken skin still oozing clear fluid with compression. Right calf 2 cm larger than left 10 cm below tibial plateau Skin: warm, dry Neuro: garbled speech as per baseline  Assessment/Plan:  Leg edema, right - Plan: VAS [place]  LOWER EXTREMITY VENOUS (DVT) S: patient was seen 4 days ago with some redness, swelling surrounding a prior puncture wound. Thought to be potential cellulitis - treated with keflex 500mg  twice a day, advised elevation. We had considered compression stockings if we could get the leg to ooze less. Has some venous stasis changes and chronic 1+ bilateral edema.   Today patient and daughter admit that edema has decreased. Although erythema is mild this is also decreased. He had clear fluid at the site when I entered the room but less than last visit where he had to have a pad had underneath his leg  A/P: Leg overall is improved. Redness has  improved. Possible cellulitis which is improving-finish antibiotics   I also want to rule out DVT given Substantial swelling on the right compared to left. If Venous duplex is negative for DVT, will add compression stockings for 10-14 days to aid in resolution of wound (advised follow up at that time if not improving or any questions- otherwise sees me early august). Patient can change compression stockings daily.   Return precautions advised. See after visit summary  [name], MD

## 2016-08-28 NOTE — Patient Instructions (Addendum)
Finish antibiotics- redness and swelling slightly better  Get ultrasound of left leg- wait in lobby and Nonah Mattes will alert you to the date and time.   If this scan is negative - I think we need to get the fluid out of the leg to help it stop swelling. Start compression stockings and reevaluate in 10-14 days. They will likely get some of the clear fluid on them- can wash daily.   Let us know immediately if fever, worsening swelling, new pain

## 2016-08-29 ENCOUNTER — Telehealth: Payer: Self-pay

## 2016-08-29 ENCOUNTER — Ambulatory Visit (HOSPITAL_COMMUNITY)
Admission: RE | Admit: 2016-08-29 | Discharge: 2016-08-29 | Disposition: A | Discharge status: Home / Self Care | Payer: Medicare Other | Source: Ambulatory Visit | Attending: Family Medicine | Admitting: Family Medicine

## 2016-08-29 DIAGNOSIS — R600 Localized edema: Secondary | ICD-10-CM | POA: Insufficient documentation

## 2016-08-29 NOTE — Telephone Encounter (Signed)
Received a call from [place] at the [facility] that [name] is negative for a DVT in the right lower leg.

## 2016-08-29 NOTE — Progress Notes (Addendum)
**  Preliminary report by tech**  Right lower extremity venous duplex complete. There is no evidence of deep or superficial vein thrombosis involving the right lower extremity. All visualized vessels appear patent and compressible. There is no evidence of a Baker's cyst on the right. Results were given to [facility] LPN at Dr. Ansel Bong office.  08/29/16 9:21 AM [name] RVT

## 2016-08-29 NOTE — Telephone Encounter (Signed)
good news. Please inform them. Have them start compression stockings. 2 week follow up reasonable

## 2016-09-01 NOTE — Telephone Encounter (Signed)
Spoke with daughter (on [place]) advised of results; daughter states that they picked up compression stockings today; patient still has small wound to shin that is weeping clear fluids at times; patient has been covering with bandaide; reviewed s/s of wound infection to report, understanding voiced to all instructions and results

## 2016-09-01 NOTE — Telephone Encounter (Signed)
Left message for return call to office.  

## 2016-09-19 ENCOUNTER — Ambulatory Visit (HOSPITAL_COMMUNITY)
Admission: RE | Admit: 2016-09-19 | Discharge: 2016-09-19 | Disposition: A | Discharge status: Home / Self Care | Payer: Medicare Other | Source: Ambulatory Visit | Attending: Nurse Practitioner | Admitting: Nurse Practitioner

## 2016-09-19 ENCOUNTER — Encounter (HOSPITAL_COMMUNITY): Payer: Self-pay | Admitting: Nurse Practitioner

## 2016-09-19 VITALS — BP 154/64 | HR 52 | Ht 72.0 in | Wt 201.0 lb

## 2016-09-19 DIAGNOSIS — Z9049 Acquired absence of other specified parts of digestive tract: Secondary | ICD-10-CM | POA: Insufficient documentation

## 2016-09-19 DIAGNOSIS — Z882 Allergy status to sulfonamides status: Secondary | ICD-10-CM | POA: Insufficient documentation

## 2016-09-19 DIAGNOSIS — Z7901 Long term (current) use of anticoagulants: Secondary | ICD-10-CM | POA: Diagnosis not present

## 2016-09-19 DIAGNOSIS — Z9889 Other specified postprocedural states: Secondary | ICD-10-CM | POA: Diagnosis not present

## 2016-09-19 DIAGNOSIS — I481 Persistent atrial fibrillation: Secondary | ICD-10-CM | POA: Insufficient documentation

## 2016-09-19 DIAGNOSIS — L719 Rosacea, unspecified: Secondary | ICD-10-CM | POA: Insufficient documentation

## 2016-09-19 DIAGNOSIS — Z8249 Family history of ischemic heart disease and other diseases of the circulatory system: Secondary | ICD-10-CM | POA: Insufficient documentation

## 2016-09-19 DIAGNOSIS — I69320 Aphasia following cerebral infarction: Secondary | ICD-10-CM | POA: Insufficient documentation

## 2016-09-19 DIAGNOSIS — Z888 Allergy status to other drugs, medicaments and biological substances status: Secondary | ICD-10-CM | POA: Diagnosis not present

## 2016-09-19 DIAGNOSIS — I10 Essential (primary) hypertension: Secondary | ICD-10-CM | POA: Insufficient documentation

## 2016-09-19 DIAGNOSIS — Z951 Presence of aortocoronary bypass graft: Secondary | ICD-10-CM | POA: Insufficient documentation

## 2016-09-19 DIAGNOSIS — I482 Chronic atrial fibrillation, unspecified: Secondary | ICD-10-CM

## 2016-09-19 DIAGNOSIS — I2510 Atherosclerotic heart disease of native coronary artery without angina pectoris: Secondary | ICD-10-CM | POA: Diagnosis not present

## 2016-09-19 DIAGNOSIS — E785 Hyperlipidemia, unspecified: Secondary | ICD-10-CM | POA: Insufficient documentation

## 2016-09-19 DIAGNOSIS — K219 Gastro-esophageal reflux disease without esophagitis: Secondary | ICD-10-CM | POA: Diagnosis not present

## 2016-09-19 DIAGNOSIS — Z87891 Personal history of nicotine dependence: Secondary | ICD-10-CM | POA: Insufficient documentation

## 2016-09-19 DIAGNOSIS — I272 Pulmonary hypertension, unspecified: Secondary | ICD-10-CM | POA: Insufficient documentation

## 2016-09-19 LAB — BASIC METABOLIC PANEL
ANION GAP: 6 (ref 5–15)
BUN: 28 mg/dL — ABNORMAL HIGH (ref 6–20)
CO2: 29 mmol/L (ref 22–32)
Calcium: 8.8 mg/dL — ABNORMAL LOW (ref 8.9–10.3)
Chloride: 99 mmol/L — ABNORMAL LOW (ref 101–111)
Creatinine, Ser: 1.41 mg/dL — ABNORMAL HIGH (ref 0.61–1.24)
GFR calc Af Amer: 51 mL/min — ABNORMAL LOW (ref >60)
GFR calc non Af Amer: 44 mL/min — ABNORMAL LOW (ref >60)
GLUCOSE: 104 mg/dL — AB (ref 65–99)
POTASSIUM: 4 mmol/L (ref 3.5–5.1)
SODIUM: 134 mmol/L — AB (ref 135–145)

## 2016-09-19 NOTE — Progress Notes (Signed)
[name], male   DOB: 02-Jun-1928, 83 y.o.   MRN: [id number]       Primary Care Physician: [name] Olp, MD Primary Electrophysiologist: Dr. Rachelle [name] is a 83 y.o. male with a h/o long standing persistent atrial fibrillation who presents for f/u in the [facility]. The patient is aphasic due to previous stroke and is here with his daughter. He is off chronotropic drugs due to bradycardia and appears to be asymptomatic with v rate at 65 bpm. Taking eliquis without bleeding issues with a chadsvasc of at least 6.  He returns to afib clinic 03/09/16, EKG shows sinus brady at 51 with long RR intervals. He is asymptomatic with this but hard to evaluate pt due to aphasia 2/2  prior stroke. He continues with apixaban without bleeding issues.  F/u in afib clinic 7/24. He wore a 48 hour monitor in January and over alll there was no indication for a PPM. He did have a sore on his rt shin which is healing, treated by PCP. No issues with eliquis. Daughter is with him. Pt is still aphasic from prior stroke. Bmet done today and creatinine is still less than 1.5 so dose of eliquis will not be changed.  Today, he denies symptoms of palpitations, chest pain, shortness of breath, orthopnea, PND,  dizziness, presyncope, syncope, snoring, daytime somnolence, bleeding, or neurologic sequela.  Mild LLE rt leg,site of previous vein removal for bypass.The patient is tolerating medications without difficulties and is otherwise without complaint today.    Past Medical History:  Diagnosis Date  . Actinic keratosis   . BPH with urinary obstruction   . Bradycardia   . CAD (coronary artery disease)    CABG x5  . Cerebrovascular accident (stroke) ([place])    With right hemiparesis and persistent expressive aphasia  . GERD (gastroesophageal reflux disease)   . History of hydronephrosis   . Hx of hydronephrosis 07/08/2011   right   . Hyperlipidemia   .  Hypertension   . Iron deficiency anemia due to chronic blood loss 03/26/2007   Now resolved    . Iron deficiency anemia secondary to blood loss (chronic)   . Persistent atrial fibrillation ([place])   . Pulmonary hypertension ([place])    by echo 2013  . Rosacea   . Situational depression    Severe  . Tricuspid regurgitation    Past Surgical History:  Procedure Laterality Date  . ABDOMINAL SURGERY    . BACK SURGERY    . BAND HEMORRHOIDECTOMY    . CHOLECYSTECTOMY    . CORONARY ARTERY BYPASS GRAFT     x 5  . dental implants    . HERNIA REPAIR    . KIDNEY SURGERY     stenosis with stent  . TONSILLECTOMY        Allergies  Allergen Reactions  . Ciprofloxacin Hcl     unknown  . Sulfamethoxazole-Trimethoprim     unknown    Social History   Social History  . Marital status: Married    Spouse name: N/A  . Number of children: N/A  . Years of education: N/A   Occupational History  . Not on file.   Social History Main Topics  . Smoking status: Former Research scientist (life sciences)  . Smokeless tobacco: Never Used  . Alcohol use No  . Drug use: No  . Sexual activity: Not Currently   Other Topics Concern  . Not on file  Social History Narrative   Lives in [place] with spouse. LIves at a house at a retirement home. 3 children. 6 grandkids. 5 greatgrandkids.       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 Heart attack Unknown        fhx  . Coronary artery disease Unknown        fhx  . Heart disease Father    The patient does not have a history of early familial atrial fibrillation or other arrhythmias.  ROS- All systems are reviewed and negative except as per the HPI above.  Physical Exam: BP 140/62, HR 49 bpm,  GEN- The patient is well appearing, alert and oriented x 3 today.   Head- normocephalic, atraumatic Eyes-  Sclera clear, conjunctiva pink Ears- hearing intact Oropharynx-  clear Neck- supple, no JVP Lymph- no cervical lymphadenopathy Lungs- Clear to ausculation bilaterally, normal work of breathing Heart- Irregular rate and rhythm, no murmurs, rubs or gallops, PMI not laterally displaced GI- soft, NT, ND, + BS Extremities- no clubbing, cyanosis, or trace edema on rt, 1t on left (prior cabg) MS- no significant deformity or atrophy Skin- no rash or lesion Psych- euthymic mood, full affect Neuro- strength and sensation are intact  EKG afib @ 52 bpm,  LAD, qrs int 108 ms, qtc, 465 ms IRBBB Holter monitor 02/2016-Study Highlights   Atrial fibrillation with controlled ventricular rates Average heart rate 58 bpm, max heart rate 98 bpm Nocturnal pauses of up to 3.2 seconds are noted,  No daytime pauses Occasional premature ventricular contractions with rare nonsustained ventricular tachycardia      Assessment and Plan:  1. Atrial fibrillation The patient has asymptomatic longstanding persistent atrial fibrillation with slow ventricular response Monitor placed in January did  not show indication for PPM..  He  is  appropriately anticoagulated at this time.    2. Previous CVA with aphasia Continue Eliquis with chadsvasc of at least 6. Creat stable at 1.4, continue with Eliquis 5 mg bid Steady on feet, no falls.   F/u in 6 months   [name]. [name], [facility] [address] [place], [place] [address] [phone]

## 2016-09-23 ENCOUNTER — Other Ambulatory Visit: Payer: Self-pay | Admitting: Family Medicine

## 2016-09-28 ENCOUNTER — Ambulatory Visit: Payer: Medicare Other

## 2016-10-02 ENCOUNTER — Ambulatory Visit (INDEPENDENT_AMBULATORY_CARE_PROVIDER_SITE_OTHER): Payer: Medicare Other | Admitting: Family Medicine

## 2016-10-02 ENCOUNTER — Encounter: Payer: Self-pay | Admitting: Family Medicine

## 2016-10-02 VITALS — BP 136/72 | HR 67 | Temp 98.1°F | Ht 72.0 in | Wt 198.4 lb

## 2016-10-02 DIAGNOSIS — I872 Venous insufficiency (chronic) (peripheral): Secondary | ICD-10-CM | POA: Insufficient documentation

## 2016-10-02 DIAGNOSIS — I10 Essential (primary) hypertension: Secondary | ICD-10-CM

## 2016-10-02 NOTE — Progress Notes (Signed)
Subjective:  [name] is a 83 y.o. year old very pleasant male patient who presents for/with See problem oriented charting ROS- shakes head no to - chest pain, shortness of breath, worsening edema. Shakes head yes to improved swelling with compression stockings.   Past Medical History-  Patient Active Problem List   Diagnosis Date Noted  . Memory loss 07/21/2014    Priority: High  . Chronic atrial fibrillation ([place]) 07/08/2011    Priority: High  . Hemiparesis affecting right side as late effect of cerebrovascular accident ([place]) 03/26/2007    Priority: High  . CAD (coronary artery disease) 08/23/2006    Priority: High  . Venous insufficiency 10/02/2016    Priority: Medium  . Right hydrocele 04/03/2016    Priority: Medium  . Thrombocytopenia ([place]) 11/22/2015    Priority: Medium  . CKD (chronic kidney disease), stage III 11/22/2015    Priority: Medium  . Bradycardia 07/09/2011    Priority: Medium  . Major depression in remission ([place]) 07/26/2007    Priority: Medium  . 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    Priority: Medium  . Hyperlipidemia 08/23/2006    Priority: Medium  . Essential hypertension 08/23/2006    Priority: Medium  . Actinic keratosis 07/30/2009    Priority: Low  . GERD 08/23/2006    Priority: Low    Medications- reviewed and updated Current Outpatient Prescriptions  Medication Sig Dispense Refill  . amLODipine (NORVASC) 2.5 MG tablet Take 1 tablet (2.5 mg total) by mouth daily. 90 tablet 3  . apixaban (ELIQUIS) 5 MG TABS tablet Take 1 tablet (5 mg total) by mouth 2 (two) times daily. 60 tablet 6  . escitalopram (LEXAPRO) 20 MG tablet Take 1 tablet (20 mg total) by mouth daily. 90 tablet 3  . fluticasone (FLONASE) 50 MCG/ACT nasal spray Place 2 sprays into both nostrils daily. 16 g 3  . Lansoprazole (PREVACID PO) Take 15 mg by mouth daily.    . memantine (NAMENDA) 10 MG tablet Take 1 tablet (10 mg total) by mouth daily. 90 tablet  3  . Multiple Vitamin (MULTIVITAMIN) tablet Take 1 tablet by mouth daily.      . rosuvastatin (CRESTOR) 20 MG tablet TAKE 1 TABLET(20 MG) BY MOUTH DAILY 90 tablet 3  . tamsulosin (FLOMAX) 0.4 MG CAPS capsule TAKE 1 CAPSULE(0.4 MG) BY MOUTH AT BEDTIME 90 capsule 1  . triamterene-hydrochlorothiazide (MAXZIDE-25) 37.5-25 MG tablet Take 1 tablet by mouth every morning. 90 tablet 3   Objective: BP 136/72   Pulse 67   Temp 98.1 F (36.7 C) (Oral)   Ht 6' (1.829 m)   Wt 198 lb 6.4 oz (90 kg)   SpO2 97%   BMI 26.91 kg/m  Gen: NAD, resting comfortably CV: RRR no murmurs rubs or gallops Lungs: CTAB no crackles, wheeze, rhonchi Ext: 2+ edema in right leg, 1_ in left- right calf remains larger Skin: warm, dry Neuro: garbled speech, walks with cane  Assessment/Plan:  Essential hypertension S: controlled on Triamterene-hctz 37.5-25.  Amlodipine 2.5mg .  BP Readings from Last 3 Encounters:  10/02/16 136/72  09/19/16 (!) 154/64  08/28/16 136/78  A/P: We discussed blood pressure goal of <140/90. Continue current meds:  Amlodipine likely contributes to edema but with him being so close to goal do not feel that we can stop  Venous insufficiency S: venous duplex negative for DVT early july. Asymmetrical edema with unclear cause. Compression stockings help when used but doesn't like to wear all the time.  A/P: encouraged continued compression stocking use  Return in about 6 months (around 04/04/2017).  Return precautions advised.  [name], MD

## 2016-10-02 NOTE — Patient Instructions (Addendum)
Blood pressure better on repeat.   Continue to use compression stockings- advise regularly using these to help keep swelling down  Lets check in 4-6 months- happy to see you sooner if needed

## 2016-10-02 NOTE — Assessment & Plan Note (Signed)
S: venous duplex negative for DVT early july. Asymmetrical edema with unclear cause. Compression stockings help when used but doesn't like to wear all the time.  A/P: encouraged continued compression stocking use

## 2016-10-02 NOTE — Assessment & Plan Note (Signed)
S: controlled on Triamterene-hctz 37.5-25.  Amlodipine 2.5mg .  BP Readings from Last 3 Encounters:  10/02/16 136/72  09/19/16 (!) 154/64  08/28/16 136/78  A/P: We discussed blood pressure goal of <140/90. Continue current meds:  Amlodipine likely contributes to edema but with him being so close to goal do not feel that we can stop

## 2016-10-20 ENCOUNTER — Other Ambulatory Visit: Payer: Self-pay | Admitting: Family Medicine

## 2016-10-24 ENCOUNTER — Encounter: Payer: Self-pay | Admitting: Family Medicine

## 2016-10-24 ENCOUNTER — Ambulatory Visit (INDEPENDENT_AMBULATORY_CARE_PROVIDER_SITE_OTHER): Payer: Medicare Other | Admitting: Family Medicine

## 2016-10-24 VITALS — BP 118/62 | HR 58 | Temp 98.8°F | Wt 200.2 lb

## 2016-10-24 DIAGNOSIS — M25532 Pain in left wrist: Secondary | ICD-10-CM | POA: Diagnosis not present

## 2016-10-24 NOTE — Patient Instructions (Signed)
Not sure what is causing this pain- I feel small nodular area- ? Ganglion cyst. With fall and pain level- worth considering ultrasound or x-ray potentially. Will get Dr. Nicolasa Ducking opinion of sports medicine  We will call you within a week or two about your referral to Dr. Paulla Fore. If you do not hear within 3 weeks, give [place] a call.   Could try ice on the area for 15 minutes 3-4x a day for pain control or tylenol 650mg  up to every 6 hours (can also do both together)

## 2016-10-24 NOTE — Progress Notes (Addendum)
Subjective:  [name] is a 83 y.o. year old very pleasant male patient who presents for/with See problem oriented charting ROS- no erythema over wrist, good range of motion. No fever or chills. Does not feel ill overall.    Past Medical History-  Patient Active Problem List   Diagnosis Date Noted  . Memory loss 07/21/2014    Priority: High  . Chronic atrial fibrillation ([place]) 07/08/2011    Priority: High  . Hemiparesis affecting right side as late effect of cerebrovascular accident ([place]) 03/26/2007    Priority: High  . CAD (coronary artery disease) 08/23/2006    Priority: High  . Venous insufficiency 10/02/2016    Priority: Medium  . Right hydrocele 04/03/2016    Priority: Medium  . Thrombocytopenia ([place]) 11/22/2015    Priority: Medium  . CKD (chronic kidney disease), stage III 11/22/2015    Priority: Medium  . Bradycardia 07/09/2011    Priority: Medium  . Major depression in remission ([place]) 07/26/2007    Priority: Medium  . 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    Priority: Medium  . Hyperlipidemia 08/23/2006    Priority: Medium  . Essential hypertension 08/23/2006    Priority: Medium  . Actinic keratosis 07/30/2009    Priority: Low  . GERD 08/23/2006    Priority: Low    Medications- reviewed and updated Current Outpatient Prescriptions  Medication Sig Dispense Refill  . amLODipine (NORVASC) 2.5 MG tablet Take 1 tablet (2.5 mg total) by mouth daily. 90 tablet 3  . apixaban (ELIQUIS) 5 MG TABS tablet Take 1 tablet (5 mg total) by mouth 2 (two) times daily. 60 tablet 6  . escitalopram (LEXAPRO) 20 MG tablet TAKE 1 TABLET(20 MG) BY MOUTH DAILY 90 tablet 1  . fluticasone (FLONASE) 50 MCG/ACT nasal spray Place 2 sprays into both nostrils daily. 16 g 3  . Lansoprazole (PREVACID PO) Take 15 mg by mouth daily.    . memantine (NAMENDA) 10 MG tablet Take 1 tablet (10 mg total) by mouth daily. 90 tablet 3  . Multiple Vitamin (MULTIVITAMIN) tablet  Take 1 tablet by mouth daily.      . rosuvastatin (CRESTOR) 20 MG tablet TAKE 1 TABLET(20 MG) BY MOUTH DAILY 90 tablet 3  . tamsulosin (FLOMAX) 0.4 MG CAPS capsule TAKE 1 CAPSULE(0.4 MG) BY MOUTH AT BEDTIME 30 capsule 5  . triamterene-hydrochlorothiazide (MAXZIDE-25) 37.5-25 MG tablet TAKE 1 TABLET BY MOUTH EVERY MORNING 90 tablet 1   No current facility-administered medications for this visit.     Objective: BP 118/62 (BP Location: Right Arm, Patient Position: Sitting, Cuff Size: Normal)   Pulse (!) 58   Temp 98.8 F (37.1 C) (Oral)   Wt 200 lb 3.2 oz (90.8 kg)   SpO2 98%   BMI 27.15 kg/m  Gen: NAD, resting comfortably CV: irregularly irregular Lungs: CTAB   Ext:  Left wrist with good range of motion. Intact strength at wrist and hand.  On radial side of wrist - distal portion of radius there is a small< 1 x 1 cm nodule that is very painful to touch. Can move skin on top of this without pain. Thumb abduction against resistance worsens pain slightly. Intact distal sensation to gross touch.   Assessment/Plan:  Left wrist pain - Plan: Ambulatory referral to Sports Medicine S: lump on left wrist for 2-3 days at least. May have been present longer but started complaining of pain. Never had pain like this before in the area. He rates this as  a high level of pain.   May have had a fall a few days prior to that- not sure how he fell. He was able to get up on his own. No LOC A/P: Left wrist pain over nodular area at distal portion of radius- possible cyst or ganglion cyst. Also had recent fall but doubt this represents fracture. History taking done in combination with patient's yes or no answers ans son in laws history due to garbled speech after stroke Patient Instructions  Not sure what is causing this pain- I feel small nodular area- ? Ganglion cyst. With fall and pain level- worth considering ultrasound or x-ray potentially. Will get Dr. Nicolasa Ducking opinion of sports medicine  We will call  you within a week or two about your referral to Dr. Paulla Fore. If you do not hear within 3 weeks, give [place] a call.   Could try ice on the area for 15 minutes 3-4x a day for pain control or tylenol 650mg  up to every 6 hours (can also do both together)  Future Appointments Date Time Provider [place]  03/27/2017 11:00 AM Sherran Needs, NP [facility] None    Orders Placed This Encounter  Procedures  . Ambulatory referral to Sports Medicine    Referral Priority:   Routine    Referral Type:   Consultation    Referred to Provider:   Gerda Diss, DO    Number of Visits Requested:   1   Return precautions advised.  [name], MD

## 2016-10-27 ENCOUNTER — Other Ambulatory Visit: Payer: Self-pay | Admitting: Family Medicine

## 2016-11-01 ENCOUNTER — Ambulatory Visit (INDEPENDENT_AMBULATORY_CARE_PROVIDER_SITE_OTHER): Payer: Medicare Other

## 2016-11-01 ENCOUNTER — Encounter: Payer: Self-pay | Admitting: Sports Medicine

## 2016-11-01 ENCOUNTER — Ambulatory Visit (INDEPENDENT_AMBULATORY_CARE_PROVIDER_SITE_OTHER): Payer: Medicare Other | Admitting: Sports Medicine

## 2016-11-01 VITALS — BP 120/72 | HR 98 | Ht 72.0 in | Wt 198.0 lb

## 2016-11-01 DIAGNOSIS — M25532 Pain in left wrist: Secondary | ICD-10-CM | POA: Diagnosis not present

## 2016-11-01 DIAGNOSIS — S6992XA Unspecified injury of left wrist, hand and finger(s), initial encounter: Secondary | ICD-10-CM | POA: Diagnosis not present

## 2016-11-01 MED ORDER — DICLOFENAC SODIUM 1 % TD GEL: TRANSDERMAL | 1 refills | Status: DC

## 2016-11-01 NOTE — Assessment & Plan Note (Signed)
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  No focality to his exam today other than a small wrist effusion.  We will have him begin with topical anti-inflammatories given his inability to take systemically from his apixaban use.  If any lack of improvement will consider either intra-articular versus first dorsal compartment injection depending on findings at reevaluation.  Will this point however plan to see him only as needed and he will call for reevaluation appointment if any lack of improvement.

## 2016-11-01 NOTE — Progress Notes (Signed)
OFFICE VISIT NOTE [name]. Maleiyah Releford, [place] [facility]  MARKEY DEADY - 83 y.o. male MRN [id number]  Date of birth: 02/16/1929  Visit Date: 11/01/2016  PCP: [name], MD   Referred by: [name], MD  Burlene Arnt, CMA acting as scribe for Dr. Paulla Fore.  SUBJECTIVE:   Chief Complaint  Patient presents with  . New Patient (Initial Visit)    LT wrist pain   HPI: As below and per problem based documentation when appropriate.  Mr. Causer is a new patient, referred by Dr. Yong Channel, presenting today with daughter Dolores Lory for evaluation of LT wrist pain. Pain is located over the nodular area at the distal end of the radius. Pt also points to metacarpals when asked where the pain is. Pain started around the end of August. He did have a fall but cannot recall what caused the fall. After the fall he noticed a painful lump on the LT wrist.   Worsened with use Improves with rest, Tylenol Therapies tried include : Pt was advised by Dr. Yong Channel to apply ice to the wrist but he has not tried using ice. He has tied taking Tylenol prn for pain and has gotten some relief.   Other associated symptoms include: Pt denies radiation of pain into the arm and fingers.     Review of Systems  Constitutional: Negative for chills and fever.  Respiratory: Negative for shortness of breath and wheezing.   Cardiovascular: Negative for chest pain and palpitations.  Musculoskeletal: Positive for falls and joint pain.  Neurological: Negative for dizziness, tingling and headaches.  Endo/Heme/Allergies: Bruises/bleeds easily.    Otherwise per HPI.  HISTORY & PERTINENT PRIOR DATA:  No specialty comments available. He reports that he has quit smoking. He has never used smokeless tobacco. No results for input(s): HGBA1C, LABURIC in the last 8760 hours. Medications & Allergies reviewed per EMR Patient Active Problem List   Diagnosis Date Noted    . Left wrist pain 11/01/2016  . Venous insufficiency 10/02/2016  . Right hydrocele 04/03/2016  . Thrombocytopenia ([place]) 11/22/2015  . CKD (chronic kidney disease), stage III 11/22/2015  . Memory loss 07/21/2014  . Bradycardia 07/09/2011  . Chronic atrial fibrillation ([place]) 07/08/2011  . Actinic keratosis 07/30/2009  . Major depression in remission ([place]) 07/26/2007  . 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  . Hemiparesis affecting right side as late effect of cerebrovascular accident ([place]) 03/26/2007  . Hyperlipidemia 08/23/2006  . Essential hypertension 08/23/2006  . CAD (coronary artery disease) 08/23/2006  . GERD 08/23/2006   Past Medical History:  Diagnosis Date  . Actinic keratosis   . BPH with urinary obstruction   . Bradycardia   . CAD (coronary artery disease)    CABG x5  . Cerebrovascular accident (stroke) ([place])    With right hemiparesis and persistent expressive aphasia  . GERD (gastroesophageal reflux disease)   . History of hydronephrosis   . Hx of hydronephrosis 07/08/2011   right   . Hyperlipidemia   . Hypertension   . Iron deficiency anemia due to chronic blood loss 03/26/2007   Now resolved    . Iron deficiency anemia secondary to blood loss (chronic)   . Persistent atrial fibrillation ([place])   . Pulmonary hypertension ([place])    by echo 2013  . Rosacea   . Situational depression    Severe  . Tricuspid regurgitation    Family History  Problem Relation Age  of Onset  . Heart attack Unknown        fhx  . Coronary artery disease Unknown        fhx  . Heart disease Father    Past Surgical History:  Procedure Laterality Date  . ABDOMINAL SURGERY    . BACK SURGERY    . BAND HEMORRHOIDECTOMY    . CHOLECYSTECTOMY    . CORONARY ARTERY BYPASS GRAFT     x 5  . dental implants    . HERNIA REPAIR    . KIDNEY SURGERY     stenosis with stent  . TONSILLECTOMY     Social History   Occupational History  . Not on file.   Social  History Main Topics  . Smoking status: Former Research scientist (life sciences)  . Smokeless tobacco: Never Used  . Alcohol use No  . Drug use: No  . Sexual activity: Not Currently    OBJECTIVE:  VS:  HT:6' (182.9 cm)   WT:198 lb (89.8 kg)  BMI:26.85    BP:120/72  HR:98bpm  TEMP: ( )  RESP:(!) 58 % EXAM: Findings:  WDWN, NAD, Non-toxic appearing Alert & appropriately interactive although he is nonverbal he is able to express his concerns. Not depressed or anxious appearing No increased work of breathing. Pupils are equal. EOM intact without nystagmus No clubbing or cyanosis of the extremities appreciated Senile changes of the bilateral upper extremity skin with no acute findings. Radial pulses 2+/4.  No significant generalized UE edema. Sensation intact to light touch in upper extremities.  Left wrist: Overall well aligned.  No significant deformity.  He does have small effusion of the wrist joint with a small amount of pain with palpation of the scapholunate interval but this is minimal.  No focal bony tenderness.  He has only a small amount of pain with terminal Wynn Maudlin testing but this does not localize to the first dorsal compartment.  Prescription strength is intact.  He does have pain with terminal wrist extension.  No focal pain over the TFCC.    Complete x-rays of the wrist obtained that showed no acute focal findings.  No acute fracture or dislocation.  Does have mild degenerative change of the wrist with prominent chondrocalcinosis.  ASSESSMENT & PLAN:     ICD-10-CM   1. Left wrist pain M25.532 DG Wrist Complete Left   consistent with CPPD arthrosis likely irritated with recent inj. Consider possible occult de Quervain's tenosynovitis but this is less impressive today.  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  No focality to his exam today other than a small  wrist effusion.  We will have him begin with topical anti-inflammatories given his inability to take systemically from his apixaban use.  If any lack of improvement will consider either intra-articular versus first dorsal compartment injection depending on findings at reevaluation.  Will this point however plan to see him only as needed and he will call for reevaluation appointment if any lack of improvement. =================================================================  Follow-up: Return if symptoms worsen or fail to improve.   CMA/ATC served as Education administrator during this visit. History, Physical, and Plan performed by medical provider. Documentation and orders reviewed and attested to.      Teresa Coombs, [place] [facility] Physician

## 2016-11-20 ENCOUNTER — Telehealth: Payer: Self-pay | Admitting: Sports Medicine

## 2016-11-20 NOTE — Telephone Encounter (Signed)
Patient's daughter called to advise that patient still had wrist pains and thinks that he needs more of the diclofenac sodium (did not specify which pharmacy) or may need another appointment. Call Dolores Lory at [phone] to advise.

## 2016-11-20 NOTE — Telephone Encounter (Signed)
Spoke with Dolores Lory and scheduled OV for 11/21/2016 for her dad/pt

## 2016-11-20 NOTE — Telephone Encounter (Signed)
Per last OV note: " If any lack of improvement will consider either intra-articular versus first dorsal compartment injection depending on findings at reevaluation.  Will this point however plan to see him only as needed and he will call for reevaluation appointment if any lack of improvement."  Pt needs OV

## 2016-11-21 ENCOUNTER — Ambulatory Visit (INDEPENDENT_AMBULATORY_CARE_PROVIDER_SITE_OTHER): Payer: Medicare Other | Admitting: Sports Medicine

## 2016-11-21 ENCOUNTER — Ambulatory Visit: Payer: Self-pay

## 2016-11-21 ENCOUNTER — Encounter: Payer: Self-pay | Admitting: Sports Medicine

## 2016-11-21 VITALS — BP 138/72 | HR 52 | Ht 72.0 in | Wt 200.6 lb

## 2016-11-21 DIAGNOSIS — I69351 Hemiplegia and hemiparesis following cerebral infarction affecting right dominant side: Secondary | ICD-10-CM | POA: Diagnosis not present

## 2016-11-21 DIAGNOSIS — N183 Chronic kidney disease, stage 3 unspecified: Secondary | ICD-10-CM

## 2016-11-21 DIAGNOSIS — I482 Chronic atrial fibrillation, unspecified: Secondary | ICD-10-CM

## 2016-11-21 DIAGNOSIS — M25532 Pain in left wrist: Secondary | ICD-10-CM | POA: Diagnosis not present

## 2016-11-21 NOTE — Progress Notes (Signed)
OFFICE VISIT NOTE [name]. [name], [name] at [facility]  [name] - 83 y.o. male MRN [id number]  Date of birth: Aug 29, 1928  Visit Date: 11/21/2016  PCP: [name], MD   Referred by: [name], MD  Burlene Arnt, CMA acting as scribe for Dr. Paulla Fore.  SUBJECTIVE:   Chief Complaint  Patient presents with  . Follow-up    LT wrist pain   HPI: As below and per problem based documentation when appropriate.  [name] is an established patient presenting today in follow-up of LT wrist pain. He was last seen 11/01/2016 and was provided with Voltaren gel to apply topically to the area.   Pt and daughter report that his wrist pain continues despite use of Pennsaid. He used all of the Pennsaid and has been out for a couple of days, since last Thursday. Pain is about the same as it was at the last visit. He has pain when he moves his thumb. The bump on his wrist has reappeared. He says that the bump is not tender to palpation.     Review of Systems  Constitutional: Negative for chills and fever.  Respiratory: Negative for shortness of breath.   Cardiovascular: Negative for chest pain.  Musculoskeletal: Positive for joint pain.  Neurological: Negative for dizziness and headaches.  Endo/Heme/Allergies: Bruises/bleeds easily.    Otherwise per HPI.  HISTORY & PERTINENT PRIOR DATA:  No specialty comments available. He reports that he has quit smoking. He has never used smokeless tobacco. No results for input(s): HGBA1C, LABURIC in the last 8760 hours. Allergies reviewed per EMR Prior to Admission medications   Medication Sig Start Date End Date Taking? Authorizing Provider  amLODipine (NORVASC) 2.5 MG tablet Take 1 tablet (2.5 mg total) by mouth daily. 11/22/15   [name], MD  apixaban (ELIQUIS) 5 MG TABS tablet Take 1 tablet (5 mg total) by mouth 2 (two) times daily. 06/01/16   [name], MD    diclofenac sodium (VOLTAREN) 1 % GEL Apply topically to affected area BID scheduled X 5 days then okay to transition to BID PRN 11/01/16   Gerda Diss, DO  escitalopram (LEXAPRO) 20 MG tablet TAKE 1 TABLET(20 MG) BY MOUTH DAILY 10/23/16   [name], MD  fluticasone [facility]) 50 MCG/ACT nasal spray Place 2 sprays into both nostrils daily. 07/27/16   [name], MD  Lansoprazole (PREVACID PO) Take 15 mg by mouth daily.    [provider]  memantine (NAMENDA) 10 MG tablet TAKE 1 TABLET(10 MG) BY MOUTH DAILY 10/27/16   [name], MD  Multiple Vitamin (MULTIVITAMIN) tablet Take 1 tablet by mouth daily.      [provider]  rosuvastatin (CRESTOR) 20 MG tablet TAKE 1 TABLET(20 MG) BY MOUTH DAILY 11/22/15   [name], MD  tamsulosin (FLOMAX) 0.4 MG CAPS capsule TAKE 1 CAPSULE(0.4 MG) BY MOUTH AT BEDTIME 10/23/16   [name], MD  triamterene-hydrochlorothiazide [facility]) 37.5-25 MG tablet TAKE 1 TABLET BY MOUTH EVERY MORNING 10/23/16   [name], MD   Patient Active Problem List   Diagnosis Date Noted  . Left wrist pain 11/01/2016  . Venous insufficiency 10/02/2016  . Right hydrocele 04/03/2016  . Thrombocytopenia ([place]) 11/22/2015  . CKD (chronic kidney disease), stage III 11/22/2015  . Memory loss 07/21/2014  . Bradycardia 07/09/2011  . Chronic atrial fibrillation ([place]) 07/08/2011  . Actinic keratosis  07/30/2009  . Major depression in remission ([place]) 07/26/2007  . 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  . Hemiparesis affecting right side as late effect of cerebrovascular accident ([place]) 03/26/2007  . Hyperlipidemia 08/23/2006  . Essential hypertension 08/23/2006  . CAD (coronary artery disease) 08/23/2006  . GERD 08/23/2006   Past Medical History:  Diagnosis Date  . Actinic keratosis   . BPH with urinary obstruction   . Bradycardia   . CAD (coronary artery disease)    CABG x5  . Cerebrovascular  accident (stroke) ([place])    With right hemiparesis and persistent expressive aphasia  . GERD (gastroesophageal reflux disease)   . History of hydronephrosis   . Hx of hydronephrosis 07/08/2011   right   . Hyperlipidemia   . Hypertension   . Iron deficiency anemia due to chronic blood loss 03/26/2007   Now resolved    . Iron deficiency anemia secondary to blood loss (chronic)   . Persistent atrial fibrillation ([place])   . Pulmonary hypertension ([place])    by echo 2013  . Rosacea   . Situational depression    Severe  . Tricuspid regurgitation    Family History  Problem Relation Age of Onset  . Heart attack Unknown        fhx  . Coronary artery disease Unknown        fhx  . Heart disease Father    Past Surgical History:  Procedure Laterality Date  . ABDOMINAL SURGERY    . BACK SURGERY    . BAND HEMORRHOIDECTOMY    . CHOLECYSTECTOMY    . CORONARY ARTERY BYPASS GRAFT     x 5  . dental implants    . HERNIA REPAIR    . KIDNEY SURGERY     stenosis with stent  . TONSILLECTOMY     Social History   Occupational History  . Not on file.   Social History Main Topics  . Smoking status: Former Research scientist (life sciences)  . Smokeless tobacco: Never Used  . Alcohol use No  . Drug use: No  . Sexual activity: Not Currently    OBJECTIVE:  VS:  HT:6' (182.9 cm)   WT:200 lb 9.6 oz (91 kg)  BMI:27.2    BP:138/72  HR:(!) 52bpm  TEMP: ( )  RESP:98 % EXAM: Findings:  Adult male.  In no acute distress.  Difficult to communicate with but able to indicate using yes and no answers.  His left wrist is painful with Wynn Maudlin testing but this is mild.  Pain is worse with extension component.  Grip strength is intact.  There is a small cystic structure across the radial aspect of the wrist at the level of the DRUJ.  The cyst is palpable and soft.    RADIOLOGY: DG Wrist Complete Left CLINICAL DATA:  Status post fall 2 weeks ago. Posterior left wrist pain.  EXAM: LEFT WRIST - COMPLETE 3+  VIEW  COMPARISON:  None.  FINDINGS: No acute fracture dislocation. Mild osteoarthritis of the scaphotrapeziotrapezoid joint. Moderate osteoarthritis of the first [place] joint and first MCP joint. Mild chondrocalcinosis of the TFCC. No focal soft tissue abnormality.  IMPRESSION: 1.  No acute osseous injury of the left wrist.  Electronically Signed   By: Kathreen Devoid   On: 11/01/2016 12:46  ASSESSMENT & PLAN:     ICD-10-CM   1. Left wrist pain M25.532 [place] LIMITED JOINT SPACE STRUCTURES UP LEFT(NO LINKED CHARGES)  2. CKD (chronic kidney disease), stage III N18.3   3.  Hemiparesis affecting right side as late effect of cerebrovascular accident ([place]) I69.351   4. Chronic atrial fibrillation (HCC) I48.2    ================================================================= Left wrist pain Symptoms are consistent with de Quervain's tenosynovitis and extensor pollicis longus tendinosis.  First dorsal compartment injection performed today.  He has failed other conservative measures and should benefit greatly from this.  We will need to avoid systemic NSAIDs due to his underlying other medical issues but he can continue with topical intermittently over the next several days.   PROCEDURE NOTE -  ULTRASOUND GUIDEDINJECTION: Left first dorsal compartment Images were obtained and interpreted by myself, Teresa Coombs, DO  Images have been saved and stored to PACS system. Images obtained on: GE S7 Ultrasound machine  ULTRASOUND FINDINGS: Marked thickening of the EPL with overall good/well-maintained motion and minimal stenosing tenosynovitis.  There is a small cystic structure coming off of the first dorsal compartment consistent with a synovial sheath ganglion; no communication with the intra-articular components of the wrist which are unremarkable  DESCRIPTION OF PROCEDURE:  The patient's clinical condition is marked by substantial pain and/or significant functional disability. Other conservative  therapy has not provided relief, is contraindicated, or not appropriate. There is a reasonable likelihood that injection will significantly improve the patient's pain and/or functional impairment. After discussing the risks, benefits and expected outcomes of the injection and all questions were reviewed and answered, the patient wished to undergo the above named procedure. Verbal consent was obtained. The ultrasound was used to identify the target structure and adjacent neurovascular structures. The skin was then prepped in sterile fashion and the target structure was injected under direct visualization using sterile technique as below: PREP: Alcohol, Ethel Chloride APPROACH: Direct inplane, single injection, 25g 1.5" needle INJECTATE: 0.5cc 1% lidocaine, 0.5cc 0.5% marcaine, 0.5cc 40mg  DepoMedrol ASPIRATE: N/A DRESSING: Band-Aid  Post procedural instructions including recommending icing and warning signs for infection were reviewed. This procedure was well tolerated and there were no complications.   IMPRESSION: Succesful US Guided Injection    =================================================================  Follow-up: Return in about 4 weeks (around 12/19/2016).   CMA/ATC served as Education administrator during this visit. History, Physical, and Plan performed by medical provider. Documentation and orders reviewed and attested to.      Teresa Coombs, [place] Sports Medicine Physician

## 2016-11-21 NOTE — Assessment & Plan Note (Signed)
Symptoms are consistent with de Quervain's tenosynovitis and extensor pollicis longus tendinosis.  First dorsal compartment injection performed today.  He has failed other conservative measures and should benefit greatly from this.  We will need to avoid systemic NSAIDs due to his underlying other medical issues but he can continue with topical intermittently over the next several days.

## 2016-11-21 NOTE — Patient Instructions (Signed)

## 2016-11-21 NOTE — Procedures (Signed)
PROCEDURE NOTE -  ULTRASOUND GUIDEDINJECTION: Left first dorsal compartment Images were obtained and interpreted by myself, Teresa Coombs, DO  Images have been saved and stored to PACS system. Images obtained on: GE S7 Ultrasound machine  ULTRASOUND FINDINGS: Marked thickening of the EPL with overall good/well-maintained motion and minimal stenosing tenosynovitis.  There is a small cystic structure coming off of the first dorsal compartment consistent with a synovial sheath ganglion; no communication with the intra-articular components of the wrist which are unremarkable  DESCRIPTION OF PROCEDURE:  The patient's clinical condition is marked by substantial pain and/or significant functional disability. Other conservative therapy has not provided relief, is contraindicated, or not appropriate. There is a reasonable likelihood that injection will significantly improve the patient's pain and/or functional impairment. After discussing the risks, benefits and expected outcomes of the injection and all questions were reviewed and answered, the patient wished to undergo the above named procedure. Verbal consent was obtained. The ultrasound was used to identify the target structure and adjacent neurovascular structures. The skin was then prepped in sterile fashion and the target structure was injected under direct visualization using sterile technique as below: PREP: Alcohol, Ethel Chloride APPROACH: Direct inplane, single injection, 25g 1.5" needle INJECTATE: 0.5cc 1% lidocaine, 0.5cc 0.5% marcaine, 0.5cc 40mg  DepoMedrol ASPIRATE: N/A DRESSING: Band-Aid  Post procedural instructions including recommending icing and warning signs for infection were reviewed. This procedure was well tolerated and there were no complications.   IMPRESSION: Succesful US Guided Injection

## 2016-11-27 ENCOUNTER — Other Ambulatory Visit: Payer: Self-pay | Admitting: Family Medicine

## 2016-12-19 ENCOUNTER — Ambulatory Visit: Payer: Medicare Other | Admitting: Sports Medicine

## 2016-12-26 ENCOUNTER — Encounter: Payer: Self-pay | Admitting: Sports Medicine

## 2016-12-26 ENCOUNTER — Ambulatory Visit (INDEPENDENT_AMBULATORY_CARE_PROVIDER_SITE_OTHER): Payer: Medicare Other | Admitting: Sports Medicine

## 2016-12-26 VITALS — BP 136/64 | HR 53 | Ht 72.0 in | Wt 200.2 lb

## 2016-12-26 DIAGNOSIS — M25532 Pain in left wrist: Secondary | ICD-10-CM

## 2016-12-26 DIAGNOSIS — I482 Chronic atrial fibrillation, unspecified: Secondary | ICD-10-CM

## 2016-12-26 DIAGNOSIS — N183 Chronic kidney disease, stage 3 unspecified: Secondary | ICD-10-CM

## 2016-12-26 MED ORDER — APIXABAN 5 MG PO TABS: 5.0000 mg | ORAL_TABLET | Freq: Two times a day (BID) | ORAL | 1 refills | Status: DC

## 2016-12-26 NOTE — Progress Notes (Signed)
OFFICE VISIT NOTE [name], [name]  [name] - 83 y.o. male MRN [id number]  Date of birth: 1928/05/24  Visit Date: 12/26/2016  PCP: [name], MD   Referred by: [name], MD  Thalia Bloodgood PT, LAT, ATC acting as scribe for Dr. Paulla Fore.  SUBJECTIVE:   Chief Complaint  Patient presents with  . Follow-up    L wrist pain   HPI: As below and per problem based documentation when appropriate.  [name] is an established pt presenting today for f/u of his L wrist pain.  He was last seen on 11/21/16 and had an injection.  He was also prescribed Pennsaid at his last visit.  Pt states that he feels like the injection helped and notes that he is having some pain at the observable bump at the distal radius.    ROS  Otherwise per HPI.  HISTORY & PERTINENT PRIOR DATA:  No specialty comments available. He reports that he has quit smoking. He has never used smokeless tobacco. No results for input(s): HGBA1C, LABURIC, CREATINE in the last 8760 hours.  Invalid input(s): CR Allergies reviewed per EMR Prior to Admission medications   Medication Sig Start Date End Date Taking? Authorizing Provider  amLODipine (NORVASC) 2.5 MG tablet TAKE 1 TABLET(2.5 MG) BY MOUTH DAILY 12/04/16  Yes [name], MD  apixaban (ELIQUIS) 5 MG TABS tablet Take 1 tablet (5 mg total) by mouth 2 (two) times daily. 06/01/16  Yes [name], MD  escitalopram (LEXAPRO) 20 MG tablet TAKE 1 TABLET(20 MG) BY MOUTH DAILY 10/23/16  Yes [name], MD  Lansoprazole (PREVACID PO) Take 15 mg by mouth daily.   Yes [provider]  memantine (NAMENDA) 10 MG tablet TAKE 1 TABLET(10 MG) BY MOUTH DAILY 10/27/16  Yes [name], MD  Multiple Vitamin (MULTIVITAMIN) tablet Take 1 tablet by mouth daily.     Yes [provider]  rosuvastatin (CRESTOR) 20 MG tablet TAKE 1 TABLET(20 MG) BY MOUTH DAILY  11/22/15  Yes [name], MD  tamsulosin (FLOMAX) 0.4 MG CAPS capsule TAKE 1 CAPSULE(0.4 MG) BY MOUTH AT BEDTIME 10/23/16  Yes [name], MD  triamterene-hydrochlorothiazide (CHENIDP-82) 37.5-25 MG tablet TAKE 1 TABLET BY MOUTH EVERY MORNING 10/23/16  Yes [name], MD  diclofenac sodium (VOLTAREN) 1 % GEL Apply topically to affected area BID scheduled X 5 days then okay to transition to BID PRN Patient not taking: Reported on 12/26/2016 11/01/16   Gerda Diss, DO   Patient Active Problem List   Diagnosis Date Noted  . Left wrist pain 11/01/2016  . Venous insufficiency 10/02/2016  . Right hydrocele 04/03/2016  . Thrombocytopenia ([place]) 11/22/2015  . CKD (chronic kidney disease), stage III ([place]) 11/22/2015  . Memory loss 07/21/2014  . Bradycardia 07/09/2011  . Chronic atrial fibrillation ([place]) 07/08/2011  . Actinic keratosis 07/30/2009  . Major depression in remission ([place]) 07/26/2007  . 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  . Hemiparesis affecting right side as late effect of cerebrovascular accident ([place]) 03/26/2007  . Hyperlipidemia 08/23/2006  . Essential hypertension 08/23/2006  . CAD (coronary artery disease) 08/23/2006  . GERD 08/23/2006   Past Medical History:  Diagnosis Date  . Actinic keratosis   . BPH with urinary obstruction   . Bradycardia   . CAD (coronary artery disease)    CABG x5  . Cerebrovascular accident (  stroke) ([place])    With right hemiparesis and persistent expressive aphasia  . GERD (gastroesophageal reflux disease)   . History of hydronephrosis   . Hx of hydronephrosis 07/08/2011   right   . Hyperlipidemia   . Hypertension   . Iron deficiency anemia due to chronic blood loss 03/26/2007   Now resolved    . Iron deficiency anemia secondary to blood loss (chronic)   . Persistent atrial fibrillation ([place])   . Pulmonary hypertension ([place])    by echo 2013  . Rosacea   . Situational depression    Severe  .  Tricuspid regurgitation    Family History  Problem Relation Age of Onset  . Heart attack Unknown        fhx  . Coronary artery disease Unknown        fhx  . Heart disease Father    Past Surgical History:  Procedure Laterality Date  . ABDOMINAL SURGERY    . BACK SURGERY    . BAND HEMORRHOIDECTOMY    . CHOLECYSTECTOMY    . CORONARY ARTERY BYPASS GRAFT     x 5  . dental implants    . HERNIA REPAIR    . KIDNEY SURGERY     stenosis with stent  . TONSILLECTOMY     Social History   Occupational History  . Not on file.   Social History Main Topics  . Smoking status: Former Research scientist (life sciences)  . Smokeless tobacco: Never Used  . Alcohol use No  . Drug use: No  . Sexual activity: Not Currently    OBJECTIVE:  VS:  HT:6' (182.9 cm)   WT:200 lb 3.2 oz (90.8 kg)  BMI:27.15    BP:136/64  HR:(!) 53bpm  TEMP: ( )  RESP:96 % EXAM: Findings:  Left wrist with chronic senile purpuric changes but overall well aligned without significant effusion.  No pain with Wynn Maudlin testing.  Grip strength is intact.  He has a small ganglion along the radial aspect of the joint line but this is minimal.  Nonpainful.  No significant wrist effusion appreciated.    RADIOLOGY: [place] LIMITED JOINT SPACE STRUCTURES UP LEFT(NO LINKED CHARGES) Gerda Diss, DO     11/21/2016  1:31 PM PROCEDURE NOTE -  ULTRASOUND GUIDEDINJECTION: Left first dorsal  compartment Images were obtained and interpreted by myself, Teresa Coombs, DO   Images have been saved and stored to PACS system. Images obtained on: GE S7 Ultrasound machine  ULTRASOUND FINDINGS: Marked thickening of the EPL with overall  good/well-maintained motion and minimal stenosing tenosynovitis.   There is a small cystic structure coming off of the first dorsal  compartment consistent with a synovial sheath ganglion; no  communication with the intra-articular components of the wrist  which are unremarkable  DESCRIPTION OF PROCEDURE:  The patient's  clinical condition is marked by substantial pain  and/or significant functional disability. Other conservative  therapy has not provided relief, is contraindicated, or not  appropriate. There is a reasonable likelihood that injection will  significantly improve the patient's pain and/or functional  impairment. After discussing the risks, benefits and expected  outcomes of the injection and all questions were reviewed and  answered, the patient wished to undergo the above named  procedure. Verbal consent was obtained. The ultrasound was used  to identify the target structure and adjacent neurovascular  structures. The skin was then prepped in sterile fashion and the  target structure was injected under direct visualization using  sterile technique as below: PREP:  Alcohol, Ethel Chloride APPROACH: Direct inplane, single injection, 25g 1.5" needle INJECTATE: 0.5cc 1% lidocaine, 0.5cc 0.5% marcaine, 0.5cc 40mg   DepoMedrol ASPIRATE: N/A DRESSING: Band-Aid  Post procedural instructions including recommending icing and  warning signs for infection were reviewed. This procedure was  well tolerated and there were no complications.   IMPRESSION: Succesful US Guided Injection    ASSESSMENT & PLAN:     ICD-10-CM   1. Left wrist pain M25.532   2. CKD (chronic kidney disease), stage III (HCC) N18.3   3. Chronic atrial fibrillation (HCC) I48.2    ================================================================= Left wrist pain Underlying OA with prior de Quervain's tenosynovitis that has improved with injection.  His symptoms are minimal.  He has a right-sided hemiparesis and is reliant on his left wrist and perform activities as needed.  If any lack of improvement can consider repeat injection.  No notes on file ================================================================= Patient Instructions  It was good to see you. 6 [name] will send in a refill request. If you have any  worsening of your left wrist pain please can consider a repeat injection.  Call me so that we can meet you back in and consider a repeat injection.  =================================================================  Follow-up: Return if symptoms worsen or fail to improve.   CMA/ATC served as Education administrator during this visit. History, Physical, and Plan performed by medical provider. Documentation and orders reviewed and attested to.      Teresa Coombs, [facility] Physician

## 2016-12-26 NOTE — Addendum Note (Signed)
Addended by: Elio Forget on: 12/26/2016 11:07 AM   Modules accepted: Orders

## 2016-12-26 NOTE — Patient Instructions (Signed)
It was good to see you. 6 Dr. Yong Channel will send in a refill request. If you have any worsening of your left wrist pain please can consider a repeat injection.  Call me so that we can meet you back in and consider a repeat injection.

## 2016-12-26 NOTE — Assessment & Plan Note (Signed)
Underlying OA with prior de Quervain's tenosynovitis that has improved with injection.  His symptoms are minimal.  He has a right-sided hemiparesis and is reliant on his left wrist and perform activities as needed.  If any lack of improvement can consider repeat injection.

## 2017-01-29 ENCOUNTER — Other Ambulatory Visit: Payer: Self-pay | Admitting: Family Medicine

## 2017-02-14 ENCOUNTER — Ambulatory Visit: Payer: Self-pay

## 2017-02-14 ENCOUNTER — Encounter: Payer: Self-pay | Admitting: Sports Medicine

## 2017-02-14 ENCOUNTER — Ambulatory Visit: Payer: Medicare Other | Admitting: Sports Medicine

## 2017-02-14 VITALS — BP 122/82 | HR 81 | Ht 72.0 in | Wt 200.0 lb

## 2017-02-14 DIAGNOSIS — M25532 Pain in left wrist: Secondary | ICD-10-CM

## 2017-02-14 DIAGNOSIS — M654 Radial styloid tenosynovitis [de Quervain]: Secondary | ICD-10-CM

## 2017-02-14 NOTE — Procedures (Signed)
PROCEDURE NOTE -  ULTRASOUND GUIDEDInjection: Left 1st dorsal compartment Images were obtained and interpreted by myself, Teresa Coombs, DO  Images have been saved and stored to PACS system. Images obtained on: GE S7 Ultrasound machine  ULTRASOUND FINDINGS:  Marked halo sign Tendon thickening and partial splitting   DESCRIPTION OF PROCEDURE:  The patient's clinical condition is marked by substantial pain and/or significant functional disability. Other conservative therapy has not provided relief, is contraindicated, or not appropriate. There is a reasonable likelihood that injection will significantly improve the patient's pain and/or functional impairment.  After discussing the risks, benefits and expected outcomes of the injection and all questions were reviewed and answered, the patient wished to undergo the above named procedure. Verbal consent was obtained.  The ultrasound was used to identify the target structure and adjacent neurovascular structures. The skin was then prepped in sterile fashion and the target structure was injected under direct visualization using sterile technique as below:  Left PREP: Alcohol, Ethel Chloride,  APPROACH: direct, single injection, 25g 1.5in. INJECTATE: 0.5cc 1% lidocaine, 0.5cc 0.5% marcaine, 0.5cc 40mg /mL DepoMedrol  ASPIRATE: N/A DRESSING: Band-Aid    Post procedural instructions including recommending icing and warning signs for infection were reviewed.  This procedure was well tolerated and there were no complications.   IMPRESSION: Succesful US Guided Injection

## 2017-02-14 NOTE — Patient Instructions (Signed)

## 2017-02-14 NOTE — Progress Notes (Signed)
[name]. [name], [facility]  [name] - 83 y.o. male MRN [id number]  Date of birth: [birth year]  Visit Date: 02/14/2017  PCP: [name] Olp, MD   Referred by: [name] Olp, MD   Scribe for today's visit: [name], CMA    SUBJECTIVE:  [name] is here for Follow-up (LT wrist pain)  12/26/16 summary: [name] is an established pt presenting today for f/u of his L wrist pain.  He was last seen on 11/21/16 and had an injection.  He was also prescribed Pennsaid at his last visit.  Pt states that he feels like the injection helped and notes that he is having some pain at the observable bump at the distal radius.   Compared to the last office visit, his previously described symptoms are worsening  Current symptoms are moderate & radiates into the thumb.  He has tried Pennsaid in the past with no relief. He had steroid injection 11/21/16 with some relief.  Pain has been present for about 1 week and got worse yesterday. No recent falls. Daughter [name] reports that he flinched d/t the pain when she touched his wrist yesterday.    ROS Denies night time disturbances. Denies fevers, chills, or night sweats. Denies unexplained weight loss. Denies personal history of cancer. Denies changes in bowel or bladder habits. Denies recent unreported falls. Denies new or worsening dyspnea or wheezing. Denies headaches or dizziness.  Denies numbness, tingling or weakness  In the extremities.  Denies dizziness or presyncopal episodes Denies lower extremity edema     HISTORY & PERTINENT PRIOR DATA:  Prior History reviewed and updated per electronic medical record.  Significant history, findings, studies and interim changes include:  reports that he has quit smoking. he has never used smokeless tobacco. No results for input(s): HGBA1C, LABURIC, CREATINE in the last 8760 hours. No specialty comments  available. Problem  Left Wrist Pain     OBJECTIVE:  VS:  HT:6' (182.9 cm)   WT:200 lb (90.7 kg)  BMI:27.12    BP:122/82  HR:81bpm  TEMP: ( )  RESP:97 %  PHYSICAL EXAM: Constitutional: WDWN, Non-toxic appearing. Psychiatric: Alert and appropriate, in no acute distress.  Nonverbal but able to communicate his wishes and desires nonverbally well. Respiratory: No increased work of breathing. Trachea Midline Eyes: Pupils are equal. EOM intact without nystagmus. No scleral icterus Cardiovascular:  Peripheral Pulses: peripheral pulses symmetrical No clubbing or cyanosis appreciated Capillary Refill is normal, less than 2 seconds No signficant generalized edema/anasarca Sensory Exam: intact to light touch   Left hand overall well aligned.  He has pain across the anatomic snuffbox as well as radiating up into the wrist.  He has marked pain with Wynn Maudlin testing.  Small nodule over the radial styloid but this is minimal.  Grip strength is intact. No additional findings.   ASSESSMENT & PLAN:   1. Left wrist pain    PLAN:    Left wrist pain Symptoms are consistent once again with de Quervain's tenosynovitis with significant swelling and adhesions of the tendon and synovium.  Injection performed again today with overall good improvement in his symptoms as well as a palpable lysis of adhesions during the procedure.  We will plan to see how he does with this and follow-up with him on an as-needed basis for repeat injections   ++++++++++++++++++++++++++++++++++++++++++++ Orders & Meds: Orders Placed This Encounter  Procedures  . [place] LIMITED JOINT  SPACE STRUCTURES UP LEFT(NO LINKED CHARGES)    No orders of the defined types were placed in this encounter.   ++++++++++++++++++++++++++++++++++++++++++++ Follow-up: Return if symptoms worsen or fail to improve.   Pertinent documentation may be included in additional procedure notes, imaging studies, problem based documentation and  patient instructions. Please see these sections of the encounter for additional information regarding this visit. CMA/ATC served as Education administrator during this visit. History, Physical, and Plan performed by medical provider. Documentation and orders reviewed and attested to.      [name], [facility] Physician

## 2017-02-14 NOTE — Assessment & Plan Note (Signed)
Symptoms are consistent once again with de Quervain's tenosynovitis with significant swelling and adhesions of the tendon and synovium.  Injection performed again today with overall good improvement in his symptoms as well as a palpable lysis of adhesions during the procedure.  We will plan to see how he does with this and follow-up with him on an as-needed basis for repeat injections

## 2017-03-19 ENCOUNTER — Other Ambulatory Visit: Payer: Self-pay | Admitting: Family Medicine

## 2017-03-27 ENCOUNTER — Ambulatory Visit (HOSPITAL_COMMUNITY)
Admission: RE | Admit: 2017-03-27 | Discharge: 2017-03-27 | Disposition: A | Discharge status: Home / Self Care | Payer: Medicare Other | Source: Ambulatory Visit | Attending: Nurse Practitioner | Admitting: Nurse Practitioner

## 2017-03-27 ENCOUNTER — Encounter (HOSPITAL_COMMUNITY): Payer: Self-pay | Admitting: Nurse Practitioner

## 2017-03-27 VITALS — BP 152/78 | HR 57 | Ht 72.0 in | Wt 203.2 lb

## 2017-03-27 DIAGNOSIS — I69351 Hemiplegia and hemiparesis following cerebral infarction affecting right dominant side: Secondary | ICD-10-CM | POA: Diagnosis not present

## 2017-03-27 DIAGNOSIS — F3289 Other specified depressive episodes: Secondary | ICD-10-CM | POA: Insufficient documentation

## 2017-03-27 DIAGNOSIS — I2510 Atherosclerotic heart disease of native coronary artery without angina pectoris: Secondary | ICD-10-CM | POA: Insufficient documentation

## 2017-03-27 DIAGNOSIS — I482 Chronic atrial fibrillation, unspecified: Secondary | ICD-10-CM

## 2017-03-27 DIAGNOSIS — N401 Enlarged prostate with lower urinary tract symptoms: Secondary | ICD-10-CM | POA: Diagnosis not present

## 2017-03-27 DIAGNOSIS — I69320 Aphasia following cerebral infarction: Secondary | ICD-10-CM | POA: Diagnosis not present

## 2017-03-27 DIAGNOSIS — L719 Rosacea, unspecified: Secondary | ICD-10-CM | POA: Diagnosis not present

## 2017-03-27 DIAGNOSIS — N138 Other obstructive and reflux uropathy: Secondary | ICD-10-CM | POA: Insufficient documentation

## 2017-03-27 DIAGNOSIS — I071 Rheumatic tricuspid insufficiency: Secondary | ICD-10-CM | POA: Diagnosis not present

## 2017-03-27 DIAGNOSIS — Z951 Presence of aortocoronary bypass graft: Secondary | ICD-10-CM | POA: Diagnosis not present

## 2017-03-27 DIAGNOSIS — E785 Hyperlipidemia, unspecified: Secondary | ICD-10-CM | POA: Insufficient documentation

## 2017-03-27 DIAGNOSIS — Z7901 Long term (current) use of anticoagulants: Secondary | ICD-10-CM | POA: Diagnosis not present

## 2017-03-27 DIAGNOSIS — K219 Gastro-esophageal reflux disease without esophagitis: Secondary | ICD-10-CM | POA: Insufficient documentation

## 2017-03-27 DIAGNOSIS — Z87891 Personal history of nicotine dependence: Secondary | ICD-10-CM | POA: Insufficient documentation

## 2017-03-27 DIAGNOSIS — I481 Persistent atrial fibrillation: Secondary | ICD-10-CM | POA: Diagnosis present

## 2017-03-27 DIAGNOSIS — I10 Essential (primary) hypertension: Secondary | ICD-10-CM | POA: Diagnosis not present

## 2017-03-27 DIAGNOSIS — I272 Pulmonary hypertension, unspecified: Secondary | ICD-10-CM | POA: Insufficient documentation

## 2017-03-27 LAB — BASIC METABOLIC PANEL
Anion gap: 10 (ref 5–15)
BUN: 32 mg/dL — ABNORMAL HIGH (ref 6–20)
CHLORIDE: 98 mmol/L — AB (ref 101–111)
CO2: 27 mmol/L (ref 22–32)
Calcium: 8.9 mg/dL (ref 8.9–10.3)
Creatinine, Ser: 1.46 mg/dL — ABNORMAL HIGH (ref 0.61–1.24)
GFR calc non Af Amer: 42 mL/min — ABNORMAL LOW (ref >60)
GFR, EST AFRICAN AMERICAN: 48 mL/min — AB (ref >60)
Glucose, Bld: 114 mg/dL — ABNORMAL HIGH (ref 65–99)
POTASSIUM: 3.8 mmol/L (ref 3.5–5.1)
SODIUM: 135 mmol/L (ref 135–145)

## 2017-03-27 NOTE — Progress Notes (Signed)
[name], male   DOB: 01-25-1929, 84 y.o.   MRN: [id number]       Primary Care Physician: [name], MD Primary Electrophysiologist: Dr. Rachelle Hora is a 84 y.o. male with a h/o long standing persistent atrial fibrillation who presents for f/u in the [facility]. The patient is aphasic due to previous stroke and is here with his daughter. He is off chronotropic drugs due to bradycardia and appears to be asymptomatic with v rate at 57 bpm. Taking eliquis without bleeding issues with a chadsvasc of at least 6.  F/u in afib clinic 09/19/16. He wore a 48 hour monitor in January and over alll there was no indication for a PPM. He did have a sore on his rt shin which is healing, treated by PCP. No issues with eliquis. Daughter is with him. Pt is still aphasic from prior stroke. Bmet done today and creatinine is still less than 1.5 so dose of eliquis will not be changed.  F/u in afib clinic, 03/27/17, he looks well. He does local driving and does all the activities around the house for he and his wife who is on a walker.. He has a short route he drives for groceries, drug store,etc. His daughter drives him for longer trips. He remains in afib, rate controlled, usually runs in the 50's, is not lightheaded with this. No complaints today. Remains aphasic from a old remote stroke.  Today, he denies symptoms of palpitations, chest pain, shortness of breath, orthopnea, PND,  dizziness, presyncope, syncope, snoring, daytime somnolence, bleeding, or neurologic sequela..The patient is tolerating medications without difficulties and is otherwise without complaint today.    Past Medical History:  Diagnosis Date  . Actinic keratosis   . BPH with urinary obstruction   . Bradycardia   . CAD (coronary artery disease)    CABG x5  . Cerebrovascular accident (stroke) ([place])    With right hemiparesis and persistent expressive aphasia  . GERD  (gastroesophageal reflux disease)   . History of hydronephrosis   . Hx of hydronephrosis 07/08/2011   right   . Hyperlipidemia   . Hypertension   . Iron deficiency anemia due to chronic blood loss 03/26/2007   Now resolved    . Iron deficiency anemia secondary to blood loss (chronic)   . Persistent atrial fibrillation ([place])   . Pulmonary hypertension ([place])    by echo 2013  . Rosacea   . Situational depression    Severe  . Tricuspid regurgitation    Past Surgical History:  Procedure Laterality Date  . ABDOMINAL SURGERY    . BACK SURGERY    . BAND HEMORRHOIDECTOMY    . CHOLECYSTECTOMY    . CORONARY ARTERY BYPASS GRAFT     x 5  . dental implants    . HERNIA REPAIR    . KIDNEY SURGERY     stenosis with stent  . TONSILLECTOMY        Allergies  Allergen Reactions  . Ciprofloxacin Hcl     unknown  . Sulfamethoxazole-Trimethoprim     unknown    Social History   Socioeconomic History  . Marital status: Married    Spouse name: Not on file  . Number of children: Not on file  . Years of education: Not on file  . Highest education level: Not on file  Social Needs  . Financial resource strain: Not on file  . Food insecurity - worry:  Not on file  . Food insecurity - inability: Not on file  . Transportation needs - medical: Not on file  . Transportation needs - non-medical: Not on file  Occupational History  . Not on file  Tobacco Use  . Smoking status: Former Research scientist (life sciences)  . Smokeless tobacco: Never Used  Substance and Sexual Activity  . Alcohol use: No  . Drug use: No  . Sexual activity: Not Currently  Other Topics Concern  . Not on file  Social History Narrative   Lives in [place] with spouse. LIves at a house at a retirement home. 3 children. 6 grandkids. 5 greatgrandkids.       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 Heart attack Unknown        fhx  .  Coronary artery disease Unknown        fhx  . Heart disease Father    The patient does not have a history of early familial atrial fibrillation or other arrhythmias.  ROS- All systems are reviewed and negative except as per the HPI above.  Physical Exam: BP 152/78 HR 57 bpm,  GEN- The patient is well appearing, alert and oriented x 3 today.   Head- normocephalic, atraumatic Eyes-  Sclera clear, conjunctiva pink Ears- hearing intact Oropharynx- clear Neck- supple, no JVP Lymph- no cervical lymphadenopathy Lungs- Clear to ausculation bilaterally, normal work of breathing Heart- Irregular rate and rhythm, no murmurs, rubs or gallops, PMI not laterally displaced GI- soft, NT, ND, + BS Extremities- no clubbing, cyanosis, or trace edema on rt, 1t on left (prior cabg) MS- no significant deformity or atrophy Skin- no rash or lesion Psych- euthymic mood, full affect Neuro- strength and sensation are intact  EKG afib @ 57 bpm,  LAD, qrs int 102 ms, qtc, 430 ms  IRBBB Holter monitor 02/2016-Study Highlights   Atrial fibrillation with controlled ventricular rates Average heart rate 58 bpm, max heart rate 98 bpm Nocturnal pauses of up to 3.2 seconds are noted,  No daytime pauses Occasional premature ventricular contractions with rare nonsustained ventricular tachycardia      Assessment and Plan:  1. Atrial fibrillation The patient has asymptomatic longstanding persistent atrial fibrillation with slow ventricular response Monitor placed in January did  not show indication for PPM..  He  is  appropriately anticoagulated at this time.    2. Previous CVA with aphasia Continue Eliquis with chadsvasc of at least 6. Last creat stable at 1.4, repeat bmet today to see if still qualifies to continue with Eliquis 5 mg bid Steady on feet, no falls.   F/u in 9 months  Sooner if needed  Butch Penny C. Jlen Wintle, [facility] [address] [place], [place]  [phone]

## 2017-04-20 ENCOUNTER — Other Ambulatory Visit: Payer: Self-pay | Admitting: Family Medicine

## 2017-04-20 ENCOUNTER — Telehealth: Payer: Self-pay | Admitting: Radiology

## 2017-04-20 NOTE — Telephone Encounter (Signed)
Copied from [place]. Topic: General - Other >> Apr 17, 2017 12:34 PM Bea Graff, NT wrote: Reason for CRM: Pts daughter [name] calling to see if the paperwork has been completed that she dropped off for her dad to be able to drive. Please advise.  >> Apr 20, 2017 11:52 AM Arletha Grippe wrote: Daughter calling to check on status of paperwork - please call [phone]

## 2017-04-25 ENCOUNTER — Other Ambulatory Visit: Payer: Self-pay | Admitting: Family Medicine

## 2017-04-25 NOTE — Telephone Encounter (Signed)
Called and left a voicemail message asking daughter to schedule an appointment with a different provider to have dad re-evaluated. The paperwork is in the top file holder at my desk. I did state in the voicemail that Dr. Yong Channel is currently out of the office due to his wife being in labor.

## 2017-04-26 DIAGNOSIS — I639 Cerebral infarction, unspecified: Secondary | ICD-10-CM | POA: Diagnosis not present

## 2017-04-26 DIAGNOSIS — H353133 Nonexudative age-related macular degeneration, bilateral, advanced atrophic without subfoveal involvement: Secondary | ICD-10-CM | POA: Diagnosis not present

## 2017-04-27 ENCOUNTER — Encounter: Payer: Self-pay | Admitting: Family Medicine

## 2017-04-27 ENCOUNTER — Ambulatory Visit: Payer: Medicare Other | Admitting: Family Medicine

## 2017-04-27 VITALS — BP 122/82 | HR 60 | Temp 98.2°F | Ht 72.0 in | Wt 201.0 lb

## 2017-04-27 DIAGNOSIS — Z0289 Encounter for other administrative examinations: Secondary | ICD-10-CM

## 2017-04-27 NOTE — Progress Notes (Signed)
Patient presented to the office today to have forms completed for the [facility] that state that he is safe to drive.  Patient's daughter stated that the forms were dropped off about a month ago.  These forms were last completed by his PCP over 2 years ago.  Patient's daughter is with him.  States that they have in extension until April 9 to have these forms completed.  States that his eye doctor and cardiologist have both filled out their forms. Patient has a history of stroke with right-sided hemiparesis as well as a aphasia.  Patient and daughter were told that would be ideal for these forms to be completed by his PCP, especially considering that he will be back from his paternity leave prior to the due date.Patient and daughter agreed to this plan.  They will return in a few weeks for a visit with his PCP to have these forms completed.  Algis Greenhouse. Jerline Pain, MD 04/27/2017 10:34 AM

## 2017-04-27 NOTE — Patient Instructions (Signed)
Please schedule an appointment on March 25th with Dr Yong Channel.  Take care, Dr Jerline Pain

## 2017-04-30 IMAGING — US US SCROTUM
1 series · 14 of 25 positions shown · non-contrast
Comparison: None.

CLINICAL DATA: Right testicular pain and swelling for least 2
weeks. Nonverbal after CVA.

EXAM:
SCROTAL ULTRASOUND
DOPPLER ULTRASOUND OF THE TESTICLES
TECHNIQUE: Complete ultrasound examination of the testicles, epididymis, and
other scrotal structures was performed. Color and spectral Doppler
ultrasound were also utilized to evaluate blood flow to the
testicles.

[Series 1: us scrotum · 14 of 45 slices shown]
[im 1/45]
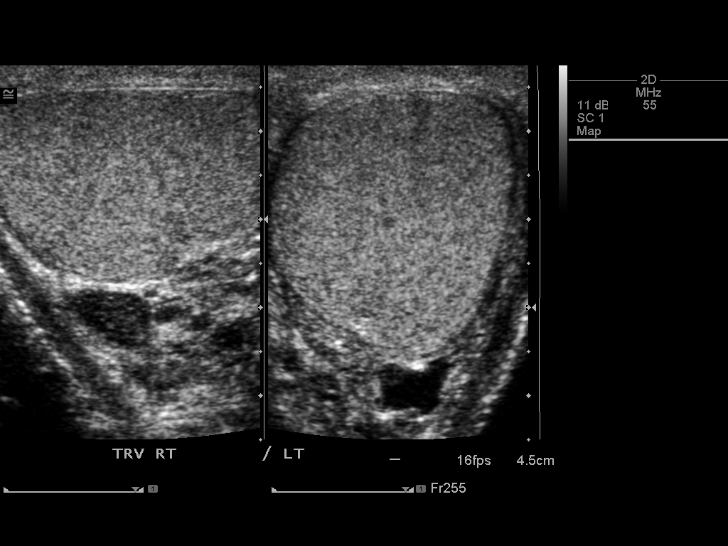
[im 4/45]
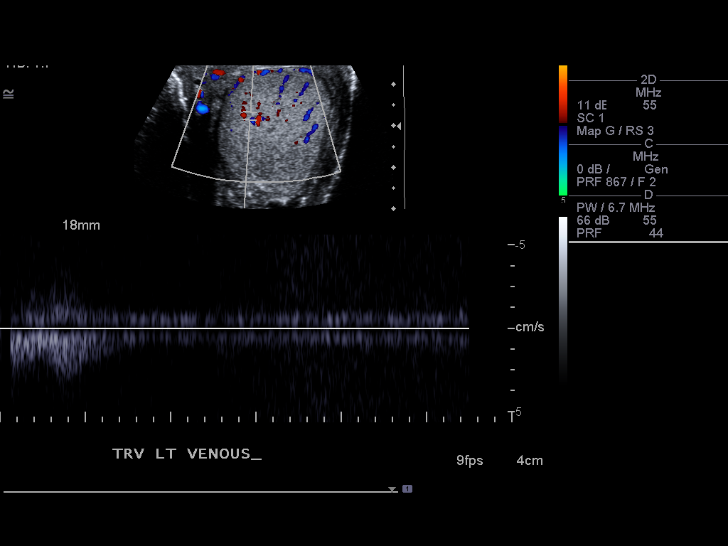
[im 8/45]
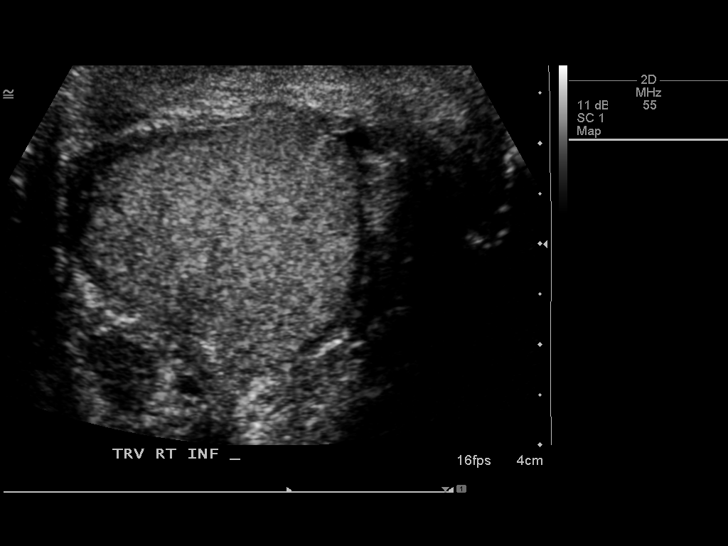
[im 12/45]
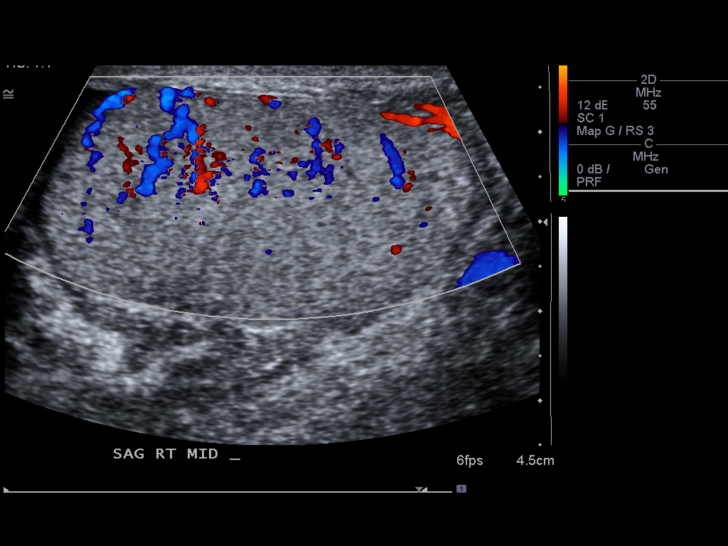
[im 15/45]
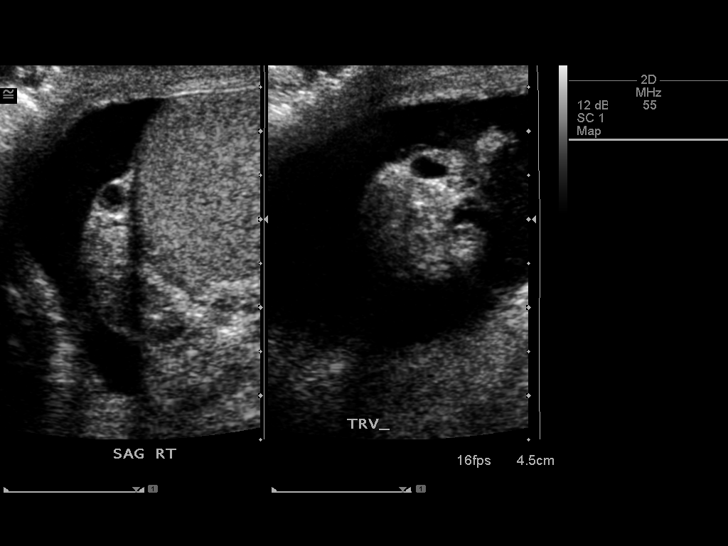
[im 17/45]
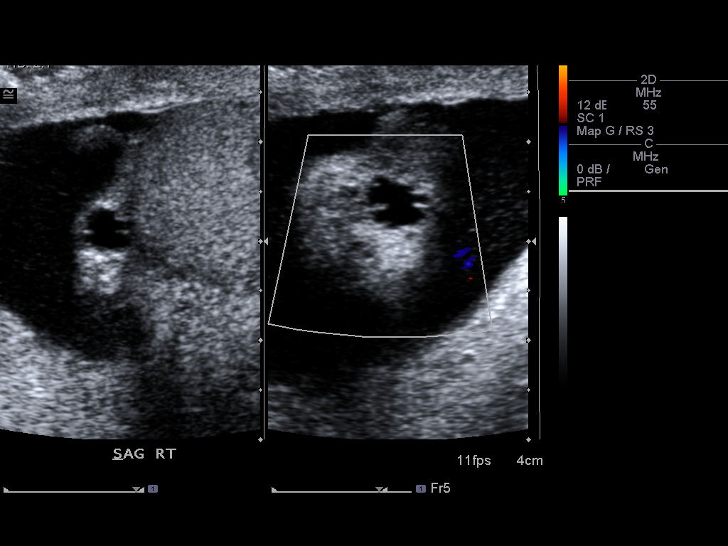
[im 21/45]
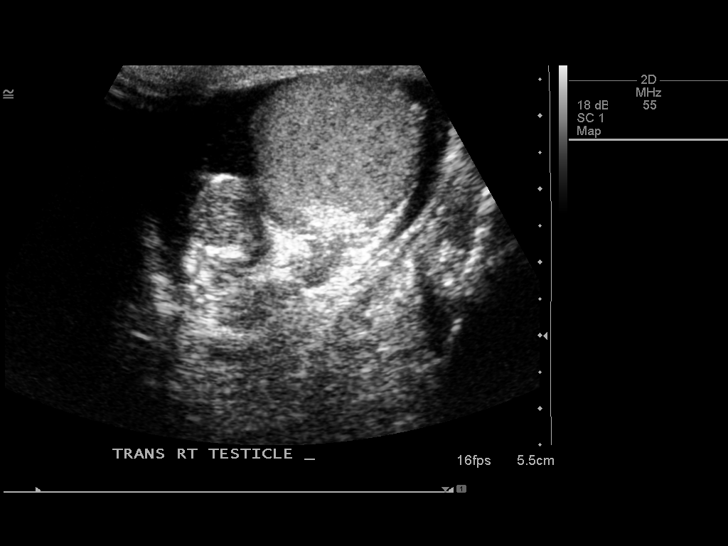
[im 24/45]
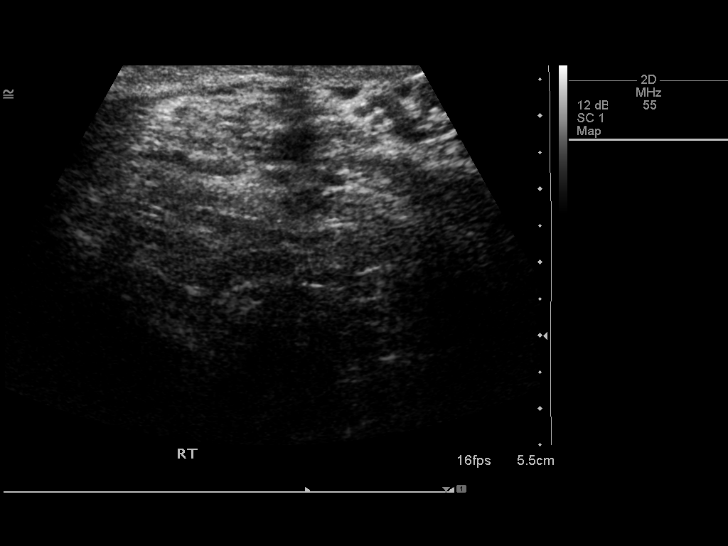
[im 28/45]
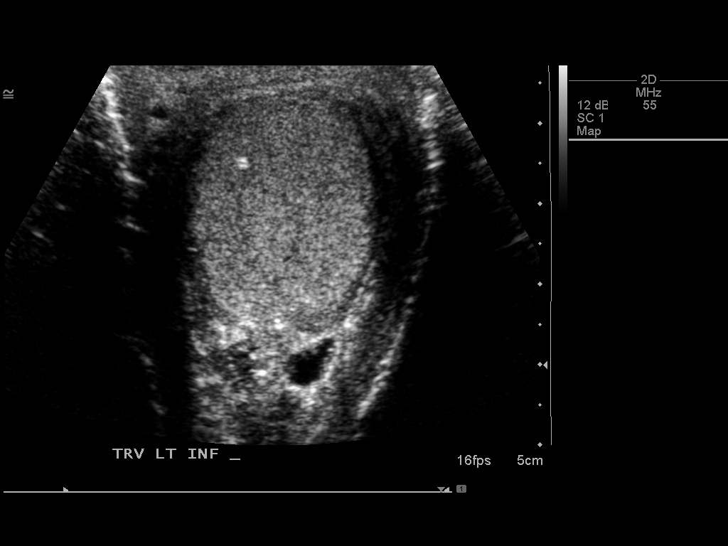
[im 30/45]
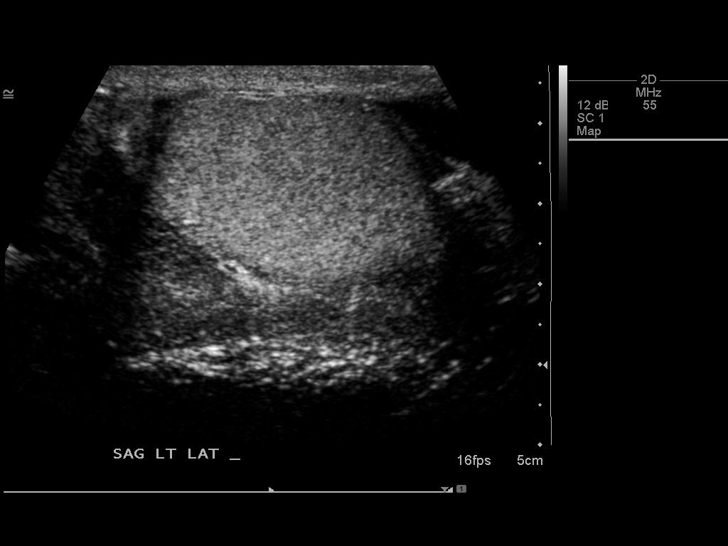
[im 34/45]
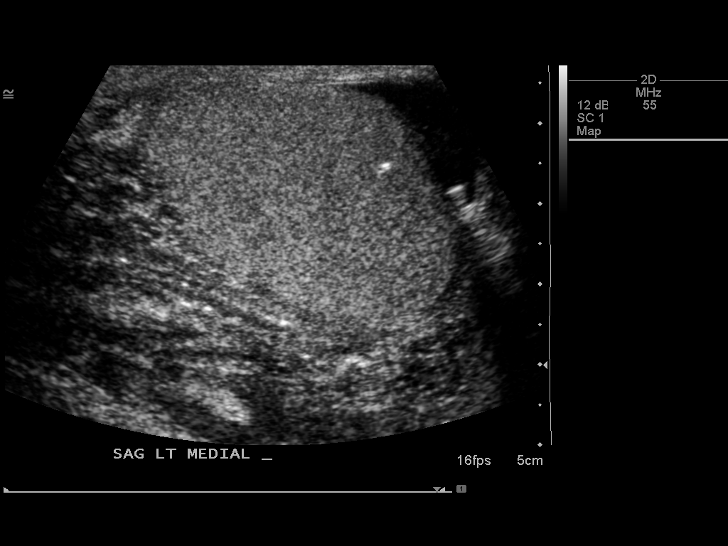
[im 37/45]
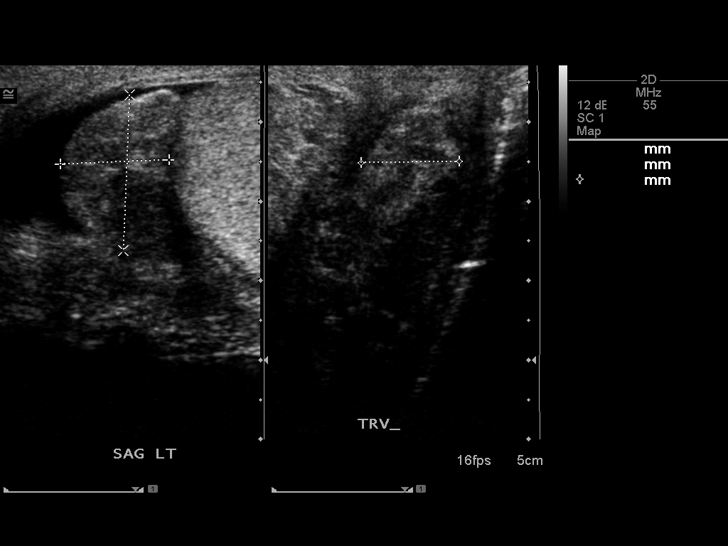
[im 41/45]
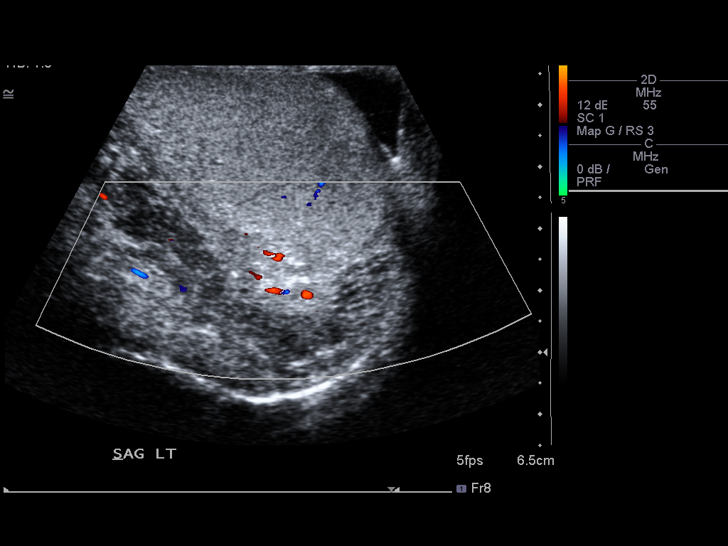
[im 45/45]
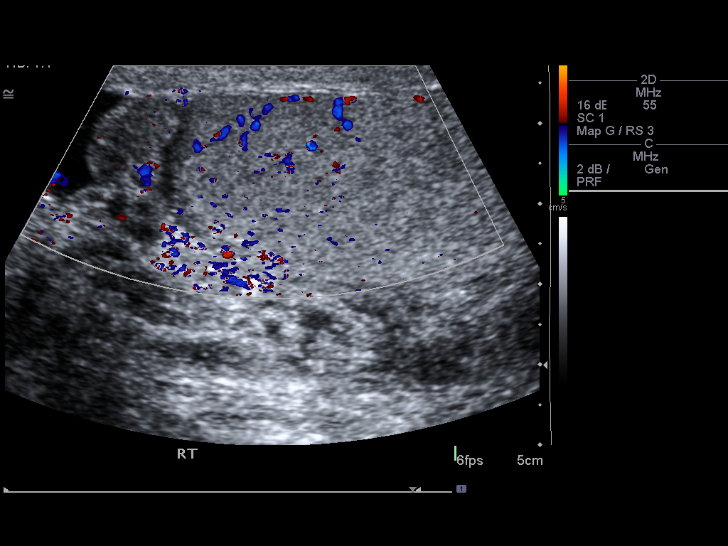

[14 of 25 positions shown; findings below may reference images not displayed]

FINDINGS: Right testicle

Measurements: 49 x 26 x 31 mm. No mass or microlithiasis visualized.

Left testicle

Measurements: 47 x 31 x 24 mm. Limited microlithiasis. Negative for
mass.

Right epididymis: Incidental cyst within the head measuring up to 7
mm. No generalized enlargement or asymmetric vascularity.

Left epididymis:  Normal in size and appearance.

Hydrocele:  Small simple fluid on the right

Varicocele:  None visualized.

Pulsed Doppler interrogation of both testes demonstrates normal low
resistance arterial and venous waveforms bilaterally.
IMPRESSION: 1. No acute finding.
2. Small right hydrocele. No other explanation for right-sided
swelling.

## 2017-05-03 ENCOUNTER — Other Ambulatory Visit: Payer: Self-pay | Admitting: Family Medicine

## 2017-05-21 ENCOUNTER — Ambulatory Visit: Payer: Medicare Other | Admitting: Family Medicine

## 2017-05-21 ENCOUNTER — Encounter: Payer: Self-pay | Admitting: Family Medicine

## 2017-05-21 VITALS — BP 138/74 | HR 63 | Temp 98.1°F | Ht 72.0 in | Wt 199.2 lb

## 2017-05-21 DIAGNOSIS — I69351 Hemiplegia and hemiparesis following cerebral infarction affecting right dominant side: Secondary | ICD-10-CM

## 2017-05-21 DIAGNOSIS — R413 Other amnesia: Secondary | ICD-10-CM | POA: Diagnosis not present

## 2017-05-21 DIAGNOSIS — I10 Essential (primary) hypertension: Secondary | ICD-10-CM | POA: Diagnosis not present

## 2017-05-21 DIAGNOSIS — F325 Major depressive disorder, single episode, in full remission: Secondary | ICD-10-CM

## 2017-05-21 DIAGNOSIS — I2510 Atherosclerotic heart disease of native coronary artery without angina pectoris: Secondary | ICD-10-CM

## 2017-05-21 DIAGNOSIS — E785 Hyperlipidemia, unspecified: Secondary | ICD-10-CM

## 2017-05-21 DIAGNOSIS — I482 Chronic atrial fibrillation, unspecified: Secondary | ICD-10-CM

## 2017-05-21 NOTE — Assessment & Plan Note (Signed)
Last LDL 66 06/01/16- repeat full lipids if he comes in fasting

## 2017-05-21 NOTE — Patient Instructions (Signed)
No changes today- 3-6 months for physical  Thanks for coming in to discuss the forms

## 2017-05-21 NOTE — Assessment & Plan Note (Signed)
Continued garbled speech after stroke. May have vascular dementia mild- continue namenda which Dr. Arnoldo Morale started years ago and he has tolerated well

## 2017-05-21 NOTE — Progress Notes (Signed)
Subjective:  [name] is a 84 y.o. year old very pleasant male patient who presents for/with See problem oriented charting ROS- no reported-hest pain or shortness of breath. No headache or blurry vision.     Past Medical History-  Patient Active Problem List   Diagnosis Date Noted  . Memory loss 07/21/2014    Priority: High  . Chronic atrial fibrillation ([place]) 07/08/2011    Priority: High  . Hemiparesis affecting right side as late effect of cerebrovascular accident ([place]) 03/26/2007    Priority: High  . CAD (coronary artery disease) 08/23/2006    Priority: High  . Venous insufficiency 10/02/2016    Priority: Medium  . Right hydrocele 04/03/2016    Priority: Medium  . Thrombocytopenia ([place]) 11/22/2015    Priority: Medium  . CKD (chronic kidney disease), stage III ([place]) 11/22/2015    Priority: Medium  . Bradycardia 07/09/2011    Priority: Medium  . Major depression in remission ([place]) 07/26/2007    Priority: Medium  . 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    Priority: Medium  . Hyperlipidemia 08/23/2006    Priority: Medium  . Essential hypertension 08/23/2006    Priority: Medium  . Actinic keratosis 07/30/2009    Priority: Low  . GERD 08/23/2006    Priority: Low  . Left wrist pain 11/01/2016    Medications- reviewed and updated Current Outpatient Medications  Medication Sig Dispense Refill  . amLODipine (NORVASC) 2.5 MG tablet TAKE 1 TABLET(2.5 MG) BY MOUTH DAILY 90 tablet 3  . apixaban (ELIQUIS) 5 MG TABS tablet Take 1 tablet (5 mg total) by mouth 2 (two) times daily. 180 tablet 1  . escitalopram (LEXAPRO) 20 MG tablet TAKE 1 TABLET(20 MG) BY MOUTH DAILY 90 tablet 1  . Lansoprazole (PREVACID PO) Take 15 mg by mouth daily.    . memantine (NAMENDA) 10 MG tablet TAKE 1 TABLET(10 MG) BY MOUTH DAILY 90 tablet 1  . Multiple Vitamin (MULTIVITAMIN) tablet Take 1 tablet by mouth daily.      . rosuvastatin (CRESTOR) 20 MG tablet TAKE 1 TABLET(20 MG)  BY MOUTH DAILY 90 tablet 3  . tamsulosin (FLOMAX) 0.4 MG CAPS capsule TAKE 1 CAPSULE(0.4 MG) BY MOUTH AT BEDTIME 90 capsule 5  . triamterene-hydrochlorothiazide (MAXZIDE-25) 37.5-25 MG tablet TAKE 1 TABLET BY MOUTH EVERY MORNING 90 tablet 1   No current facility-administered medications for this visit.    Objective: BP 138/74 (BP Location: Left Arm, Patient Position: Sitting, Cuff Size: Normal)   Pulse 63   Temp 98.1 F (36.7 C) (Oral)   Ht 6' (1.829 m)   Wt 199 lb 3.2 oz (90.4 kg)   SpO2 98%   BMI 27.02 kg/m  Gen: NAD, resting comfortably CV: RRR  Lungs: nonlabored Ext: 1+ edema Neuro: garbled speech. 4+/5 RLE strenght, 4/5 right arm and grip strength. Normal on left  Assessment/Plan:  Form for DMV- completed- will be scanned in  CAD (coronary artery disease) S: S/p CABG x5 1995. Crestor 20mg  and eliquis compliant A/P: no chest pain- continue current meds   Chronic atrial fibrillation S: anticoagulated with eliquis 5 mg BID. Has had rate control without beta blocker A/P: continue current medicine- usually appears to be in a fib- not confirmed with EKG today   Hemiparesis affecting right side as late effect of cerebrovascular accident [facility]) S: 4+5 lower extremity and 4/5 upper right extremity.  A/P: He has done well with driving with stroke in 2003 and has shown no signs of new  stroke or worsening memory loss from likely mild vascular dementia from that stroke- He has driving restrictions of 10 miles within home and 35mph roads- and mainly goes to pharmacy and grocery store. Daughter rides with him frequently. No accidents. Daughter tracks where he is with an app- I think he can safely continue driving given no recent change- filled out forms- recheck every 2 years reasonable  Memory loss Continued garbled speech after stroke. May have vascular dementia mild- continue namenda which Dr. Arnoldo Morale started years ago and he has tolerated well  Essential hypertension S:  controlled on triamterene hctz 37.5-25mg  and amlodipine 2.5mg  BP Readings from Last 3 Encounters:  05/21/17 138/74  04/27/17 122/82  03/27/17 (!) 152/78  A/P: Continue current meds:  Would prefer BP <130/90 but will monitor only as long as at blood pressure goal of <140/90   Major depression in remission S: depression well controlled on lexapro 20mg  with phq9 of 1 A/P: continue current medications.   Hyperlipidemia Last LDL 66 06/01/16- repeat full lipids if he comes in fasting  Future Appointments  Date Time Provider [place]  11/21/2017 11:00 AM [name], MD [facility]  encouraged him to schedule CPE  Return precautions advised.  [name], MD

## 2017-05-21 NOTE — Assessment & Plan Note (Signed)
S: S/p CABG x5 1995. Crestor 20mg  and eliquis compliant A/P: no chest pain- continue current meds

## 2017-05-21 NOTE — Assessment & Plan Note (Signed)
S: depression well controlled on lexapro 20mg  with phq9 of 1 A/P: continue current medications.

## 2017-05-21 NOTE — Assessment & Plan Note (Signed)
S: controlled on triamterene hctz 37.5-25mg  and amlodipine 2.5mg  BP Readings from Last 3 Encounters:  05/21/17 138/74  04/27/17 122/82  03/27/17 (!) 152/78  A/P: Continue current meds:  Would prefer BP <130/90 but will monitor only as long as at blood pressure goal of <140/90

## 2017-05-21 NOTE — Assessment & Plan Note (Signed)
S: anticoagulated with eliquis 5 mg BID. Has had rate control without beta blocker A/P: continue current medicine- usually appears to be in a fib- not confirmed with EKG today

## 2017-05-21 NOTE — Assessment & Plan Note (Signed)
S: 4+5 lower extremity and 4/5 upper right extremity.  A/P: He has done well with driving with stroke in 2003 and has shown no signs of new stroke or worsening memory loss from likely mild vascular dementia from that stroke- He has driving restrictions of 10 miles within home and 43mph roads- and mainly goes to pharmacy and grocery store. Daughter rides with him frequently. No accidents. Daughter tracks where he is with an app- I think he can safely continue driving given no recent change- filled out forms- recheck every 2 years reasonable

## 2017-06-26 ENCOUNTER — Other Ambulatory Visit: Payer: Self-pay | Admitting: Family Medicine

## 2017-08-13 ENCOUNTER — Encounter: Payer: Self-pay | Admitting: Family Medicine

## 2017-08-13 ENCOUNTER — Ambulatory Visit: Payer: Medicare Other | Admitting: Family Medicine

## 2017-08-13 VITALS — BP 118/82 | HR 60 | Temp 98.7°F | Ht 72.0 in | Wt 200.0 lb

## 2017-08-13 DIAGNOSIS — R300 Dysuria: Secondary | ICD-10-CM

## 2017-08-13 DIAGNOSIS — N433 Hydrocele, unspecified: Secondary | ICD-10-CM | POA: Diagnosis not present

## 2017-08-13 LAB — POCT URINALYSIS DIPSTICK
Bilirubin, UA: NEGATIVE
Blood, UA: NEGATIVE
GLUCOSE UA: NEGATIVE
Ketones, UA: NEGATIVE
LEUKOCYTES UA: NEGATIVE
NITRITE UA: NEGATIVE
PROTEIN UA: POSITIVE — AB
Urobilinogen, UA: 0.2 E.U./dL
pH, UA: 5.5 (ref 5.0–8.0)

## 2017-08-13 NOTE — Progress Notes (Signed)
Subjective:  [name] is a 84 y.o. year old very pleasant male patient who presents for/with See problem oriented charting ROS-level 5 caveat applies due to difficulty with speech after stroke.  Patient does shake his head no to chest pain, shortness of breath, headache, blurry vision, burning with peeing, frequent urination.  Unclear how much discomfort he has due to the enlarged scrotum Past Medical History-  Patient Active Problem List   Diagnosis Date Noted  . Memory loss 07/21/2014    Priority: High  . Chronic atrial fibrillation ([place]) 07/08/2011    Priority: High  . Hemiparesis affecting right side as late effect of cerebrovascular accident ([place]) 03/26/2007    Priority: High  . CAD (coronary artery disease) 08/23/2006    Priority: High  . Venous insufficiency 10/02/2016    Priority: Medium  . Right hydrocele 04/03/2016    Priority: Medium  . Thrombocytopenia ([place]) 11/22/2015    Priority: Medium  . CKD (chronic kidney disease), stage III ([place]) 11/22/2015    Priority: Medium  . Bradycardia 07/09/2011    Priority: Medium  . Major depression in remission ([place]) 07/26/2007    Priority: Medium  . 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    Priority: Medium  . Hyperlipidemia 08/23/2006    Priority: Medium  . Essential hypertension 08/23/2006    Priority: Medium  . Actinic keratosis 07/30/2009    Priority: Low  . GERD 08/23/2006    Priority: Low  . Left wrist pain 11/01/2016    Medications- reviewed and updated Current Outpatient Medications  Medication Sig Dispense Refill  . amLODipine (NORVASC) 2.5 MG tablet TAKE 1 TABLET(2.5 MG) BY MOUTH DAILY 90 tablet 3  . ELIQUIS 5 MG TABS tablet TAKE 1 TABLET(5 MG) BY MOUTH TWICE DAILY 180 tablet 1  . escitalopram (LEXAPRO) 20 MG tablet TAKE 1 TABLET(20 MG) BY MOUTH DAILY 90 tablet 1  . Lansoprazole (PREVACID PO) Take 15 mg by mouth daily.    . memantine (NAMENDA) 10 MG tablet TAKE 1 TABLET(10 MG) BY MOUTH  DAILY 90 tablet 1  . Multiple Vitamin (MULTIVITAMIN) tablet Take 1 tablet by mouth daily.      . rosuvastatin (CRESTOR) 20 MG tablet TAKE 1 TABLET(20 MG) BY MOUTH DAILY 90 tablet 3  . tamsulosin (FLOMAX) 0.4 MG CAPS capsule TAKE 1 CAPSULE(0.4 MG) BY MOUTH AT BEDTIME 90 capsule 5  . triamterene-hydrochlorothiazide (MAXZIDE-25) 37.5-25 MG tablet TAKE 1 TABLET BY MOUTH EVERY MORNING 90 tablet 1   No current facility-administered medications for this visit.     Objective: BP 118/82 (BP Location: Left Arm, Patient Position: Sitting, Cuff Size: Normal)   Pulse 60   Temp 98.7 F (37.1 C) (Oral)   Ht 6' (1.829 m)   Wt 200 lb (90.7 kg)   SpO2 97%   BMI 27.12 kg/m  Gen: NAD, resting comfortably CV: RRR no murmurs rubs or gallops Lungs: CTAB no crackles, wheeze, rhonchi Abdomen: soft/nontender/nondistended/normal bowel sounds.  Ext: no edema Skin: warm, dry GU: Uncircumcised penis with normal appearance, significantly enlarged right scrotum-not tender to touch.  Fluid noted around testicle on the right-difficult to fully palpate testicle.  Normal left testicle Results for orders placed or performed in visit on 08/13/17 (from the past 24 hour(s))  POCT urinalysis dipstick     Status: Abnormal   Collection Time: 08/13/17  4:44 PM  Result Value Ref Range   Color, UA Yellow    Clarity, UA Clear    Glucose, UA Negative Negative  Bilirubin, UA Negative    Ketones, UA Negative    Spec Grav, UA >=1.030 (A) 1.010 - 1.025   Blood, UA Negative    pH, UA 5.5 5.0 - 8.0   Protein, UA Positive (A) Negative   Urobilinogen, UA 0.2 0.2 or 1.0 E.U./dL   Nitrite, UA Negative    Leukocytes, UA Negative Negative   Appearance     Odor      Assessment/Plan:  Right hydrocele S: Spent time on the phone with patient's wife who was not present today.  Patient's wife originally told our nursing staff that patient was having some burning with peeing.  I clarified with the wife who states every time he goes  into urinate he comes out pointing at his genital region and seeming upset.  The wife assumed he was in discomfort.  He did not want her to come to the visit today.  It is not clear to me if patient is having significant discomfort but wife does not think he is. A/P: On exam- enlarged right scrotum-consistent with prior hydrocele though likely enlarged from last visit.  I offered patient referral to urology and discussed possibility of surgery-he adamantly shakes his head no to this idea.  I also discussed this with the wife on the phone-she states patient would not want surgery.  They are okay monitoring.  I did tell patient and wife that if he was having significant discomfort we could refer to urology in the future- wife understands and patient seems to.  In regards to the urine-no signs of infection.   Future Appointments  Date Time Provider [place]  11/21/2017 11:00 AM [name], MD [facility]   Lab/Order associations: Dysuria - Plan: POCT urinalysis dipstick  Fullness of scrotum  Right hydrocele  Time Stamp The duration of face-to-face time during this visit was greater than 15 minutes. Greater than 50% of this time was spent in counseling, explanation of diagnosis, planning of further management, and/or coordination of care including discussing options for hydrocele, going over prior ultrasound, reviewing issues with both wife and patient. .    Return precautions advised.  [name], MD

## 2017-08-13 NOTE — Patient Instructions (Addendum)
Health Maintenance Due  Topic Date Due  . TETANUS/TDAP - Informed patient to have it done at his pharmacy. 02/28/2015   Right hydrocele causing the swelling you have noted. Your urine was normal today. If you change your mind and want to discuss with surgeon- we can refer you to urology. Just let me know     Hydrocele, Adult A hydrocele is a collection of fluid in the loose pouch of skin that holds the testicles (scrotum). Usually, it affects only one testicle. What are the causes? This condition may be caused by:  An injury to the scrotum.  An infection.  A tumor or cancer of the testicle.  Twisting of a testicle.  Decreased blood flow to the scrotum.  What are the signs or symptoms? A hydrocele feels like a water-filled balloon. It may also feel heavy. A hydrocele can cause:  Swelling of the scrotum. The swelling may decrease when you lie down.  Swelling of the groin.  Mild discomfort in the scrotum.  Pain. This can develop if the hydrocele was caused by infection or twisting.  How is this diagnosed? This condition may be diagnosed with a medical history, physical exam, and imaging tests. You may also have blood and urine tests to check for infection. How is this treated? Treatment may include:  Watching and waiting, particularly if the hydrocele causes no symptoms.  Treatment of the underlying condition. This may include using antibiotic medicine.  Surgery to drain the fluid. Some surgical options include: ? Needle aspiration. For this procedure, a needle is used to drain fluid. ? Hydrocelectomy. For this procedure, an incision is made in the scrotum to remove the fluid sac.  Follow these instructions at home:  Keep all follow-up visits as told by your health care provider. This is important.  Watch the hydrocele for any changes.  Take over-the-counter and prescription medicines only as told by your health care provider.  If you were prescribed an antibiotic  medicine, use it as told by your health care provider. Do not stop using the antibiotic even if your condition improves. Contact a health care provider if:  The swelling in your scrotum or groin gets worse.  The hydrocele becomes red, firm, tender to the touch, or painful.  You notice any changes in the hydrocele.  You have a fever. This information is not intended to replace advice given to you by your health care provider. Make sure you discuss any questions you have with your health care provider. Document Released: 08/03/2009 Document Revised: 07/22/2015 Document Reviewed: 02/09/2014 Elsevier Interactive Patient Education  [employer].

## 2017-08-13 NOTE — Assessment & Plan Note (Signed)
S: Spent time on the phone with patient's wife who was not present today.  Patient's wife originally told our nursing staff that patient was having some burning with peeing.  I clarified with the wife who states every time he goes into urinate he comes out pointing at his genital region and seeming upset.  The wife assumed he was in discomfort.  He did not want her to come to the visit today.  It is not clear to me if patient is having significant discomfort but wife does not think he is. A/P: On exam- enlarged right scrotum-consistent with prior hydrocele though likely enlarged from last visit.  I offered patient referral to urology and discussed possibility of surgery-he adamantly shakes his head no to this idea.  I also discussed this with the wife on the phone-she states patient would not want surgery.  They are okay monitoring.  I did tell patient and wife that if he was having significant discomfort we could refer to urology in the future- wife understands and patient seems to.  In regards to the urine-no signs of infection.

## 2017-10-01 ENCOUNTER — Ambulatory Visit (INDEPENDENT_AMBULATORY_CARE_PROVIDER_SITE_OTHER): Payer: Medicare Other | Admitting: Physician Assistant

## 2017-10-01 ENCOUNTER — Ambulatory Visit (HOSPITAL_COMMUNITY)
Admission: RE | Admit: 2017-10-01 | Discharge: 2017-10-01 | Disposition: A | Discharge status: Home / Self Care | Payer: Medicare Other | Source: Ambulatory Visit | Attending: Cardiovascular Disease | Admitting: Cardiovascular Disease

## 2017-10-01 ENCOUNTER — Encounter: Payer: Self-pay | Admitting: Physician Assistant

## 2017-10-01 VITALS — BP 124/60 | HR 58 | Temp 98.2°F | Ht 72.0 in | Wt 203.0 lb

## 2017-10-01 DIAGNOSIS — R109 Unspecified abdominal pain: Secondary | ICD-10-CM | POA: Diagnosis not present

## 2017-10-01 DIAGNOSIS — R600 Localized edema: Secondary | ICD-10-CM | POA: Insufficient documentation

## 2017-10-01 LAB — URINALYSIS, ROUTINE W REFLEX MICROSCOPIC
BILIRUBIN URINE: NEGATIVE
Hgb urine dipstick: NEGATIVE
Leukocytes, UA: NEGATIVE
Nitrite: NEGATIVE
PH: 6 (ref 5.0–8.0)
RBC / HPF: NONE SEEN (ref 0–?)
Specific Gravity, Urine: 1.025 (ref 1.000–1.030)
TOTAL PROTEIN, URINE-UPE24: 100 — AB
URINE GLUCOSE: NEGATIVE
UROBILINOGEN UA: 0.2 (ref 0.0–1.0)

## 2017-10-01 LAB — BASIC METABOLIC PANEL
BUN: 30 mg/dL — AB (ref 6–23)
CALCIUM: 9.2 mg/dL (ref 8.4–10.5)
CO2: 29 mEq/L (ref 19–32)
CREATININE: 1.76 mg/dL — AB (ref 0.40–1.50)
Chloride: 97 mEq/L (ref 96–112)
GFR: 39.13 mL/min — AB (ref >60.00)
Glucose, Bld: 118 mg/dL — ABNORMAL HIGH (ref 70–99)
Potassium: 5.2 mEq/L — ABNORMAL HIGH (ref 3.5–5.1)
Sodium: 134 mEq/L — ABNORMAL LOW (ref 135–145)

## 2017-10-01 NOTE — Progress Notes (Signed)
[name] is a 84 y.o. male here for a new problem.  I acted as a Education administrator for [employer], PA-C Anselmo Pickler, LPN  History of Present Illness:   Chief Complaint  Patient presents with  . Back Pain    Back Pain  This is a new problem. The current episode started 1 to 4 weeks ago (Pt said he fell 4 hours ago at home on back and has pain right flank area with bruising). The problem has been gradually worsening since onset. Pain location: Right Flank area  The quality of the pain is described as aching. The pain is at a severity of 7/10. The pain is moderate. The pain is the same all the time. The symptoms are aggravated by position. Associated symptoms include leg pain. He has tried nothing (put and wrap around abdomen and back) for the symptoms.   Discussed patient's symptoms with daughter, Dolores Lory, over the phone. Patient had an unwitnessed fall 2 weeks ago. She does not think that he hit his head. Has had intermittent swelling of his entire R leg since that time. He is currently on Eliquis and reports that he is taking as prescribed. Denies chest pain, SOB, palpitations.   Past Medical History:  Diagnosis Date  . Actinic keratosis   . BPH with urinary obstruction   . Bradycardia   . CAD (coronary artery disease)    CABG x5  . Cerebrovascular accident (stroke) ([place])    With right hemiparesis and persistent expressive aphasia  . GERD (gastroesophageal reflux disease)   . History of hydronephrosis   . Hx of hydronephrosis 07/08/2011   right   . Hyperlipidemia   . Hypertension   . Iron deficiency anemia due to chronic blood loss 03/26/2007   Now resolved    . Iron deficiency anemia secondary to blood loss (chronic)   . Persistent atrial fibrillation ([place])   . Pulmonary hypertension ([place])    by echo 2013  . Rosacea   . Situational depression    Severe  . Tricuspid regurgitation      Social History   Socioeconomic History  . Marital status: Married    Spouse name: Not  on file  . Number of children: Not on file  . Years of education: Not on file  . Highest education level: Not on file  Occupational History  . Not on file  Social Needs  . Financial resource strain: Not on file  . Food insecurity:    Worry: Not on file    Inability: Not on file  . Transportation needs:    Medical: Not on file    Non-medical: Not on file  Tobacco Use  . Smoking status: Former Research scientist (life sciences)  . Smokeless tobacco: Never Used  Substance and Sexual Activity  . Alcohol use: No  . Drug use: No  . Sexual activity: Not Currently  Lifestyle  . Physical activity:    Days per week: Not on file    Minutes per session: Not on file  . Stress: Not on file  Relationships  . Social connections:    Talks on phone: Not on file    Gets together: Not on file    Attends religious service: Not on file    Active member of club or organization: Not on file    Attends meetings of clubs or organizations: Not on file    Relationship status: Not on file  . Intimate partner violence:    Fear of current or ex partner:  Not on file    Emotionally abused: Not on file    Physically abused: Not on file    Forced sexual activity: Not on file  Other Topics Concern  . Not on file  Social History Narrative   Lives in [place] with spouse. LIves at a house at a retirement home. 3 children. 6 grandkids. 5 greatgrandkids.       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 ABDOMINAL SURGERY    . BACK SURGERY    . BAND HEMORRHOIDECTOMY    . CHOLECYSTECTOMY    . CORONARY ARTERY BYPASS GRAFT     x 5  . dental implants    . HERNIA REPAIR    . KIDNEY SURGERY     stenosis with stent  . TONSILLECTOMY      Family History  Problem Relation Age of Onset  . Heart disease Father   . Heart attack Unknown        fhx  . Coronary artery disease Unknown        fhx    Allergies  Allergen  Reactions  . Ciprofloxacin Hcl     unknown  . Sulfamethoxazole-Trimethoprim     unknown    Current Medications:   Current Outpatient Medications:  .  amLODipine (NORVASC) 2.5 MG tablet, TAKE 1 TABLET(2.5 MG) BY MOUTH DAILY, Disp: 90 tablet, Rfl: 3 .  ELIQUIS 5 MG TABS tablet, TAKE 1 TABLET(5 MG) BY MOUTH TWICE DAILY, Disp: 180 tablet, Rfl: 1 .  escitalopram (LEXAPRO) 20 MG tablet, TAKE 1 TABLET(20 MG) BY MOUTH DAILY, Disp: 90 tablet, Rfl: 1 .  Lansoprazole (PREVACID PO), Take 15 mg by mouth daily., Disp: , Rfl:  .  memantine (NAMENDA) 10 MG tablet, TAKE 1 TABLET(10 MG) BY MOUTH DAILY, Disp: 90 tablet, Rfl: 1 .  Multiple Vitamin (MULTIVITAMIN) tablet, Take 1 tablet by mouth daily.  , Disp: , Rfl:  .  rosuvastatin (CRESTOR) 20 MG tablet, TAKE 1 TABLET(20 MG) BY MOUTH DAILY, Disp: 90 tablet, Rfl: 3 .  tamsulosin (FLOMAX) 0.4 MG CAPS capsule, TAKE 1 CAPSULE(0.4 MG) BY MOUTH AT BEDTIME, Disp: 90 capsule, Rfl: 5 .  triamterene-hydrochlorothiazide (MAXZIDE-25) 37.5-25 MG tablet, TAKE 1 TABLET BY MOUTH EVERY MORNING, Disp: 90 tablet, Rfl: 1   Review of Systems:   Review of Systems  Musculoskeletal: Positive for back pain.  Negative unless otherwise specified per HPI.  Vitals:   Vitals:   10/01/17 1343  BP: 124/60  Pulse: (!) 58  Temp: 98.2 F (36.8 C)  TempSrc: Oral  SpO2: 96%  Weight: 203 lb (92.1 kg)  Height: 6' (1.829 m)     Body mass index is 27.53 kg/m.  Physical Exam:   Physical Exam  Constitutional: He appears well-developed. He is cooperative.  Non-toxic appearance. He does not have a sickly appearance. He does not appear ill. No distress.  Cardiovascular: Normal rate, S1 normal, S2 normal, normal heart sounds and normal pulses. An irregularly irregular rhythm present.  Pulmonary/Chest: Effort normal and breath sounds normal.  Musculoskeletal:  Pitting edema to entire RLE, more so distally than proximally. Unable to assess if pain with palpation -- no obvious areas of  warmth/redness on exam.  Very mild bruising to R flank, slightly tender with palpation -- unable to assess if pain with palpation.  Neurological: He is alert. GCS eye subscore is 4. GCS verbal subscore is  5. GCS motor subscore is 6.  Skin: Skin is warm, dry and intact.  Psychiatric: He has a normal mood and affect. His speech is normal and behavior is normal. Cognition and memory are impaired.  Difficult to assess speech 2/2 significant aphasia  Nursing note and vitals reviewed.   Assessment and Plan:    Jamaar was seen today for back pain.  Diagnoses and all orders for this visit:  Flank pain UA obtained. Discussed Tylenol with patient. Follow-up with Dr. Yong Channel later this week. -     Urinalysis, Routine w reflex microscopic -     Basic metabolic panel  Localized edema Wells Criteria is 3 points. Will orders stat U/S. I discussed these results with patient's daughter, Dolores Lory, who came to the office to drive patient to have U/S performed. If any worsening symptoms, patient was advised to go to the ER.  -     Urinalysis, Routine w reflex microscopic -     Basic metabolic panel -     VAS [place] LOWER EXTREMITY VENOUS (DVT); Future   . Reviewed expectations re: course of current medical issues. . Discussed self-management of symptoms. . Outlined signs and symptoms indicating need for more acute intervention. . Patient verbalized understanding and all questions were answered. . See orders for this visit as documented in the electronic medical record. . Patient received an After-Visit Summary.  CMA or LPN served as scribe during this visit. History, Physical, and Plan performed by medical provider. Documentation and orders reviewed and attested to.   Inda Coke, PA-C

## 2017-10-01 NOTE — Patient Instructions (Signed)
It was great to see you. Go to your daughter, [name] house, and she will meet you there to go to your next appointment.

## 2017-10-05 ENCOUNTER — Ambulatory Visit (INDEPENDENT_AMBULATORY_CARE_PROVIDER_SITE_OTHER): Payer: Medicare Other

## 2017-10-05 ENCOUNTER — Encounter: Payer: Self-pay | Admitting: Family Medicine

## 2017-10-05 ENCOUNTER — Ambulatory Visit: Payer: Medicare Other | Admitting: Family Medicine

## 2017-10-05 VITALS — BP 136/70 | Ht 72.0 in | Wt 199.0 lb

## 2017-10-05 VITALS — BP 136/70 | HR 54 | Temp 97.8°F | Ht 72.0 in | Wt 199.4 lb

## 2017-10-05 DIAGNOSIS — M5441 Lumbago with sciatica, right side: Secondary | ICD-10-CM

## 2017-10-05 DIAGNOSIS — Z0000 Encounter for general adult medical examination without abnormal findings: Secondary | ICD-10-CM

## 2017-10-05 MED ORDER — PREDNISONE 20 MG PO TABS: ORAL_TABLET | ORAL | 0 refills | Status: DC

## 2017-10-05 NOTE — Progress Notes (Signed)
Subjective:   [name] is a 84 y.o. male who presents for Medicare Annual/Subsequent preventive examination.  [name] in today to see Dr. Yong Channel after a fall in the kitchen. Hx of stroke with right side weakness. Walks with a cane. Has expressive type aphasia but appears to understand language and communication and responds appropriately.  When asked about the fall, demonstrates "twisting" in his chair, as if he spun around and lost his balance.  Eats well and fixes his own food.  Exercise, gets up, cleans the home, does the laundry, waters the plants and does mind games throughout the day. Likes jigsaw puzzles, answers his email, plays computer games.   Mood is pleasant, satisfied, denies depression  Flu vaccine not here at present Educated regarding the shingrix  Cardiac Risk Factors include: advanced age (>56men, >22 women);dyslipidemia;family history of premature cardiovascular disease;hypertension     Objective:    Vitals: BP 136/70   Ht 6' (1.829 m)   Wt 199 lb (90.3 kg)   BMI 26.99 kg/m   Body mass index is 26.99 kg/m.  Advanced Directives 10/05/2017 07/08/2011  Does Patient Have a Medical Advance Directive? Yes Patient has advance directive, copy in chart  Type of Advance Directive - Healthcare Power of Attorney  Pre-existing out of facility DNR order (yellow form or pink MOST form) - No    Tobacco Social History   Tobacco Use  Smoking Status Former Smoker  Smokeless Tobacco Never Used  Tobacco Comment   x 84 yo per the dtr     Counseling given: Yes Comment: x 84 yo per the dtr   Clinical Intake:   Past Medical History:  Diagnosis Date  . Actinic keratosis   . BPH with urinary obstruction   . Bradycardia   . CAD (coronary artery disease)    CABG x5  . Cerebrovascular accident (stroke) ([place])    With right hemiparesis and persistent expressive aphasia  . GERD (gastroesophageal reflux disease)   . History of hydronephrosis   . Hx of  hydronephrosis 07/08/2011   right   . Hyperlipidemia   . Hypertension   . Iron deficiency anemia due to chronic blood loss 03/26/2007   Now resolved    . Iron deficiency anemia secondary to blood loss (chronic)   . Persistent atrial fibrillation ([place])   . Pulmonary hypertension ([place])    by echo 2013  . Rosacea   . Situational depression    Severe  . Tricuspid regurgitation    Past Surgical History:  Procedure Laterality Date  . ABDOMINAL SURGERY    . BACK SURGERY    . BAND HEMORRHOIDECTOMY    . CHOLECYSTECTOMY    . CORONARY ARTERY BYPASS GRAFT     x 5  . dental implants    . HERNIA REPAIR    . KIDNEY SURGERY     stenosis with stent  . TONSILLECTOMY     Family History  Problem Relation Age of Onset  . Heart disease Father   . Heart attack Unknown        fhx  . Coronary artery disease Unknown        fhx   Social History   Socioeconomic History  . Marital status: Married    Spouse name: Not on file  . Number of children: Not on file  . Years of education: Not on file  . Highest education level: Not on file  Occupational History  . Not on file  Social Needs  .  Financial resource strain: Not on file  . Food insecurity:    Worry: Not on file    Inability: Not on file  . Transportation needs:    Medical: Not on file    Non-medical: Not on file  Tobacco Use  . Smoking status: Former Research scientist (life sciences)  . Smokeless tobacco: Never Used  . Tobacco comment: x 84 yo per the dtr  Substance and Sexual Activity  . Alcohol use: No  . Drug use: No  . Sexual activity: Not Currently  Lifestyle  . Physical activity:    Days per week: Not on file    Minutes per session: Not on file  . Stress: Not on file  Relationships  . Social connections:    Talks on phone: Not on file    Gets together: Not on file    Attends religious service: Not on file    Active member of club or organization: Not on file    Attends meetings of clubs or organizations: Not on file    Relationship status:  Not on file  Other Topics Concern  . Not on file  Social History Narrative   Lives in [place] with spouse. LIves at a house at a retirement home. 3 children. 6 grandkids. 5 greatgrandkids.       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 amLODipine (NORVASC) 2.5 MG tablet TAKE 1 TABLET(2.5 MG) BY MOUTH DAILY  . ELIQUIS 5 MG TABS tablet TAKE 1 TABLET(5 MG) BY MOUTH TWICE DAILY  . escitalopram (LEXAPRO) 20 MG tablet TAKE 1 TABLET(20 MG) BY MOUTH DAILY  . Lansoprazole (PREVACID PO) Take 15 mg by mouth daily.  . memantine (NAMENDA) 10 MG tablet TAKE 1 TABLET(10 MG) BY MOUTH DAILY  . Multiple Vitamin (MULTIVITAMIN) tablet Take 1 tablet by mouth daily.    . predniSONE (DELTASONE) 20 MG tablet Take 1 tablet by mouth daily for 7 days  . rosuvastatin (CRESTOR) 20 MG tablet TAKE 1 TABLET(20 MG) BY MOUTH DAILY  . tamsulosin (FLOMAX) 0.4 MG CAPS capsule TAKE 1 CAPSULE(0.4 MG) BY MOUTH AT BEDTIME  . triamterene-hydrochlorothiazide (MAXZIDE-25) 37.5-25 MG tablet TAKE 1 TABLET BY MOUTH EVERY MORNING   No facility-administered encounter medications on file as of 10/05/2017.     Activities of Daily Living In your present state of health, do you have any difficulty performing the following activities: 10/05/2017  Hearing? N  Vision? N  Difficulty concentrating or making decisions? (No Data)  Comment cannot evalaute but understands conversation; can respond   Walking or climbing stairs? Y  Dressing or bathing? N  Doing errands, shopping? N  Comment limited driving   Preparing Food and eating ? N  Using the Toilet? Y  In the past six months, have you accidently leaked urine? (No Data)  Comment had attends on   Do you have problems with loss of bowel control? N  Managing your Medications? N  Managing your Finances? N  Housekeeping or managing your Housekeeping? N  Some recent  data might be hidden    Patient Care Team: [name], MD as PCP - General (Family Medicine)   Assessment:   This is a routine wellness examination for [name].  Exercise Activities and Dietary recommendations Current Exercise Habits: Home exercise routine, Type of exercise: walking, Intensity: Mild  Goals    . Patient Stated  To maintain his health       Fall Risk Fall Risk  10/05/2017 06/01/2016 11/22/2015 07/21/2014 10/07/2012  Falls in the past year? Yes No No No No  Number falls in past yr: 1 - - - -  Risk for fall due to : Impaired balance/gait - - - -  Follow up Education provided - - - -  Comment one fall in the kitchen  - - - -    Depression Screen PHQ 2/9 Scores 10/05/2017 05/21/2017 06/01/2016 11/22/2015  PHQ - 2 Score 0 0 0 0  PHQ- 9 Score - 1 - -    Cognitive Function Appears intact with expressive aphasia. He keeps up with his own meds and is fairly independent at home. Likes to play mind games. Puzzles etc. Worked as Software engineer of a division of [employer]         Immunization History  Administered Date(s) Administered  . Influenza Split 01/09/2011  . Influenza Whole 02/28/2003, 12/18/2007, 11/24/2009  . Influenza, High Dose Seasonal PF 11/22/2015  . Influenza-Unspecified 11/27/2016  . Pneumococcal Conjugate-13 07/21/2014  . Pneumococcal Polysaccharide-23 02/28/2004  . Td 02/27/2005    Qualifies for Shingles Vaccine?   Educated regarding the shingrix  Screening Tests Health Maintenance  Topic Date Due  . INFLUENZA VACCINE  09/27/2017  . TETANUS/TDAP  10/02/2018 (Originally 02/28/2015)  . PNA vac Low Risk Adult  Completed       Plan:      PCP Notes  Lives in apt at [facility] with wife    Health Maintenance Flu vaccine not available Discussed shingrix    Abnormal Screens  none  Referrals  none  Patient concerns; Here for back ache from recent fall in [place], which apparently he demonstrated he was spinning around and lost his  balance. Was able to get up. Is wearing a help line now.  Still driving, dtr stated this may be the last year but he disagreed.   Mood seems pleasant, content  Nurse Concerns; In area where couple can get help as needed. They have refused add'l help at this time. Adjusting to disability, enjoys eating out but likes to spend time on his computer and doing jigsaw puzzles.  Up at hs to the bathroom but walks with care with a cane.    Next PCP apt Today       I have personally reviewed and noted the following in the patient's chart:   . Medical and social history . Use of alcohol, tobacco or illicit drugs  . Current medications and supplements . Functional ability and status . Nutritional status . Physical activity . Advanced directives . List of other physicians . Hospitalizations, surgeries, and ER visits in previous 12 months . Vitals . Screenings to include cognitive, depression, and falls . Referrals and appointments  In addition, I have reviewed and discussed with patient certain preventive protocols, quality metrics, and best practice recommendations. A written personalized care plan for preventive services as well as general preventive health recommendations were provided to patient.     Wynetta Fines, RN  10/05/2017

## 2017-10-05 NOTE — Progress Notes (Signed)
Subjective:  [name] is a 84 y.o. year old very pleasant male patient who presents for/with See problem oriented charting ROS- level V caveat applies  Due to garbled speech after CVA. We attempted to obtain history from his hand motions as well as daughters assistance who knows him well  Past Medical History-  Patient Active Problem List   Diagnosis Date Noted  . Memory loss 07/21/2014    Priority: High  . Chronic atrial fibrillation ([place]) 07/08/2011    Priority: High  . Hemiparesis affecting right side as late effect of cerebrovascular accident ([place]) 03/26/2007    Priority: High  . CAD (coronary artery disease) 08/23/2006    Priority: High  . Venous insufficiency 10/02/2016    Priority: Medium  . Right hydrocele 04/03/2016    Priority: Medium  . Thrombocytopenia ([place]) 11/22/2015    Priority: Medium  . CKD (chronic kidney disease), stage III ([place]) 11/22/2015    Priority: Medium  . Bradycardia 07/09/2011    Priority: Medium  . Major depression in remission ([place]) 07/26/2007    Priority: Medium  . 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    Priority: Medium  . Hyperlipidemia 08/23/2006    Priority: Medium  . Essential hypertension 08/23/2006    Priority: Medium  . Actinic keratosis 07/30/2009    Priority: Low  . GERD 08/23/2006    Priority: Low  . Left wrist pain 11/01/2016    Medications- reviewed and updated Current Outpatient Medications  Medication Sig Dispense Refill  . amLODipine (NORVASC) 2.5 MG tablet TAKE 1 TABLET(2.5 MG) BY MOUTH DAILY 90 tablet 3  . ELIQUIS 5 MG TABS tablet TAKE 1 TABLET(5 MG) BY MOUTH TWICE DAILY 180 tablet 1  . escitalopram (LEXAPRO) 20 MG tablet TAKE 1 TABLET(20 MG) BY MOUTH DAILY 90 tablet 1  . Lansoprazole (PREVACID PO) Take 15 mg by mouth daily.    . memantine (NAMENDA) 10 MG tablet TAKE 1 TABLET(10 MG) BY MOUTH DAILY 90 tablet 1  . Multiple Vitamin (MULTIVITAMIN) tablet Take 1 tablet by mouth daily.      .  rosuvastatin (CRESTOR) 20 MG tablet TAKE 1 TABLET(20 MG) BY MOUTH DAILY 90 tablet 3  . tamsulosin (FLOMAX) 0.4 MG CAPS capsule TAKE 1 CAPSULE(0.4 MG) BY MOUTH AT BEDTIME 90 capsule 5  . triamterene-hydrochlorothiazide (MAXZIDE-25) 37.5-25 MG tablet TAKE 1 TABLET BY MOUTH EVERY MORNING 90 tablet 1  . predniSONE (DELTASONE) 20 MG tablet Take 1 tablet by mouth daily for 7 days 7 tablet 0   No current facility-administered medications for this visit.     Objective: BP 136/70   Pulse (!) 54   Temp 97.8 F (36.6 C) (Oral)   Ht 6' (1.829 m)   Wt 199 lb 6.4 oz (90.4 kg)   SpO2 97%   BMI 27.04 kg/m  Gen: NAD, resting comfortably CV: RRR no murmurs rubs or gallops Lungs: CTAB no crackles, wheeze, rhonchi Abdomen: soft/nontender/nondistended/normal bowel sounds.  Ext: no edema Skin: warm, dry MSK: Back brace in place. No pain with palpation along the back. Difficult to participate in exam as far as instructions for position change.   Assessment/Plan:  Acute right-sided low back pain with right-sided sciatica S:  fall aout 3 weeks ago. Right low back pain since that time. Patient now describes radiation into right leg by pointing over and over with hand over hip and down to ankle. No pain when on advil 3x a day but has pain without this.  A/P: difficult history taking  here and exam. Seems like potential sciatiica with right low back pain. Patient without midline pain. No clear incontinence, leg weakness, saddle anesthesia. Will trial prednisone for 7 days to see if it helps. If not, after 2 more weeks could consider x-ray if not improving (would be 5 weeks out and if lack of improvement and with poor ability to obtain history would be reasonable)    Future Appointments  Date Time Provider [place]  11/21/2017 11:00 AM [name], MD [facility]    Meds ordered this encounter  Medications  . predniSONE (DELTASONE) 20 MG tablet    Sig: Take 1 tablet by mouth daily for 7  days    Dispense:  7 tablet    Refill:  0    Return precautions advised.  [name], MD

## 2017-10-05 NOTE — Progress Notes (Signed)
I have reviewed and agree with note, evaluation, plan.  See my separate note from today  Stephen Hunter, MD  

## 2017-10-05 NOTE — Patient Instructions (Addendum)
Health Maintenance Due  Topic Date Due  . INFLUENZA VACCINE -Schedule for Fall 09/27/2017   Lets try prednisone for 7 days. See me back for new or worsening symptoms or if you are not any better in 2 weeks. See [place] back immediately for leg weakness, new incontinence of stool or urine, numbness or tingling in groin

## 2017-10-05 NOTE — Patient Instructions (Addendum)
Mr. Frerking , Thank you for taking time to come for your Medicare Wellness Visit. I appreciate your ongoing commitment to your health goals. Please review the following plan we discussed and let me know if I can assist you in the future.   Shingrix is a vaccine for the prevention of Shingles in Adults 50 and older.  If you are on Medicare, the shingrix is covered under your Part D plan, so you will take both of the vaccines in the series at your pharmacy. Please check with your benefits regarding applicable copays or out of pocket expenses.  The Shingrix is given in 2 vaccines approx 8 weeks apart. You must receive the 2nd dose prior to 6 months from receipt of the first. Please have the pharmacist print out you Immunization  dates for our office records     These are the goals we discussed: Goals    . Patient Stated     To maintain his health       This is a list of the screening recommended for you and due dates:  Health Maintenance  Topic Date Due  . Flu Shot  09/27/2017  . Tetanus Vaccine  10/02/2018*  . Pneumonia vaccines  Completed  *Topic was postponed. The date shown is not the original due date.      Fall Prevention in the Home Falls can cause injuries. They can happen to people of all ages. There are many things you can do to make your home safe and to help prevent falls. What can I do on the outside of my home?  Regularly fix the edges of walkways and driveways and fix any cracks.  Remove anything that might make you trip as you walk through a door, such as a raised step or threshold.  Trim any bushes or trees on the path to your home.  Use bright outdoor lighting.  Clear any walking paths of anything that might make someone trip, such as rocks or tools.  Regularly check to see if handrails are loose or broken. Make sure that both sides of any steps have handrails.  Any raised decks and porches should have guardrails on the edges.  Have any leaves, snow, or ice  cleared regularly.  Use sand or salt on walking paths during winter.  Clean up any spills in your garage right away. This includes oil or grease spills. What can I do in the bathroom?  Use night lights.  Install grab bars by the toilet and in the tub and shower. Do not use towel bars as grab bars.  Use non-skid mats or decals in the tub or shower.  If you need to sit down in the shower, use a plastic, non-slip stool.  Keep the floor dry. Clean up any water that spills on the floor as soon as it happens.  Remove soap buildup in the tub or shower regularly.  Attach bath mats securely with double-sided non-slip rug tape.  Do not have throw rugs and other things on the floor that can make you trip. What can I do in the bedroom?  Use night lights.  Make sure that you have a light by your bed that is easy to reach.  Do not use any sheets or blankets that are too big for your bed. They should not hang down onto the floor.  Have a firm chair that has side arms. You can use this for support while you get dressed.  Do not have throw rugs and  other things on the floor that can make you trip. What can I do in the kitchen?  Clean up any spills right away.  Avoid walking on wet floors.  Keep items that you use a lot in easy-to-reach places.  If you need to reach something above you, use a strong step stool that has a grab bar.  Keep electrical cords out of the way.  Do not use floor polish or wax that makes floors slippery. If you must use wax, use non-skid floor wax.  Do not have throw rugs and other things on the floor that can make you trip. What can I do with my stairs?  Do not leave any items on the stairs.  Make sure that there are handrails on both sides of the stairs and use them. Fix handrails that are broken or loose. Make sure that handrails are as long as the stairways.  Check any carpeting to make sure that it is firmly attached to the stairs. Fix any carpet that  is loose or worn.  Avoid having throw rugs at the top or bottom of the stairs. If you do have throw rugs, attach them to the floor with carpet tape.  Make sure that you have a light switch at the top of the stairs and the bottom of the stairs. If you do not have them, ask someone to add them for you. What else can I do to help prevent falls?  Wear shoes that: ? Do not have high heels. ? Have rubber bottoms. ? Are comfortable and fit you well. ? Are closed at the toe. Do not wear sandals.  If you use a stepladder: ? Make sure that it is fully opened. Do not climb a closed stepladder. ? Make sure that both sides of the stepladder are locked into place. ? Ask someone to hold it for you, if possible.  Clearly mark and make sure that you can see: ? Any grab bars or handrails. ? First and last steps. ? Where the edge of each step is.  Use tools that help you move around (mobility aids) if they are needed. These include: ? Canes. ? Walkers. ? Scooters. ? Crutches.  Turn on the lights when you go into a dark area. Replace any light bulbs as soon as they burn out.  Set up your furniture so you have a clear path. Avoid moving your furniture around.  If any of your floors are uneven, fix them.  If there are any pets around you, be aware of where they are.  Review your medicines with your doctor. Some medicines can make you feel dizzy. This can increase your chance of falling. Ask your doctor what other things that you can do to help prevent falls. This information is not intended to replace advice given to you by your health care provider. Make sure you discuss any questions you have with your health care provider. Document Released: 12/10/2008 Document Revised: 07/22/2015 Document Reviewed: 03/20/2014 Elsevier Interactive Patient Education  2018 [place] Maintenance, Male A healthy lifestyle and preventive care is important for your health and wellness. Ask your  health care provider about what schedule of regular examinations is right for you. What should I know about weight and diet? Eat a Healthy Diet  Eat plenty of vegetables, fruits, whole grains, low-fat dairy products, and lean protein.  Do not eat a lot of foods high in solid fats, added sugars, or salt.  Maintain a Healthy Weight  Regular exercise can help you achieve or maintain a healthy weight. You should:  Do at least 150 minutes of exercise each week. The exercise should increase your heart rate and make you sweat (moderate-intensity exercise).  Do strength-training exercises at least twice a week.  Watch Your Levels of Cholesterol and Blood Lipids  Have your blood tested for lipids and cholesterol every 5 years starting at 82 years of age. If you are at high risk for heart disease, you should start having your blood tested when you are 82 years old. You may need to have your cholesterol levels checked more often if: ? Your lipid or cholesterol levels are high. ? You are older than 82 years of age. ? You are at high risk for heart disease.  What should I know about cancer screening? Many types of cancers can be detected early and may often be prevented. Lung Cancer  You should be screened every year for lung cancer if: ? You are a current smoker who has smoked for at least 30 years. ? You are a former smoker who has quit within the past 15 years.  Talk to your health care provider about your screening options, when you should start screening, and how often you should be screened.  Colorectal Cancer  Routine colorectal cancer screening usually begins at 82 years of age and should be repeated every 5-10 years until you are 82 years old. You may need to be screened more often if early forms of precancerous polyps or small growths are found. Your health care provider may recommend screening at an earlier age if you have risk factors for colon cancer.  Your health care provider may  recommend using home test kits to check for hidden blood in the stool.  A small camera at the end of a tube can be used to examine your colon (sigmoidoscopy or colonoscopy). This checks for the earliest forms of colorectal cancer.  Prostate and Testicular Cancer  Depending on your age and overall health, your health care provider may do certain tests to screen for prostate and testicular cancer.  Talk to your health care provider about any symptoms or concerns you have about testicular or prostate cancer.  Skin Cancer  Check your skin from head to toe regularly.  Tell your health care provider about any new moles or changes in moles, especially if: ? There is a change in a mole's size, shape, or color. ? You have a mole that is larger than a pencil eraser.  Always use sunscreen. Apply sunscreen liberally and repeat throughout the day.  Protect yourself by wearing long sleeves, pants, a wide-brimmed hat, and sunglasses when outside.  What should I know about heart disease, diabetes, and high blood pressure?  If you are 49-53 years of age, have your blood pressure checked every 3-5 years. If you are 63 years of age or older, have your blood pressure checked every year. You should have your blood pressure measured twice-once when you are at a hospital or clinic, and once when you are not at a hospital or clinic. Record the average of the two measurements. To check your blood pressure when you are not at a hospital or clinic, you can use: ? An automated blood pressure machine at a pharmacy. ? A home blood pressure monitor.  Talk to your health care provider about your target blood pressure.  If you are between 84-45 years old, ask your health care provider if you should take aspirin to  prevent heart disease.  Have regular diabetes screenings by checking your fasting blood sugar level. ? If you are at a normal weight and have a low risk for diabetes, have this test once every three years  after the age of 55. ? If you are overweight and have a high risk for diabetes, consider being tested at a younger age or more often.  A one-time screening for abdominal aortic aneurysm (AAA) by ultrasound is recommended for men aged 41-75 years who are current or former smokers. What should I know about preventing infection? Hepatitis B If you have a higher risk for hepatitis B, you should be screened for this virus. Talk with your health care provider to find out if you are at risk for hepatitis B infection. Hepatitis C Blood testing is recommended for:  Everyone born from [birth year] through 1963.  Anyone with known risk factors for hepatitis C.  Sexually Transmitted Diseases (STDs)  You should be screened each year for STDs including gonorrhea and chlamydia if: ? You are sexually active and are younger than 82 years of age. ? You are older than 82 years of age and your health care provider tells you that you are at risk for this type of infection. ? Your sexual activity has changed since you were last screened and you are at an increased risk for chlamydia or gonorrhea. Ask your health care provider if you are at risk.  Talk with your health care provider about whether you are at high risk of being infected with HIV. Your health care provider may recommend a prescription medicine to help prevent HIV infection.  What else can I do?  Schedule regular health, dental, and eye exams.  Stay current with your vaccines (immunizations).  Do not use any tobacco products, such as cigarettes, chewing tobacco, and e-cigarettes. If you need help quitting, ask your health care provider.  Limit alcohol intake to no more than 2 drinks per day. One drink equals 12 ounces of beer, 5 ounces of wine, or 1 ounces of hard liquor.  Do not use street drugs.  Do not share needles.  Ask your health care provider for help if you need support or information about quitting drugs.  Tell your health care  provider if you often feel depressed.  Tell your health care provider if you have ever been abused or do not feel safe at home. This information is not intended to replace advice given to you by your health care provider. Make sure you discuss any questions you have with your health care provider. Document Released: 08/12/2007 Document Revised: 10/13/2015 Document Reviewed: 11/17/2014 Elsevier Interactive Patient Education  [employer].

## 2017-11-02 ENCOUNTER — Other Ambulatory Visit: Payer: Self-pay | Admitting: Family Medicine

## 2017-11-05 ENCOUNTER — Other Ambulatory Visit: Payer: Self-pay | Admitting: Family Medicine

## 2017-11-21 ENCOUNTER — Ambulatory Visit: Payer: Medicare Other | Admitting: Family Medicine

## 2017-11-23 ENCOUNTER — Other Ambulatory Visit: Payer: Self-pay | Admitting: Family Medicine

## 2017-12-22 ENCOUNTER — Other Ambulatory Visit: Payer: Self-pay | Admitting: Family Medicine

## 2017-12-25 ENCOUNTER — Encounter (HOSPITAL_COMMUNITY): Payer: Self-pay | Admitting: Nurse Practitioner

## 2017-12-25 ENCOUNTER — Ambulatory Visit (HOSPITAL_COMMUNITY)
Admission: RE | Admit: 2017-12-25 | Discharge: 2017-12-25 | Disposition: A | Discharge status: Home / Self Care | Payer: Medicare Other | Source: Ambulatory Visit | Attending: Nurse Practitioner | Admitting: Nurse Practitioner

## 2017-12-25 VITALS — BP 162/80 | HR 70 | Ht 72.0 in | Wt 198.4 lb

## 2017-12-25 DIAGNOSIS — Z9049 Acquired absence of other specified parts of digestive tract: Secondary | ICD-10-CM | POA: Diagnosis not present

## 2017-12-25 DIAGNOSIS — Z951 Presence of aortocoronary bypass graft: Secondary | ICD-10-CM | POA: Diagnosis not present

## 2017-12-25 DIAGNOSIS — K219 Gastro-esophageal reflux disease without esophagitis: Secondary | ICD-10-CM | POA: Diagnosis not present

## 2017-12-25 DIAGNOSIS — Z87891 Personal history of nicotine dependence: Secondary | ICD-10-CM | POA: Diagnosis not present

## 2017-12-25 DIAGNOSIS — I2510 Atherosclerotic heart disease of native coronary artery without angina pectoris: Secondary | ICD-10-CM | POA: Diagnosis not present

## 2017-12-25 DIAGNOSIS — Z950 Presence of cardiac pacemaker: Secondary | ICD-10-CM | POA: Insufficient documentation

## 2017-12-25 DIAGNOSIS — I69320 Aphasia following cerebral infarction: Secondary | ICD-10-CM | POA: Diagnosis not present

## 2017-12-25 DIAGNOSIS — I4811 Longstanding persistent atrial fibrillation: Secondary | ICD-10-CM | POA: Diagnosis not present

## 2017-12-25 DIAGNOSIS — I4510 Unspecified right bundle-branch block: Secondary | ICD-10-CM | POA: Insufficient documentation

## 2017-12-25 DIAGNOSIS — Z882 Allergy status to sulfonamides status: Secondary | ICD-10-CM | POA: Diagnosis not present

## 2017-12-25 DIAGNOSIS — Z7901 Long term (current) use of anticoagulants: Secondary | ICD-10-CM | POA: Insufficient documentation

## 2017-12-25 DIAGNOSIS — Z8249 Family history of ischemic heart disease and other diseases of the circulatory system: Secondary | ICD-10-CM | POA: Diagnosis not present

## 2017-12-25 DIAGNOSIS — E785 Hyperlipidemia, unspecified: Secondary | ICD-10-CM | POA: Diagnosis not present

## 2017-12-25 DIAGNOSIS — I10 Essential (primary) hypertension: Secondary | ICD-10-CM | POA: Diagnosis not present

## 2017-12-25 LAB — CBC
HCT: 39 % (ref 39.0–52.0)
Hemoglobin: 12.7 g/dL — ABNORMAL LOW (ref 13.0–17.0)
MCH: 31.2 pg (ref 26.0–34.0)
MCHC: 32.6 g/dL (ref 30.0–36.0)
MCV: 95.8 fL (ref 80.0–100.0)
Platelets: 157 10*3/uL (ref 150–400)
RBC: 4.07 MIL/uL — ABNORMAL LOW (ref 4.22–5.81)
RDW: 13.4 % (ref 11.5–15.5)
WBC: 5.6 10*3/uL (ref 4.0–10.5)
nRBC: 0 % (ref 0.0–0.2)

## 2017-12-25 LAB — BASIC METABOLIC PANEL
Anion gap: 8 (ref 5–15)
BUN: 30 mg/dL — AB (ref 8–23)
CALCIUM: 9.2 mg/dL (ref 8.9–10.3)
CO2: 28 mmol/L (ref 22–32)
Chloride: 102 mmol/L (ref 98–111)
Creatinine, Ser: 1.39 mg/dL — ABNORMAL HIGH (ref 0.61–1.24)
GFR calc Af Amer: 51 mL/min — ABNORMAL LOW (ref >60)
GFR, EST NON AFRICAN AMERICAN: 44 mL/min — AB (ref >60)
GLUCOSE: 105 mg/dL — AB (ref 70–99)
Potassium: 4.4 mmol/L (ref 3.5–5.1)
Sodium: 138 mmol/L (ref 135–145)

## 2017-12-25 NOTE — Progress Notes (Signed)
[name], male   DOB: 15-Apr-1928, 84 y.o.   MRN: [id number]       Primary Care Physician: [name], MD Primary Electrophysiologist: Dr. Rachelle Hora is a 84 y.o. male with a h/o long standing persistent atrial fibrillation who presents for f/u in the [facility]. The patient is aphasic due to previous stroke and is here with huis step son. He is off chronotropic drugs due to bradycardia in the past but EKG shows afib with v rates in the 70's today. Taking eliquis without bleeding issues with a chadsvasc of at least 6.His last bmet in August showed a creatinine of 1.76, prior to that less than 1.5.  Will repeat today to see if eliquis dose needs to be reduced to 2.5 mg bid. He had 3 falls over the last year, all in  the home, was not witnessed by his wife, and pt was able to get up himself. Pt indicates it was more a stumble, not presyncope that caused falls. No obvious  Injuries from falls.. He does walk with a cane.   Today, he denies symptoms of palpitations, chest pain, shortness of breath, orthopnea, PND,  dizziness, presyncope, syncope, snoring, daytime somnolence, bleeding, or neurologic sequela..The patient is tolerating medications without difficulties and is otherwise without complaint today.    Past Medical History:  Diagnosis Date  . Actinic keratosis   . BPH with urinary obstruction   . Bradycardia   . CAD (coronary artery disease)    CABG x5  . Cerebrovascular accident (stroke) ([place])    With right hemiparesis and persistent expressive aphasia  . GERD (gastroesophageal reflux disease)   . History of hydronephrosis   . Hx of hydronephrosis 07/08/2011   right   . Hyperlipidemia   . Hypertension   . Iron deficiency anemia due to chronic blood loss 03/26/2007   Now resolved    . Iron deficiency anemia secondary to blood loss (chronic)   . Persistent atrial fibrillation   . Pulmonary hypertension ([place])    by  echo 2013  . Rosacea   . Situational depression    Severe  . Tricuspid regurgitation    Past Surgical History:  Procedure Laterality Date  . ABDOMINAL SURGERY    . BACK SURGERY    . BAND HEMORRHOIDECTOMY    . CHOLECYSTECTOMY    . CORONARY ARTERY BYPASS GRAFT     x 5  . dental implants    . HERNIA REPAIR    . KIDNEY SURGERY     stenosis with stent  . TONSILLECTOMY        Allergies  Allergen Reactions  . Ciprofloxacin Hcl     unknown  . Sulfamethoxazole-Trimethoprim     unknown    Social History   Socioeconomic History  . Marital status: Married    Spouse name: Not on file  . Number of children: Not on file  . Years of education: Not on file  . Highest education level: Not on file  Occupational History  . Not on file  Social Needs  . Financial resource strain: Not on file  . Food insecurity:    Worry: Not on file    Inability: Not on file  . Transportation needs:    Medical: Not on file    Non-medical: Not on file  Tobacco Use  . Smoking status: Former Research scientist (life sciences)  . Smokeless tobacco: Never Used  . Tobacco comment: x 84 yo  per the dtr  Substance and Sexual Activity  . Alcohol use: No  . Drug use: No  . Sexual activity: Not Currently  Lifestyle  . Physical activity:    Days per week: Not on file    Minutes per session: Not on file  . Stress: Not on file  Relationships  . Social connections:    Talks on phone: Not on file    Gets together: Not on file    Attends religious service: Not on file    Active member of club or organization: Not on file    Attends meetings of clubs or organizations: Not on file    Relationship status: Not on file  . Intimate partner violence:    Fear of current or ex partner: Not on file    Emotionally abused: Not on file    Physically abused: Not on file    Forced sexual activity: Not on file  Other Topics Concern  . Not on file  Social History Narrative   Lives in [place] with spouse. LIves at a house at a retirement  home. 3 children. 6 grandkids. 5 greatgrandkids.       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 Heart disease Father   . Heart attack Unknown        fhx  . Coronary artery disease Unknown        fhx   The patient does not have a history of early familial atrial fibrillation or other arrhythmias.  ROS- All systems are reviewed and negative except as per the HPI above.  Physical Exam: GEN- The patient is well appearing, alert and oriented x 3 today, aphasic   Head- normocephalic, atraumatic Eyes-  Sclera clear, conjunctiva pink Ears- hearing intact Oropharynx- clear Neck- supple, no JVP Lymph- no cervical lymphadenopathy Lungs- Clear to ausculation bilaterally, normal work of breathing Heart- Irregular rate and rhythm, no murmurs, rubs or gallops, PMI not laterally displaced GI- soft, NT, ND, + BS Extremities- no clubbing, cyanosis, or trace edema on rt, 1t on left (prior cabg) MS- no significant deformity or atrophy Skin- no rash or lesion Psych- euthymic mood, full affect Neuro- strength and sensation are intact  Ekg today shows afib at 70 bpm, IRBBB Holter monitor 02/2016-Study Highlights   Atrial fibrillation with controlled ventricular rates Average heart rate 58 bpm, max heart rate 98 bpm Nocturnal pauses of up to 3.2 seconds are noted,  No daytime pauses Occasional premature ventricular contractions with rare nonsustained ventricular tachycardia      Assessment and Plan:  1. Atrial fibrillation The patient has asymptomatic longstanding persistent atrial fibrillation with slow ventricular response, althjough v rates in the 70's today Monitor placed in January 2018 did  not show indication for PPM.  He  is  appropriately anticoagulated at this time.    2. Previous CVA with aphasia Continue Eliquis with chadsvasc of at least 6. Repeat bmet/cbc today to see if  still qualifies to continue with Eliquis 5 mg bid Fall precautions discussed  F/u in one year Sooner if needed  Butch Penny C. Lambros Cerro, [facility] [address] [place], [place] [address] [phone]

## 2017-12-26 ENCOUNTER — Other Ambulatory Visit: Payer: Self-pay | Admitting: Family Medicine

## 2018-01-08 ENCOUNTER — Other Ambulatory Visit: Payer: Self-pay | Admitting: Family Medicine

## 2018-01-22 ENCOUNTER — Encounter: Payer: Self-pay | Admitting: Family Medicine

## 2018-01-22 ENCOUNTER — Ambulatory Visit (INDEPENDENT_AMBULATORY_CARE_PROVIDER_SITE_OTHER): Payer: Medicare Other | Admitting: Family Medicine

## 2018-01-22 VITALS — BP 122/70 | HR 48 | Temp 98.1°F | Ht 72.0 in | Wt 199.6 lb

## 2018-01-22 DIAGNOSIS — I10 Essential (primary) hypertension: Secondary | ICD-10-CM | POA: Diagnosis not present

## 2018-01-22 DIAGNOSIS — N401 Enlarged prostate with lower urinary tract symptoms: Secondary | ICD-10-CM

## 2018-01-22 DIAGNOSIS — I872 Venous insufficiency (chronic) (peripheral): Secondary | ICD-10-CM

## 2018-01-22 DIAGNOSIS — D696 Thrombocytopenia, unspecified: Secondary | ICD-10-CM

## 2018-01-22 DIAGNOSIS — N138 Other obstructive and reflux uropathy: Secondary | ICD-10-CM

## 2018-01-22 DIAGNOSIS — E785 Hyperlipidemia, unspecified: Secondary | ICD-10-CM

## 2018-01-22 DIAGNOSIS — Z23 Encounter for immunization: Secondary | ICD-10-CM

## 2018-01-22 MED ORDER — AMLODIPINE BESYLATE 2.5 MG PO TABS: ORAL_TABLET | ORAL | 1 refills | Status: DC

## 2018-01-22 MED ORDER — TAMSULOSIN HCL 0.4 MG PO CAPS: ORAL_CAPSULE | ORAL | 5 refills | Status: DC

## 2018-01-22 MED ORDER — TRIAMTERENE-HCTZ 37.5-25 MG PO TABS: 1.0000 | ORAL_TABLET | Freq: Every morning | ORAL | 1 refills | Status: DC

## 2018-01-22 MED ORDER — TRIAMCINOLONE ACETONIDE 0.1 % EX CREA: 1.0000 "application " | TOPICAL_CREAM | Freq: Two times a day (BID) | CUTANEOUS | 0 refills | Status: DC

## 2018-01-22 MED ORDER — ROSUVASTATIN CALCIUM 20 MG PO TABS: ORAL_TABLET | ORAL | 2 refills | Status: DC

## 2018-01-22 MED ORDER — MEMANTINE HCL 10 MG PO TABS: ORAL_TABLET | ORAL | 1 refills | Status: DC

## 2018-01-22 MED ORDER — CEPHALEXIN 500 MG PO CAPS: 500.0000 mg | ORAL_CAPSULE | Freq: Two times a day (BID) | ORAL | 0 refills | Status: DC

## 2018-01-22 MED ORDER — ESCITALOPRAM OXALATE 20 MG PO TABS: ORAL_TABLET | ORAL | 1 refills | Status: DC

## 2018-01-22 MED ORDER — APIXABAN 5 MG PO TABS: ORAL_TABLET | ORAL | 1 refills | Status: DC

## 2018-01-22 NOTE — Assessment & Plan Note (Signed)
S:  controlled on Crestor 20 mg with last LDL at 66 last year A/P: Stable-refilled medication-consider repeating full lipid panel at follow-up

## 2018-01-22 NOTE — Assessment & Plan Note (Signed)
S: controlled on amlodipine 2.5 mg, triamterene-hydrochlorothiazide 37.5-25 mg BP Readings from Last 3 Encounters:  01/22/18 122/70  12/25/17 (!) 162/80  10/05/17 136/70  A/P: We discussed blood pressure goal of <140/90. Continue current meds: Refilled medications today

## 2018-01-22 NOTE — Progress Notes (Addendum)
Subjective:  [name] is a 84 y.o. year old very pleasant male patient who presents for/with See problem oriented charting ROS-technically level 5 caveat applies due to garbled speech-per family no fever, chills, nausea, vomiting.  No rapidly expanding redness  Past Medical History-  Patient Active Problem List   Diagnosis Date Noted  . Memory loss 07/21/2014    Priority: High  . Chronic atrial fibrillation 07/08/2011    Priority: High  . Hemiparesis affecting right side as late effect of cerebrovascular accident ([place]) 03/26/2007    Priority: High  . CAD (coronary artery disease) 08/23/2006    Priority: High  . Venous insufficiency 10/02/2016    Priority: Medium  . Right hydrocele 04/03/2016    Priority: Medium  . Thrombocytopenia ([place]) 11/22/2015    Priority: Medium  . CKD (chronic kidney disease), stage III ([place]) 11/22/2015    Priority: Medium  . Bradycardia 07/09/2011    Priority: Medium  . Major depression in remission ([place]) 07/26/2007    Priority: Medium  . 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    Priority: Medium  . Hyperlipidemia 08/23/2006    Priority: Medium  . Essential hypertension 08/23/2006    Priority: Medium  . Actinic keratosis 07/30/2009    Priority: Low  . GERD 08/23/2006    Priority: Low  . Left wrist pain 11/01/2016    Medications- reviewed and updated Current Outpatient Medications  Medication Sig Dispense Refill  . amLODipine (NORVASC) 2.5 MG tablet TAKE 1 TABLET(2.5 MG) BY MOUTH DAILY 90 tablet 1  . apixaban (ELIQUIS) 5 MG TABS tablet TAKE 1 TABLET(5 MG) BY MOUTH TWICE DAILY 180 tablet 1  . escitalopram (LEXAPRO) 20 MG tablet TAKE 1 TABLET(20 MG) BY MOUTH DAILY 90 tablet 1  . Lansoprazole (PREVACID PO) Take 15 mg by mouth daily.    . memantine (NAMENDA) 10 MG tablet TAKE 1 TABLET(10 MG) BY MOUTH DAILY 90 tablet 1  . Multiple Vitamin (MULTIVITAMIN) tablet Take 1 tablet by mouth daily.      . rosuvastatin (CRESTOR) 20  MG tablet Take 1 tablet po qd 90 tablet 2  . tamsulosin (FLOMAX) 0.4 MG CAPS capsule TAKE 1 CAPSULE(0.4 MG) BY MOUTH AT BEDTIME 90 capsule 5  . triamterene-hydrochlorothiazide (MAXZIDE-25) 37.5-25 MG tablet Take 1 tablet by mouth every morning. 90 tablet 1  . cephALEXin (KEFLEX) 500 MG capsule Take 1 capsule (500 mg total) by mouth 2 (two) times daily for 7 days. 14 capsule 0  . triamcinolone cream (KENALOG) 0.1 % Apply 1 application topically 2 (two) times daily. For 7-10 days maximum 80 g 0   No current facility-administered medications for this visit.     Objective: BP 122/70 (BP Location: Left Arm, Patient Position: Sitting, Cuff Size: Normal)   Pulse (!) 48   Temp 98.1 F (36.7 C) (Oral)   Ht 6' (1.829 m)   Wt 199 lb 9.6 oz (90.5 kg)   SpO2 98%   BMI 27.07 kg/m  Gen: NAD, resting comfortably CV: Slightly bradycardic with heart rate in the 50s-no murmurs rubs or gallops Lungs: CTAB no crackles, wheeze, rhonchi Ext/skin: 1+ edema bilaterally- erythema bilateral lower legs with some crusting/scaling/venous stasis changes-on left leg this is for more extensive and multiple areas of excoriation- patient is tender in these areas and area is more red and warm than the other leg.  Neuro: moves all extremities, garbled speech-stable with baseline  Assessment/Plan:   Rash S: Rash on left lower leg for at least 10 days.  Family has noted redness, warmth on left lower leg.  Swelling has not significantly worsened.  Patient is tender to touch-per his facial expressions this seems to be at least a moderate level pain with palpation.  No treatments have been tried.  They think it is pruritic based on the fact that there are scratch marks in it.  He always has some redness in his bilateral lower extremities as well as swelling- but seems to be more pronounced on the left-also seems to have more scaling/irritation on left lower leg.   ROS-not ill appearing, no fever/chills. No new medications. Not  immunocompromised. No mucus membrane involvement.  A/P: I suspect this is primarily irritated venous stasis skin changes.  Going in the holiday weekend-I am worried about possibility of early skin infection that would not be treated if we did not use antibiotics.  Patient's son-in-law also with some anxiety about going into the weekend without coverage. From AVS:  " We are going to cover for possible cellulitis/skin infection on left lower extremity given we are going into the holiday weekend-take antibiotic twice a day for 7 days.  Also gave anti-itch cream for him to use called triamcinolone  Please schedule follow-up next week- for front office-May use same-day appointment slot for this.  If not improving we may have to order home health for [facility].  Would be great if he could wear some compression stockings on the lower legs to help push some of the fluid out-but I know this is a difficult set up at home for them "  Essential hypertension S: controlled on amlodipine 2.5 mg, triamterene-hydrochlorothiazide 37.5-25 mg BP Readings from Last 3 Encounters:  01/22/18 122/70  12/25/17 (!) 162/80  10/05/17 136/70  A/P: We discussed blood pressure goal of <140/90. Continue current meds: Refilled medications today  Hyperlipidemia S:  controlled on Crestor 20 mg with last LDL at 66 last year A/P: Stable-refilled medication-consider repeating full lipid panel at follow-up  BPH with obstruction/lower urinary tract symptoms S:  BPH on flomax is reasonably controlled  A/P: Stable-refilled medication  Thrombocytopenia ([place]) Recent blood work with cardiology without thrombocytopenia- we will continue to monitor-if persists in normal range for 6 to 12 months will resolve the issue.  Future Appointments  Date Time Provider [place]  01/29/2018  1:00 PM [name], MD [facility]   Lab/Order associations: Need for prophylactic vaccination and inoculation against influenza - Plan:  Flu vaccine HIGH DOSE PF (Fluzone High dose)   Meds ordered this encounter  Medications  . amLODipine (NORVASC) 2.5 MG tablet    Sig: TAKE 1 TABLET(2.5 MG) BY MOUTH DAILY    Dispense:  90 tablet    Refill:  1  . apixaban (ELIQUIS) 5 MG TABS tablet    Sig: TAKE 1 TABLET(5 MG) BY MOUTH TWICE DAILY    Dispense:  180 tablet    Refill:  1  . escitalopram (LEXAPRO) 20 MG tablet    Sig: TAKE 1 TABLET(20 MG) BY MOUTH DAILY    Dispense:  90 tablet    Refill:  1  . memantine (NAMENDA) 10 MG tablet    Sig: TAKE 1 TABLET(10 MG) BY MOUTH DAILY    Dispense:  90 tablet    Refill:  1  . rosuvastatin (CRESTOR) 20 MG tablet    Sig: Take 1 tablet po qd    Dispense:  90 tablet    Refill:  2  . tamsulosin (FLOMAX) 0.4 MG CAPS capsule  Sig: TAKE 1 CAPSULE(0.4 MG) BY MOUTH AT BEDTIME    Dispense:  90 capsule    Refill:  5    **Patient requests 90 days supply**  . triamterene-hydrochlorothiazide (MAXZIDE-25) 37.5-25 MG tablet    Sig: Take 1 tablet by mouth every morning.    Dispense:  90 tablet    Refill:  1  . triamcinolone cream (KENALOG) 0.1 %    Sig: Apply 1 application topically 2 (two) times daily. For 7-10 days maximum    Dispense:  80 g    Refill:  0  . cephALEXin (KEFLEX) 500 MG capsule    Sig: Take 1 capsule (500 mg total) by mouth 2 (two) times daily for 7 days.    Dispense:  14 capsule    Refill:  0    Return precautions advised.  [name], MD

## 2018-01-22 NOTE — Patient Instructions (Addendum)
We are going to cover for possible cellulitis/skin infection on left lower extremity given we are going into the holiday weekend-take antibiotic twice a day for 7 days.  Also gave anti-itch cream for him to use called triamcinolone  Please schedule follow-up next week- for front office-May use same-day appointment slot for this.  If not improving we may have to order home health for [facility].  Would be great if he could wear some compression stockings on the lower legs to help push some of the fluid out-but I know this is a difficult set up at home for them

## 2018-01-22 NOTE — Assessment & Plan Note (Signed)
S:  BPH on flomax is reasonably controlled  A/P: Stable-refilled medication

## 2018-01-22 NOTE — Assessment & Plan Note (Signed)
Recent blood work with cardiology without thrombocytopenia- we will continue to monitor-if persists in normal range for 6 to 12 months will resolve the issue.

## 2018-01-29 ENCOUNTER — Ambulatory Visit (INDEPENDENT_AMBULATORY_CARE_PROVIDER_SITE_OTHER): Payer: Medicare Other | Admitting: Family Medicine

## 2018-01-29 ENCOUNTER — Encounter: Payer: Self-pay | Admitting: Family Medicine

## 2018-01-29 VITALS — BP 138/86 | HR 59 | Temp 98.1°F | Ht 72.0 in | Wt 199.0 lb

## 2018-01-29 DIAGNOSIS — I10 Essential (primary) hypertension: Secondary | ICD-10-CM

## 2018-01-29 DIAGNOSIS — I872 Venous insufficiency (chronic) (peripheral): Secondary | ICD-10-CM | POA: Diagnosis not present

## 2018-01-29 NOTE — Patient Instructions (Signed)
Blood pressure looks better on repeat  Leg looks much better- can use anti itch cream maximum 10 days then would use something like eucerin, vaseline, cerave once to twice a day to keep legs moisturized. Also would use compression stockings to try to prevent recurrence of the superficial skin infection.

## 2018-01-29 NOTE — Progress Notes (Signed)
Subjective:  [name] is a 84 y.o. year old very pleasant male patient who presents for/with See problem oriented charting ROS-no fever, chills, nausea, vomiting.  Redness has receded on left leg.  Still some itching.  Past Medical History-  Patient Active Problem List   Diagnosis Date Noted  . Memory loss 07/21/2014    Priority: High  . Chronic atrial fibrillation 07/08/2011    Priority: High  . Hemiparesis affecting right side as late effect of cerebrovascular accident ([place]) 03/26/2007    Priority: High  . CAD (coronary artery disease) 08/23/2006    Priority: High  . Venous insufficiency 10/02/2016    Priority: Medium  . Right hydrocele 04/03/2016    Priority: Medium  . Thrombocytopenia ([place]) 11/22/2015    Priority: Medium  . CKD (chronic kidney disease), stage III ([place]) 11/22/2015    Priority: Medium  . Bradycardia 07/09/2011    Priority: Medium  . Major depression in remission ([place]) 07/26/2007    Priority: Medium  . 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    Priority: Medium  . Hyperlipidemia 08/23/2006    Priority: Medium  . Essential hypertension 08/23/2006    Priority: Medium  . Actinic keratosis 07/30/2009    Priority: Low  . GERD 08/23/2006    Priority: Low  . Left wrist pain 11/01/2016    Medications- reviewed and updated Current Outpatient Medications  Medication Sig Dispense Refill  . amLODipine (NORVASC) 2.5 MG tablet TAKE 1 TABLET(2.5 MG) BY MOUTH DAILY 90 tablet 1  . apixaban (ELIQUIS) 5 MG TABS tablet TAKE 1 TABLET(5 MG) BY MOUTH TWICE DAILY 180 tablet 1  . escitalopram (LEXAPRO) 20 MG tablet TAKE 1 TABLET(20 MG) BY MOUTH DAILY 90 tablet 1  . Lansoprazole (PREVACID PO) Take 15 mg by mouth daily.    . memantine (NAMENDA) 10 MG tablet TAKE 1 TABLET(10 MG) BY MOUTH DAILY 90 tablet 1  . Multiple Vitamin (MULTIVITAMIN) tablet Take 1 tablet by mouth daily.      . rosuvastatin (CRESTOR) 20 MG tablet Take 1 tablet po qd 90 tablet 2   . tamsulosin (FLOMAX) 0.4 MG CAPS capsule TAKE 1 CAPSULE(0.4 MG) BY MOUTH AT BEDTIME 90 capsule 5  . triamcinolone cream (KENALOG) 0.1 % Apply 1 application topically 2 (two) times daily. For 7-10 days maximum 80 g 0  . triamterene-hydrochlorothiazide (MAXZIDE-25) 37.5-25 MG tablet Take 1 tablet by mouth every morning. 90 tablet 1   No current facility-administered medications for this visit.     Objective: BP 138/86   Pulse (!) 59   Temp 98.1 F (36.7 C) (Oral)   Ht 6' (1.829 m)   Wt 199 lb (90.3 kg)   SpO2 97%   BMI 26.99 kg/m  Gen: NAD, resting comfortably CV: RRR no murmurs rubs or gallops Lungs: CTAB no crackles, wheeze, rhonchi Abdomen: soft/nontender/nondistended/normal bowel sounds. No rebound or guarding.  Ext: bilateral trace edema. Erythema without warmth in lower extremities noted- on left leg this has receeded. Some dry skin but less exociration noted.  Skin: warm, dry Neuro: garbled speehc, normal gait  Assessment/Plan:  Cellulitis follow up Likely related to venous insufficiency and venous stasis skin changes S: Patient was seen last week before Thanksgiving-had a rash on his left lower leg for 10 days- we thought it was most likely irritated venous stasis skin changes but given we were going into a holiday weekend we opted to place him on Keflex twice a day for 7 days to try to be aggressive  about potential early cellulitis.  I also gave him an anti-itch cream called triamcinolone  He and daughter states symptoms have significantly improved.  They even consider canceling visit today.  Itching is still mildly present and he still has some dry skin on the lower legs A/P: Much improved- has completed antibiotics. -discussed could complete full 10 days of triamcinolone and then transition to emollient -We discussed adding compression stockings back in as apparently he has not been using those in a while- offered to write prescription but they state can easily get from  medical supply store  Hypertension S: controlled on repeat on amlodipine 2.5 mg, trampoline hydrochlorthiazide 37.5-25 mg BP Readings from Last 3 Encounters:  01/29/18 138/86  01/22/18 122/70  12/25/17 (!) 162/80  A/P: We discussed blood pressure goal of <140/90. Continue current meds    Future Appointments  Date Time Provider [place]  07/29/2018 11:00 AM [name], MD [facility]   Return precautions advised.  [name], MD

## 2018-02-28 ENCOUNTER — Telehealth: Payer: Self-pay | Admitting: Family Medicine

## 2018-02-28 NOTE — Telephone Encounter (Signed)
Patient is scheduled   

## 2018-02-28 NOTE — Telephone Encounter (Signed)
Copied from [place] [id number]. Topic: Quick Communication - See Telephone Encounter >> Feb 28, 2018  9:56 AM Rutherford Nail, NT wrote: CRM for notification. See Telephone encounter for: 02/28/18. Patient's wife, [name], calling and states that the patient was seen a month ago by Dr Yong Channel for his leg swelling and breaking out in a rash. States that his leg has started to do the same thing and would like to know if Dr Yong Channel could prescribe the medication that he did last time? Patient's wife unsure what the name of the medication was. Please advise. [facility] [place], [address] AT [address]

## 2018-02-28 NOTE — Telephone Encounter (Signed)
Please advise 

## 2018-02-28 NOTE — Telephone Encounter (Signed)
See note

## 2018-02-28 NOTE — Telephone Encounter (Signed)
Can we work him in at 8 40 tomorrow to allow [place] to evaluate?

## 2018-02-28 NOTE — Telephone Encounter (Signed)
Called and spoke to wife who stated patient will be here tomorrow at 11:40.

## 2018-03-01 ENCOUNTER — Ambulatory Visit: Payer: Medicare Other | Admitting: Family Medicine

## 2018-03-01 ENCOUNTER — Encounter: Payer: Self-pay | Admitting: Family Medicine

## 2018-03-01 VITALS — BP 134/82 | HR 56 | Temp 98.0°F | Ht 72.0 in | Wt 205.6 lb

## 2018-03-01 DIAGNOSIS — I8311 Varicose veins of right lower extremity with inflammation: Secondary | ICD-10-CM

## 2018-03-01 DIAGNOSIS — I8312 Varicose veins of left lower extremity with inflammation: Secondary | ICD-10-CM | POA: Diagnosis not present

## 2018-03-01 DIAGNOSIS — L282 Other prurigo: Secondary | ICD-10-CM

## 2018-03-01 MED ORDER — DOXYCYCLINE HYCLATE 100 MG PO TABS: 100.0000 mg | ORAL_TABLET | Freq: Two times a day (BID) | ORAL | 0 refills | Status: AC

## 2018-03-01 MED ORDER — TRIAMCINOLONE ACETONIDE 0.1 % EX CREA: 1.0000 "application " | TOPICAL_CREAM | Freq: Two times a day (BID) | CUTANEOUS | 0 refills | Status: DC

## 2018-03-01 NOTE — Patient Instructions (Addendum)
I think this is actually lipodermatosclerosis- a chronic inflammation that worsens at times from swelling in the lower legs.   Once this calms down we need to restart compression stockings on a daily basis. May want to have several pairs. Generally try to get compression stockings around 20 mmhg  For now I want you to start icing with a bag of peas or ice over a towel on elevated legs ever 3 hours at least for 15 minutes. Try to keep legs close to heart level as able.   Since we are going into the weekend I am going to write for doxycycline if this worsens or is not improving within 48-72 hours. Also would start this if he starts to feel ill overall or develops a fever

## 2018-03-01 NOTE — Progress Notes (Signed)
Subjective:  [name] is a 85 y.o. year old very pleasant male patient who presents for/with See problem oriented charting ROS-no fever chills reported.  No abnormal fatigue reported.  Does have some tenderness and redness in bilateral lower legs.  Does note increased edema.  Not wearing compression stockings  Past Medical History-  Patient Active Problem List   Diagnosis Date Noted  . Memory loss 07/21/2014    Priority: High  . Chronic atrial fibrillation 07/08/2011    Priority: High  . Hemiparesis affecting right side as late effect of cerebrovascular accident ([place]) 03/26/2007    Priority: High  . CAD (coronary artery disease) 08/23/2006    Priority: High  . Venous insufficiency 10/02/2016    Priority: Medium  . Right hydrocele 04/03/2016    Priority: Medium  . Thrombocytopenia ([place]) 11/22/2015    Priority: Medium  . CKD (chronic kidney disease), stage III ([place]) 11/22/2015    Priority: Medium  . Bradycardia 07/09/2011    Priority: Medium  . Major depression in remission ([place]) 07/26/2007    Priority: Medium  . 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    Priority: Medium  . Hyperlipidemia 08/23/2006    Priority: Medium  . Essential hypertension 08/23/2006    Priority: Medium  . Actinic keratosis 07/30/2009    Priority: Low  . GERD 08/23/2006    Priority: Low  . Left wrist pain 11/01/2016    Medications- reviewed and updated Current Outpatient Medications  Medication Sig Dispense Refill  . amLODipine (NORVASC) 2.5 MG tablet TAKE 1 TABLET(2.5 MG) BY MOUTH DAILY 90 tablet 1  . apixaban (ELIQUIS) 5 MG TABS tablet TAKE 1 TABLET(5 MG) BY MOUTH TWICE DAILY 180 tablet 1  . escitalopram (LEXAPRO) 20 MG tablet TAKE 1 TABLET(20 MG) BY MOUTH DAILY 90 tablet 1  . Lansoprazole (PREVACID PO) Take 15 mg by mouth daily.    . memantine (NAMENDA) 10 MG tablet TAKE 1 TABLET(10 MG) BY MOUTH DAILY 90 tablet 1  . Multiple Vitamin (MULTIVITAMIN) tablet Take 1 tablet  by mouth daily.      . rosuvastatin (CRESTOR) 20 MG tablet Take 1 tablet po qd 90 tablet 2  . tamsulosin (FLOMAX) 0.4 MG CAPS capsule TAKE 1 CAPSULE(0.4 MG) BY MOUTH AT BEDTIME 90 capsule 5  . triamcinolone cream (KENALOG) 0.1 % Apply 1 application topically 2 (two) times daily. For 7-10 days maximum 80 g 0  . triamterene-hydrochlorothiazide (MAXZIDE-25) 37.5-25 MG tablet Take 1 tablet by mouth every morning. 90 tablet 1   No current facility-administered medications for this visit.    Objective: BP 134/82 (BP Location: Left Arm, Patient Position: Sitting, Cuff Size: Normal)   Pulse (!) 56   Temp 98 F (36.7 C) (Oral)   Ht 6' (1.829 m)   Wt 205 lb 9.6 oz (93.3 kg)   SpO2 98%   BMI 27.88 kg/m  Gen: NAD, resting comfortably CV: RRR no murmurs rubs or gallops Lungs: CTAB no crackles, wheeze, rhonchi Abdomen: soft/nontender/nondistended Ext: 1+ edema bilaterally worse on the right.  Both lower legs erythematous up to past mid shin.  Only mild warmth and mild tenderness to touch. Neuro: Baseline garbled speech from prior stroke     Assessment/Plan:   Lipodermatosclerosis of both lower extremities  S: Symptoms were noted by patient's wife 2 days ago.  She noted redness on his lower legs.  They called in yesterday requesting antibiotic.  Due to prior bilateral nature of this some concern this was not simply cellulitis  and may be lipodermatosclerosis so I requested an in office evaluation.  Level 5 caveat applies due to patient's garbled speech.  Family helps provide history.  Patient does complain of this itching similarly the last time and requests refill of triamcinolone lotion-he nods to this effect once we stated this out loud.  Right leg at baseline has slightly more edema than the left.  He has not been compliant with his compression stockings since last visit have suggested.  Last visit it was thought venous stasis dermatitis is because-we were going into Thanksgiving weekend so we  also covered with doxycycline in case it was a cellulitic process A/P: Last visit I suspected inflammation/venous stasis dermatitis of lower extremities.  With recurrence so quickly- I am more heavily favoring lipodermatosclerosis especially given cellulitic-like appearance.  Patient did seem to respond reasonably to antibiotic doxycycline last visit but could have been from the anti-inflammatory properties.  Patient was also on triamcinolone and he has some recurrence of the itch so I think it is reasonable to refill that.  Instead of starting with doxycycline immediately-encouraged a several day trial of elevation and icing.  If he fails to improve or symptoms worsen despite this treatment-can use the doxycycline that was sent in again today.  We also discussed the importance of getting back in compression stockings once he has healed.  Of note-vital signs stable today and do not suspect systemic illness.  One reason I am favoring lipodermatosclerosis is the bilateral nature of current process  Future Appointments  Date Time Provider [place]  07/29/2018 11:00 AM [name], MD [facility]    Meds ordered this encounter  Medications  . doxycycline (VIBRA-TABS) 100 MG tablet    Sig: Take 1 tablet (100 mg total) by mouth 2 (two) times daily for 7 days.    Dispense:  14 tablet    Refill:  0  . triamcinolone cream (KENALOG) 0.1 %    Sig: Apply 1 application topically 2 (two) times daily. For 7-10 days maximum    Dispense:  240 g    Refill:  0    Return precautions advised.  [name], MD

## 2018-06-06 ENCOUNTER — Telehealth: Payer: Self-pay

## 2018-06-06 NOTE — Telephone Encounter (Signed)
Called pt to see if an ov can be scheduled for f/u

## 2018-07-03 ENCOUNTER — Ambulatory Visit: Payer: Self-pay

## 2018-07-03 ENCOUNTER — Ambulatory Visit (INDEPENDENT_AMBULATORY_CARE_PROVIDER_SITE_OTHER): Payer: Medicare Other | Admitting: Family Medicine

## 2018-07-03 ENCOUNTER — Encounter: Payer: Self-pay | Admitting: Family Medicine

## 2018-07-03 DIAGNOSIS — R58 Hemorrhage, not elsewhere classified: Secondary | ICD-10-CM

## 2018-07-03 DIAGNOSIS — Z7901 Long term (current) use of anticoagulants: Secondary | ICD-10-CM

## 2018-07-03 NOTE — Telephone Encounter (Signed)
Attempted to contact [name] on DPR reguarding her fathers bump on his leg. Left VM to call office.

## 2018-07-03 NOTE — Progress Notes (Signed)
Phone [phone]   Subjective:  Virtual visit via phonenote No chief complaint on file.   This visit type was conducted due to national recommendations for restrictions regarding the COVID-19 Pandemic (e.g. social distancing).  This format is felt to be most appropriate for this patient at this time balancing risks to patient and risks to population by having him in for in person visit.  All issues noted in this document were discussed and addressed.  No physical exam was performed (except for noted visual exam or audio findings with Telehealth visits).  The patient has consented to conduct a Telehealth visit and understands insurance will be billed.   Our team/I connected with [name] on 07/03/18 at 11:40 AM EDT by phone (patient did not have equipment for webex) and verified that I am speaking with the correct person using two identifiers.  Location patient: [facility] Location provider: [facility], office Persons participating in the virtual visit:  patient, wife who provides entirety of history  Time on phone: 11 minutes Counseling provided about covid 19 risks of coming into office- they decline for bloodwork  Our team/I discussed the limitations of evaluation and management by telemedicine and the availability of in person appointments. In light of current covid-19 pandemic, patient also understands that we are trying to protect them by minimizing in office contact if at all possible.  The patient expressed consent for telemedicine visit and agreed to proceed. Patient understands insurance will be billed.   ROS-level 5 caveat applies due to garbled speech-per wife no chest pain or shortness of breath, no easy bruising or bleeding outside of the lower legs, does have intermittent swelling in lower legs  Past Medical History-  Patient Active Problem List   Diagnosis Date Noted  . Memory loss 07/21/2014    Priority: High  . Chronic atrial fibrillation 07/08/2011    Priority:  High  . Hemiparesis affecting right side as late effect of cerebrovascular accident ([place]) 03/26/2007    Priority: High  . CAD (coronary artery disease) 08/23/2006    Priority: High  . Venous insufficiency 10/02/2016    Priority: Medium  . Right hydrocele 04/03/2016    Priority: Medium  . Thrombocytopenia ([place]) 11/22/2015    Priority: Medium  . CKD (chronic kidney disease), stage III ([place]) 11/22/2015    Priority: Medium  . Bradycardia 07/09/2011    Priority: Medium  . Major depression in remission ([place]) 07/26/2007    Priority: Medium  . 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    Priority: Medium  . Hyperlipidemia 08/23/2006    Priority: Medium  . Essential hypertension 08/23/2006    Priority: Medium  . Actinic keratosis 07/30/2009    Priority: Low  . GERD 08/23/2006    Priority: Low  . Left wrist pain 11/01/2016    Medications- reviewed and updated Current Outpatient Medications  Medication Sig Dispense Refill  . amLODipine (NORVASC) 2.5 MG tablet TAKE 1 TABLET(2.5 MG) BY MOUTH DAILY 90 tablet 1  . apixaban (ELIQUIS) 5 MG TABS tablet TAKE 1 TABLET(5 MG) BY MOUTH TWICE DAILY 180 tablet 1  . escitalopram (LEXAPRO) 20 MG tablet TAKE 1 TABLET(20 MG) BY MOUTH DAILY 90 tablet 1  . Lansoprazole (PREVACID PO) Take 15 mg by mouth daily.    . memantine (NAMENDA) 10 MG tablet TAKE 1 TABLET(10 MG) BY MOUTH DAILY 90 tablet 1  . Multiple Vitamin (MULTIVITAMIN) tablet Take 1 tablet by mouth daily.      . rosuvastatin (CRESTOR) 20 MG tablet  Take 1 tablet po qd 90 tablet 2  . tamsulosin (FLOMAX) 0.4 MG CAPS capsule TAKE 1 CAPSULE(0.4 MG) BY MOUTH AT BEDTIME 90 capsule 5  . triamcinolone cream (KENALOG) 0.1 % Apply 1 application topically 2 (two) times daily. For 7-10 days maximum 240 g 0  . triamterene-hydrochlorothiazide (MAXZIDE-25) 37.5-25 MG tablet Take 1 tablet by mouth every morning. 90 tablet 1   No current facility-administered medications for this visit.       Objective:  Nonlabored voice, garbled speech per baseline     Assessment and Plan   #Bleeding leg S: patient with history of venous stasis dermatitis- last week one on the left leg started bleeding. Today, the right leg started bleeding in similar fashion. They have been able to stop the bleeding usually within 5 to 15 minutes. He is able to get out and about now.  Does not report feeling poorly  Due to his aphagia- not able to describe what happened when bleeding starts. In quarantine at his independent living facility but wife doesn't see everything he does still.   A/P: I suspect patient is prone to easy bruising/bleeding on the lower legs in particular due to long-term use of Eliquis.  We discussed possibly reducing to 2.5 mg twice a day but he only qualifies on 1 of 3 measures with age over 60.  Last creatinine was under 1.5.  I do think his baseline venous insufficiency likely predisposes him to bleeding as well I think if he would consistently wear compression stockings it may reduce his risk of bleeding.  We discussed ongoing conservative care if bleeding starts with pressure and if persist beyond 15 minutes-could consider calling our office.  I suggested updating labs but with quarantine at their his independent living facility-they state they are not able to do that at present-we will consider at a later date or would certainly press for this if worsening issues  Lab/Order associations: Bleeding  Anticoagulant long-term use  Return precautions advised.  [name], MD

## 2018-07-03 NOTE — Telephone Encounter (Signed)
Called [name] back. Reports Dr. Ansel Bong nurse had already called her, and Dr. Yong Channel was going to review pt.'s chart and call his wife.

## 2018-07-03 NOTE — Patient Instructions (Signed)
There are no preventive care reminders to display for this patient.  Depression screen [facility] 2/9 10/05/2017 05/21/2017 06/01/2016  Decreased Interest 0 0 0  Down, Depressed, Hopeless 0 0 0  PHQ - 2 Score 0 0 0  Altered sleeping - 0 -  Tired, decreased energy - 1 -  Change in appetite - 0 -  Feeling bad or failure about yourself  - 0 -  Trouble concentrating - 0 -  Moving slowly or fidgety/restless - 0 -  Suicidal thoughts - 0 -  PHQ-9 Score - 1 -  Difficult doing work/chores - Somewhat difficult -

## 2018-07-24 ENCOUNTER — Other Ambulatory Visit: Payer: Self-pay

## 2018-07-24 ENCOUNTER — Telehealth: Payer: Self-pay | Admitting: Family Medicine

## 2018-07-24 DIAGNOSIS — C44622 Squamous cell carcinoma of skin of right upper limb, including shoulder: Secondary | ICD-10-CM | POA: Insufficient documentation

## 2018-07-24 DIAGNOSIS — D0461 Carcinoma in situ of skin of right upper limb, including shoulder: Secondary | ICD-10-CM | POA: Insufficient documentation

## 2018-07-24 MED ORDER — TRIAMCINOLONE ACETONIDE 0.1 % EX CREA: 1.0000 "application " | TOPICAL_CREAM | Freq: Two times a day (BID) | CUTANEOUS | 0 refills | Status: DC

## 2018-07-24 NOTE — Telephone Encounter (Signed)
See note

## 2018-07-24 NOTE — Telephone Encounter (Signed)
Copied from [place] [phone]. Topic: Quick Communication - Rx Refill/Question >> Jul 24, 2018 12:13 PM Reyne Dumas L wrote: Medication: triamcinolone cream (KENALOG) 0.1 %  Has the patient contacted their pharmacy? Yes - needs new refill (Agent: If no, request that the patient contact the pharmacy for the refill.) (Agent: If yes, when and what did the pharmacy advise?)  Preferred Pharmacy (with phone number or street name):   [facility] [place], [address] AT [address] [phone] (Phone) [phone] (Fax)  Agent: Please be advised that RX refills may take up to 3 business days. We ask that you follow-up with your pharmacy.

## 2018-07-24 NOTE — Telephone Encounter (Signed)
Last fill 03/01/18  #240g/0

## 2018-07-29 ENCOUNTER — Encounter: Payer: Medicare Other | Admitting: Family Medicine

## 2018-08-17 ENCOUNTER — Other Ambulatory Visit: Payer: Self-pay | Admitting: Family Medicine

## 2018-08-19 ENCOUNTER — Other Ambulatory Visit: Payer: Self-pay | Admitting: Family Medicine

## 2018-09-24 ENCOUNTER — Other Ambulatory Visit: Payer: Self-pay | Admitting: Family Medicine

## 2018-10-21 ENCOUNTER — Telehealth: Payer: Self-pay | Admitting: Family Medicine

## 2018-10-21 NOTE — Telephone Encounter (Signed)
Called patient to schedule AWV, no answer. Will try to contact patient at a later time. SF

## 2018-12-09 ENCOUNTER — Other Ambulatory Visit: Payer: Self-pay | Admitting: Family Medicine

## 2018-12-25 ENCOUNTER — Ambulatory Visit (HOSPITAL_COMMUNITY): Payer: Medicare Other | Admitting: Nurse Practitioner

## 2018-12-26 ENCOUNTER — Encounter: Payer: Medicare Other | Admitting: Family Medicine

## 2018-12-26 ENCOUNTER — Encounter: Payer: Self-pay | Admitting: Family Medicine

## 2018-12-26 ENCOUNTER — Other Ambulatory Visit: Payer: Self-pay

## 2018-12-26 ENCOUNTER — Ambulatory Visit (INDEPENDENT_AMBULATORY_CARE_PROVIDER_SITE_OTHER): Payer: Medicare Other | Admitting: Family Medicine

## 2018-12-26 VITALS — BP 178/78 | HR 64 | Temp 98.2°F | Ht 72.0 in | Wt 203.0 lb

## 2018-12-26 DIAGNOSIS — N138 Other obstructive and reflux uropathy: Secondary | ICD-10-CM

## 2018-12-26 DIAGNOSIS — I69351 Hemiplegia and hemiparesis following cerebral infarction affecting right dominant side: Secondary | ICD-10-CM

## 2018-12-26 DIAGNOSIS — I482 Chronic atrial fibrillation, unspecified: Secondary | ICD-10-CM | POA: Diagnosis not present

## 2018-12-26 DIAGNOSIS — I2510 Atherosclerotic heart disease of native coronary artery without angina pectoris: Secondary | ICD-10-CM

## 2018-12-26 DIAGNOSIS — Z0000 Encounter for general adult medical examination without abnormal findings: Secondary | ICD-10-CM | POA: Diagnosis not present

## 2018-12-26 DIAGNOSIS — N183 Chronic kidney disease, stage 3 unspecified: Secondary | ICD-10-CM

## 2018-12-26 DIAGNOSIS — Z79899 Other long term (current) drug therapy: Secondary | ICD-10-CM

## 2018-12-26 DIAGNOSIS — E785 Hyperlipidemia, unspecified: Secondary | ICD-10-CM | POA: Diagnosis not present

## 2018-12-26 DIAGNOSIS — D692 Other nonthrombocytopenic purpura: Secondary | ICD-10-CM

## 2018-12-26 DIAGNOSIS — R413 Other amnesia: Secondary | ICD-10-CM

## 2018-12-26 DIAGNOSIS — N401 Enlarged prostate with lower urinary tract symptoms: Secondary | ICD-10-CM

## 2018-12-26 DIAGNOSIS — D696 Thrombocytopenia, unspecified: Secondary | ICD-10-CM

## 2018-12-26 DIAGNOSIS — F325 Major depressive disorder, single episode, in full remission: Secondary | ICD-10-CM

## 2018-12-26 DIAGNOSIS — K219 Gastro-esophageal reflux disease without esophagitis: Secondary | ICD-10-CM

## 2018-12-26 DIAGNOSIS — I10 Essential (primary) hypertension: Secondary | ICD-10-CM

## 2018-12-26 LAB — LIPID PANEL
Cholesterol: 138 mg/dL (ref 0–200)
HDL: 61.4 mg/dL (ref >39.00)
LDL Cholesterol: 64 mg/dL (ref 0–99)
NonHDL: 76.9
Total CHOL/HDL Ratio: 2
Triglycerides: 66 mg/dL (ref 0.0–149.0)
VLDL: 13.2 mg/dL (ref 0.0–40.0)

## 2018-12-26 LAB — CBC
HCT: 35.1 % — ABNORMAL LOW (ref 39.0–52.0)
Hemoglobin: 11.8 g/dL — ABNORMAL LOW (ref 13.0–17.0)
MCHC: 33.6 g/dL (ref 30.0–36.0)
MCV: 95.3 fl (ref 78.0–100.0)
Platelets: 133 10*3/uL — ABNORMAL LOW (ref 150.0–400.0)
RBC: 3.68 Mil/uL — ABNORMAL LOW (ref 4.22–5.81)
RDW: 15.3 % (ref 11.5–15.5)
WBC: 6.7 10*3/uL (ref 4.0–10.5)

## 2018-12-26 LAB — COMPREHENSIVE METABOLIC PANEL
ALT: 29 U/L (ref 0–53)
AST: 22 U/L (ref 0–37)
Albumin: 4.3 g/dL (ref 3.5–5.2)
Alkaline Phosphatase: 120 U/L — ABNORMAL HIGH (ref 39–117)
BUN: 36 mg/dL — ABNORMAL HIGH (ref 6–23)
CO2: 25 mEq/L (ref 19–32)
Calcium: 9 mg/dL (ref 8.4–10.5)
Chloride: 104 mEq/L (ref 96–112)
Creatinine, Ser: 1.56 mg/dL — ABNORMAL HIGH (ref 0.40–1.50)
GFR: 42.2 mL/min — ABNORMAL LOW (ref >60.00)
Glucose, Bld: 91 mg/dL (ref 70–99)
Potassium: 4.2 mEq/L (ref 3.5–5.1)
Sodium: 141 mEq/L (ref 135–145)
Total Bilirubin: 0.7 mg/dL (ref 0.2–1.2)
Total Protein: 6.8 g/dL (ref 6.0–8.3)

## 2018-12-26 MED ORDER — AMLODIPINE BESYLATE 2.5 MG PO TABS: ORAL_TABLET | ORAL | 1 refills | Status: DC

## 2018-12-26 MED ORDER — TRIAMTERENE-HCTZ 37.5-25 MG PO TABS: 1.0000 | ORAL_TABLET | Freq: Every morning | ORAL | 1 refills | Status: DC

## 2018-12-26 NOTE — Patient Instructions (Addendum)
Health Maintenance Due  Topic Date Due  . TETANUS/TDAP.  Consider getting it at your pharmacy.  Also please check with your pharmacy to see if they have the shingrix vaccine. If they do- please get this immunization and update [place] by phone call or mychart with dates you receive the vaccine  02/28/2015  . INFLUENZA VACCINE thinks he had at at pharmacy will call and check  09/28/2018   Before you leave- please cancel December visit for physical  Blood pressure is very high- from prescription records its not clear that he is on medicines 1. Amlodipine 2.5mg  2. Triamterene- hctz 37.5-25 mg.   Call us today or tomorrow to let us know  Would be great if you could go in with your Dad (if facility allows) to verify med list)   Schedule 2-3 week follow up and bring ALL MEDICINES with you UNLESS cardiology is able to confirm current meds and blood pressure is better  Cancel December physical that looks like was scheduled yesterday  Please consider dentist visit   Please stop by lab before you go If you do not have mychart- we will call you about results within 5 business days of [place] receiving them.  If you have mychart- we will send your results within 3 business days of [place] receiving them.  If abnormal or we want to clarify a result, we will call or mychart you to make sure you receive the message.  If you have questions or concerns or don't hear within 5-7 days, please send [place] a message or call us.

## 2018-12-26 NOTE — Progress Notes (Signed)
Phone: [phone]   Subjective:  Patient presents today for their annual physical. Chief complaint-noted.   See problem oriented charting- ROS- level 5 caveat due to garbled speech/memory loss- also not clearly consistent in writing so was not able to use that method  The following were reviewed and entered/updated in epic: Past Medical History:  Diagnosis Date  . Actinic keratosis   . BPH with urinary obstruction   . Bradycardia   . CAD (coronary artery disease)    CABG x5  . Cerebrovascular accident (stroke) ([place])    With right hemiparesis and persistent expressive aphasia  . GERD (gastroesophageal reflux disease)   . History of hydronephrosis   . Hx of hydronephrosis 07/08/2011   right   . Hyperlipidemia   . Hypertension   . Iron deficiency anemia due to chronic blood loss 03/26/2007   Now resolved    . Iron deficiency anemia secondary to blood loss (chronic)   . Persistent atrial fibrillation ([place])   . Pulmonary hypertension ([place])    by echo 2013  . Rosacea   . Situational depression    Severe  . 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 Tricuspid regurgitation    Patient Active Problem List   Diagnosis Date Noted  . Memory loss 07/21/2014    Priority: High  . Chronic atrial fibrillation ([place]) 07/08/2011    Priority: High  . Hemiparesis affecting right side as late effect of cerebrovascular accident ([place]) 03/26/2007    Priority: High  . CAD (coronary artery disease) 08/23/2006    Priority: High  . Venous insufficiency 10/02/2016    Priority: Medium  . Right hydrocele 04/03/2016    Priority: Medium  . Thrombocytopenia ([place]) 11/22/2015    Priority: Medium  . CKD (chronic kidney disease), stage III 11/22/2015    Priority: Medium  . Bradycardia 07/09/2011    Priority: Medium  . Major depression in remission ([place])  07/26/2007    Priority: Medium  . 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    Priority: Medium  . Hyperlipidemia 08/23/2006    Priority: Medium  . Essential hypertension 08/23/2006    Priority: Medium  . Actinic keratosis 07/30/2009    Priority: Low  . GERD 08/23/2006    Priority: Low  . 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 Left wrist pain 11/01/2016   Past Surgical History:  Procedure Laterality Date  . ABDOMINAL SURGERY    . BACK SURGERY    . BAND HEMORRHOIDECTOMY    . CHOLECYSTECTOMY    . CORONARY ARTERY BYPASS GRAFT     x 5  . dental implants    . HERNIA REPAIR    . KIDNEY SURGERY     stenosis with stent  . TONSILLECTOMY      Family History  Problem Relation Age of Onset  . Heart disease Father   . Heart attack Other        fhx  . Coronary artery disease Other        fhx    Medications- reviewed and updated Current Outpatient Medications  Medication Sig Dispense Refill  . amLODipine (NORVASC) 2.5 MG tablet TAKE 1 TABLET(2.5 MG) BY MOUTH DAILY 90 tablet 1  . ELIQUIS 5 MG TABS tablet  TAKE 1 TABLET(5 MG) BY MOUTH TWICE DAILY 180 tablet 1  . escitalopram (LEXAPRO) 20 MG tablet TAKE 1 TABLET(20 MG) BY MOUTH DAILY 90 tablet 1  . Lansoprazole (PREVACID PO) Take 15 mg by mouth daily.    . memantine (NAMENDA) 10 MG tablet TAKE 1 TABLET(10 MG) BY MOUTH DAILY 90 tablet 1  . Multiple Vitamin (MULTIVITAMIN) tablet Take 1 tablet by mouth daily.      . rosuvastatin (CRESTOR) 20 MG tablet TAKE 1 TABLET(20 MG) BY MOUTH DAILY 90 tablet 2  . tamsulosin (FLOMAX) 0.4 MG CAPS capsule TAKE 1 CAPSULE(0.4 MG) BY MOUTH AT BEDTIME 90 capsule 5  . triamcinolone cream (KENALOG) 0.1 % Apply 1 application topically 2 (two) times daily. For 7-10 days maximum 240 g 0  . triamterene-hydrochlorothiazide (MAXZIDE-25) 37.5-25 MG tablet Take 1 tablet by mouth every morning. 90 tablet 1   No current  facility-administered medications for this visit.     Allergies-reviewed and updated Allergies  Allergen Reactions  . Ciprofloxacin Hcl     unknown  . Sulfamethoxazole-Trimethoprim     unknown    Social History   Social History Narrative   Lives in [place] with spouse. LIves at a house at a retirement home. 3 children. 6 grandkids. 5 greatgrandkids.       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 TM normal. Bridge of nose normal. Eyelids normal.  Neck: no thyromegaly or cervical lymphadenopathy  CV: irregularly irregular no murmurs rubs or gallops Lungs: CTAB no crackles, wheeze, rhonchi Abdomen: soft/nontender/nondistended/normal bowel sounds. No rebound or guarding.  Ext: 2+ on right, 1+ on left edema Skin: warm, dry Neuro: grossly normal, moves all extremities, PERRLA   Assessment and Plan  85 y.o. male presenting for annual physical.  Health Maintenance counseling: 1. Anticipatory guidance: Patient counseled regarding regular dental exams he has not been in over a year- encouraged follow up, eye exams yearly next appointment in November ,  avoiding smoking and second hand smoke , limiting alcohol to 2 beverages per day, has a glass of wine at 5pm every day- would be cautious with memory issues.   2. Risk factor reduction:  Advised patient of need for regular exercise and diet rich and fruits and vegetables to reduce risk of heart attack and stroke. Exercise-tries to stay active. [place] around the store. Not able to do a lot due to limitations at retirement home.  Diet-He limits snacking and meals are provided by retirement home.  Wt Readings from Last 3 Encounters:  12/26/18 203 lb (92.1 kg)   03/01/18 205 lb 9.6 oz (93.3 kg)  01/29/18 199 lb (90.3 kg)  3. Immunizations/screenings/ancillary studies-already had flu shot at outside pharmacy.  Discussed tetanus shot and shingles shot at pharmacy- he is going to consider shingrix.   Immunization History  Administered Date(s) Administered  . Influenza Split 01/09/2011  . Influenza Whole 02/28/2003, 12/18/2007, 11/24/2009  . Influenza, High Dose Seasonal PF 11/22/2015, 01/22/2018  . Influenza-Unspecified 11/27/2016  . Pneumococcal Conjugate-13 07/21/2014  . Pneumococcal Polysaccharide-23 02/28/2004  . Td 02/27/2005  4. Prostate cancer screening- past age to based screening recommendations  Lab Results  Component Value Date   PSA 0.47 08/11/2013   PSA 0.52 04/26/2007   5. Colon cancer screening - had not had in several years. No blood in stools or constipation.  Past age based screening recommendations 6. Skin cancer screening- followed by dermatology mianly as needed. advised regular sunscreen use.He is going to schedule follow up due to some possible AKs 7. History of smoking over 50 years ago-no regular screening required  Status of chronic or acute concerns   Hemiparesis affecting right side as late effect of cerebrovascular accident (HCC)-stable right-sided hemiparesis after stroke in 2003 likely related to atrial fibrillation.   Memory loss-had been on Namenda in the past through Dr. Urban Gibson on this for possible vascular dementia.  Memory loss and garbled speech after prior stroke.  Stable-continue to monitor  Chronic atrial fibrillation (HCC)-anticoagulated with Eliquis 5 mg twice daily.  Not on rate control medication it remains rate controlled   Some edema bilaterally- wonder if being off eliquis contributes. Weight down 2 lbs from last in office visit.   Coronary artery disease involving native coronary artery of native heart without angina pectoris-remains asymptomatic.  He remains on Crestor 20 mg-has been on  Eliquis alone without aspirin- looks like this was changed 09/03/13 by cardiology where aspirin stopped.    Thrombocytopenia (HCC)-not noted on last exam but will update today to make sure stable .  Mild anemia on last check. Also easy bruising/bleeding on elqius  Major depression in remission (HCC)-suspect remains controlled on Lexapro but difficult to monitor due to garbled speech and memory issues.    Hyperlipidemia, unspecified hyperlipidemia type-compliant with Crestor 20 mg.  Update lipid panel today   Essential hypertension-in the past compliant with triamterene hydrochlorothiazide 37.5-25 mg and amlodipine 2.5 mg- BP is very high today but sounds lke may be off meds- we refilled these and sees cardiolgoy next week- if has chest pain, shortness of breath, headache, blurry vision- should seek care.   BP Readings from Last 3 Encounters:  03/01/18 134/82  01/29/18 138/86  01/22/18 122/70   Stage 3 chronic kidney disease, unspecified whether stage 3a or 3b CKD-we will update BMP today.  Suspect stable.- update cmp   BPH with obstruction/lower urinary tract symptoms-remains on Flomax with reasonable control it sounds like- would love to have him bring all meds.    Gastroesophageal reflux disease without esophagitis-remains on Prevacid.  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    Recommended follow up: 2-3 week BP recheck Future Appointments  Date Time Provider [place]  01/01/2019 10:30 AM Sherran Needs, NP [facility] None  02/12/2019  9:20 AM [name], MD [facility]   Lab/Order associations:not fasting   ICD-10-CM   1. Preventative health care  Z00.00   2. Hemiparesis affecting right side as late effect of cerebrovascular accident ([place])  I69.351   3. Memory loss  R41.3   4. Chronic atrial fibrillation (HCC)  I48.20   5. Coronary artery disease involving native coronary  artery of native heart without angina pectoris  I25.10   6. Thrombocytopenia ([place])  D69.6   7. Major depression in remission ([place])  F32.5   8. Hyperlipidemia, unspecified hyperlipidemia type  E78.5   9. Essential hypertension  I10   10. Stage 3 chronic kidney disease, unspecified whether stage 3a or 3b CKD  N18.30   11. BPH with obstruction/lower  urinary tract symptoms  N40.1    N13.8   12. Gastroesophageal reflux disease without esophagitis  K21.9     No orders of the defined types were placed in this encounter.   Return precautions advised.  [name], MD

## 2018-12-27 ENCOUNTER — Telehealth: Payer: Self-pay | Admitting: Family Medicine

## 2018-12-27 LAB — VITAMIN B12: Vitamin B-12: 408 pg/mL (ref 211–911)

## 2018-12-27 LAB — TSH: TSH: 3.75 u[IU]/mL (ref 0.35–4.50)

## 2018-12-27 NOTE — Telephone Encounter (Signed)
FYI

## 2018-12-27 NOTE — Telephone Encounter (Signed)
See note  Copied from [place] [phone]. Topic: General - Other >> Dec 27, 2018 11:29 AM Yvette Rack wrote: Reason for CRM: Pt daughter [name] called to report that pt has been taking the  amLODipine (NORVASC) 2.5 MG tablet but she found a bottle of the triamterene-hydrochlorothiazide (MAXZIDE-25) 37.5-25 MG tablet from a year ago so she can only assume that pt has not been taking it.

## 2018-12-27 NOTE — Telephone Encounter (Signed)
Please have him restart the triamterene hydrochlorothiazide-has a visit with cardiology next week and hopefully blood pressure is improving by then.  If still elevated then please let me know if cardiology makes any adjustments

## 2018-12-30 ENCOUNTER — Other Ambulatory Visit: Payer: Self-pay

## 2018-12-30 DIAGNOSIS — D649 Anemia, unspecified: Secondary | ICD-10-CM

## 2018-12-30 NOTE — Telephone Encounter (Signed)
Called pt daughter and gave her Dr. Yong Channel recommendations

## 2019-01-01 ENCOUNTER — Encounter (HOSPITAL_COMMUNITY): Payer: Self-pay | Admitting: Nurse Practitioner

## 2019-01-01 ENCOUNTER — Other Ambulatory Visit: Payer: Self-pay

## 2019-01-01 ENCOUNTER — Ambulatory Visit (HOSPITAL_COMMUNITY)
Admission: RE | Admit: 2019-01-01 | Discharge: 2019-01-01 | Disposition: A | Discharge status: Home / Self Care | Payer: Medicare Other | Source: Ambulatory Visit | Attending: Nurse Practitioner | Admitting: Nurse Practitioner

## 2019-01-01 VITALS — BP 160/80 | HR 49 | Ht 72.0 in | Wt 199.0 lb

## 2019-01-01 DIAGNOSIS — R9431 Abnormal electrocardiogram [ECG] [EKG]: Secondary | ICD-10-CM | POA: Diagnosis not present

## 2019-01-01 DIAGNOSIS — R001 Bradycardia, unspecified: Secondary | ICD-10-CM | POA: Insufficient documentation

## 2019-01-01 DIAGNOSIS — R609 Edema, unspecified: Secondary | ICD-10-CM | POA: Insufficient documentation

## 2019-01-01 DIAGNOSIS — Z87891 Personal history of nicotine dependence: Secondary | ICD-10-CM | POA: Diagnosis not present

## 2019-01-01 DIAGNOSIS — I10 Essential (primary) hypertension: Secondary | ICD-10-CM | POA: Diagnosis not present

## 2019-01-01 DIAGNOSIS — I4821 Permanent atrial fibrillation: Secondary | ICD-10-CM | POA: Diagnosis not present

## 2019-01-01 DIAGNOSIS — Z8249 Family history of ischemic heart disease and other diseases of the circulatory system: Secondary | ICD-10-CM | POA: Insufficient documentation

## 2019-01-01 DIAGNOSIS — Z951 Presence of aortocoronary bypass graft: Secondary | ICD-10-CM | POA: Insufficient documentation

## 2019-01-01 DIAGNOSIS — I071 Rheumatic tricuspid insufficiency: Secondary | ICD-10-CM | POA: Diagnosis not present

## 2019-01-01 DIAGNOSIS — Z881 Allergy status to other antibiotic agents status: Secondary | ICD-10-CM | POA: Diagnosis not present

## 2019-01-01 DIAGNOSIS — I69320 Aphasia following cerebral infarction: Secondary | ICD-10-CM | POA: Diagnosis not present

## 2019-01-01 DIAGNOSIS — I25810 Atherosclerosis of coronary artery bypass graft(s) without angina pectoris: Secondary | ICD-10-CM | POA: Insufficient documentation

## 2019-01-01 DIAGNOSIS — Z85828 Personal history of other malignant neoplasm of skin: Secondary | ICD-10-CM | POA: Diagnosis not present

## 2019-01-01 DIAGNOSIS — I4811 Longstanding persistent atrial fibrillation: Secondary | ICD-10-CM | POA: Insufficient documentation

## 2019-01-01 DIAGNOSIS — Z882 Allergy status to sulfonamides status: Secondary | ICD-10-CM | POA: Diagnosis not present

## 2019-01-01 DIAGNOSIS — D500 Iron deficiency anemia secondary to blood loss (chronic): Secondary | ICD-10-CM | POA: Diagnosis not present

## 2019-01-01 DIAGNOSIS — I272 Pulmonary hypertension, unspecified: Secondary | ICD-10-CM | POA: Diagnosis not present

## 2019-01-01 DIAGNOSIS — Z9049 Acquired absence of other specified parts of digestive tract: Secondary | ICD-10-CM | POA: Insufficient documentation

## 2019-01-01 MED ORDER — APIXABAN 2.5 MG PO TABS: 2.5000 mg | ORAL_TABLET | Freq: Two times a day (BID) | ORAL | 6 refills | Status: DC

## 2019-01-01 NOTE — Progress Notes (Signed)
[name], male   DOB: 1928-09-28, 85 y.o.   MRN: [id number]       Primary Care Physician: [name], MD Primary Electrophysiologist: Dr. Rachelle Hora is a 85 y.o. male with a h/o long standing persistent atrial fibrillation who presents for f/u in the [facility]. The patient is aphasic due to previous stroke and is here with his daughter. He is off chronotropic drugs due to bradycardia ,he has a v rate of 49 bpm today. Daughter does not think he has been as active lately, but he does drive and goes to the grocery store.  Taking eliquis without bleeding issues with a chadsvasc of at least 6. His last bmet was 1.56 prior to that 1.39 and 1.76, so I feel it would be best to  reduce eliquis  to 2.5 mg bid today. He is mildly anemic as well and the daughter will be picking up a stool  kit later today.He is noted to have an elevation of BP today with increased foot edema. His daughter noted that he has not been taking triamterene/hctz.He does live in  an assisted nursing facility with his wife and takes  his own meds.   Today, he denies symptoms of palpitations, chest pain, shortness of breath, orthopnea, PND,  dizziness, presyncope, syncope, snoring, daytime somnolence, bleeding, or neurologic sequela..The patient is tolerating medications without difficulties and is otherwise without complaint today.    Past Medical History:  Diagnosis Date  . Actinic keratosis   . BPH with urinary obstruction   . Bradycardia   . CAD (coronary artery disease)    CABG x5  . Cerebrovascular accident (stroke) ([place])    With right hemiparesis and persistent expressive aphasia  . GERD (gastroesophageal reflux disease)   . History of hydronephrosis   . Hx of hydronephrosis 07/08/2011   right   . Hyperlipidemia   . Hypertension   . Iron deficiency anemia due to chronic blood loss 03/26/2007   Now resolved    . Iron deficiency anemia secondary to blood  loss (chronic)   . Persistent atrial fibrillation ([place])   . Pulmonary hypertension ([place])    by echo 2013  . Rosacea   . Situational depression    Severe  . 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 Tricuspid regurgitation    Past Surgical History:  Procedure Laterality Date  . ABDOMINAL SURGERY    . BACK SURGERY    . BAND HEMORRHOIDECTOMY    . CHOLECYSTECTOMY    . CORONARY ARTERY BYPASS GRAFT     x 5  . dental implants    . HERNIA REPAIR    . KIDNEY SURGERY     stenosis with stent  . TONSILLECTOMY        Allergies  Allergen Reactions  . Ciprofloxacin Hcl     unknown  . Sulfamethoxazole-Trimethoprim     unknown    Social History   Socioeconomic History  . Marital status: Married    Spouse name: Not on file  . Number of children: Not on file  . Years of education: Not on file  . Highest education level: Not on file  Occupational History  . Not on file  Social Needs  . Financial resource strain: Not on file  . Food  insecurity    Worry: Not on file    Inability: Not on file  . Transportation needs    Medical: Not on file    Non-medical: Not on file  Tobacco Use  . Smoking status: Former Research scientist (life sciences)  . Smokeless tobacco: Never Used  . Tobacco comment: x 85 yo per the dtr  Substance and Sexual Activity  . Alcohol use: Yes    Alcohol/week: 1.0 standard drinks    Types: 1 Glasses of wine per week    Comment: glass of wine at 5  . Drug use: No  . Sexual activity: Not Currently  Lifestyle  . Physical activity    Days per week: Not on file    Minutes per session: Not on file  . Stress: Not on file  Relationships  . Social Herbalist on phone: Not on file    Gets together: Not on file    Attends religious service: Not on file    Active member of club or organization: Not on file    Attends meetings of clubs  or organizations: Not on file    Relationship status: Not on file  . Intimate partner violence    Fear of current or ex partner: Not on file    Emotionally abused: Not on file    Physically abused: Not on file    Forced sexual activity: Not on file  Other Topics Concern  . Not on file  Social History Narrative   Lives in [place] with spouse. LIves at a house at a retirement home. 3 children. 6 grandkids. 5 greatgrandkids.       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 Heart disease Father   . Heart attack Other        fhx  . Coronary artery disease Other        fhx   The patient does not have a history of early familial atrial fibrillation or other arrhythmias.  ROS- All systems are reviewed and negative except as per the HPI above.  Physical Exam: GEN- The patient is well appearing, alert and oriented x 3 today, aphasic   Head- normocephalic, atraumatic Eyes-  Sclera clear, conjunctiva pink Ears- hearing intact Oropharynx- clear Neck- supple, no JVP Lymph- no cervical lymphadenopathy Lungs- Clear to ausculation bilaterally, normal work of breathing Heart- Irregular rate and rhythm, no murmurs, rubs or gallops, PMI not laterally displaced GI- soft, NT, ND, + BS Extremities- no clubbing, cyanosis, or trace edema on rt, 1t on left (prior cabg) MS- no significant deformity or atrophy Skin- no rash or lesion Psych- euthymic mood, full affect Neuro- strength and sensation are intact  Ekg today shows afib at 49 bpm, LAD Holter monitor 02/2016-Study Highlights   Atrial fibrillation with controlled ventricular rates Average heart rate 58 bpm, max heart rate 98 bpm Nocturnal pauses of up to 3.2 seconds are noted,  No daytime pauses Occasional premature ventricular contractions with rare nonsustained ventricular tachycardia      Assessment and Plan:  1. Atrial  fibrillation The patient has asymptomatic longstanding persistent atrial fibrillation with slow ventricular response,  v rates in the 40's today Will place a week zio patch to look for any evidence of PPM indication Monitor placed in January 2018 did  not show indication for PPM.  Creatinine 10/29 at 1.56, preceded with 1.4 and then  1.76, I will reduce eliquis to 2.5 mg bid as he is also over 80 and with current creatinine over 1.5, plts have dropped as well   2. Previous CVA with aphasia Continue Eliquis with reduction in dose with chadsvasc of at least 6.  3. Bradycardia  Zio patch x one week placed  4. Mild anemia Rbc at 3.68, HGB, 11.8, HCT 35.1, plts 133 Per pcp Stool card as been ordered  5. HTN/edema Has not been taking triamterene/hctz as an Building services engineer drug   F/u in 6 months Sooner if  monitor shows change tin treatment is needed   Butch Penny C. Juni Glaab, [facility] [address] [place], [place] [address] [phone]

## 2019-01-01 NOTE — Patient Instructions (Signed)
Decrease Eliquis 2.5mg  twice a day

## 2019-01-13 DIAGNOSIS — H353132 Nonexudative age-related macular degeneration, bilateral, intermediate dry stage: Secondary | ICD-10-CM | POA: Diagnosis not present

## 2019-01-16 ENCOUNTER — Telehealth: Payer: Self-pay | Admitting: Family Medicine

## 2019-01-16 NOTE — Telephone Encounter (Signed)
Patient daughter came in and just wanted to let Dr. Yong Channel know that her father will not do the Stool Kit that he received.

## 2019-01-16 NOTE — Telephone Encounter (Signed)
FYI

## 2019-01-16 NOTE — Telephone Encounter (Signed)
Please tell him we could be missing colon cancer potentially with ongoing anemia.  Lets at least get another CBC with differential under anemia unspecified

## 2019-01-21 ENCOUNTER — Other Ambulatory Visit: Payer: Self-pay

## 2019-01-21 DIAGNOSIS — D649 Anemia, unspecified: Secondary | ICD-10-CM

## 2019-01-21 NOTE — Telephone Encounter (Signed)
Called and spoke with pt daughter and she states she is not sure why pt does not want to do this stool kit. He is in agreement with having the cbc drawn, lab ordered and appointment scheduled.

## 2019-01-27 ENCOUNTER — Other Ambulatory Visit: Payer: Self-pay

## 2019-01-27 ENCOUNTER — Other Ambulatory Visit (INDEPENDENT_AMBULATORY_CARE_PROVIDER_SITE_OTHER): Payer: Medicare Other

## 2019-01-27 ENCOUNTER — Telehealth: Payer: Self-pay | Admitting: Family Medicine

## 2019-01-27 DIAGNOSIS — D649 Anemia, unspecified: Secondary | ICD-10-CM

## 2019-01-27 LAB — CBC WITH DIFFERENTIAL/PLATELET
Basophils Absolute: 0 10*3/uL (ref 0.0–0.1)
Basophils Relative: 0.7 % (ref 0.0–3.0)
Eosinophils Absolute: 0.3 10*3/uL (ref 0.0–0.7)
Eosinophils Relative: 4 % (ref 0.0–5.0)
HCT: 32.9 % — ABNORMAL LOW (ref 39.0–52.0)
Hemoglobin: 11.1 g/dL — ABNORMAL LOW (ref 13.0–17.0)
Lymphocytes Relative: 14.6 % (ref 12.0–46.0)
Lymphs Abs: 0.9 10*3/uL (ref 0.7–4.0)
MCHC: 33.7 g/dL (ref 30.0–36.0)
MCV: 94.2 fl (ref 78.0–100.0)
Monocytes Absolute: 0.9 10*3/uL (ref 0.1–1.0)
Monocytes Relative: 13.5 % — ABNORMAL HIGH (ref 3.0–12.0)
Neutro Abs: 4.3 10*3/uL (ref 1.4–7.7)
Neutrophils Relative %: 67.2 % (ref 43.0–77.0)
Platelets: 157 10*3/uL (ref 150.0–400.0)
RBC: 3.49 Mil/uL — ABNORMAL LOW (ref 4.22–5.81)
RDW: 15 % (ref 11.5–15.5)
WBC: 6.4 10*3/uL (ref 4.0–10.5)

## 2019-01-27 NOTE — Telephone Encounter (Signed)
See note  Copied from [place] [phone]. Topic: General - Inquiry >> Jan 27, 2019  2:16 PM Alease Frame wrote: Reason for CRM: Patient wife called in patient is supposed to have a prescription a cream. she isnt sure of the name. please advise

## 2019-01-27 NOTE — Telephone Encounter (Signed)
Did you want to send in cream?

## 2019-01-27 NOTE — Telephone Encounter (Signed)
He was in the hallway today and I said hello-he pointed out an excoriated spot on his leg-daughter stated he wanted a cream.  I told him I would be willing to consider calling this in  they could tell me what the cream was.  He has some edema in both legs and some venous stasis skin changes but no obvious cellulitic infection.  Would be fine to try over-the-counter hydrocortisone 1% twice a day for 7 days and let me know if not improving.  He needs to avoid scratching the area.  If area gets worse he needs to see me for a visit.

## 2019-01-27 NOTE — Telephone Encounter (Signed)
Copied from [place] [phone]. Topic: General - Other >> Jan 24, 2019  4:11 PM Leward Quan A wrote: Reason for CRM: Patient wife called to say that he took a test at home and is waiting for the results from the doctor but she does not know the type of test that it was. Please advise Ph#  [phone]

## 2019-01-28 MED ORDER — TRIAMCINOLONE ACETONIDE 0.1 % EX CREA: 1.0000 "application " | TOPICAL_CREAM | Freq: Two times a day (BID) | CUTANEOUS | 0 refills | Status: DC

## 2019-01-28 NOTE — Telephone Encounter (Signed)
Kenalog 0.1%  Was given to patient in the past would like to have refilled for excoriated spot. If not will try the over the counter medications that you recommended.   NEXT APPOINTMENT DATE:@1 /12/2019   LAST REFILL:07/24/2018  QTY:240G

## 2019-01-29 ENCOUNTER — Telehealth: Payer: Self-pay | Admitting: *Deleted

## 2019-01-29 ENCOUNTER — Other Ambulatory Visit: Payer: Self-pay

## 2019-01-29 DIAGNOSIS — D649 Anemia, unspecified: Secondary | ICD-10-CM

## 2019-01-29 NOTE — Telephone Encounter (Signed)
Pt's daughter calling for the patient lab results. No order to give results at this time. Will route to  the provider.

## 2019-01-29 NOTE — Telephone Encounter (Signed)
Called and lm on pt vm tcb. 

## 2019-01-29 NOTE — Telephone Encounter (Signed)
See note

## 2019-01-29 NOTE — Telephone Encounter (Signed)
Called and lm on pt daughter vm tcb. 

## 2019-01-29 NOTE — Telephone Encounter (Addendum)
[name] (daughter) checking on the status of message below best call back # [phone].

## 2019-01-31 ENCOUNTER — Other Ambulatory Visit: Payer: Self-pay | Admitting: Family Medicine

## 2019-02-12 ENCOUNTER — Encounter: Payer: Medicare Other | Admitting: Family Medicine

## 2019-02-14 ENCOUNTER — Other Ambulatory Visit: Payer: Self-pay

## 2019-02-14 MED ORDER — ESCITALOPRAM OXALATE 20 MG PO TABS: ORAL_TABLET | ORAL | 1 refills | Status: DC

## 2019-02-19 DIAGNOSIS — D692 Other nonthrombocytopenic purpura: Secondary | ICD-10-CM | POA: Diagnosis not present

## 2019-02-19 DIAGNOSIS — D229 Melanocytic nevi, unspecified: Secondary | ICD-10-CM | POA: Diagnosis not present

## 2019-02-19 DIAGNOSIS — L570 Actinic keratosis: Secondary | ICD-10-CM | POA: Diagnosis not present

## 2019-02-23 ENCOUNTER — Other Ambulatory Visit: Payer: Self-pay | Admitting: Family Medicine

## 2019-02-24 ENCOUNTER — Telehealth: Payer: Self-pay

## 2019-02-24 NOTE — Telephone Encounter (Signed)
Do you know anything about this? 

## 2019-02-24 NOTE — Telephone Encounter (Signed)
Pt daughter has not heard back on this sample, is very upset as was told they were checking for cancer. States that mother whose in rest home with father had to get sample and she, daughter had dropped it off but no one was in the lab... Please fu if found or if another kit is required with daughter at [phone] Dolores Lory)

## 2019-02-24 NOTE — Telephone Encounter (Signed)
Copied from [place] [phone]. Topic: General - Other >> Feb 20, 2019  2:00 PM Alanda Slim E wrote: Reason for CRM: Pt called to check on stool sample that was dropped off for testing three weeks ago/ please advise

## 2019-02-24 NOTE — Telephone Encounter (Signed)
Contact patient's daughter to advise

## 2019-03-10 ENCOUNTER — Encounter: Payer: Medicare Other | Admitting: Family Medicine

## 2019-03-18 ENCOUNTER — Other Ambulatory Visit: Payer: Self-pay | Admitting: Family Medicine

## 2019-03-21 ENCOUNTER — Other Ambulatory Visit: Payer: Self-pay

## 2019-03-24 ENCOUNTER — Ambulatory Visit (INDEPENDENT_AMBULATORY_CARE_PROVIDER_SITE_OTHER): Payer: Medicare Other | Admitting: Family Medicine

## 2019-03-24 ENCOUNTER — Encounter: Payer: Self-pay | Admitting: Family Medicine

## 2019-03-24 VITALS — BP 164/60 | HR 48 | Temp 97.6°F | Ht 72.0 in | Wt 202.0 lb

## 2019-03-24 DIAGNOSIS — R413 Other amnesia: Secondary | ICD-10-CM

## 2019-03-24 DIAGNOSIS — N179 Acute kidney failure, unspecified: Secondary | ICD-10-CM

## 2019-03-24 DIAGNOSIS — I2510 Atherosclerotic heart disease of native coronary artery without angina pectoris: Secondary | ICD-10-CM | POA: Diagnosis not present

## 2019-03-24 DIAGNOSIS — K921 Melena: Secondary | ICD-10-CM

## 2019-03-24 DIAGNOSIS — I10 Essential (primary) hypertension: Secondary | ICD-10-CM

## 2019-03-24 DIAGNOSIS — I482 Chronic atrial fibrillation, unspecified: Secondary | ICD-10-CM

## 2019-03-24 DIAGNOSIS — I69351 Hemiplegia and hemiparesis following cerebral infarction affecting right dominant side: Secondary | ICD-10-CM

## 2019-03-24 DIAGNOSIS — R5383 Other fatigue: Secondary | ICD-10-CM

## 2019-03-24 DIAGNOSIS — D649 Anemia, unspecified: Secondary | ICD-10-CM | POA: Diagnosis not present

## 2019-03-24 LAB — CBC WITH DIFFERENTIAL/PLATELET
Basophils Absolute: 0 10*3/uL (ref 0.0–0.1)
Basophils Relative: 0.7 % (ref 0.0–3.0)
Eosinophils Absolute: 0.2 10*3/uL (ref 0.0–0.7)
Eosinophils Relative: 2.6 % (ref 0.0–5.0)
HCT: 35 % — ABNORMAL LOW (ref 39.0–52.0)
Hemoglobin: 11.5 g/dL — ABNORMAL LOW (ref 13.0–17.0)
Lymphocytes Relative: 18.7 % (ref 12.0–46.0)
Lymphs Abs: 1.2 10*3/uL (ref 0.7–4.0)
MCHC: 32.8 g/dL (ref 30.0–36.0)
MCV: 96.6 fl (ref 78.0–100.0)
Monocytes Absolute: 1 10*3/uL (ref 0.1–1.0)
Monocytes Relative: 15.5 % — ABNORMAL HIGH (ref 3.0–12.0)
Neutro Abs: 4 10*3/uL (ref 1.4–7.7)
Neutrophils Relative %: 62.5 % (ref 43.0–77.0)
Platelets: 141 10*3/uL — ABNORMAL LOW (ref 150.0–400.0)
RBC: 3.62 Mil/uL — ABNORMAL LOW (ref 4.22–5.81)
RDW: 15 % (ref 11.5–15.5)
WBC: 6.4 10*3/uL (ref 4.0–10.5)

## 2019-03-24 LAB — POC URINALSYSI DIPSTICK (AUTOMATED)
Bilirubin, UA: NEGATIVE
Blood, UA: NEGATIVE
Glucose, UA: NEGATIVE
Ketones, UA: NEGATIVE
Leukocytes, UA: NEGATIVE
Nitrite, UA: NEGATIVE
Protein, UA: POSITIVE — AB
Spec Grav, UA: 1.02 (ref 1.010–1.025)
Urobilinogen, UA: 0.2 E.U./dL
pH, UA: 6 (ref 5.0–8.0)

## 2019-03-24 LAB — TSH: TSH: 3.38 u[IU]/mL (ref 0.35–4.50)

## 2019-03-24 NOTE — Patient Instructions (Addendum)
Lets make sure no anemia- update bloodwork today  Thanks for bringing stool cards- if positive we may need to hold eliquis for at least a period- we can talk about possible GI referral.   Lets verify current meds are whats on the list from today- if they are I may increase amlodipine to 5 mg (though have to  Be careful with swelling)  Make sure to wear compression stockings  Please stop by lab before you go If you do not have mychart- we will call you about results within 5 business days of [place] receiving them.  If you have mychart- we will send your results within 3 business days of [place] receiving them.  If abnormal or we want to clarify a result, we will call or mychart you to make sure you receive the message.  If you have questions or concerns or don't hear within 5-7 days, please send [place] a message or call us.

## 2019-03-24 NOTE — Progress Notes (Signed)
Phone [phone] In person visit   Subjective:   [name] is a 86 y.o. year old very pleasant male patient who presents for/with See problem oriented charting Chief Complaint  Patient presents with  . Follow-up  . possible anemia   This visit occurred during the SARS-CoV-2 public health emergency.  Safety protocols were in place, including screening questions prior to the visit, additional usage of staff PPE, and extensive cleaning of exam room while observing appropriate contact time as indicated for disinfecting solutions.   Past Medical History-  Patient Active Problem List   Diagnosis Date Noted  . Memory loss 07/21/2014    Priority: High  . Chronic atrial fibrillation ([place]) 07/08/2011    Priority: High  . Hemiparesis affecting right side as late effect of cerebrovascular accident ([place]) 03/26/2007    Priority: High  . CAD (coronary artery disease) 08/23/2006    Priority: High  . Venous insufficiency 10/02/2016    Priority: Medium  . Right hydrocele 04/03/2016    Priority: Medium  . Thrombocytopenia ([place]) 11/22/2015    Priority: Medium  . CKD (chronic kidney disease), stage III 11/22/2015    Priority: Medium  . Bradycardia 07/09/2011    Priority: Medium  . Major depression in remission ([place]) 07/26/2007    Priority: Medium  . 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    Priority: Medium  . Hyperlipidemia 08/23/2006    Priority: Medium  . Essential hypertension 08/23/2006    Priority: Medium  . Actinic keratosis 07/30/2009    Priority: Low  . GERD 08/23/2006    Priority: Low  . 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 Left wrist pain 11/01/2016    Medications- reviewed and updated Current Outpatient Medications  Medication Sig Dispense Refill  . amLODipine (NORVASC) 2.5 MG tablet TAKE 1 TABLET(2.5 MG) BY MOUTH DAILY 90 tablet 1  . apixaban (ELIQUIS) 2.5 MG TABS  tablet Take 1 tablet (2.5 mg total) by mouth 2 (two) times daily. 60 tablet 6  . escitalopram (LEXAPRO) 20 MG tablet One tab daily 90 tablet 1  . Lansoprazole (PREVACID PO) Take 15 mg by mouth daily.    . memantine (NAMENDA) 10 MG tablet TAKE 1 TABLET(10 MG) BY MOUTH DAILY 90 tablet 0  . Multiple Vitamin (MULTIVITAMIN) tablet Take 1 tablet by mouth daily.      . rosuvastatin (CRESTOR) 20 MG tablet TAKE 1 TABLET BY MOUTH EVERY DAY 90 tablet 2  . tamsulosin (FLOMAX) 0.4 MG CAPS capsule TAKE ONE CAPSULE BY MOUTH AT BEDTIME 90 capsule 3  . triamterene-hydrochlorothiazide (MAXZIDE-25) 37.5-25 MG tablet Take 1 tablet by mouth every morning. 90 tablet 1   No current facility-administered medications for this visit.     Objective:  BP (!) 164/60   Pulse (!) 48   Temp 97.6 F (36.4 C)   Ht 6' (1.829 m)   Wt 202 lb (91.6 kg)   SpO2 98%   BMI 27.40 kg/m  Gen: NAD, resting comfortably CV: Bradycardic and irregular no murmurs rubs or gallops Lungs: CTAB no crackles, wheeze, rhonchi Abdomen: soft/nontender/nondistended/normal bowel sounds. No rebound or guarding.  Ext: no edema Skin: warm, dry Neuro: Unintelligible speech and expressions.  Attempted neurological exam but patient not able to follow most of the commands-no obvious gross deficits other than baseline speech issues and 4 out of 5 strength on the right compared to 5 out of 5  on the left with known hemiparesis      Assessment and Plan   #Fatigue/concern for anemia/expressive aphasia #Atrial fibrillation-stable on Eliquis S: Patient was noted to be anemic in November with hemoglobin 11.1.  We had tried to collect stool cards/sample for Hemoccult but there has been difficulty completing the sample due to patient's expressive aphasia and wife being hard of hearing and daughter not being allowed into the facility that he lives in.  Since that time, daughter has become concerned that patient may be suffering with some fatigue/low  energy.  He seems to be less active overall.  Occasionally he will point to his left chest even at rest as he did today-when specifically asked about chest pain he is unable to answer and also unable to answer clearly about palpitations.  Patient also had an abnormal behavior where he drove into the yard which is very atypical for him as generally has no issues driving -previously had no trouble driving.  His license is expiring the summer and family is not planning to renew it.  Difficult to assess due to aphasia but memory issues may be slightly worse -Even though there may be some recent memory worsening apparently patient is still able to help collect forms for his taxes and take them to a CPA A/P: For fatigue we will get CBC, CMP, TSH.   -We will await stool cards -If anemia worsens in stool cards positive-may have a hard decision between short term hold on Eliquis and recheck versus GI referral particularly at his age but also would have to balance that with increased stroke risk off Eliquis -Due to abnormal behavior driving in the yard which is very atypical and possible worsening of memory-I offered to update MRI of the brain due to possibility of new stroke-family declines at this time.  -Patient does have history of stroke which was thought to be related to atrial fibrillation and he is compliant with his Eliquis.  He has stable known hemiparesis.  Patient is bradycardic even without rate control but is appropriately anticoagulated-we will continue current medication-it is possible bradycardia could cause some fatigue -Patient has known CAD with history of CABG in the past with last visit with cardiology in November 2020 though primarily follows for atrial fibrillation.  He is compliant with statin as far as we know -Given history of recent issues with driving-I did encourage letting license lapse -Did not discuss with family but did briefly consider palliative care consult-with patient's  aphasia and wife not being present sometimes it is hard for daughter to navigate what is best for patient and unfortunately much of this falls on her due to aphasia issues  #hypertension S: Last visit it did not seem that patient was taking Maxide 37.5-25 mg.  Today it also appears unclear what blood pressure medications he is taking-daughter was able to go him previously and help set up amlodipine and Maxide together but she was not able to go into his facility recently BP Readings from Last 3 Encounters:  03/24/19 (!) 164/60  01/01/19 (!) 160/80  12/26/18 (!) 178/78  A/P: Poor control today-daughter is going to try to go into facility with patient if she is allowed in light of COVID-19 restrictions.  Would likely continue Maxide at present dose.  Likely increase amlodipine to 5 mg from 2.5 mg if he is in fact taking it.  Determine follow-up based off of #s and what medications he is taking    Recommended follow up:  Future Appointments  Date Time Provider [place]  07/01/2019 11:30 AM Sherran Needs, NP [facility] None  12/29/2019  4:00 PM [name], MD [facility]    Lab/Order associations:   ICD-10-CM   1. Blood in stool  K92.1 Fecal occult blood, imunochemical    Fecal occult blood, imunochemical  2. Fatigue, unspecified type  R53.83 CBC with Differential/Platelet    Comprehensive metabolic panel    TSH  3. Memory loss  R41.3 POCT Urinalysis Dipstick (Automated)    Urine Culture  4. Chronic atrial fibrillation (HCC)  I48.20   5. Hemiparesis affecting right side as late effect of cerebrovascular accident ([place])  I69.351   6. Coronary artery disease involving native coronary artery of native heart without angina pectoris  I25.10   7. Essential hypertension  I10    Return precautions advised.  [name], MD

## 2019-03-25 LAB — URINE CULTURE
MICRO NUMBER:: 10077906
SPECIMEN QUALITY:: ADEQUATE

## 2019-03-25 LAB — COMPREHENSIVE METABOLIC PANEL
ALT: 30 U/L (ref 0–53)
AST: 22 U/L (ref 0–37)
Albumin: 4.3 g/dL (ref 3.5–5.2)
Alkaline Phosphatase: 110 U/L (ref 39–117)
BUN: 49 mg/dL — ABNORMAL HIGH (ref 6–23)
CO2: 27 mEq/L (ref 19–32)
Calcium: 9.3 mg/dL (ref 8.4–10.5)
Chloride: 102 mEq/L (ref 96–112)
Creatinine, Ser: 2.51 mg/dL — ABNORMAL HIGH (ref 0.40–1.50)
GFR: 24.36 mL/min — ABNORMAL LOW (ref >60.00)
Glucose, Bld: 129 mg/dL — ABNORMAL HIGH (ref 70–99)
Potassium: 5 mEq/L (ref 3.5–5.1)
Sodium: 140 mEq/L (ref 135–145)
Total Bilirubin: 0.4 mg/dL (ref 0.2–1.2)
Total Protein: 7 g/dL (ref 6.0–8.3)

## 2019-03-25 LAB — FECAL OCCULT BLOOD, IMMUNOCHEMICAL: Fecal Occult Bld: NEGATIVE

## 2019-03-25 NOTE — Addendum Note (Signed)
Addended by: [name] on: 03/25/2019 10:55 PM   Modules accepted: Orders

## 2019-03-25 NOTE — Addendum Note (Signed)
Addended by: Francis Dowse T on: 03/25/2019 08:34 AM   Modules accepted: Orders

## 2019-03-25 NOTE — Addendum Note (Signed)
Addended by: Francis Dowse T on: 03/25/2019 08:33 AM   Modules accepted: Orders

## 2019-03-26 ENCOUNTER — Ambulatory Visit (HOSPITAL_COMMUNITY)
Admission: RE | Admit: 2019-03-26 | Discharge: 2019-03-26 | Disposition: A | Discharge status: Home / Self Care | Payer: Medicare Other | Source: Ambulatory Visit | Attending: Family Medicine | Admitting: Family Medicine

## 2019-03-26 ENCOUNTER — Other Ambulatory Visit: Payer: Self-pay | Admitting: Family Medicine

## 2019-03-26 ENCOUNTER — Other Ambulatory Visit: Payer: Self-pay

## 2019-03-26 DIAGNOSIS — N179 Acute kidney failure, unspecified: Secondary | ICD-10-CM | POA: Diagnosis not present

## 2019-03-26 NOTE — Progress Notes (Signed)
No UTI was found on lab-make sure to discuss other labs with patient and help him set up follow-up labs/orders from that

## 2019-03-27 ENCOUNTER — Telehealth: Payer: Self-pay | Admitting: Family Medicine

## 2019-03-27 ENCOUNTER — Other Ambulatory Visit: Payer: Self-pay

## 2019-03-27 DIAGNOSIS — N179 Acute kidney failure, unspecified: Secondary | ICD-10-CM

## 2019-03-27 NOTE — Progress Notes (Signed)
No findings on ultrasound to explain recent worsening of kidney function.  He does have some signs of chronic renal kidney disease where the kidney somewhat shrink down.  He also has signs of multiple cysts in both kidneys-they compared this to prior imaging and stated they were stable-none appear to be cancer.  I still think the follow-up urine and blood work is important as well as follow-up with [place] can take some time to get into the nephrologist/kidney doctor

## 2019-03-27 NOTE — Telephone Encounter (Signed)
I reviewed labs with pt yesterday and she did not have questions at that time, do you have a better way of explaining to her? I don't want to add to the confusion.

## 2019-03-27 NOTE — Telephone Encounter (Signed)
Spoke with daughter- they will come by for lab tomorrow

## 2019-03-27 NOTE — Telephone Encounter (Signed)
Pt daughter called asking if a nurse could give her a call. She is confused as to why Dr. Yong Channel wants her father to come back in to have labs done. Pt daughter also asked if the results of his tests could be explained to her. Please advise.

## 2019-03-28 ENCOUNTER — Other Ambulatory Visit: Payer: Self-pay

## 2019-03-28 ENCOUNTER — Other Ambulatory Visit (INDEPENDENT_AMBULATORY_CARE_PROVIDER_SITE_OTHER): Payer: Medicare Other

## 2019-03-28 DIAGNOSIS — N179 Acute kidney failure, unspecified: Secondary | ICD-10-CM | POA: Diagnosis not present

## 2019-03-28 LAB — URINALYSIS, MICROSCOPIC ONLY

## 2019-03-28 LAB — BASIC METABOLIC PANEL
BUN: 53 mg/dL — ABNORMAL HIGH (ref 6–23)
CO2: 29 mEq/L (ref 19–32)
Calcium: 8.9 mg/dL (ref 8.4–10.5)
Chloride: 102 mEq/L (ref 96–112)
Creatinine, Ser: 1.98 mg/dL — ABNORMAL HIGH (ref 0.40–1.50)
GFR: 32.03 mL/min — ABNORMAL LOW (ref >60.00)
Glucose, Bld: 121 mg/dL — ABNORMAL HIGH (ref 70–99)
Potassium: 3.9 mEq/L (ref 3.5–5.1)
Sodium: 139 mEq/L (ref 135–145)

## 2019-03-28 LAB — MICROALBUMIN / CREATININE URINE RATIO
Creatinine,U: 97.7 mg/dL
Microalb Creat Ratio: 118.5 mg/g — ABNORMAL HIGH (ref 0.0–30.0)
Microalb, Ur: 115.7 mg/dL — ABNORMAL HIGH (ref 0.0–1.9)

## 2019-03-28 NOTE — Addendum Note (Signed)
Addended by: Francis Dowse T on: 03/28/2019 01:44 PM   Modules accepted: Orders

## 2019-03-31 LAB — PROTEIN ELECTROPHORESIS, URINE REFLEX
Albumin ELP, Urine: 73.6 %
Alpha-1-Globulin, U: 1.3 %
Alpha-2-Globulin, U: 5.3 %
Beta Globulin, U: 8.6 %
Gamma Globulin, U: 11.3 %
Protein, Ur: 164.2 mg/dL

## 2019-03-31 LAB — PROTEIN ELECTROPHORESIS, SERUM
Albumin ELP: 4 g/dL (ref 3.8–4.8)
Alpha 1: 0.4 g/dL — ABNORMAL HIGH (ref 0.2–0.3)
Alpha 2: 0.8 g/dL (ref 0.5–0.9)
Beta 2: 0.4 g/dL (ref 0.2–0.5)
Beta Globulin: 0.4 g/dL (ref 0.4–0.6)
Gamma Globulin: 1.1 g/dL (ref 0.8–1.7)
Total Protein: 7 g/dL (ref 6.1–8.1)

## 2019-04-01 ENCOUNTER — Telehealth: Payer: Self-pay | Admitting: Family Medicine

## 2019-04-01 NOTE — Telephone Encounter (Signed)
Pt daughter called requesting results on recent lab work. Please advise.

## 2019-04-02 NOTE — Telephone Encounter (Signed)
Pt daughter following up on test results best contact number [phone].Marland KitchenMarland Kitchen

## 2019-04-02 NOTE — Telephone Encounter (Signed)
Called and reviewed labs with pt daughter. Pt daughter is still concerned about pt anemia and BP. F/u appointment scheduled per pt request.

## 2019-04-09 DIAGNOSIS — D631 Anemia in chronic kidney disease: Secondary | ICD-10-CM | POA: Diagnosis not present

## 2019-04-09 DIAGNOSIS — N1832 Chronic kidney disease, stage 3b: Secondary | ICD-10-CM | POA: Diagnosis not present

## 2019-04-09 DIAGNOSIS — I129 Hypertensive chronic kidney disease with stage 1 through stage 4 chronic kidney disease, or unspecified chronic kidney disease: Secondary | ICD-10-CM | POA: Diagnosis not present

## 2019-04-09 DIAGNOSIS — N281 Cyst of kidney, acquired: Secondary | ICD-10-CM | POA: Diagnosis not present

## 2019-04-10 ENCOUNTER — Other Ambulatory Visit: Payer: Self-pay

## 2019-04-10 ENCOUNTER — Encounter: Payer: Self-pay | Admitting: Family Medicine

## 2019-04-10 ENCOUNTER — Ambulatory Visit (INDEPENDENT_AMBULATORY_CARE_PROVIDER_SITE_OTHER): Payer: Medicare Other | Admitting: Family Medicine

## 2019-04-10 VITALS — BP 148/68 | HR 80 | Temp 97.2°F | Ht 72.0 in | Wt 204.0 lb

## 2019-04-10 DIAGNOSIS — L03115 Cellulitis of right lower limb: Secondary | ICD-10-CM | POA: Diagnosis not present

## 2019-04-10 DIAGNOSIS — E785 Hyperlipidemia, unspecified: Secondary | ICD-10-CM

## 2019-04-10 DIAGNOSIS — N1832 Chronic kidney disease, stage 3b: Secondary | ICD-10-CM | POA: Diagnosis not present

## 2019-04-10 MED ORDER — CEPHALEXIN 500 MG PO CAPS: 500.0000 mg | ORAL_CAPSULE | Freq: Three times a day (TID) | ORAL | 0 refills | Status: DC

## 2019-04-10 NOTE — Progress Notes (Signed)
Phone [phone] In person visit   Subjective:   [name] is a 86 y.o. year old very pleasant male patient who presents for/with See problem oriented charting Chief Complaint  Patient presents with  . Follow-up  . swollen leg   This visit occurred during the SARS-CoV-2 public health emergency.  Safety protocols were in place, including screening questions prior to the visit, additional usage of staff PPE, and extensive cleaning of exam room while observing appropriate contact time as indicated for disinfecting solutions.   Past Medical History-  Patient Active Problem List   Diagnosis Date Noted  . Memory loss 07/21/2014    Priority: High  . Chronic atrial fibrillation ([place]) 07/08/2011    Priority: High  . Hemiparesis affecting right side as late effect of cerebrovascular accident ([place]) 03/26/2007    Priority: High  . CAD (coronary artery disease) 08/23/2006    Priority: High  . Venous insufficiency 10/02/2016    Priority: Medium  . Right hydrocele 04/03/2016    Priority: Medium  . Thrombocytopenia ([place]) 11/22/2015    Priority: Medium  . CKD (chronic kidney disease), stage III 11/22/2015    Priority: Medium  . Bradycardia 07/09/2011    Priority: Medium  . Major depression in remission ([place]) 07/26/2007    Priority: Medium  . 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    Priority: Medium  . Hyperlipidemia 08/23/2006    Priority: Medium  . Essential hypertension 08/23/2006    Priority: Medium  . Actinic keratosis 07/30/2009    Priority: Low  . GERD 08/23/2006    Priority: Low  . 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 Left wrist pain 11/01/2016    Medications- reviewed and updated Current Outpatient Medications  Medication Sig Dispense Refill  . amLODipine (NORVASC) 2.5 MG tablet TAKE 1 TABLET(2.5 MG) BY MOUTH DAILY 90 tablet 1  . apixaban (ELIQUIS) 2.5 MG TABS  tablet Take 1 tablet (2.5 mg total) by mouth 2 (two) times daily. 60 tablet 6  . escitalopram (LEXAPRO) 20 MG tablet One tab daily 90 tablet 1  . furosemide (LASIX) 40 MG tablet Take 40 mg by mouth. Has not started yet    . Lansoprazole (PREVACID PO) Take 15 mg by mouth daily.    . memantine (NAMENDA) 10 MG tablet TAKE 1 TABLET(10 MG) BY MOUTH DAILY 90 tablet 0  . Multiple Vitamin (MULTIVITAMIN) tablet Take 1 tablet by mouth daily.      . rosuvastatin (CRESTOR) 20 MG tablet TAKE 1 TABLET BY MOUTH EVERY DAY 90 tablet 2  . tamsulosin (FLOMAX) 0.4 MG CAPS capsule TAKE ONE CAPSULE BY MOUTH AT BEDTIME 90 capsule 3  . cephALEXin (KEFLEX) 500 MG capsule Take 1 capsule (500 mg total) by mouth every 8 (eight) hours. 21 capsule 0  . triamterene-hydrochlorothiazide (MAXZIDE-25) 37.5-25 MG tablet Take 1 tablet by mouth every morning. (Patient not taking: Reported on 04/10/2019) 90 tablet 1   No current facility-administered medications for this visit.     Objective:  BP (!) 148/68   Pulse 80   Temp (!) 97.2 F (36.2 C) (Temporal)   Ht 6' (1.829 m)   Wt 204 lb (92.5 kg)   SpO2 93%   BMI 27.67 kg/m  Gen: NAD, resting comfortably CV: RRR no murmurs rubs or gallops Lungs: CTAB no crackles, wheeze, rhonchi Abdomen: soft/nontender/nondistended/normal bowel sounds. No rebound or guarding.  Ext: 1+ edema  on left lower extremity with faint erythema.  On right lower extremity 2+ edema noted.  No calf pain.  Warm to touch but not particularly tender.  More intense erythema the left leg and extends up to nearly the knee.  Pictured below.  Abrasion on right lower shin noted      Assessment and Plan  Swollen/red leg S:Patient has sore on shin of right leg-patient and family are unable to determine when he obtained this. Area has had increased swelling and redness with redness and warmth now approaching the knee. It is warm to touch.  No obvious drainage from wound.  Patient unable to describe how the injury  happened.  No fever or chills reported.  Has been slightly sleepy A/P: 86 year old male with abrasion on right lower leg which appears to have led to a cellulitis in right lower extremity.  Patient has CKD stage III with GFR close to 30 but has had dips down into the 20s-opted to use Keflex 500 mg every 8 hours to balance risks even if GFR is under 30.  #CKD stage III S: Patient saw Dr. Carolin Sicks yesterday.  Dr. Carolin Sicks discovered patient has been using Aleve or Advil at home-patient and I have discussed this previously and advised him to avoid these but patient's daughter reports they have been unable to stop him from taking these-she has advised him not to take and his wife also has advised him not to take.  Per Dr. 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  Due to significant lower extremity edema is suggested we stop Maxide and start Lasix 40 mg.  Patient may ultimately require higher dose per his note to me.  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  Reinforced avoiding NSAIDs.  #hypertension S: compliant with Maxide 37.5-25 mg.  Patient is compliant with amlodipine 2.5 mg.  As above plan has been to transition from [facility] to Lasix per nephrology.  Family has not had a chance to pick up the new medication for patient yet but they will do so today.  Family reports blood pressure was well controlled at nephrology yesterday BP Readings from Last 3 Encounters:  04/10/19 (!) 148/68  03/24/19 (!) 164/60  01/01/19 (!) 160/80  A/P: Poor control considering CKD stage III today-hopeful with transition to Lasix from [facility] we will see some improvement particularly if patient is able to diurese some.  I have asked him to follow-up with me next week to recheck the cellulitis-10 also reassess edema at that time   Recommended follow up: Advised follow-up visit next week-they  will call to cancel if leg drastically improves-otherwise has visit in March for blood pressure recheck Future Appointments  Date Time Provider [place]  04/15/2019  1:00 PM [name], MD [facility]  05/01/2019  1:40 PM [name], MD [facility]  07/01/2019 11:30 AM Sherran Needs, NP [facility] None  12/29/2019  4:00 PM [name], MD [facility]    Lab/Order associations:   ICD-10-CM   1. Cellulitis of right lower extremity  L03.115   2. Stage 3b chronic kidney disease  N18.32   3. Hyperlipidemia, unspecified hyperlipidemia type  E78.5     Meds ordered this encounter  Medications  . cephALEXin (KEFLEX) 500 MG capsule    Sig: Take 1 capsule (500 mg total) by mouth every 8 (eight) hours.    Dispense:  21 capsule    Refill:  0   Return precautions advised.  [name], MD

## 2019-04-10 NOTE — Patient Instructions (Addendum)
Health Maintenance Due  Topic Date Due  . TETANUS/TDAP - team please do Td immunization today under abrasion right shin 02/28/2015   Looks like you have an infection on right lower leg of the skin. Start antibiotic cephelaxin and take every 8 hours for 7 days.   Team please either schedule him or have him schedule a visit with me early next week (can use same day slot) to recheck and make sure going in right direction

## 2019-04-14 ENCOUNTER — Other Ambulatory Visit: Payer: Self-pay

## 2019-04-15 ENCOUNTER — Encounter: Payer: Self-pay | Admitting: Family Medicine

## 2019-04-15 ENCOUNTER — Encounter (INDEPENDENT_AMBULATORY_CARE_PROVIDER_SITE_OTHER): Payer: Medicare Other

## 2019-04-15 ENCOUNTER — Ambulatory Visit (INDEPENDENT_AMBULATORY_CARE_PROVIDER_SITE_OTHER): Payer: Medicare Other | Admitting: Family Medicine

## 2019-04-15 VITALS — BP 140/68 | HR 60 | Temp 98.7°F | Ht 72.0 in | Wt 200.0 lb

## 2019-04-15 DIAGNOSIS — F325 Major depressive disorder, single episode, in full remission: Secondary | ICD-10-CM | POA: Diagnosis not present

## 2019-04-15 DIAGNOSIS — I10 Essential (primary) hypertension: Secondary | ICD-10-CM | POA: Diagnosis not present

## 2019-04-15 DIAGNOSIS — D692 Other nonthrombocytopenic purpura: Secondary | ICD-10-CM | POA: Diagnosis not present

## 2019-04-15 DIAGNOSIS — Z0000 Encounter for general adult medical examination without abnormal findings: Secondary | ICD-10-CM

## 2019-04-15 DIAGNOSIS — N1832 Chronic kidney disease, stage 3b: Secondary | ICD-10-CM | POA: Diagnosis not present

## 2019-04-15 DIAGNOSIS — L03115 Cellulitis of right lower limb: Secondary | ICD-10-CM

## 2019-04-15 MED ORDER — CEPHALEXIN 500 MG PO CAPS: 500.0000 mg | ORAL_CAPSULE | Freq: Three times a day (TID) | ORAL | 0 refills | Status: DC

## 2019-04-15 MED ORDER — TRIAMCINOLONE ACETONIDE 0.1 % EX CREA: 1.0000 "application " | TOPICAL_CREAM | Freq: Two times a day (BID) | CUTANEOUS | 0 refills | Status: DC

## 2019-04-15 NOTE — Progress Notes (Signed)
Phone [phone] In person visit   Subjective:   [name] is a 86 y.o. year old very pleasant male patient who presents for/with See problem oriented charting Chief Complaint  Patient presents with  . Recurrent Skin Infections    right lower leg     This visit occurred during the SARS-CoV-2 public health emergency.  Safety protocols were in place, including screening questions prior to the visit, additional usage of staff PPE, and extensive cleaning of exam room while observing appropriate contact time as indicated for disinfecting solutions.   Past Medical History-  Patient Active Problem List   Diagnosis Date Noted  . Memory loss 07/21/2014    Priority: High  . Chronic atrial fibrillation ([place]) 07/08/2011    Priority: High  . Hemiparesis affecting right side as late effect of cerebrovascular accident ([place]) 03/26/2007    Priority: High  . CAD (coronary artery disease) 08/23/2006    Priority: High  . Venous insufficiency 10/02/2016    Priority: Medium  . Right hydrocele 04/03/2016    Priority: Medium  . Thrombocytopenia ([place]) 11/22/2015    Priority: Medium  . CKD (chronic kidney disease), stage III 11/22/2015    Priority: Medium  . Bradycardia 07/09/2011    Priority: Medium  . Major depression in remission ([place]) 07/26/2007    Priority: Medium  . 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    Priority: Medium  . Hyperlipidemia 08/23/2006    Priority: Medium  . Essential hypertension 08/23/2006    Priority: Medium  . Actinic keratosis 07/30/2009    Priority: Low  . GERD 08/23/2006    Priority: Low  . 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 Left wrist pain 11/01/2016  . Sensorineural hearing loss (SN[facility]), bilateral 07/06/2015    Medications- reviewed and updated Current Outpatient Medications  Medication Sig Dispense Refill  . amLODipine (NORVASC) 2.5 MG tablet TAKE 1  TABLET(2.5 MG) BY MOUTH DAILY 90 tablet 1  . apixaban (ELIQUIS) 2.5 MG TABS tablet Take 1 tablet (2.5 mg total) by mouth 2 (two) times daily. 60 tablet 6  . cephALEXin (KEFLEX) 500 MG capsule Take 1 capsule (500 mg total) by mouth every 8 (eight) hours. 9 capsule 0  . escitalopram (LEXAPRO) 20 MG tablet One tab daily 90 tablet 1  . furosemide (LASIX) 40 MG tablet Take 40 mg by mouth. Per [facility]    . Lansoprazole (PREVACID PO) Take 15 mg by mouth daily.    . memantine (NAMENDA) 10 MG tablet TAKE 1 TABLET(10 MG) BY MOUTH DAILY 90 tablet 0  . Multiple Vitamin (MULTIVITAMIN) tablet Take 1 tablet by mouth daily.      . rosuvastatin (CRESTOR) 20 MG tablet TAKE 1 TABLET BY MOUTH EVERY DAY 90 tablet 2  . tamsulosin (FLOMAX) 0.4 MG CAPS capsule TAKE ONE CAPSULE BY MOUTH AT BEDTIME 90 capsule 3  . triamcinolone cream (KENALOG) 0.1 % Apply 1 application topically 2 (two) times daily. For 7-10 days maximum 160 g 0   No current facility-administered medications for this visit.     Objective:  BP 140/68   Pulse 60   Temp 98.7 F (37.1 C) (Temporal)   Ht 6' (1.829 m)   Wt 200 lb (90.7 kg)   SpO2 94%   BMI 27.12 kg/m  Gen: NAD, resting comfortably CV: Irregularly irregular no murmurs rubs or gallops Lungs: CTAB no crackles, wheeze, rhonchi Ext:  Trace to 1+ edema on the left.  1+ edema on the right Skin: warm, dry, erythema on right lower extremity is significantly improved in intensity.  Warmth has diminished.  Swelling has diminished.  Some dry skin noted-excoriations noted on right lower extremity        Assessment and Plan  # Cellulitis  S: Family and patient have noted improvement in level of redness on right lower extremity-seems to be receding some.  I also feel like the amount of swelling in the leg has substantially improved.  Slightly less warm to touch. A/P: Cellulitis appears to be improving on Keflex 3 times a day.  We are going into another potential winter weather patch  and I decided to be on the safe side in regards to cellulitis to extend course another 3 days for total of 10 days of antibiotics.  Able follow-up if new or worsening symptoms-otherwise we opted for follow-up in 3 to 4 months -I recommended Td nurse visit 2 to 3 weeks after second Covid vaccination-apparently he received his first within the last few weeks and I did 1 of prioritize Covid vaccination over tetanus shot -Senile purpura noted with easy bruising of her lower extremities with even light excoriation-we will try to avoid having him scratch by giving him some triamcinolone  #hypertension/CKD stage III S: compliant with amlodipine 2.5 mg.  [facility] wanted to stop his Maxide and have him take Lasix 40 mg daily.  Blood pressure seems to have improved on this regimen BP Readings from Last 3 Encounters:  04/15/19 140/68  04/15/19 140/68  04/10/19 (!) 148/68  A/P: Significant improvement in blood pressure today.  Continue current medications for now weight is also down 4 pounds from last visit.  Technically with CKD stage III want blood pressure under 140-I think with continued diuresis he may get there-he has follow-up in another 6 weeks or so with [facility] and we decided to see each other in 3 to 4 months from today-we will update blood work at that time potentially-CBC, CMP at least.  In reflection prior fatigue may have been related to chronic kidney disease stage III and fluid retention-patient and family report energy levels have improved some.  Still some difficulty with history taking do speech issues post stroke   # Depression S: Patient reports to the best of his ability that depression has improved.  Daughter states she noted him singing in the car today which to her is a good sign A/P: Appears to be in full remission-we will continue escitalopram 20 mg.  Could consider reduction to 10 mg at a future visit and see how he does if continues to do well on current  dose  Recommended follow up: 3 to 73-month follow-up with TD nurse visit 2 to 3 weeks after second Covid vaccination Future Appointments  Date Time Provider [place]  07/01/2019 11:30 AM Sherran Needs, NP [facility] None  08/11/2019  1:40 PM [name], MD [facility]  12/29/2019  4:00 PM [name], MD [facility]    Lab/Order associations:   ICD-10-CM   1. Essential hypertension  I10   2. Stage 3b chronic kidney disease  N18.32   3. Major depression in remission ([place])  F32.5   4. Senile purpura ([facility]) Chronic D69.2   5. Cellulitis of right lower extremity  L03.115     Meds ordered this encounter  Medications  . triamcinolone cream (KENALOG) 0.1 %    Sig: Apply 1 application  topically 2 (two) times daily. For 7-10 days maximum    Dispense:  160 g    Refill:  0  . cephALEXin (KEFLEX) 500 MG capsule    Sig: Take 1 capsule (500 mg total) by mouth every 8 (eight) hours.    Dispense:  9 capsule    Refill:  0   Return precautions advised.  [name], MD

## 2019-04-15 NOTE — Progress Notes (Signed)
Errorneous Encounter

## 2019-04-15 NOTE — Patient Instructions (Addendum)
Health Maintenance Due  Topic Date Due  . TETANUS/TDAP  - due for this- come back for nurse visit under laceration on right leg 2-3 weeks after 2nd covid shot.  02/28/2015   Leg looks better but I gave you an additional 3 days of medicine in case we need to extend course- I would if its still as red when you run out of first bottle  Try to avoid scratching- I gave you some anti itch cream  Blood pressure is looking better on lasix plus weight down 4 lbs which is a good sign.     Recommended follow up: You can cancel visit on march 4th before you leave - lets push visit out 3-4 months from today

## 2019-04-15 NOTE — Assessment & Plan Note (Signed)
S: compliant with amlodipine 2.5 mg.  [facility] wanted to stop his Maxide and have him take Lasix 40 mg daily.  Blood pressure seems to have improved on this regimen BP Readings from Last 3 Encounters:  04/15/19 140/68  04/10/19 (!) 148/68  03/24/19 (!) 164/60  A/P: Significant improvement in blood pressure today.  Continue current medications for now weight is also down 4 pounds from last visit.  Technically with CKD stage III want blood pressure under 140-I think with continued diuresis he may get there-he has follow-up in another 6 weeks or so with [facility] and we decided to see each other in 3 to 4 months from today-we will update blood work at that time potentially-CBC, CMP at least.  In reflection prior fatigue may have been related to chronic kidney disease stage III and fluid retention-patient and family report energy levels have improved some.  Still some difficulty with history taking do speech issues post stroke

## 2019-04-15 NOTE — Patient Instructions (Addendum)
[name] , Thank you for taking time to come for your Medicare Wellness Visit. I appreciate your ongoing commitment to your health goals. Please review the following plan we discussed and let me know if I can assist you in the future.   Screening recommendations/referrals: Colorectal Screening: No longer indicated   Vision and Dental Exams: Recommended annual ophthalmology exams for early detection of glaucoma and other disorders of the eye Recommended annual dental exams for proper oral hygiene  Vaccinations: Influenza vaccine: completed 11/08/18 Pneumococcal vaccine: up to date; last 07/21/14  Tdap vaccine: recommended for future visits  Shingles vaccine: Please call your insurance company to determine your out of pocket expense for the Shingrix vaccine. You may receive this vaccine at your local pharmacy. (see handout)   Advanced directives: Please bring a copy of your POA (Power of Attorney) and/or Living Will to your next appointment.  Goals: Recommend to drink at least 6-8 8oz glasses of water per day and consume a balanced diet rich in fresh fruits and vegetables.   Next appointment: Please schedule your Annual Wellness Visit with your Nurse Health Advisor in one year.  Preventive Care 84 Years and Older, Male Preventive care refers to lifestyle choices and visits with your health care provider that can promote health and wellness. What does preventive care include?  A yearly physical exam. This is also called an annual well check.  Dental exams once or twice a year.  Routine eye exams. Ask your health care provider how often you should have your eyes checked.  Personal lifestyle choices, including:  Daily care of your teeth and gums.  Regular physical activity.  Eating a healthy diet.  Avoiding tobacco and drug use.  Limiting alcohol use.  Practicing safe sex.  Taking low doses of aspirin every day if recommended by your health care provider..  Taking vitamin and  mineral supplements as recommended by your health care provider. What happens during an annual well check? The services and screenings done by your health care provider during your annual well check will depend on your age, overall health, lifestyle risk factors, and family history of disease. Counseling  Your health care provider may ask you questions about your:  Alcohol use.  Tobacco use.  Drug use.  Emotional well-being.  Home and relationship well-being.  Sexual activity.  Eating habits.  History of falls.  Memory and ability to understand (cognition).  Work and work Statistician. Screening  You may have the following tests or measurements:  Height, weight, and BMI.  Blood pressure.  Lipid and cholesterol levels. These may be checked every 5 years, or more frequently if you are over 28 years old.  Skin check.  Lung cancer screening. You may have this screening every year starting at age 18 if you have a 30-pack-year history of smoking and currently smoke or have quit within the past 15 years.  Fecal occult blood test (FOBT) of the stool. You may have this test every year starting at age 65.  Flexible sigmoidoscopy or colonoscopy. You may have a sigmoidoscopy every 5 years or a colonoscopy every 10 years starting at age 42.  Prostate cancer screening. Recommendations will vary depending on your family history and other risks.  Hepatitis C blood test.  Hepatitis B blood test.  Sexually transmitted disease (STD) testing.  Diabetes screening. This is done by checking your blood sugar (glucose) after you have not eaten for a while (fasting). You may have this done every 1-3 years.  Abdominal  aortic aneurysm (AAA) screening. You may need this if you are a current or former smoker.  Osteoporosis. You may be screened starting at age 67 if you are at high risk. Talk with your health care provider about your test results, treatment options, and if necessary, the need  for more tests. Vaccines  Your health care provider may recommend certain vaccines, such as:  Influenza vaccine. This is recommended every year.  Tetanus, diphtheria, and acellular pertussis (Tdap, Td) vaccine. You may need a Td booster every 10 years.  Zoster vaccine. You may need this after age 78.  Pneumococcal 13-valent conjugate (PCV13) vaccine. One dose is recommended after age 68.  Pneumococcal polysaccharide (PPSV23) vaccine. One dose is recommended after age 48. Talk to your health care provider about which screenings and vaccines you need and how often you need them. This information is not intended to replace advice given to you by your health care provider. Make sure you discuss any questions you have with your health care provider. Document Released: 03/12/2015 Document Revised: 11/03/2015 Document Reviewed: 12/15/2014 Elsevier Interactive Patient Education  2017 [employer] Prevention in the Home Falls can cause injuries. They can happen to people of all ages. There are many things you can do to make your home safe and to help prevent falls. What can I do on the outside of my home?  Regularly fix the edges of walkways and driveways and fix any cracks.  Remove anything that might make you trip as you walk through a door, such as a raised step or threshold.  Trim any bushes or trees on the path to your home.  Use bright outdoor lighting.  Clear any walking paths of anything that might make someone trip, such as rocks or tools.  Regularly check to see if handrails are loose or broken. Make sure that both sides of any steps have handrails.  Any raised decks and porches should have guardrails on the edges.  Have any leaves, snow, or ice cleared regularly.  Use sand or salt on walking paths during winter.  Clean up any spills in your garage right away. This includes oil or grease spills. What can I do in the bathroom?  Use night lights.  Install grab bars  by the toilet and in the tub and shower. Do not use towel bars as grab bars.  Use non-skid mats or decals in the tub or shower.  If you need to sit down in the shower, use a plastic, non-slip stool.  Keep the floor dry. Clean up any water that spills on the floor as soon as it happens.  Remove soap buildup in the tub or shower regularly.  Attach bath mats securely with double-sided non-slip rug tape.  Do not have throw rugs and other things on the floor that can make you trip. What can I do in the bedroom?  Use night lights.  Make sure that you have a light by your bed that is easy to reach.  Do not use any sheets or blankets that are too big for your bed. They should not hang down onto the floor.  Have a firm chair that has side arms. You can use this for support while you get dressed.  Do not have throw rugs and other things on the floor that can make you trip. What can I do in the kitchen?  Clean up any spills right away.  Avoid walking on wet floors.  Keep items that you use a  lot in easy-to-reach places.  If you need to reach something above you, use a strong step stool that has a grab bar.  Keep electrical cords out of the way.  Do not use floor polish or wax that makes floors slippery. If you must use wax, use non-skid floor wax.  Do not have throw rugs and other things on the floor that can make you trip. What can I do with my stairs?  Do not leave any items on the stairs.  Make sure that there are handrails on both sides of the stairs and use them. Fix handrails that are broken or loose. Make sure that handrails are as long as the stairways.  Check any carpeting to make sure that it is firmly attached to the stairs. Fix any carpet that is loose or worn.  Avoid having throw rugs at the top or bottom of the stairs. If you do have throw rugs, attach them to the floor with carpet tape.  Make sure that you have a light switch at the top of the stairs and the  bottom of the stairs. If you do not have them, ask someone to add them for you. What else can I do to help prevent falls?  Wear shoes that:  Do not have high heels.  Have rubber bottoms.  Are comfortable and fit you well.  Are closed at the toe. Do not wear sandals.  If you use a stepladder:  Make sure that it is fully opened. Do not climb a closed stepladder.  Make sure that both sides of the stepladder are locked into place.  Ask someone to hold it for you, if possible.  Clearly mark and make sure that you can see:  Any grab bars or handrails.  First and last steps.  Where the edge of each step is.  Use tools that help you move around (mobility aids) if they are needed. These include:  Canes.  Walkers.  Scooters.  Crutches.  Turn on the lights when you go into a dark area. Replace any light bulbs as soon as they burn out.  Set up your furniture so you have a clear path. Avoid moving your furniture around.  If any of your floors are uneven, fix them.  If there are any pets around you, be aware of where they are.  Review your medicines with your doctor. Some medicines can make you feel dizzy. This can increase your chance of falling. Ask your doctor what other things that you can do to help prevent falls. This information is not intended to replace advice given to you by your health care provider. Make sure you discuss any questions you have with your health care provider. Document Released: 12/10/2008 Document Revised: 07/22/2015 Document Reviewed: 03/20/2014 Elsevier Interactive Patient Education  2017 [employer].

## 2019-05-01 ENCOUNTER — Ambulatory Visit: Payer: Self-pay | Admitting: Family Medicine

## 2019-06-02 ENCOUNTER — Other Ambulatory Visit: Payer: Self-pay

## 2019-06-02 MED ORDER — MEMANTINE HCL 10 MG PO TABS: ORAL_TABLET | ORAL | 0 refills | Status: DC

## 2019-06-11 DIAGNOSIS — N1832 Chronic kidney disease, stage 3b: Secondary | ICD-10-CM | POA: Diagnosis not present

## 2019-06-19 DIAGNOSIS — D631 Anemia in chronic kidney disease: Secondary | ICD-10-CM | POA: Diagnosis not present

## 2019-06-19 DIAGNOSIS — I129 Hypertensive chronic kidney disease with stage 1 through stage 4 chronic kidney disease, or unspecified chronic kidney disease: Secondary | ICD-10-CM | POA: Diagnosis not present

## 2019-06-19 DIAGNOSIS — N281 Cyst of kidney, acquired: Secondary | ICD-10-CM | POA: Diagnosis not present

## 2019-06-19 DIAGNOSIS — N184 Chronic kidney disease, stage 4 (severe): Secondary | ICD-10-CM | POA: Diagnosis not present

## 2019-07-01 ENCOUNTER — Other Ambulatory Visit: Payer: Self-pay

## 2019-07-01 ENCOUNTER — Encounter (HOSPITAL_COMMUNITY): Payer: Self-pay | Admitting: Nurse Practitioner

## 2019-07-01 ENCOUNTER — Ambulatory Visit (HOSPITAL_COMMUNITY)
Admission: RE | Admit: 2019-07-01 | Discharge: 2019-07-01 | Disposition: A | Discharge status: Home / Self Care | Payer: Medicare Other | Source: Ambulatory Visit | Attending: Nurse Practitioner | Admitting: Nurse Practitioner

## 2019-07-01 VITALS — BP 158/66 | HR 48 | Ht 72.0 in | Wt 197.8 lb

## 2019-07-01 DIAGNOSIS — Z87891 Personal history of nicotine dependence: Secondary | ICD-10-CM | POA: Diagnosis not present

## 2019-07-01 DIAGNOSIS — R001 Bradycardia, unspecified: Secondary | ICD-10-CM | POA: Diagnosis not present

## 2019-07-01 DIAGNOSIS — Z951 Presence of aortocoronary bypass graft: Secondary | ICD-10-CM | POA: Insufficient documentation

## 2019-07-01 DIAGNOSIS — Z85828 Personal history of other malignant neoplasm of skin: Secondary | ICD-10-CM | POA: Diagnosis not present

## 2019-07-01 DIAGNOSIS — K219 Gastro-esophageal reflux disease without esophagitis: Secondary | ICD-10-CM | POA: Diagnosis not present

## 2019-07-01 DIAGNOSIS — E785 Hyperlipidemia, unspecified: Secondary | ICD-10-CM | POA: Insufficient documentation

## 2019-07-01 DIAGNOSIS — I4811 Longstanding persistent atrial fibrillation: Secondary | ICD-10-CM | POA: Diagnosis not present

## 2019-07-01 DIAGNOSIS — D6869 Other thrombophilia: Secondary | ICD-10-CM

## 2019-07-01 DIAGNOSIS — I2510 Atherosclerotic heart disease of native coronary artery without angina pectoris: Secondary | ICD-10-CM | POA: Insufficient documentation

## 2019-07-01 DIAGNOSIS — I69320 Aphasia following cerebral infarction: Secondary | ICD-10-CM | POA: Insufficient documentation

## 2019-07-01 DIAGNOSIS — Z882 Allergy status to sulfonamides status: Secondary | ICD-10-CM | POA: Insufficient documentation

## 2019-07-01 DIAGNOSIS — Z881 Allergy status to other antibiotic agents status: Secondary | ICD-10-CM | POA: Insufficient documentation

## 2019-07-01 DIAGNOSIS — I10 Essential (primary) hypertension: Secondary | ICD-10-CM | POA: Diagnosis not present

## 2019-07-01 DIAGNOSIS — I4821 Permanent atrial fibrillation: Secondary | ICD-10-CM

## 2019-07-01 NOTE — Progress Notes (Signed)
[name], male   DOB: 1929-01-20, 86 y.o.   MRN: [id number]       Primary Care Physician: [name], MD Primary Electrophysiologist: Dr. Rachelle Hora is a 86 y.o. male with a h/o long standing persistent atrial fibrillation who presents for f/u in the [facility]. The patient is aphasic due to previous stroke and is here with his daughter. He is off chronotropic drugs due to bradycardia. He  has a v rate of 48  bpm today. Daughter does not think he has been as active lately, but he does drive and goes to the grocery store. Does not report any lightheadedness of falls.  Taking eliquis without bleeding issues with a chadsvasc of at least 6. Continues on eliquis  to 2.5 mg bid. He has been seeing a kidney MD. Some BP meds have been adjusted by PCP lately.     He does live in  an assisted nursing facility with his wife and takes  his own meds.   Today, he denies symptoms of palpitations, chest pain, shortness of breath, orthopnea, PND,  dizziness, presyncope, syncope, snoring, daytime somnolence, bleeding, or neurologic sequela..The patient is tolerating medications without difficulties and is otherwise without complaint today.    Past Medical History:  Diagnosis Date  . Actinic keratosis   . BPH with urinary obstruction   . Bradycardia   . CAD (coronary artery disease)    CABG x5  . Cerebrovascular accident (stroke) ([place])    With right hemiparesis and persistent expressive aphasia  . GERD (gastroesophageal reflux disease)   . History of hydronephrosis   . Hx of hydronephrosis 07/08/2011   right   . Hyperlipidemia   . Hypertension   . Iron deficiency anemia due to chronic blood loss 03/26/2007   Now resolved    . Iron deficiency anemia secondary to blood loss (chronic)   . Persistent atrial fibrillation ([place])   . Pulmonary hypertension ([place])    by echo 2013  . Rosacea   . Situational depression    Severe  . 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 Tricuspid regurgitation    Past Surgical History:  Procedure Laterality Date  . ABDOMINAL SURGERY    . BACK SURGERY    . BAND HEMORRHOIDECTOMY    . CHOLECYSTECTOMY    . CORONARY ARTERY BYPASS GRAFT     x 5  . dental implants    . HERNIA REPAIR    . KIDNEY SURGERY     stenosis with stent  . TONSILLECTOMY        Allergies  Allergen Reactions  . Ciprofloxacin Hcl     unknown  . Sulfamethoxazole-Trimethoprim     unknown    Social History   Socioeconomic History  . Marital status: Married    Spouse name: Not on file  . Number of children: Not on file  . Years of education: Not on file  . Highest education level: Not on file  Occupational History  . Not on file  Tobacco Use  . Smoking status: Former Research scientist (life sciences)  . Smokeless tobacco: Never Used  . Tobacco comment: x 86 yo per the dtr  Substance and Sexual Activity  . Alcohol use: Yes    Alcohol/week: 1.0 standard drinks    Types:  1 Glasses of wine per week    Comment: glass of wine at 5  . Drug use: No  . Sexual activity: Not Currently  Other Topics Concern  . Not on file  Social History Narrative   Lives in [place] with spouse. LIves at a house at a retirement home. 3 children. 6 grandkids. 5 greatgrandkids.       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 Difficulty of Paying Living Expenses:   Food Insecurity:   . Worried About Charity fundraiser in the Last Year:   . Arboriculturist in the Last Year:   Transportation Needs:   . Film/video editor (Medical):   Marland Kitchen Lack of Transportation (Non-Medical):   Physical Activity:   . Days of Exercise per Week:   . Minutes of Exercise per Session:   Stress:   . Feeling of Stress  :   Social Connections:   . Frequency of Communication with Friends and Family:   . Frequency of Social Gatherings with Friends and Family:   . Attends Religious Services:   . Active Member of Clubs or Organizations:   . Attends Archivist Meetings:   Marland Kitchen Marital Status:   Intimate Partner Violence:   . Fear of Current or Ex-Partner:   . Emotionally Abused:   Marland Kitchen Physically Abused:   . Sexually Abused:     Family History  Problem Relation Age of Onset  . Heart disease Father   . Heart attack Other        fhx  . Coronary artery disease Other        fhx   The patient does not have a history of early familial atrial fibrillation or other arrhythmias.  ROS- All systems are reviewed and negative except as per the HPI above.  Physical Exam: GEN- The patient is elderly  appearing, alert and oriented x 3 today, aphasic   Head- normocephalic, atraumatic Eyes-  Sclera clear, conjunctiva pink Ears- hearing intact Oropharynx- clear Neck- supple, no JVP Lymph- no cervical lymphadenopathy Lungs- Clear to ausculation bilaterally, normal work of breathing Heart- Irregular rate and rhythm, no murmurs, rubs or gallops, PMI not laterally displaced GI- soft, NT, ND, + BS Extremities- no clubbing, cyanosis, or trace edema on rt, 1t on left (prior cabg) MS- no significant deformity or atrophy Skin- no rash or lesion Psych- euthymic mood, full affect Neuro- strength and sensation are intact  Ekg today shows afib at 49 bpm, LAD Holter monitor 02/2016-Study Highlights   Atrial fibrillation with controlled ventricular rates Average heart rate 58 bpm, max heart rate 98 bpm Nocturnal pauses of up to 3.2 seconds are noted,  No daytime pauses Occasional premature ventricular contractions with rare nonsustained ventricular tachycardia  11/2//20 monitor- Atrial Fibrillation occurred continuously (100% burden), ranging from 30- 93 bpm (avg of 53 bpm). 6 Pauses occurred, the longest lasting  3.3 secs ( at night)  (18 bpm). Isolated VEs were rare (<1.0%), VE Couplets were rare (<1.0%), and no VE Triplets were present. Ventricular Bigeminy was present. No indication for PPM   Assessment and Plan:  1. Atrial fibrillation The patient has asymptomatic longstanding persistent atrial fibrillation with slow ventricular response,  v rates in the upper 40's today(stable) Monitor placed in January 2020, did  not show indication for PPM.  Continue  eliquis to 2.5 mg  bid     2. Previous CVA with aphasia Continue Eliquis with reduction in dose with chadsvasc of at least 6.  3. Bradycardia  Stable No presyncopal or syncopal episodes   4.. HTN/edema Stable for pt Seeing a Kidney MD now as well as PCP is watching   F/u in 6 months    Butch Penny C. Raniyah Curenton, [facility] [address] [place],  [address] [phone]

## 2019-07-14 DIAGNOSIS — H353133 Nonexudative age-related macular degeneration, bilateral, advanced atrophic without subfoveal involvement: Secondary | ICD-10-CM | POA: Diagnosis not present

## 2019-08-08 ENCOUNTER — Telehealth: Payer: Self-pay | Admitting: Family Medicine

## 2019-08-08 ENCOUNTER — Ambulatory Visit (INDEPENDENT_AMBULATORY_CARE_PROVIDER_SITE_OTHER): Payer: Medicare Other | Admitting: Family

## 2019-08-08 ENCOUNTER — Other Ambulatory Visit: Payer: Self-pay

## 2019-08-08 VITALS — BP 140/70 | HR 52 | Temp 98.3°F | Ht 72.0 in | Wt 195.0 lb

## 2019-08-08 DIAGNOSIS — L03115 Cellulitis of right lower limb: Secondary | ICD-10-CM | POA: Diagnosis not present

## 2019-08-08 LAB — CBC WITH DIFFERENTIAL/PLATELET
Basophils Absolute: 0 10*3/uL (ref 0.0–0.1)
Basophils Relative: 0.5 % (ref 0.0–3.0)
Eosinophils Absolute: 0.2 10*3/uL (ref 0.0–0.7)
Eosinophils Relative: 3.2 % (ref 0.0–5.0)
HCT: 35 % — ABNORMAL LOW (ref 39.0–52.0)
Hemoglobin: 11.9 g/dL — ABNORMAL LOW (ref 13.0–17.0)
Lymphocytes Relative: 23 % (ref 12.0–46.0)
Lymphs Abs: 1.4 10*3/uL (ref 0.7–4.0)
MCHC: 33.9 g/dL (ref 30.0–36.0)
MCV: 95.2 fl (ref 78.0–100.0)
Monocytes Absolute: 1.1 10*3/uL — ABNORMAL HIGH (ref 0.1–1.0)
Monocytes Relative: 17 % — ABNORMAL HIGH (ref 3.0–12.0)
Neutro Abs: 3.5 10*3/uL (ref 1.4–7.7)
Neutrophils Relative %: 56.3 % (ref 43.0–77.0)
Platelets: 138 10*3/uL — ABNORMAL LOW (ref 150.0–400.0)
RBC: 3.68 Mil/uL — ABNORMAL LOW (ref 4.22–5.81)
RDW: 14.9 % (ref 11.5–15.5)
WBC: 6.2 10*3/uL (ref 4.0–10.5)

## 2019-08-08 MED ORDER — CEPHALEXIN 500 MG PO CAPS: 500.0000 mg | ORAL_CAPSULE | Freq: Three times a day (TID) | ORAL | 0 refills | Status: AC

## 2019-08-08 NOTE — Telephone Encounter (Signed)
Should this be seen sooner?

## 2019-08-08 NOTE — Progress Notes (Signed)
[name] is a 86 y.o. male with the following history as recorded in EpicCare:  Patient Active Problem List   Diagnosis Date Noted  . 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 Left wrist pain 11/01/2016  . Venous insufficiency 10/02/2016  . Right hydrocele 04/03/2016  . Thrombocytopenia ([place]) 11/22/2015  . CKD (chronic kidney disease), stage III 11/22/2015  . Sensorineural hearing loss (SNHL), bilateral 07/06/2015  . Memory loss 07/21/2014  . Bradycardia 07/09/2011  . Chronic atrial fibrillation ([place]) 07/08/2011  . Actinic keratosis 07/30/2009  . Major depression in remission ([place]) 07/26/2007  . 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  . Hemiparesis affecting right side as late effect of cerebrovascular accident ([place]) 03/26/2007  . Hyperlipidemia 08/23/2006  . Essential hypertension 08/23/2006  . CAD (coronary artery disease) 08/23/2006  . GERD 08/23/2006    Current Outpatient Medications  Medication Sig Dispense Refill  . amLODipine (NORVASC) 2.5 MG tablet TAKE 1 TABLET(2.5 MG) BY MOUTH DAILY 90 tablet 1  . apixaban (ELIQUIS) 2.5 MG TABS tablet Take 1 tablet (2.5 mg total) by mouth 2 (two) times daily. 60 tablet 6  . escitalopram (LEXAPRO) 20 MG tablet One tab daily 90 tablet 1  . furosemide (LASIX) 40 MG tablet Take 40 mg by mouth. Per [facility]    . Lansoprazole (PREVACID PO) Take 15 mg by mouth daily.    . memantine (NAMENDA) 10 MG tablet One tab daily 90 tablet 0  . Multiple Vitamin (MULTIVITAMIN) tablet Take 1 tablet by mouth daily.      . rosuvastatin (CRESTOR) 20 MG tablet TAKE 1 TABLET BY MOUTH EVERY DAY 90 tablet 2  . tamsulosin (FLOMAX) 0.4 MG CAPS capsule TAKE ONE CAPSULE BY MOUTH AT BEDTIME 90 capsule 3  . triamcinolone cream (KENALOG) 0.1 % Apply 1 application topically 2 (two) times daily. For 7-10 days maximum 160 g 0  . cephALEXin (KEFLEX) 500 MG capsule Take 1  capsule (500 mg total) by mouth 3 (three) times daily for 10 days. 30 capsule 0   No current facility-administered medications for this visit.    Allergies: Ciprofloxacin hcl and Sulfamethoxazole-trimethoprim  Past Medical History:  Diagnosis Date  . Actinic keratosis   . BPH with urinary obstruction   . Bradycardia   . CAD (coronary artery disease)    CABG x5  . Cerebrovascular accident (stroke) ([place])    With right hemiparesis and persistent expressive aphasia  . GERD (gastroesophageal reflux disease)   . History of hydronephrosis   . Hx of hydronephrosis 07/08/2011   right   . Hyperlipidemia   . Hypertension   . Iron deficiency anemia due to chronic blood loss 03/26/2007   Now resolved    . Iron deficiency anemia secondary to blood loss (chronic)   . Persistent atrial fibrillation ([place])   . Pulmonary hypertension ([place])    by echo 2013  . Rosacea   . Situational depression    Severe  . 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 Tricuspid regurgitation     Past Surgical History:  Procedure Laterality Date  . ABDOMINAL SURGERY    . BACK SURGERY    . BAND HEMORRHOIDECTOMY    . CHOLECYSTECTOMY    . CORONARY ARTERY  BYPASS GRAFT     x 5  . dental implants    . HERNIA REPAIR    . KIDNEY SURGERY     stenosis with stent  . TONSILLECTOMY      Family History  Problem Relation Age of Onset  . Heart disease Father   . Heart attack Other        fhx  . Coronary artery disease Other        fhx    Social History   Tobacco Use  . Smoking status: Former Research scientist (life sciences)  . Smokeless tobacco: Never Used  . Tobacco comment: x 86 yo per the dtr  Substance Use Topics  . Alcohol use: Yes    Alcohol/week: 1.0 standard drink    Types: 1 Glasses of wine per week    Comment: glass of wine at 5    Subjective:  Accompanied by his daughter who helps  to provide history today; patient has aphasia secondary to CVA; 1 week history of redness/ swelling of right lower leg; per daughter, the patient's wife became concerned when she saw blood this morning where patient was walking to the bathroom; the family did notice a small cut on the medial side of the patient's left leg;  Has chronic issues with swelling/ poor circulation in the right leg; has had cellulitis in his right lower leg;   Objective:  Vitals:   08/08/19 1551  BP: 140/70  Pulse: (!) 52  Temp: 98.3 F (36.8 C)  TempSrc: Oral  SpO2: 98%  Weight: 195 lb (88.5 kg)  Height: 6' (1.829 m)    General: Well developed, well nourished, in no acute distress  Skin : Warm and dry. Marked redness and swelling noted over right lower extremity;  Head: Normocephalic and atraumatic  Lungs: Respirations unlabored;  Extremities: + edema of right lower leg/ right foot Neurologic: Aphasia; uses walker for support  Assessment:  1. Cellulitis of right lower extremity     Plan:  Check CBC today; discussed that patient is seen at 4:00 pm on a Friday afternoon and cannot get any type of testing done outpatient; will go ahead and start antibiotics to treat the cellulitis; daughter notes they can get a nurse to monitor the leg at his independent living facility over the weekend; strict ER precautions for the upcoming weekend; keep follow-up with his PCP for re-check on Monday;  This visit occurred during the SARS-CoV-2 public health emergency.  Safety protocols were in place, including screening questions prior to the visit, additional usage of staff PPE, and extensive cleaning of exam room while observing appropriate contact time as indicated for disinfecting solutions.      No follow-ups on file.  Orders Placed This Encounter  Procedures  . CBC with Differential/Platelet    Requested Prescriptions   Signed Prescriptions Disp Refills  . cephALEXin (KEFLEX) 500 MG capsule 30 capsule 0    Sig:  Take 1 capsule (500 mg total) by mouth 3 (three) times daily for 10 days.

## 2019-08-08 NOTE — Telephone Encounter (Signed)
I think reasonable to see sooner if you see a good spot to work him in this afternoon

## 2019-08-08 NOTE — Telephone Encounter (Signed)
Nurse Assessment Nurse: Vallery Sa, RN, Cathy Date/Time (Eastern Time): 08/08/2019 10:34:40 AM Confirm and document reason for call. If symptomatic, describe symptoms. ---[name] states that Juanda Crumble developed bleeding from a wound his right leg this morning. No fever. No new injury or cut to the skin. Alert and responsive. Has the patient had close contact with a person known or suspected to have the novel coronavirus illness OR traveled / lives in area with major community spread (including international travel) in the last 14 days from the onset of symptoms? * If Asymptomatic, screen for exposure and travel within the last 14 days. ---No Does the patient have any new or worsening symptoms? ---Yes Will a triage be completed? ---Yes Related visit to physician within the last 2 weeks? ---Yes Does the PT have any chronic conditions? (i.e. diabetes, asthma, this includes High risk factors for pregnancy, etc.) ---Yes List chronic conditions. ---Wound on leg, Unknown Heart problems (may be taking blood thinners), caller unsure of his medical history Is this a behavioral health or substance abuse call? ---No Guidelines Guideline Title Affirmed Question Affirmed Notes Nurse Date/Time (Eastern Time) Wound Infection [1] Pus or cloudy fluid draining from wound AND [2] no fever Vallery Sa, RN, [place] 08/08/2019 10:38:02 AMPLEASE NOTE: All timestamps contained within this report are represented as [place] Standard Time. CONFIDENTIALTY NOTICE: This fax transmission is intended only for the addressee. It contains information that is legally privileged, confidential or otherwise protected from use or disclosure. If you are not the intended recipient, you are strictly prohibited from reviewing, disclosing, copying using or disseminating any of this information or taking any action in reliance on or regarding this information. If you have received this fax in error, please notify us immediately by telephone so  that we can arrange for its return to [place]. Phone: [phone], Toll-Free: [phone], Fax: [phone] Page: 2 of 2 Call Id: [id number] Guidelines Guideline Title Affirmed Question Affirmed Notes Nurse Date/Time Eilene Ghazi Time) Skin Injury Minor cut or scratch Trumbull, RN, [facility] 08/08/2019 10:39:33 AM Disp. Time Eilene Ghazi Time) Disposition Final User 08/08/2019 10:39:11 AM See PCP within 24 Hours Trumbull, RN, [place] 08/08/2019 10:43:34 AM See PCP within 24 Hours Yes Trumbull, RN, [place] Disposition Overriden: Home Care Override Reason: Patient's symptoms need a higher level of care Caller Disagree/Comply Comply Caller Understands Yes PreDisposition Call Doctor Care Advice Given Per Guideline SEE PCP WITHIN 24 HOURS: * IF OFFICE WILL BE OPEN: You need to be examined within the next 24 hours. Call your doctor (or NP/PA) when the office opens and make an appointment. CALL BACK IF: * Fever occurs * You become worse. CARE ADVICE per Wound Infection (Adult) guideline. HOME CARE: * You should be able to treat this at home. REASSURANCE AND EDUCATION - MINOR CUT OR SCRATCH: * It sounds like a minor cut or scratch that we can treat at home. APPLY DIRECT PRESSURE TO STOP BLEEDING: * Put direct pressure on the wound for 10 minutes to stop any bleeding. * Use a clean cloth or gauze pad. CALL BACK IF: * You become worse. SEE PCP WITHIN 24 HOURS: * IF OFFICE WILL BE OPEN: You need to be examined within the next 24 hours. Call your doctor (or NP/PA) when the office opens and make an appointment. APPLY DIRECT PRESSURE TO STOP BLEEDING: * Put direct pressure on the wound for 10 minutes to stop any bleeding. * Use a clean cloth or gauze pad. CALL BACK IF: * Fever occurs * You become worse. CARE ADVICE given per Skin Injury (  Adult) guideline. DRESSING THE WOUND: * Cover the wound with a dressing. * Use a sterile gauze, an adhesive bandage (such as a Band-Aid), or a clean cloth. Comments User: Berton Mount,  RN Date/Time Eilene Ghazi Time): 08/08/2019 10:45:27 AM [name] unable to give full health history on [place]. Transferred to [facility] via the office backline to check on appointment options today so that MD can take a look at the wound area on his leg

## 2019-08-11 ENCOUNTER — Other Ambulatory Visit: Payer: Self-pay | Admitting: *Deleted

## 2019-08-11 ENCOUNTER — Encounter: Payer: Self-pay | Admitting: Family Medicine

## 2019-08-11 ENCOUNTER — Other Ambulatory Visit: Payer: Self-pay | Admitting: Family Medicine

## 2019-08-11 ENCOUNTER — Ambulatory Visit (INDEPENDENT_AMBULATORY_CARE_PROVIDER_SITE_OTHER): Payer: Medicare Other | Admitting: Family Medicine

## 2019-08-11 ENCOUNTER — Other Ambulatory Visit: Payer: Self-pay

## 2019-08-11 VITALS — BP 122/60 | HR 72 | Temp 98.6°F | Ht 72.0 in | Wt 196.8 lb

## 2019-08-11 DIAGNOSIS — S81811D Laceration without foreign body, right lower leg, subsequent encounter: Secondary | ICD-10-CM

## 2019-08-11 DIAGNOSIS — D649 Anemia, unspecified: Secondary | ICD-10-CM

## 2019-08-11 DIAGNOSIS — I10 Essential (primary) hypertension: Secondary | ICD-10-CM

## 2019-08-11 DIAGNOSIS — L03115 Cellulitis of right lower limb: Secondary | ICD-10-CM | POA: Diagnosis not present

## 2019-08-11 DIAGNOSIS — Z23 Encounter for immunization: Secondary | ICD-10-CM | POA: Diagnosis not present

## 2019-08-11 MED ORDER — APIXABAN 2.5 MG PO TABS: 2.5000 mg | ORAL_TABLET | Freq: Two times a day (BID) | ORAL | 6 refills | Status: DC

## 2019-08-11 NOTE — Progress Notes (Signed)
Scheduled to see his PCP today in follow-up;

## 2019-08-11 NOTE — Progress Notes (Signed)
Phone [phone] In person visit   Subjective:   [name] is a 86 y.o. year old very pleasant male patient who presents for/with See problem oriented charting Chief Complaint  Patient presents with  . Hypertension  . Anemia   This visit occurred during the SARS-CoV-2 public health emergency.  Safety protocols were in place, including screening questions prior to the visit, additional usage of staff PPE, and extensive cleaning of exam room while observing appropriate contact time as indicated for disinfecting solutions.   Past Medical History-  Patient Active Problem List   Diagnosis Date Noted  . Memory loss 07/21/2014    Priority: High  . Chronic atrial fibrillation ([place]) 07/08/2011    Priority: High  . Hemiparesis affecting right side as late effect of cerebrovascular accident ([place]) 03/26/2007    Priority: High  . CAD (coronary artery disease) 08/23/2006    Priority: High  . Venous insufficiency 10/02/2016    Priority: Medium  . Right hydrocele 04/03/2016    Priority: Medium  . Thrombocytopenia ([place]) 11/22/2015    Priority: Medium  . CKD (chronic kidney disease), stage III 11/22/2015    Priority: Medium  . Bradycardia 07/09/2011    Priority: Medium  . Major depression in remission ([place]) 07/26/2007    Priority: Medium  . 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    Priority: Medium  . Hyperlipidemia 08/23/2006    Priority: Medium  . Essential hypertension 08/23/2006    Priority: Medium  . Actinic keratosis 07/30/2009    Priority: Low  . GERD 08/23/2006    Priority: Low  . 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 Left wrist pain 11/01/2016  . Sensorineural hearing loss (SNHL), bilateral 07/06/2015    Medications- reviewed and updated Current Outpatient Medications  Medication Sig Dispense Refill  . amLODipine (NORVASC) 2.5 MG tablet TAKE 1 TABLET(2.5 MG) BY MOUTH  DAILY 90 tablet 1  . apixaban (ELIQUIS) 2.5 MG TABS tablet Take 1 tablet (2.5 mg total) by mouth 2 (two) times daily. 60 tablet 6  . cephALEXin (KEFLEX) 500 MG capsule Take 1 capsule (500 mg total) by mouth 3 (three) times daily for 10 days. 30 capsule 0  . escitalopram (LEXAPRO) 20 MG tablet One tab daily 90 tablet 1  . furosemide (LASIX) 40 MG tablet Take 40 mg by mouth. Per [facility]    . Lansoprazole (PREVACID PO) Take 15 mg by mouth daily.    . memantine (NAMENDA) 10 MG tablet One tab daily 90 tablet 0  . Multiple Vitamin (MULTIVITAMIN) tablet Take 1 tablet by mouth daily.      . rosuvastatin (CRESTOR) 20 MG tablet TAKE 1 TABLET BY MOUTH EVERY DAY 90 tablet 2  . tamsulosin (FLOMAX) 0.4 MG CAPS capsule TAKE ONE CAPSULE BY MOUTH AT BEDTIME 90 capsule 3  . triamcinolone cream (KENALOG) 0.1 % Apply 1 application topically 2 (two) times daily. For 7-10 days maximum 160 g 0   No current facility-administered medications for this visit.     Objective:  BP 122/60   Pulse 72   Temp 98.6 F (37 C) (Temporal)   Ht 6' (1.829 m)   Wt 196 lb 12.8 oz (89.3 kg)   SpO2 95%   BMI 26.69 kg/m  Gen: NAD, resting comfortably CV: RRR no murmurs rubs or gallops Lungs: CTAB no crackles, wheeze, rhonchi Abdomen: soft/nontender/nondistended/normal bowel sounds. No rebound or guarding.  Ext: Trace edema on the left.  1+ edema on the right associated with erythema up majority of his shin-some warmth to palpation.  Patient also has a wound on medial portion of right lower extremity     Assessment and Plan   # Cellulitis right leg  S:Seen on 08/08/2019 started on Keflex by [place].  Patient symptoms likely started at least a week prior-seems like his wife later admitted the leg it started to get red.  Patient's daughter also reported he seemed more sleepy/tired week prior to starting antibiotics.  Patient has venous insufficiency and a history of not wearing compression stockings.  Has  had cellulitis on right leg multiple times-has been checked for DVT on multiple occasions with similar presentations  Since starting Keflex 5 mg 3 times a day-Daughter feels like it looks a little better not as warm to touch. Declines any drainage from leg.   He did sustain a wound on right lower extremity at some point-he is not sure when this occurred. A/P: Cellulitis of right lower extremity appears to be improving.  Baseline venous insufficiency and recurrent issues on this leg-recommended regular use of compression stockings and long as no significant pain in the lower leg.  Also recommended elevating the leg  Anemia S:CBC done at another office three days ago. Not currently taking any iron supplement.  Fecal occult blood was negative in January A/P: Hemoglobin slightly improved on last check-continue to monitor at least every 6 months.  Given stability I do not think we need to investigate further at this time.  #hypertension S: medication: Currently talking amlodipine.  He is also on Lasix per [facility] Home readings #s: He can not tell us what his readings have been.  BP Readings from Last 3 Encounters:  08/11/19 122/60  08/08/19 140/70  07/01/19 (!) 158/66  A/P: Excellent control-continue current medication -Prior blood pressure elevations could have been related to Aleve use-chronic kidney was able to get a compression from patient he had been using these #5 looks much better today.  [facility] kidney noted chronic lymphedema after CABG  Recommended follow up: November physical planned Future Appointments  Date Time Provider [place]  12/29/2019  4:00 PM [name], MD [facility]    Lab/Order associations:   ICD-10-CM   1. Cellulitis of right lower extremity  L03.115   2. Essential hypertension  I10   3. Anemia, unspecified type  D64.9   4. Laceration of right lower extremity, subsequent encounter  S81.811D Td : Tetanus/diphtheria >7yo Preservative  free    Return precautions advised.  [name], MD

## 2019-08-11 NOTE — Patient Instructions (Addendum)
Health Maintenance Due  Topic Date Due  . COVID-19 Vaccine (1) will call with dates  Never done  . TETANUS/TDAP has not had  02/28/2015   Cellulitis of leg: after we get healed up it is very important that you wear your compression stockings to help prevent in future. We are going to do a Tetanus today. Make sure you elevate your leg as much as you can. If still having issues after you finish your antibiotic let our office know. Dr. Yong Channel will be out of the office but you will be able to see someone in our office.     Anemia: Your levels were checked Friday. It looks like your anemia is stable.    If you have any questions or concerns before your next scheduled appointment please give our office a call.   Td (Tetanus, Diphtheria) Vaccine: What You Need to Know 1. Why get vaccinated? Td vaccine can prevent tetanus and diphtheria. Tetanus enters the body through cuts or wounds. Diphtheria spreads from person to person.  TETANUS (T) causes painful stiffening of the muscles. Tetanus can lead to serious health problems, including being unable to open the mouth, having trouble swallowing and breathing, or death.  DIPHTHERIA (D) can lead to difficulty breathing, heart failure, paralysis, or death. 2. Td vaccine Td is only for children 7 years and older, adolescents, and adults.  Td is usually given as a booster dose every 10 years, but it can also be given earlier after a severe and dirty wound or burn. Another vaccine, called Tdap, that protects against pertussis, also known as "whooping cough," in addition to tetanus and diphtheria, may be used instead of Td.  Td may be given at the same time as other vaccines. 3. Talk with your health care provider Tell your vaccine provider if the person getting the vaccine:  Has had an allergic reaction after a previous dose of any vaccine that protects against tetanus or diphtheria, or has any severe, life-threatening allergies.  Has ever had  Guillain-Barr Syndrome (also called GBS).  Has had severe pain or swelling after a previous dose of any vaccine that protects against tetanus or diphtheria. In some cases, your health care provider may decide to postpone Td vaccination to a future visit.  People with minor illnesses, such as a cold, may be vaccinated. People who are moderately or severely ill should usually wait until they recover before getting Td vaccine.  Your health care provider can give you more information. 4. Risks of a vaccine reaction  Pain, redness, or swelling where the shot was given, mild fever, headache, feeling tired, and nausea, vomiting, diarrhea, or stomachache sometimes happen after Td vaccine. People sometimes faint after medical procedures, including vaccination. Tell your provider if you feel dizzy or have vision changes or ringing in the ears.  As with any medicine, there is a very remote chance of a vaccine causing a severe allergic reaction, other serious injury, or death. 5. What if there is a serious problem? An allergic reaction could occur after the vaccinated person leaves the clinic. If you see signs of a severe allergic reaction (hives, swelling of the face and throat, difficulty breathing, a fast heartbeat, dizziness, or weakness), call 9-1-1 and get the person to the nearest hospital.  For other signs that concern you, call your health care provider.  Adverse reactions should be reported to the Vaccine Adverse Event Reporting System (VAERS). Your health care provider will usually file this report, or you can do  it yourself. Visit the VAERS website at www.vaers.SamedayNews.es or call 3198568267. VAERS is only for reporting reactions, and VAERS staff do not give medical advice. 6. The National Vaccine Injury Compensation Program The [employer] Vaccine Injury Compensation Program (VICP) is a federal program that was created to compensate people who may have been injured by certain vaccines. Visit the VICP  website at GoldCloset.com.ee or call 780-133-7124 to learn about the program and about filing a claim. There is a time limit to file a claim for compensation. 7. How can I learn more?  Ask your health care provider.  Call your local or state health department.  Contact the Centers for Disease Control and Prevention (CDC): ? Call (770) 578-5398 (1-800-CDC-INFO) or ? Visit CDC's website at http://hunter.com/ Vaccine Information Statement Td Vaccine (05/29/18) This information is not intended to replace advice given to you by your health care provider. Make sure you discuss any questions you have with your health care provider. Document Revised: 07/08/2018 Document Reviewed: 06/10/2018 Elsevier Patient Education  [place].

## 2019-08-15 ENCOUNTER — Other Ambulatory Visit: Payer: Self-pay | Admitting: Family Medicine

## 2019-09-02 ENCOUNTER — Other Ambulatory Visit: Payer: Self-pay | Admitting: Family Medicine

## 2019-09-12 DIAGNOSIS — N184 Chronic kidney disease, stage 4 (severe): Secondary | ICD-10-CM | POA: Diagnosis not present

## 2019-09-19 DIAGNOSIS — N184 Chronic kidney disease, stage 4 (severe): Secondary | ICD-10-CM | POA: Diagnosis not present

## 2019-09-19 DIAGNOSIS — D631 Anemia in chronic kidney disease: Secondary | ICD-10-CM | POA: Diagnosis not present

## 2019-09-19 DIAGNOSIS — I129 Hypertensive chronic kidney disease with stage 1 through stage 4 chronic kidney disease, or unspecified chronic kidney disease: Secondary | ICD-10-CM | POA: Diagnosis not present

## 2019-09-19 DIAGNOSIS — N281 Cyst of kidney, acquired: Secondary | ICD-10-CM | POA: Diagnosis not present

## 2019-11-06 ENCOUNTER — Telehealth: Payer: Self-pay | Admitting: Family Medicine

## 2019-11-06 MED ORDER — TRIAMCINOLONE ACETONIDE 0.1 % EX CREA: 1.0000 "application " | TOPICAL_CREAM | Freq: Two times a day (BID) | CUTANEOUS | 0 refills | Status: DC

## 2019-11-06 NOTE — Telephone Encounter (Signed)
Rx sent in

## 2019-11-06 NOTE — Telephone Encounter (Signed)
  LAST APPOINTMENT DATE: 09/02/2019   NEXT APPOINTMENT DATE:@11 /24/2021  MEDICATION: triamcinolone cream (KENALOG) 0.1 %  PHARMACY: [id number] - [place], [address] AT [address]

## 2019-11-12 ENCOUNTER — Other Ambulatory Visit: Payer: Self-pay | Admitting: Family Medicine

## 2019-12-29 ENCOUNTER — Encounter: Payer: Medicare Other | Admitting: Family Medicine

## 2020-01-05 ENCOUNTER — Other Ambulatory Visit: Payer: Self-pay

## 2020-01-05 ENCOUNTER — Ambulatory Visit (HOSPITAL_COMMUNITY)
Admission: RE | Admit: 2020-01-05 | Discharge: 2020-01-05 | Disposition: A | Discharge status: Home / Self Care | Payer: Medicare Other | Source: Ambulatory Visit | Attending: Nurse Practitioner | Admitting: Nurse Practitioner

## 2020-01-05 ENCOUNTER — Encounter (HOSPITAL_COMMUNITY): Payer: Self-pay | Admitting: Nurse Practitioner

## 2020-01-05 VITALS — BP 136/60 | HR 54 | Wt 200.7 lb

## 2020-01-05 DIAGNOSIS — Z881 Allergy status to other antibiotic agents status: Secondary | ICD-10-CM | POA: Diagnosis not present

## 2020-01-05 DIAGNOSIS — I10 Essential (primary) hypertension: Secondary | ICD-10-CM | POA: Insufficient documentation

## 2020-01-05 DIAGNOSIS — I4811 Longstanding persistent atrial fibrillation: Secondary | ICD-10-CM | POA: Diagnosis not present

## 2020-01-05 DIAGNOSIS — I69320 Aphasia following cerebral infarction: Secondary | ICD-10-CM | POA: Diagnosis not present

## 2020-01-05 DIAGNOSIS — D6869 Other thrombophilia: Secondary | ICD-10-CM

## 2020-01-05 DIAGNOSIS — R001 Bradycardia, unspecified: Secondary | ICD-10-CM | POA: Insufficient documentation

## 2020-01-05 DIAGNOSIS — I4821 Permanent atrial fibrillation: Secondary | ICD-10-CM

## 2020-01-05 DIAGNOSIS — Z882 Allergy status to sulfonamides status: Secondary | ICD-10-CM | POA: Diagnosis not present

## 2020-01-05 DIAGNOSIS — Z8249 Family history of ischemic heart disease and other diseases of the circulatory system: Secondary | ICD-10-CM | POA: Diagnosis not present

## 2020-01-05 DIAGNOSIS — Z951 Presence of aortocoronary bypass graft: Secondary | ICD-10-CM | POA: Diagnosis not present

## 2020-01-05 DIAGNOSIS — Z87891 Personal history of nicotine dependence: Secondary | ICD-10-CM | POA: Diagnosis not present

## 2020-01-05 NOTE — Progress Notes (Signed)
[name], male   DOB: February 15, 1929, 86 y.o.   MRN: [id number]       Primary Care Physician: [name], MD Primary Electrophysiologist: Dr. Rachelle Hora is a 86 y.o. male with a h/o long standing persistent atrial fibrillation who presents for f/u in the [facility]. The patient is aphasic due to previous stroke and is here with his daughter. He is off chronotropic drugs due to bradycardia. He  has a v rate of 48  bpm today. Daughter does not think he has been as active lately, but he does drive and goes to the grocery store. Does not report any lightheadedness of falls.  Taking eliquis without bleeding issues with a chadsvasc of at least 6. Continues on eliquis  to 2.5 mg bid. He has been seeing a kidney MD. Some BP meds have been adjusted by PCP lately.     He does live in  an assisted nursing facility with his wife and takes  his own meds.   F/u in afib clinic, 11/15. He remains stable with afib in the 50's. He walks  with a cane. Only one small fall at night getting out of the bed to urinate. No injuries. Has had chronic swelling of his rt lower extremity. Treated last June for cellulitis of this leg by PCP. Has had cellulitis of this leg before as well. Denies any lightheadedness or dizziness. Remains aphasic from a prior stroke. Here with daughter. Lives with wife.  Today, he denies symptoms of palpitations, chest pain, shortness of breath, orthopnea, PND,  dizziness, presyncope, syncope, snoring, daytime somnolence, bleeding, or neurologic sequela..The patient is tolerating medications without difficulties and is otherwise without complaint today.    Past Medical History:  Diagnosis Date  . Actinic keratosis   . BPH with urinary obstruction   . Bradycardia   . CAD (coronary artery disease)    CABG x5  . Cerebrovascular accident (stroke) ([place])    With right hemiparesis and persistent expressive aphasia  . GERD  (gastroesophageal reflux disease)   . History of hydronephrosis   . Hx of hydronephrosis 07/08/2011   right   . Hyperlipidemia   . Hypertension   . Iron deficiency anemia due to chronic blood loss 03/26/2007   Now resolved    . Iron deficiency anemia secondary to blood loss (chronic)   . Persistent atrial fibrillation ([place])   . Pulmonary hypertension ([place])    by echo 2013  . Rosacea   . Situational depression    Severe  . 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 Tricuspid regurgitation    Past Surgical History:  Procedure Laterality Date  . ABDOMINAL SURGERY    . BACK SURGERY    . BAND HEMORRHOIDECTOMY    . CHOLECYSTECTOMY    . CORONARY ARTERY BYPASS GRAFT     x 5  . dental implants    . HERNIA REPAIR    . KIDNEY SURGERY     stenosis with stent  . TONSILLECTOMY        Allergies  Allergen Reactions  . Ciprofloxacin Hcl     unknown  . Sulfamethoxazole-Trimethoprim     unknown    Social History   Socioeconomic History  . Marital status: Married    Spouse name: Not  on file  . Number of children: Not on file  . Years of education: Not on file  . Highest education level: Not on file  Occupational History  . Not on file  Tobacco Use  . Smoking status: Former Research scientist (life sciences)  . Smokeless tobacco: Never Used  . Tobacco comment: x 86 yo per the dtr  Substance and Sexual Activity  . Alcohol use: Yes    Alcohol/week: 1.0 standard drink    Types: 1 Glasses of wine per week    Comment: glass of wine at 7  . Drug use: No  . Sexual activity: Not Currently  Other Topics Concern  . Not on file  Social History Narrative   Lives in [place] with spouse. LIves at a house at a retirement home. 3 children. 6 grandkids. 5 greatgrandkids.       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 Difficulty of Paying Living Expenses: Not on file  Food Insecurity:   . Worried About Charity fundraiser in the Last Year: Not on file  . Ran Out of Food in the Last Year: Not on file  Transportation Needs:   . Lack of Transportation (Medical): Not on file  . Lack of Transportation (Non-Medical): Not on file  Physical Activity:   . Days of Exercise per Week: Not on file  . Minutes of Exercise per Session: Not on file  Stress:   . Feeling of Stress : Not on file  Social Connections:   . Frequency of Communication with Friends and Family: Not on file  . Frequency of Social Gatherings with Friends and Family: Not on file  . Attends Religious Services: Not on file  . Active Member of Clubs or Organizations: Not on file  . Attends Archivist Meetings: Not on file  . Marital Status: Not on file  Intimate Partner Violence:   . Fear of Current or Ex-Partner: Not on file  . Emotionally Abused: Not on file  . Physically Abused: Not on file  . Sexually Abused: Not on file    Family History  Problem Relation Age of Onset  . Heart disease Father   . Heart attack Other        fhx  . Coronary artery disease Other        fhx   The patient does not have a history of early familial atrial fibrillation or other arrhythmias.  ROS- All systems are reviewed and negative except as per the HPI above.  Physical Exam: GEN- The patient is elderly  appearing, alert and oriented x 3 today, aphasic   Head- normocephalic, atraumatic Eyes-  Sclera clear, conjunctiva pink Ears- hearing intact Oropharynx- clear Neck- supple, no JVP Lymph- no cervical lymphadenopathy Lungs- Clear to ausculation bilaterally, normal work of breathing Heart- Irregular rate and rhythm, no murmurs, rubs or gallops, PMI not laterally displaced GI- soft, NT, ND, + BS Extremities- no clubbing, cyanosis, or trace edema on rt, 1t on left (prior  cabg) MS- no significant deformity or atrophy Skin- no rash or lesion Psych- euthymic mood, full affect Neuro- strength and sensation are intact  Ekg today shows afib at 49 bpm, LAD Holter monitor 02/2016-Study Highlights   Atrial fibrillation with controlled ventricular rates Average heart rate 58 bpm, max heart rate 98 bpm Nocturnal pauses of up to 3.2 seconds are noted,  No daytime pauses Occasional premature ventricular contractions with rare nonsustained ventricular tachycardia  11/2//20 monitor- Atrial Fibrillation occurred continuously (100% burden), ranging from 30- 93 bpm (avg of 53 bpm). 6 Pauses occurred, the longest lasting 3.3 secs ( at night)  (18 bpm). Isolated VEs were rare (<1.0%), VE Couplets were rare (<1.0%), and no VE Triplets were present. Ventricular Bigeminy was present. No indication for PPM   Assessment and Plan:  1. Atrial fibrillation The patient has asymptomatic longstanding persistent atrial fibrillation with slow ventricular response,  v rates in the mid 50's today Monitor placed in January 2020, did  not show indication for PPM.      2. Previous CVA with aphasia Continue Eliquis  2.5 with chadsvasc of at least 6.  3. Bradycardia  Stable No presyncopal or syncopal episodes   4.. HTN/edema Stable for pt Seeing a Kidney MD now as well as PCP is watching   F/u in 6 months    Butch Penny C. Rubert Frediani, [facility] [address] [place], [place] [address] [phone]

## 2020-01-19 DIAGNOSIS — I639 Cerebral infarction, unspecified: Secondary | ICD-10-CM | POA: Diagnosis not present

## 2020-01-20 NOTE — Patient Instructions (Addendum)
Health Maintenance Due  Topic Date Due  . COVID-19 Vaccine (1)- call us back with dates- also would be eligible for booster Never done  . INFLUENZA VACCINE - flu shot today high dose 09/28/2019   Wait 2 weeks then consider Please check with your pharmacy to see if they have the shingrix vaccine. If they do- please get this immunization and update [place] by phone call or mychart with dates you receive the vaccine  BP is slightly high today but was fine just about 2 weeks ago- do a few checks at home and if <140/90 lets simply follow up in 6 months. If higher let me know  Please stop by lab before you go If you have mychart- we will send your results within 3 business days of [place] receiving them.  If you do not have mychart- we will call you about results within 5 business days of [place] receiving them.  *please note we are currently using [facility] which has a longer processing time than [place] typically so labs may not come back as quickly as in the past *please also note that you will see labs on mychart as soon as they post. I will later go in and write notes on them- will say "notes from Dr. Yong Channel"   Can also sign up for wellness visit with nurse Otila Kluver if you would like

## 2020-01-20 NOTE — Progress Notes (Signed)
Phone: [phone]   Subjective:  Patient presents today for their annual physical. Chief complaint-noted.   See problem oriented charting- ROS- full  review of systems was completed and negative  except for: hearing loss, neck pain for about a week- falls asleep in chair and head not resting- recommended getting a pillow for this,  dizzy at times, bruises easily. Not wearing hearing aids  The following were reviewed and entered/updated in epic: Past Medical History:  Diagnosis Date  . Actinic keratosis   . BPH with urinary obstruction   . Bradycardia   . CAD (coronary artery disease)    CABG x5  . Cerebrovascular accident (stroke) ([place])    With right hemiparesis and persistent expressive aphasia  . GERD (gastroesophageal reflux disease)   . History of hydronephrosis   . Hx of hydronephrosis 07/08/2011   right   . Hyperlipidemia   . Hypertension   . Iron deficiency anemia due to chronic blood loss 03/26/2007   Now resolved    . Iron deficiency anemia secondary to blood loss (chronic)   . Persistent atrial fibrillation ([place])   . Pulmonary hypertension ([place])    by echo 2013  . Rosacea   . Situational depression    Severe  . 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 Tricuspid regurgitation    Patient Active Problem List   Diagnosis Date Noted  . Memory loss 07/21/2014    Priority: High  . Chronic atrial fibrillation ([place]) 07/08/2011    Priority: High  . Hemiparesis affecting right side as late effect of cerebrovascular accident ([place]) 03/26/2007    Priority: High  . CAD (coronary artery disease) 08/23/2006    Priority: High  . Macular degeneration 01/21/2020    Priority: Medium  . Venous insufficiency 10/02/2016    Priority: Medium  . Right hydrocele 04/03/2016    Priority: Medium  . Thrombocytopenia ([place]) 11/22/2015     Priority: Medium  . CKD (chronic kidney disease), stage III ([place]) 11/22/2015    Priority: Medium  . Bradycardia 07/09/2011    Priority: Medium  . Major depression in remission ([place]) 07/26/2007    Priority: Medium  . 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    Priority: Medium  . Hyperlipidemia 08/23/2006    Priority: Medium  . Essential hypertension 08/23/2006    Priority: Medium  . Actinic keratosis 07/30/2009    Priority: Low  . GERD 08/23/2006    Priority: Low  . 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 Left wrist pain 11/01/2016  . Sensorineural hearing loss (SNHL), bilateral 07/06/2015   Past Surgical History:  Procedure Laterality Date  . ABDOMINAL SURGERY    . BACK SURGERY    . BAND HEMORRHOIDECTOMY    . CHOLECYSTECTOMY    . CORONARY ARTERY BYPASS GRAFT     x 5  . dental implants    . HERNIA REPAIR    . KIDNEY SURGERY     stenosis with stent  . TONSILLECTOMY      Family History  Problem Relation Age of Onset  . Heart disease Father   . Heart attack Other        fhx  . Coronary artery disease Other  fhx    Medications- reviewed and updated Current Outpatient Medications  Medication Sig Dispense Refill  . amLODipine (NORVASC) 2.5 MG tablet TAKE 1 TABLET(2.5 MG) BY MOUTH DAILY 90 tablet 1  . apixaban (ELIQUIS) 2.5 MG TABS tablet Take 1 tablet (2.5 mg total) by mouth 2 (two) times daily. 60 tablet 6  . escitalopram (LEXAPRO) 20 MG tablet TAKE 1 TABLET BY MOUTH DAILY 90 tablet 1  . furosemide (LASIX) 40 MG tablet Take 40 mg by mouth. Per [facility]    . Lansoprazole (PREVACID PO) Take 15 mg by mouth daily.    . memantine (NAMENDA) 10 MG tablet Take 1 tablet (10 mg total) by mouth daily. 90 tablet 3  . Multiple Vitamin (MULTIVITAMIN) tablet Take 1 tablet by mouth daily.      . rosuvastatin (CRESTOR) 20 MG tablet TAKE 1 TABLET BY MOUTH EVERY DAY 90 tablet 3  .  tamsulosin (FLOMAX) 0.4 MG CAPS capsule TAKE ONE CAPSULE BY MOUTH AT BEDTIME 90 capsule 3  . triamcinolone cream (KENALOG) 0.1 % Apply 1 application topically 2 (two) times daily. For 7-10 days maximum 160 g 0   No current facility-administered medications for this visit.    Allergies-reviewed and updated Allergies  Allergen Reactions  . Ciprofloxacin Hcl     unknown  . Sulfamethoxazole-Trimethoprim     unknown    Social History   Social History Narrative   Lives in [place] with spouse. LIves at a house at a retirement home. 3 children. 6 grandkids. 5 greatgrandkids.       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 Oropharynx normal Neck: no thyromegaly CV:  Irregularly irregularno murmurs rubs or gallops Lungs: CTAB no crackles, wheeze, rhonchi Abdomen: soft/nontender/nondistended/normal bowel sounds. No rebound or guarding.  Ext: trace to 1+ edema- worse on right Skin: warm, dry Neuro: walks with cane, right sided hemiparesis, garbled speech- limited even more than last visit   Assessment and Plan  86 y.o. male presenting for annual physical.  Health Maintenance counseling: 1. Anticipatory guidance: Patient counseled regarding regular dental exams - advised q6 months- he declines, eye exams -q6 months  avoiding smoking and second hand smoke , limiting alcohol to 2 beverages per day - still 1 per day glass of wine 5 pm everyday- have advised caution with concerns about memory.   2. Risk factor reduction:  Advised patient of need for regular exercise and diet rich and fruits and vegetables to reduce risk of heart attack and stroke. Exercise- trying to stay active - encouraged regular exercise-  apparently is doing some walking at grocery store most days- daughter states good mobility such as if drops something. Diet-meals provided by retirement home and tries to avoid snacking. Weight down 3 lbs from last CPE but on lasix now Wt Readings from Last 3 Encounters:  01/21/20 200 lb 6.4 oz (90.9 kg)  01/05/20 200 lb 11.2 oz (91 kg)  08/11/19 196 lb 12.8 oz (89.3 kg)  3. Immunizations/screenings/ancillary studies- flu shot today high dose . Discussed covid 5 - already immunized in march- advised booster . Discussed shingrix at pharmacy   Immunization  History  Administered Date(s) Administered  . Influenza Split 01/09/2011  . Influenza Whole 02/28/2003, 12/18/2007, 11/24/2009, 11/08/2018  . Influenza, High Dose Seasonal PF 11/22/2015, 01/22/2018  . Influenza-Unspecified 11/27/2016  . Pneumococcal Conjugate-13 07/21/2014  . Pneumococcal Polysaccharide-23 02/28/2004  . Td 02/27/2005, 08/11/2019  4. Prostate cancer screening- past age based screening recommendations-no significant change in urinary symptoms Lab Results  Component Value Date   PSA 0.47 08/11/2013   PSA 0.52 04/26/2007   5. Colon cancer screening - no blood in the stool or melena.  Past age based screening recommendations 6. Skin cancer screening-follow-up with dermatology mainly as needed-last year advised follow-up due to possible AK's- several frozen.  7.  Former smoker-quit smoking over 50 years ago-no regular screening required 8. STD screening -active with wife only  Status of chronic or acute concerns   # Code status- difficult to know his wishes- will stay full code for now- but we discussed possibly changing status if wife does- would want it to be a family decisions.    # CKD III- From nephrologyfrom  Dr. Carolin Sicks "I saw Mr Fontanilla today for ckd.  He has b/l polycystic disease, cortical atrophy and significant LE edema. He was using 2-3 Aleve or advil daily at home.  I am checking basic labs today.   Discontinue maxzide and started lasix 40 mg. ... We probably need to go up on lasix depending on the response, mostly looks chronic lymphedema after CABG. "   #Hemiparesis affecting right side as late effect of cerebrovascular accident-stable right-sided hemiparesis after stroke in 2003 likely related to atrial fibrillation. Using cane  #Memory loss-remains on Namenda-previous prescribed by Dr. Arnoldo Morale.  We continue this due to possible vascular dementia.  Memory loss and garbled speech after prior stroke.  Appears stable-continue to monitor  #Atrial fibrillation-patient remains on Eliquis 5 mg twice daily.  Not on rate control medication but remains rate controlled.  Appears stable-continue current medication  #Coronary artery disease-patient remains on Crestor 20 mg for hyperlipidemia.  Has been on Eliquis alone without aspirin-aspirin was stopped July 2015.  We will not restart aspirin at this time. Lab Results  Component Value Date   CHOL 138 12/26/2018   HDL 61.40 12/26/2018   LDLCALC 64 12/26/2018   LDLDIRECT 66.0 06/01/2016   TRIG 66.0 12/26/2018   CHOLHDL 2 12/26/2018   #Thrombocytopenia-mild anemia noted on last visit-continue to monitor with CBC today Lab Results  Component Value Date   WBC 6.2 08/08/2019   HGB 11.9 (L) 08/08/2019   HCT 35.0 (L) 08/08/2019   MCV 95.2 08/08/2019   PLT 138.0 (L) 08/08/2019   #Major depression in remission-patient remains on Lexapro but difficult to monitor due to garbled speech and memory issues.  We have opted to continue current dose of medicine.  #Hypertension-previously controlled with triamterene hydrochlorothiazide 37.5-25 mg--> now on Lasix instead through [facility] and continued amlodipine 2.5 mg-continue current medication.   BP Readings from Last 3 Encounters:  01/21/20 (!) 152/78  01/05/20 136/60  08/11/19 122/60  BP is slightly high today but was fine just about 2 weeks ago- do a few checks at home and if <140/90 lets  simply follow up in 6 months. If higher let me know  #BPH-remains on Flomax with reasonable control.  #GERD-remains on Prevacid-B12 level adequate last visit Lab Results  Component Value Date   VITAMINB12 408 12/26/2018    #Patient still drives to the grocery store or drugstore-has not gotten lost and has not had any  driving incidents. Daughter also has locator and can find him.   #Anemia-very mild anemia has been essentially stable.  Does not take iron.  Fecal occult blood test has been negative. Lab Results  Component Value Date   WBC 6.2 08/08/2019   HGB 11.9 (L) 08/08/2019   HCT 35.0 (L) 08/08/2019   MCV 95.2 08/08/2019   PLT 138.0 (L) 08/08/2019   Recommended follow up: No follow-ups on file.  Lab/Order associations:not fasting   ICD-10-CM   1. Essential hypertension  M73 COMPLETE METABOLIC PANEL WITH GFR    CBC with Differential/Platelet    Lipid panel  2. Gastroesophageal reflux disease without esophagitis  K21.9   3. Hyperlipidemia, unspecified hyperlipidemia type  U03.7 COMPLETE METABOLIC PANEL WITH GFR    CBC with Differential/Platelet    Lipid panel  4. Major depression in remission ([place])  F32.5   5. Preventative health care  Q96.43 COMPLETE METABOLIC PANEL WITH GFR    CBC with Differential/Platelet    Lipid panel    tamsulosin (FLOMAX) 0.4 MG CAPS capsule    Vitamin B12  6. High risk medication use  Z79.899 Vitamin B12    Meds ordered this encounter  Medications  . tamsulosin (FLOMAX) 0.4 MG CAPS capsule    Sig: TAKE ONE CAPSULE BY MOUTH AT BEDTIME    Dispense:  90 capsule    Refill:  3  . memantine (NAMENDA) 10 MG tablet    Sig: Take 1 tablet (10 mg total) by mouth daily.    Dispense:  90 tablet    Refill:  3  . rosuvastatin (CRESTOR) 20 MG tablet    Sig: TAKE 1 TABLET BY MOUTH EVERY DAY    Dispense:  90 tablet    Refill:  3    Return precautions advised.  [name], MD

## 2020-01-21 ENCOUNTER — Ambulatory Visit (INDEPENDENT_AMBULATORY_CARE_PROVIDER_SITE_OTHER): Payer: Medicare Other | Admitting: Family Medicine

## 2020-01-21 ENCOUNTER — Other Ambulatory Visit: Payer: Self-pay

## 2020-01-21 ENCOUNTER — Encounter: Payer: Self-pay | Admitting: Family Medicine

## 2020-01-21 VITALS — BP 152/78 | HR 62 | Temp 97.9°F | Ht 72.0 in | Wt 200.4 lb

## 2020-01-21 DIAGNOSIS — Z23 Encounter for immunization: Secondary | ICD-10-CM

## 2020-01-21 DIAGNOSIS — Z79899 Other long term (current) drug therapy: Secondary | ICD-10-CM

## 2020-01-21 DIAGNOSIS — E785 Hyperlipidemia, unspecified: Secondary | ICD-10-CM

## 2020-01-21 DIAGNOSIS — I10 Essential (primary) hypertension: Secondary | ICD-10-CM | POA: Diagnosis not present

## 2020-01-21 DIAGNOSIS — Z0000 Encounter for general adult medical examination without abnormal findings: Secondary | ICD-10-CM

## 2020-01-21 DIAGNOSIS — F325 Major depressive disorder, single episode, in full remission: Secondary | ICD-10-CM

## 2020-01-21 DIAGNOSIS — H3530 Unspecified macular degeneration: Secondary | ICD-10-CM | POA: Insufficient documentation

## 2020-01-21 DIAGNOSIS — K219 Gastro-esophageal reflux disease without esophagitis: Secondary | ICD-10-CM

## 2020-01-21 MED ORDER — TAMSULOSIN HCL 0.4 MG PO CAPS: ORAL_CAPSULE | ORAL | 3 refills | Status: DC

## 2020-01-21 MED ORDER — ROSUVASTATIN CALCIUM 20 MG PO TABS: ORAL_TABLET | ORAL | 3 refills | Status: DC

## 2020-01-21 MED ORDER — MEMANTINE HCL 10 MG PO TABS: 10.0000 mg | ORAL_TABLET | Freq: Every day | ORAL | 3 refills | Status: DC

## 2020-01-22 LAB — COMPLETE METABOLIC PANEL WITH GFR
AG Ratio: 1.3 (calc) (ref 1.0–2.5)
ALT: 12 U/L (ref 9–46)
AST: 14 U/L (ref 10–35)
Albumin: 3.9 g/dL (ref 3.6–5.1)
Alkaline phosphatase (APISO): 91 U/L (ref 35–144)
BUN/Creatinine Ratio: 21 (calc) (ref 6–22)
BUN: 42 mg/dL — ABNORMAL HIGH (ref 7–25)
CO2: 27 mmol/L (ref 20–32)
Calcium: 8.8 mg/dL (ref 8.6–10.3)
Chloride: 102 mmol/L (ref 98–110)
Creat: 2.02 mg/dL — ABNORMAL HIGH (ref 0.70–1.11)
GFR, Est African American: 33 mL/min/{1.73_m2} — ABNORMAL LOW (ref >=60)
GFR, Est Non African American: 28 mL/min/{1.73_m2} — ABNORMAL LOW (ref >=60)
Globulin: 2.9 g/dL (calc) (ref 1.9–3.7)
Glucose, Bld: 66 mg/dL (ref 65–99)
Potassium: 4.1 mmol/L (ref 3.5–5.3)
Sodium: 139 mmol/L (ref 135–146)
Total Bilirubin: 0.4 mg/dL (ref 0.2–1.2)
Total Protein: 6.8 g/dL (ref 6.1–8.1)

## 2020-01-22 LAB — CBC WITH DIFFERENTIAL/PLATELET
Absolute Monocytes: 820 cells/uL (ref 200–950)
Basophils Absolute: 28 cells/uL (ref 0–200)
Basophils Relative: 0.5 %
Eosinophils Absolute: 110 cells/uL (ref 15–500)
Eosinophils Relative: 2 %
HCT: 33.9 % — ABNORMAL LOW (ref 38.5–50.0)
Hemoglobin: 11.3 g/dL — ABNORMAL LOW (ref 13.2–17.1)
Lymphs Abs: 1161 cells/uL (ref 850–3900)
MCH: 32.4 pg (ref 27.0–33.0)
MCHC: 33.3 g/dL (ref 32.0–36.0)
MCV: 97.1 fL (ref 80.0–100.0)
MPV: 11.2 fL (ref 7.5–12.5)
Monocytes Relative: 14.9 %
Neutro Abs: 3383 cells/uL (ref 1500–7800)
Neutrophils Relative %: 61.5 %
Platelets: 173 10*3/uL (ref 140–400)
RBC: 3.49 10*6/uL — ABNORMAL LOW (ref 4.20–5.80)
RDW: 12.8 % (ref 11.0–15.0)
Total Lymphocyte: 21.1 %
WBC: 5.5 10*3/uL (ref 3.8–10.8)

## 2020-01-22 LAB — LIPID PANEL
Cholesterol: 119 mg/dL (ref <200)
HDL: 50 mg/dL (ref >=40)
LDL Cholesterol (Calc): 54 mg/dL (calc)
Non-HDL Cholesterol (Calc): 69 mg/dL (calc) (ref <130)
Total CHOL/HDL Ratio: 2.4 (calc) (ref <5.0)
Triglycerides: 74 mg/dL (ref <150)

## 2020-01-22 LAB — VITAMIN B12: Vitamin B-12: 489 pg/mL (ref 200–1100)

## 2020-01-28 ENCOUNTER — Other Ambulatory Visit: Payer: Self-pay | Admitting: Family Medicine

## 2020-02-18 ENCOUNTER — Telehealth: Payer: Self-pay

## 2020-02-18 DIAGNOSIS — I482 Chronic atrial fibrillation, unspecified: Secondary | ICD-10-CM

## 2020-02-18 NOTE — Telephone Encounter (Signed)
[name] is calling in asking for advice as he is having dental surgery soon and is needing to speak with a cardiologist. [name] states that his old cardiologist is no longer seeing patients, asking for advice

## 2020-02-19 NOTE — Telephone Encounter (Signed)
Can enter new referral if needed

## 2020-02-19 NOTE — Telephone Encounter (Signed)
Cardiology prescribes his eliquis- I would reach out to them and see if they can answer concerns while I am out next week- I strongly suspect the concern is about the eliquis

## 2020-02-19 NOTE — Telephone Encounter (Signed)
Ok to place new referral to cardiology or is this something you can address for the dental surgery?

## 2020-02-24 NOTE — Addendum Note (Signed)
Addended by: Clyde Lundborg A on: 02/24/2020 10:21 AM   Modules accepted: Orders

## 2020-02-24 NOTE — Telephone Encounter (Signed)
Called and spoke with [name] and referral to cardiology has been placed for Cardiology.

## 2020-02-27 ENCOUNTER — Other Ambulatory Visit: Payer: Self-pay | Admitting: Family Medicine

## 2020-03-03 ENCOUNTER — Other Ambulatory Visit: Payer: Self-pay

## 2020-03-03 MED ORDER — ESCITALOPRAM OXALATE 20 MG PO TABS: ORAL_TABLET | ORAL | 1 refills | Status: DC

## 2020-03-22 DIAGNOSIS — N1832 Chronic kidney disease, stage 3b: Secondary | ICD-10-CM | POA: Diagnosis not present

## 2020-03-24 DIAGNOSIS — N184 Chronic kidney disease, stage 4 (severe): Secondary | ICD-10-CM | POA: Diagnosis not present

## 2020-03-24 DIAGNOSIS — I129 Hypertensive chronic kidney disease with stage 1 through stage 4 chronic kidney disease, or unspecified chronic kidney disease: Secondary | ICD-10-CM | POA: Diagnosis not present

## 2020-03-24 DIAGNOSIS — D631 Anemia in chronic kidney disease: Secondary | ICD-10-CM | POA: Diagnosis not present

## 2020-03-24 DIAGNOSIS — N281 Cyst of kidney, acquired: Secondary | ICD-10-CM | POA: Diagnosis not present

## 2020-04-05 ENCOUNTER — Other Ambulatory Visit: Payer: Self-pay | Admitting: Family Medicine

## 2020-04-07 ENCOUNTER — Other Ambulatory Visit: Payer: Self-pay | Admitting: Family Medicine

## 2020-04-19 ENCOUNTER — Other Ambulatory Visit: Payer: Self-pay | Admitting: Nurse Practitioner

## 2020-07-07 ENCOUNTER — Ambulatory Visit (HOSPITAL_COMMUNITY)
Admission: RE | Admit: 2020-07-07 | Discharge: 2020-07-07 | Disposition: A | Discharge status: Home / Self Care | Payer: Medicare Other | Source: Ambulatory Visit | Attending: Nurse Practitioner | Admitting: Nurse Practitioner

## 2020-07-07 ENCOUNTER — Other Ambulatory Visit: Payer: Self-pay

## 2020-07-07 ENCOUNTER — Encounter (HOSPITAL_COMMUNITY): Payer: Self-pay | Admitting: Nurse Practitioner

## 2020-07-07 VITALS — BP 150/62 | HR 56 | Ht 72.0 in | Wt 188.6 lb

## 2020-07-07 DIAGNOSIS — I2510 Atherosclerotic heart disease of native coronary artery without angina pectoris: Secondary | ICD-10-CM | POA: Diagnosis not present

## 2020-07-07 DIAGNOSIS — I4891 Unspecified atrial fibrillation: Secondary | ICD-10-CM | POA: Diagnosis present

## 2020-07-07 DIAGNOSIS — Z87891 Personal history of nicotine dependence: Secondary | ICD-10-CM | POA: Diagnosis not present

## 2020-07-07 DIAGNOSIS — I69320 Aphasia following cerebral infarction: Secondary | ICD-10-CM | POA: Diagnosis not present

## 2020-07-07 DIAGNOSIS — Z951 Presence of aortocoronary bypass graft: Secondary | ICD-10-CM | POA: Insufficient documentation

## 2020-07-07 DIAGNOSIS — I4811 Longstanding persistent atrial fibrillation: Secondary | ICD-10-CM | POA: Insufficient documentation

## 2020-07-07 DIAGNOSIS — Z7901 Long term (current) use of anticoagulants: Secondary | ICD-10-CM | POA: Diagnosis not present

## 2020-07-07 DIAGNOSIS — D6869 Other thrombophilia: Secondary | ICD-10-CM

## 2020-07-07 DIAGNOSIS — E785 Hyperlipidemia, unspecified: Secondary | ICD-10-CM | POA: Diagnosis not present

## 2020-07-07 DIAGNOSIS — I4821 Permanent atrial fibrillation: Secondary | ICD-10-CM

## 2020-07-07 DIAGNOSIS — I10 Essential (primary) hypertension: Secondary | ICD-10-CM | POA: Insufficient documentation

## 2020-07-07 NOTE — Progress Notes (Signed)
[name], male   DOB: [birth year], 87 y.o.   MRN: [id number]       Primary Care Physician: [name], MD Primary Electrophysiologist: Dr. Rachelle Hora is a 87 y.o. male with a h/o long standing persistent atrial fibrillation who presents for f/u in the [facility]. The patient is aphasic due to previous stroke and is here with his daughter. He is off chronotropic drugs due to bradycardia. He  has a v rate of 48  bpm today. Daughter does not think he has been as active lately, but he does drive and goes to the grocery store. Does not report any lightheadedness of falls.  Taking eliquis without bleeding issues with a chadsvasc of at least 6. Continues on eliquis  to 2.5 mg bid. He has been seeing a kidney MD. Some BP meds have been adjusted by PCP lately.     He does live in  an assisted nursing facility with his wife and takes  his own meds.   F/u in afib clinic, 11/15. He remains stable with afib in the 50's. He walks  with a cane. Only one small fall at night getting out of the bed to urinate. No injuries. Has had chronic swelling of his rt lower extremity. Treated last June for cellulitis of this leg by PCP. Has had cellulitis of this leg before as well. Denies any lightheadedness or dizziness. Remains aphasic from a prior stroke. Here with daughter. Lives with wife.  F/u in afib clinic, 07/07/20. He remains  in permanent afib with CVR, no rate control drugs on board. He remains on eliquis 2.5 mg bid for CHA2DS2VASc score of 6. No bleeding issues. He has chronically swollen rt LE, Dr. Yong Channel feels prior CBAG surgery may be contributing. Several broken skin areas mon shin, does not look infected.   Today, he denies symptoms of palpitations, chest pain, shortness of breath, orthopnea, PND,  dizziness, presyncope, syncope, snoring, daytime somnolence, bleeding, or neurologic sequela..The patient is tolerating medications without difficulties  and is otherwise without complaint today.    Past Medical History:  Diagnosis Date  . Actinic keratosis   . BPH with urinary obstruction   . Bradycardia   . CAD (coronary artery disease)    CABG x5  . Cerebrovascular accident (stroke) ([place])    With right hemiparesis and persistent expressive aphasia  . GERD (gastroesophageal reflux disease)   . History of hydronephrosis   . Hx of hydronephrosis 07/08/2011   right   . Hyperlipidemia   . Hypertension   . Iron deficiency anemia due to chronic blood loss 03/26/2007   Now resolved    . Iron deficiency anemia secondary to blood loss (chronic)   . Persistent atrial fibrillation ([place])   . Pulmonary hypertension ([place])    by echo 2013  . Rosacea   . Situational depression    Severe  . 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 Tricuspid regurgitation    Past Surgical History:  Procedure Laterality Date  . ABDOMINAL SURGERY    . BACK SURGERY    . BAND HEMORRHOIDECTOMY    . CHOLECYSTECTOMY    . CORONARY ARTERY BYPASS GRAFT     x 5  . dental implants    . HERNIA REPAIR    .  KIDNEY SURGERY     stenosis with stent  . TONSILLECTOMY        Allergies  Allergen Reactions  . Ciprofloxacin Hcl     unknown  . Sulfamethoxazole-Trimethoprim     unknown    Social History   Socioeconomic History  . Marital status: Married    Spouse name: Not on file  . Number of children: Not on file  . Years of education: Not on file  . Highest education level: Not on file  Occupational History  . Not on file  Tobacco Use  . Smoking status: Former Research scientist (life sciences)  . Smokeless tobacco: Never Used  . Tobacco comment: x 87 yo per the dtr  Substance and Sexual Activity  . Alcohol use: Yes    Alcohol/week: 1.0 standard drink    Types: 1 Glasses of wine per week    Comment: glass of wine at 5  . Drug use: No   . Sexual activity: Not Currently  Other Topics Concern  . Not on file  Social History Narrative   Lives in [place] with spouse. LIves at a house at a retirement home. 3 children. 6 grandkids. 5 greatgrandkids.       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 Heart disease Father   . Heart attack Other        fhx  . Coronary artery disease Other        fhx   The patient does not have a history of early familial atrial fibrillation or other arrhythmias.  ROS- All systems are reviewed and negative except as per the HPI above.  Physical Exam: GEN- The patient is elderly  appearing, alert and oriented x 3 today, aphasic   Head- normocephalic, atraumatic Eyes-  Sclera clear, conjunctiva pink Ears- hearing intact Oropharynx- clear Neck- supple, no JVP Lymph- no cervical lymphadenopathy Lungs- Clear to ausculation bilaterally, normal work of breathing Heart- Irregular rate and rhythm, no murmurs, rubs or gallops, PMI not laterally displaced GI- soft, NT, ND, + BS Extremities- no clubbing, cyanosis, or 1+edema on rt (prior cabg)   MS- no significant deformity or atrophy Skin- no rash or lesion Psych- euthymic mood, full affect Neuro- strength and sensation are intact  Ekg today shows afib at 56 bpm    Holter monitor 02/2016-Study Highlights   Atrial fibrillation with controlled ventricular rates Average heart rate 58 bpm, max heart rate 98 bpm Nocturnal pauses of up to 3.2 seconds are noted,  No daytime pauses Occasional premature ventricular contractions with rare nonsustained ventricular tachycardia   11/2//20 monitor- Atrial Fibrillation occurred continuously (100% burden), ranging from 30- 93 bpm (avg of 53 bpm). 6 Pauses occurred, the longest lasting 3.3 secs ( at night)  (18 bpm). Isolated VEs were rare (<1.0%), VE Couplets were rare (<1.0%), and no VE Triplets were present. Ventricular Bigeminy was present. No indication for PPM   Assessment and Plan:  1. Atrial fibrillation The patient has asymptomatic longstanding persistent atrial fibrillation with slow ventricular response,  v rates in the mid 50's today Monitor placed in January 2020, did  not show indication for PPM.      2. Previous CVA with aphasia Continue Eliquis  2.5 with chadsvasc of at least 6.  3. Bradycardia  Stable No presyncopal or syncopal episodes   4.. HTN/edema Stable for pt Seeing a Kidney MD now as well as PCP is watching   F/u in 9 months    Butch Penny C. Young [place], [facility] [address] [place], [place] [address] [phone]

## 2020-07-12 ENCOUNTER — Ambulatory Visit (INDEPENDENT_AMBULATORY_CARE_PROVIDER_SITE_OTHER): Payer: Medicare Other | Admitting: Otolaryngology

## 2020-07-12 ENCOUNTER — Other Ambulatory Visit: Payer: Self-pay

## 2020-07-12 VITALS — Temp 97.2°F

## 2020-07-12 DIAGNOSIS — J0100 Acute maxillary sinusitis, unspecified: Secondary | ICD-10-CM

## 2020-07-12 NOTE — Progress Notes (Signed)
HPI: [name] is a 87 y.o. male who presents is referred by his periodontist Dr. Lattie Corns for evaluation of right maxillary sinus mass.  Apparently he had dental extractions performed because of chronic infections.  This was performed a couple months ago.  He had a CT scan performed at their office that showed opacification of the right maxillary sinus.  This was emailed to me and I was able to visualize this that showed opacification of the right maxillary sinus with a relatively clear left maxillary sinus.  There were no bony changes of the right maxillary sinus.  He is presently not on antibiotics...  Past Medical History:  Diagnosis Date  . Actinic keratosis   . BPH with urinary obstruction   . Bradycardia   . CAD (coronary artery disease)    CABG x5  . Cerebrovascular accident (stroke) ([place])    With right hemiparesis and persistent expressive aphasia  . GERD (gastroesophageal reflux disease)   . History of hydronephrosis   . Hx of hydronephrosis 07/08/2011   right   . Hyperlipidemia   . Hypertension   . Iron deficiency anemia due to chronic blood loss 03/26/2007   Now resolved    . Iron deficiency anemia secondary to blood loss (chronic)   . Persistent atrial fibrillation ([place])   . Pulmonary hypertension ([place])    by echo 2013  . Rosacea   . Situational depression    Severe  . 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 Tricuspid regurgitation    Past Surgical History:  Procedure Laterality Date  . ABDOMINAL SURGERY    . BACK SURGERY    . BAND HEMORRHOIDECTOMY    . CHOLECYSTECTOMY    . CORONARY ARTERY BYPASS GRAFT     x 5  . dental implants    . HERNIA REPAIR    . KIDNEY SURGERY     stenosis with stent  . TONSILLECTOMY     Social History   Socioeconomic History  . Marital status: Married    Spouse name: Not on file  .  Number of children: Not on file  . Years of education: Not on file  . Highest education level: Not on file  Occupational History  . Not on file  Tobacco Use  . Smoking status: Former Research scientist (life sciences)  . Smokeless tobacco: Never Used  . Tobacco comment: x 87 yo per the dtr  Substance and Sexual Activity  . Alcohol use: Yes    Alcohol/week: 1.0 standard drink    Types: 1 Glasses of wine per week    Comment: glass of wine at 7  . Drug use: No  . Sexual activity: Not Currently  Other Topics Concern  . Not on file  Social History Narrative   Lives in [place] with spouse. LIves at a house at a retirement home. 3 children. 6 grandkids. 5 greatgrandkids.       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 Heart disease  Father   . Heart attack Other        fhx  . Coronary artery disease Other        fhx   Allergies  Allergen Reactions  . Ciprofloxacin Hcl     unknown  . Sulfamethoxazole-Trimethoprim     unknown   Prior to Admission medications   Medication Sig Start Date End Date Taking? Authorizing Provider  amLODipine (NORVASC) 2.5 MG tablet TAKE 1 TABLET(2.5 MG) BY MOUTH DAILY 04/07/20   [name], MD  ELIQUIS 2.5 MG TABS tablet TAKE 1 TABLET(2.5 MG) BY MOUTH TWICE DAILY 04/19/20   Sherran Needs, NP  escitalopram (LEXAPRO) 20 MG tablet TAKE 1 TABLET BY MOUTH DAILY 03/03/20   [name], MD  furosemide (LASIX) 40 MG tablet Take 40 mg by mouth. Per [facility]    [provider]  Lansoprazole (PREVACID PO) Take 15 mg by mouth daily.    [provider]  memantine (NAMENDA) 10 MG tablet TAKE 1 TABLET BY MOUTH DAILY 03/01/20    [name], MD  Multiple Vitamin (MULTIVITAMIN) tablet Take 1 tablet by mouth daily.    [provider]  rosuvastatin (CRESTOR) 20 MG tablet TAKE 1 TABLET BY MOUTH EVERY DAY 04/05/20   [name], MD  tamsulosin (FLOMAX) 0.4 MG CAPS capsule TAKE ONE CAPSULE BY MOUTH AT BEDTIME 01/21/20   [name], MD  triamcinolone cream (KENALOG) 0.1 % Apply 1 application topically 2 (two) times daily. For 7-10 days maximum 11/06/19   [name], MD     Positive ROS: Otherwise negative  All other systems have been reviewed and were otherwise negative with the exception of those mentioned in the HPI and as above.  Physical Exam: Constitutional: Alert, well-appearing, no acute distress Ears: External ears without lesions or tenderness. Ear canals are clear bilaterally with intact, clear TMs.  Nasal: External nose without lesions. Septum relatively midline..  On nasal endoscopy the left middle meatus region is clear.  The right middle meatus region reveals a purulent yellow discharge from the middle meatus consistent with acute or chronic right maxillary sinusitis. Oral: Lips and gums without lesions. Tongue and palate mucosa without lesions. Posterior oropharynx clear. Neck: No palpable adenopathy or masses Respiratory: Breathing comfortably  Skin: No facial/neck lesions or rash noted.  Procedures  Assessment: The opacity on the CT scan is consistent with total opacification of the right maxillary sinus but there are no bony changes and this is consistent with acute or chronic right maxillary sinusitis as was seen on nasal endoscopy.  Plan: Placed him on clindamycin 300 mg 3 times daily for 2 weeks in addition to Nasacort 2 sprays each nostril at night to help relieve congestion and improve drainage of the right maxillary sinus. He will follow-up in 2 to 3 weeks for recheck.   Radene Journey, MD   CC:

## 2020-07-16 DIAGNOSIS — H353133 Nonexudative age-related macular degeneration, bilateral, advanced atrophic without subfoveal involvement: Secondary | ICD-10-CM | POA: Diagnosis not present

## 2020-07-22 NOTE — Progress Notes (Signed)
Phone [phone] In person visit   Subjective:   [name] is a 87 y.o. year old very pleasant male patient who presents for/with See problem oriented charting Chief Complaint  Patient presents with  . Hypertension   This visit occurred during the SARS-CoV-2 public health emergency.  Safety protocols were in place, including screening questions prior to the visit, additional usage of staff PPE, and extensive cleaning of exam room while observing appropriate contact time as indicated for disinfecting solutions.   Past Medical History-  Patient Active Problem List   Diagnosis Date Noted  . Memory loss 07/21/2014    Priority: High  . Chronic atrial fibrillation ([place]) 07/08/2011    Priority: High  . Hemiparesis affecting right side as late effect of cerebrovascular accident ([place]) 03/26/2007    Priority: High  . CAD (coronary artery disease) 08/23/2006    Priority: High  . Macular degeneration 01/21/2020    Priority: Medium  . Venous insufficiency 10/02/2016    Priority: Medium  . Right hydrocele 04/03/2016    Priority: Medium  . Thrombocytopenia ([place]) 11/22/2015    Priority: Medium  . CKD (chronic kidney disease), stage III ([place]) 11/22/2015    Priority: Medium  . Bradycardia 07/09/2011    Priority: Medium  . Major depression in remission ([place]) 07/26/2007    Priority: Medium  . 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    Priority: Medium  . Hyperlipidemia 08/23/2006    Priority: Medium  . Essential hypertension 08/23/2006    Priority: Medium  . Actinic keratosis 07/30/2009    Priority: Low  . GERD 08/23/2006    Priority: Low  . 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 Left wrist pain 11/01/2016  . Sensorineural hearing loss (SNHL), bilateral 07/06/2015    Medications- reviewed and updated Current Outpatient Medications  Medication Sig Dispense Refill  . amLODipine  (NORVASC) 2.5 MG tablet TAKE 1 TABLET(2.5 MG) BY MOUTH DAILY 90 tablet 3  . ELIQUIS 2.5 MG TABS tablet TAKE 1 TABLET(2.5 MG) BY MOUTH TWICE DAILY 60 tablet 6  . escitalopram (LEXAPRO) 20 MG tablet TAKE 1 TABLET BY MOUTH DAILY 90 tablet 1  . furosemide (LASIX) 40 MG tablet Take 40 mg by mouth. Per [facility]    . Lansoprazole (PREVACID PO) Take 15 mg by mouth daily.    . memantine (NAMENDA) 10 MG tablet TAKE 1 TABLET BY MOUTH DAILY 90 tablet 3  . Multiple Vitamin (MULTIVITAMIN) tablet Take 1 tablet by mouth daily.    . rosuvastatin (CRESTOR) 20 MG tablet TAKE 1 TABLET BY MOUTH EVERY DAY 90 tablet 3  . tamsulosin (FLOMAX) 0.4 MG CAPS capsule TAKE ONE CAPSULE BY MOUTH AT BEDTIME 90 capsule 3  . triamcinolone cream (KENALOG) 0.1 % Apply 1 application topically 2 (two) times daily. For 7-10 days maximum 160 g 0   No current facility-administered medications for this visit.     Objective:  BP (!) 124/58 (BP Location: Left Arm, Patient Position: Sitting, Cuff Size: Normal)   Pulse 62   Temp 97.9 F (36.6 C) (Temporal)   Ht 6' (1.829 m)   Wt 190 lb (86.2 kg)   SpO2 98%   BMI 25.77 kg/m  Gen: NAD, resting comfortably CV: RRR no murmurs rubs or gallops Lungs: CTAB no crackles, wheeze, rhonchi Ext: trace edema under compression stockings Skin: warm, dry Neuro: garbled speech, weakness on right side  Assessment and Plan    #Memory loss-patient still able to drive to grocery store in drugstore  At last visit but due to vision issues had to stop driving- car now taken away -also has had a lot of dental work -also recent sinus infection- now improving after seeing ENT -granddaughter married recently- a lot of balls to juggle for his daughter - Namenda 10 mg daily  -still able to drop off all o his tax forms  # Atrial fibrillation S: Rate controlled with no medication Anticoagulated with Eliquis 2.5 mg twice daily A/P: Stable. Continue current medications.     #CAD #hyperlipidemia S: Medication: rosuvastatin 20mg  daily.  On Eliquis alone without aspirin-aspirin was stopped July 2015 - no obvious chest pain or shortness of breath- history limited Lab Results  Component Value Date   CHOL 119 01/21/2020   HDL 50 01/21/2020   LDLCALC 54 01/21/2020   LDLDIRECT 66.0 06/01/2016   TRIG 74 01/21/2020   CHOLHDL 2.4 01/21/2020   A/P: CAD sounds to be asymptomatic. Lipids at goal- continue current meds  #Chronic kidney disease stage III or 4-follows with Dr. Carolin Sicks S: GFR is typically in the low 30s or high 20s range -Patient knows to avoid NSAIDs- acetaminophen only if needed  A/P: hopefully stable- update CMP with labs  - sparing prevacid for reflux- reasonable.   #hypertension S: medication: Amlodipine 2.5mg  daily, Lasix 40 mg per [facility]  Home readings #s: no recent checks BP Readings from Last 3 Encounters:  07/23/20 (!) 124/58  07/07/20 (!) 150/62  01/21/20 (!) 152/78  A/P: reasonable control on repeat- continue current meds  #Hemiparesis affecting right side as affected stroke- stable right-sided hemiparesis after stroke in 2003.  Likely related atrial fibrillation.  Patient continues to use cane.  Continue risk factor modification to reduce risk of another stroke  #Thrombocytopenia- mild anemia has been noted but most recently no thrombocytopenia on labs-we will recheck again today Lab Results  Component Value Date   WBC 5.5 01/21/2020   HGB 11.3 (L) 01/21/2020   HCT 33.9 (L) 01/21/2020   MCV 97.1 01/21/2020   PLT 173 01/21/2020   # Depression S: Medication:Lexapro 20 mg A/P: Difficult to truly monitor response due to garbled speech and memory issues  #BPH- difficult to assess sstatus. Continue current tamsulosin.   #he stil monitors meds and lets daughter know if runs out  # cellulitis/venous stasis insufficiency- did wear compression stockings today but not always wearing- encouraged him to use more regularly.  Had cellulitis back in January and. Dr. Carolin Sicks treated. Gets small ulcers- discussed referral to wound clinic but too many visits on family's plate right now  Recommended follow up: Return in about 6 months (around 01/23/2021) for physical or sooner if needed. Future Appointments  Date Time Provider [place]  07/28/2020 11:15 AM Rozetta Nunnery, MD [facility] None    Lab/Order associations:   ICD-10-CM   1. Essential hypertension  I10 CBC with Differential/Platelet    Comprehensive metabolic panel  2. Stage 3b chronic kidney disease (HCC)  N18.32   3. Hyperlipidemia, unspecified hyperlipidemia type  E78.5   4. Hemiparesis affecting right side as late effect of cerebrovascular accident ([place])  I69.351   5. Chronic atrial fibrillation (HCC)  I48.20   6. Coronary artery disease involving native coronary artery of native heart without angina pectoris  I25.10   7. Thrombocytopenia ([place])  D69.6   8. Major depression in remission ([place])  F32.5   9. Memory loss  R41.3     Return precautions advised.  [name], MD

## 2020-07-22 NOTE — Patient Instructions (Addendum)
Health Maintenance Due  Topic Date Due  . COVID-19 Vaccine - please send [place] the dates Never done  . Zoster Vaccines- Shingrix (1 of 2)- Please check with your pharmacy to see if they have the shingrix vaccine. If they do- please get this immunization and update [place] by phone call or mychart with dates you receive the vaccine  Never done  -also eligible for new pneumonia shot prevnar 20- consider next visit  Please stop by lab before you go If you have mychart- we will send your results within 3 business days of [place] receiving them.  If you do not have mychart- we will call you about results within 5 business days of [place] receiving them.  *please also note that you will see labs on mychart as soon as they post. I will later go in and write notes on them- will say "notes from Dr. Yong Channel"  No changes today unless labs lead [place] to make changes  Wear those compression stockings!

## 2020-07-23 ENCOUNTER — Encounter: Payer: Self-pay | Admitting: Family Medicine

## 2020-07-23 ENCOUNTER — Ambulatory Visit (INDEPENDENT_AMBULATORY_CARE_PROVIDER_SITE_OTHER): Payer: Medicare Other | Admitting: Family Medicine

## 2020-07-23 ENCOUNTER — Other Ambulatory Visit: Payer: Self-pay

## 2020-07-23 VITALS — BP 124/58 | HR 62 | Temp 97.9°F | Ht 72.0 in | Wt 190.0 lb

## 2020-07-23 DIAGNOSIS — I69351 Hemiplegia and hemiparesis following cerebral infarction affecting right dominant side: Secondary | ICD-10-CM

## 2020-07-23 DIAGNOSIS — D696 Thrombocytopenia, unspecified: Secondary | ICD-10-CM

## 2020-07-23 DIAGNOSIS — R413 Other amnesia: Secondary | ICD-10-CM

## 2020-07-23 DIAGNOSIS — E785 Hyperlipidemia, unspecified: Secondary | ICD-10-CM | POA: Diagnosis not present

## 2020-07-23 DIAGNOSIS — N1832 Chronic kidney disease, stage 3b: Secondary | ICD-10-CM | POA: Diagnosis not present

## 2020-07-23 DIAGNOSIS — I10 Essential (primary) hypertension: Secondary | ICD-10-CM

## 2020-07-23 DIAGNOSIS — F325 Major depressive disorder, single episode, in full remission: Secondary | ICD-10-CM

## 2020-07-23 DIAGNOSIS — I2510 Atherosclerotic heart disease of native coronary artery without angina pectoris: Secondary | ICD-10-CM

## 2020-07-23 DIAGNOSIS — I482 Chronic atrial fibrillation, unspecified: Secondary | ICD-10-CM

## 2020-07-23 LAB — CBC WITH DIFFERENTIAL/PLATELET
Basophils Absolute: 0 10*3/uL (ref 0.0–0.1)
Basophils Relative: 0.7 % (ref 0.0–3.0)
Eosinophils Absolute: 0.1 10*3/uL (ref 0.0–0.7)
Eosinophils Relative: 2.1 % (ref 0.0–5.0)
HCT: 31 % — ABNORMAL LOW (ref 39.0–52.0)
Hemoglobin: 10.5 g/dL — ABNORMAL LOW (ref 13.0–17.0)
Lymphocytes Relative: 19.2 % (ref 12.0–46.0)
Lymphs Abs: 1 10*3/uL (ref 0.7–4.0)
MCHC: 33.9 g/dL (ref 30.0–36.0)
MCV: 94.1 fl (ref 78.0–100.0)
Monocytes Absolute: 0.8 10*3/uL (ref 0.1–1.0)
Monocytes Relative: 17 % — ABNORMAL HIGH (ref 3.0–12.0)
Neutro Abs: 3 10*3/uL (ref 1.4–7.7)
Neutrophils Relative %: 61 % (ref 43.0–77.0)
Platelets: 142 10*3/uL — ABNORMAL LOW (ref 150.0–400.0)
RBC: 3.3 Mil/uL — ABNORMAL LOW (ref 4.22–5.81)
RDW: 15.2 % (ref 11.5–15.5)
WBC: 5 10*3/uL (ref 4.0–10.5)

## 2020-07-23 LAB — COMPREHENSIVE METABOLIC PANEL
ALT: 17 U/L (ref 0–53)
AST: 16 U/L (ref 0–37)
Albumin: 4 g/dL (ref 3.5–5.2)
Alkaline Phosphatase: 99 U/L (ref 39–117)
BUN: 41 mg/dL — ABNORMAL HIGH (ref 6–23)
CO2: 28 mEq/L (ref 19–32)
Calcium: 9.1 mg/dL (ref 8.4–10.5)
Chloride: 104 mEq/L (ref 96–112)
Creatinine, Ser: 2.05 mg/dL — ABNORMAL HIGH (ref 0.40–1.50)
GFR: 28.15 mL/min — ABNORMAL LOW (ref >60.00)
Glucose, Bld: 85 mg/dL (ref 70–99)
Potassium: 4.3 mEq/L (ref 3.5–5.1)
Sodium: 140 mEq/L (ref 135–145)
Total Bilirubin: 0.5 mg/dL (ref 0.2–1.2)
Total Protein: 7 g/dL (ref 6.0–8.3)

## 2020-07-28 ENCOUNTER — Other Ambulatory Visit: Payer: Self-pay

## 2020-07-28 ENCOUNTER — Ambulatory Visit (INDEPENDENT_AMBULATORY_CARE_PROVIDER_SITE_OTHER): Payer: Medicare Other | Admitting: Otolaryngology

## 2020-07-28 VITALS — Temp 97.3°F

## 2020-07-28 DIAGNOSIS — J0100 Acute maxillary sinusitis, unspecified: Secondary | ICD-10-CM

## 2020-07-28 NOTE — Progress Notes (Signed)
HPI: [name] is a 87 y.o. male who returns today for evaluation of right maxillary sinusitis following extraction of teeth.  He has just completed 2 weeks of clindamycin.  He is doing better.  He has difficulty communicating..  Past Medical History:  Diagnosis Date  . Actinic keratosis   . BPH with urinary obstruction   . Bradycardia   . CAD (coronary artery disease)    CABG x5  . Cerebrovascular accident (stroke) ([place])    With right hemiparesis and persistent expressive aphasia  . GERD (gastroesophageal reflux disease)   . History of hydronephrosis   . Hx of hydronephrosis 07/08/2011   right   . Hyperlipidemia   . Hypertension   . Iron deficiency anemia due to chronic blood loss 03/26/2007   Now resolved    . Iron deficiency anemia secondary to blood loss (chronic)   . Persistent atrial fibrillation ([place])   . Pulmonary hypertension ([place])    by echo 2013  . Rosacea   . Situational depression    Severe  . 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 Tricuspid regurgitation    Past Surgical History:  Procedure Laterality Date  . ABDOMINAL SURGERY    . BACK SURGERY    . BAND HEMORRHOIDECTOMY    . CHOLECYSTECTOMY    . CORONARY ARTERY BYPASS GRAFT     x 5  . dental implants    . HERNIA REPAIR    . KIDNEY SURGERY     stenosis with stent  . TONSILLECTOMY     Social History   Socioeconomic History  . Marital status: Married    Spouse name: Not on file  . Number of children: Not on file  . Years of education: Not on file  . Highest education level: Not on file  Occupational History  . Not on file  Tobacco Use  . Smoking status: Former Research scientist (life sciences)  . Smokeless tobacco: Never Used  . Tobacco comment: x 87 yo per the dtr  Substance and Sexual Activity  . Alcohol use: Yes    Alcohol/week: 1.0 standard drink    Types: 1 Glasses of  wine per week    Comment: glass of wine at 5  . Drug use: No  . Sexual activity: Not Currently  Other Topics Concern  . Not on file  Social History Narrative   Lives in [place] with spouse. LIves at a house at a retirement home. 3 children. 6 grandkids. 5 greatgrandkids.       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 Heart disease Father   . Heart attack Other        fhx  . Coronary artery disease Other        fhx   Allergies  Allergen Reactions  . Ciprofloxacin Hcl     unknown  . Sulfamethoxazole-Trimethoprim     unknown   Prior to Admission medications   Medication Sig Start Date End Date Taking?  Authorizing Provider  amLODipine (NORVASC) 2.5 MG tablet TAKE 1 TABLET(2.5 MG) BY MOUTH DAILY 04/07/20   [name], MD  ELIQUIS 2.5 MG TABS tablet TAKE 1 TABLET(2.5 MG) BY MOUTH TWICE DAILY 04/19/20   Sherran Needs, NP  escitalopram (LEXAPRO) 20 MG tablet TAKE 1 TABLET BY MOUTH DAILY 03/03/20   [name], MD  furosemide (LASIX) 40 MG tablet Take 40 mg by mouth. Per [facility]    [provider]  Lansoprazole (PREVACID PO) Take 15 mg by mouth daily.    [provider]  memantine (NAMENDA) 10 MG tablet TAKE 1 TABLET BY MOUTH DAILY 03/01/20   [name], MD  Multiple Vitamin (MULTIVITAMIN) tablet Take 1 tablet by mouth daily.    [provider]  rosuvastatin (CRESTOR) 20 MG tablet TAKE 1 TABLET BY MOUTH EVERY DAY 04/05/20   [name], MD  tamsulosin (FLOMAX) 0.4 MG CAPS capsule TAKE ONE CAPSULE BY MOUTH AT BEDTIME 01/21/20   [name], MD  triamcinolone cream (KENALOG) 0.1 % Apply 1  application topically 2 (two) times daily. For 7-10 days maximum 11/06/19   [name], MD     Positive ROS: Otherwise negative  All other systems have been reviewed and were otherwise negative with the exception of those mentioned in the HPI and as above.  Physical Exam: Constitutional: Alert, well-appearing, no acute distress Ears: External ears without lesions or tenderness. Ear canals are clear bilaterally with intact, clear TMs.  Nasal: External nose without lesions. Septum with minimal deformity.  On nasal endoscopy the right middle meatus is clear today with no obvious mucopurulent discharge coming out..  Nasopharynx is clear. Oral: Lips and gums without lesions. Tongue and palate mucosa without lesions. Posterior oropharynx clear.  The region of his dental extraction seems to be healing nicely with no evidence of infection or abscess. Neck: No palpable adenopathy or masses.  He has no swelling of the tissues over his right maxillary sinus. Respiratory: Breathing comfortably  Skin: No facial/neck lesions or rash noted.  Procedures  Assessment: Resolving right maxillary sinusitis following dental extractions.  Plan: Discussed with daughter concerning use of Flonase 2 sprays each nostril or just the right nostril for the next couple weeks in order to improve drainage of the maxillary sinus.  Do not feel like further antibiotic therapy is warranted as there is no obvious mucopurulent discharge on nasal endoscopy.   Radene Journey, MD

## 2020-08-19 ENCOUNTER — Other Ambulatory Visit: Payer: Self-pay

## 2020-08-19 DIAGNOSIS — D649 Anemia, unspecified: Secondary | ICD-10-CM

## 2020-09-06 ENCOUNTER — Other Ambulatory Visit: Payer: Self-pay

## 2020-09-06 ENCOUNTER — Other Ambulatory Visit (INDEPENDENT_AMBULATORY_CARE_PROVIDER_SITE_OTHER): Payer: Medicare Other

## 2020-09-06 DIAGNOSIS — D649 Anemia, unspecified: Secondary | ICD-10-CM | POA: Diagnosis not present

## 2020-09-06 LAB — CBC WITH DIFFERENTIAL/PLATELET
Basophils Absolute: 0 10*3/uL (ref 0.0–0.1)
Basophils Relative: 0.8 % (ref 0.0–3.0)
Eosinophils Absolute: 0.1 10*3/uL (ref 0.0–0.7)
Eosinophils Relative: 2.1 % (ref 0.0–5.0)
HCT: 29.6 % — ABNORMAL LOW (ref 39.0–52.0)
Hemoglobin: 10.1 g/dL — ABNORMAL LOW (ref 13.0–17.0)
Lymphocytes Relative: 19.3 % (ref 12.0–46.0)
Lymphs Abs: 1 10*3/uL (ref 0.7–4.0)
MCHC: 34.1 g/dL (ref 30.0–36.0)
MCV: 94.1 fl (ref 78.0–100.0)
Monocytes Absolute: 0.6 10*3/uL (ref 0.1–1.0)
Monocytes Relative: 11.9 % (ref 3.0–12.0)
Neutro Abs: 3.4 10*3/uL (ref 1.4–7.7)
Neutrophils Relative %: 65.9 % (ref 43.0–77.0)
Platelets: 155 10*3/uL (ref 150.0–400.0)
RBC: 3.14 Mil/uL — ABNORMAL LOW (ref 4.22–5.81)
RDW: 15.1 % (ref 11.5–15.5)
WBC: 5.2 10*3/uL (ref 4.0–10.5)

## 2020-09-30 ENCOUNTER — Other Ambulatory Visit: Payer: Self-pay | Admitting: Family Medicine

## 2020-10-04 ENCOUNTER — Telehealth: Payer: Self-pay

## 2020-10-04 NOTE — Telephone Encounter (Signed)
Patient's wife called in stating [name] was diagnosed with Lipodermatosclerosis. She said that she wants to know what the best treatment options are, offered an appointment but declined. Just worried about the patient.

## 2020-10-04 NOTE — Telephone Encounter (Signed)
Daughter, [name] called stating that Dr. Yong Channel is aware of wound conditions, as it has been going on for several years.    States Dr. Yong Channel has seen wounds and knows what diagnosis may be.    Daughter states she does not believe that the wounds are infected right now.  She states they are bleeding and patient has anemia.   Daughter does not want to take patient to ED unless Dr. Yong Channel advises.   Would like to know if Dr. Yong Channel could refer patient to wound care based off of previous OV notes?    Daughter can be reached at [phone].

## 2020-10-04 NOTE — Telephone Encounter (Signed)
Nurse Assessment Nurse: Valentino Nose, RN, [place] Date/Time Eilene Ghazi Time): 10/04/2020 11:04:28 AM Confirm and document reason for call. If symptomatic, describe symptoms. ---Caller states her husband has blood built up in his leg that punches a hole and makes the blood come out. It is his right leg. It is not currently bleeding. Does the patient have any new or worsening symptoms? ---Yes Will a triage be completed? ---Yes Related visit to physician within the last 2 weeks? ---No Does the PT have any chronic conditions? (i.e. diabetes, asthma, this includes High risk factors for pregnancy, etc.) ---Yes Is this a behavioral health or substance abuse call? ---No Disp. Time Eilene Ghazi Time) Disposition Final User 10/04/2020 11:10:22 AM Clinical Call Yes Valentino Nose, RN, Marye Round PLEASE NOTE: All timestamps contained within this report are represented as [place] Standard Time. CONFIDENTIALTY NOTICE: This fax transmission is intended only for the addressee. It contains information that is legally privileged, confidential or otherwise protected from use or disclosure. If you are not the intended recipient, you are strictly prohibited from reviewing, disclosing, copying using or disseminating any of this information or taking any action in reliance on or regarding this information. If you have received this fax in error, please notify us immediately by telephone so that we can arrange for its return to [place]. Phone: [phone], Toll-Free: [phone], Fax: [phone] Page: 2 of 2 Call Id: [id number] Comments User: Laverna Peace, RN Date/Time Eilene Ghazi Time): 10/04/2020 11:10:02 AM Caller is not with her husband at the moment and does not know how to explain or tell what is going on with the pt at this time. Caller states she will go find her husband and call back

## 2020-10-05 ENCOUNTER — Other Ambulatory Visit: Payer: Self-pay

## 2020-10-05 DIAGNOSIS — I878 Other specified disorders of veins: Secondary | ICD-10-CM

## 2020-10-05 NOTE — Telephone Encounter (Signed)
Please advise 

## 2020-10-05 NOTE — Telephone Encounter (Signed)
May refer to wound care with venous stasis insufficiency with ulcerations as the diagnosis-may place as urgent-we have previously discussed but if worsening I agree with getting him in  If it takes a long time to get in-I have had some success with Dr. Sharol Given of [facility] helping with leg wounds

## 2020-10-05 NOTE — Telephone Encounter (Signed)
Referral has been placed. I did speak with patients daughter in regards to dr Ronney Lion comments. Gave a verbal understanding.

## 2020-10-19 IMAGING — US US RENAL
1 series · 13 of 25 positions shown · non-contrast
Comparison: Abdomen ultrasound 07/09/2011. CT Abdomen and Pelvis
03/28/2005

CLINICAL DATA: 89-year-old male with subacute kidney injury.

EXAM:
RENAL / URINARY TRACT ULTRASOUND COMPLETE

[Series 1: us renal · 13 of 77 slices shown]
[im 1/77]
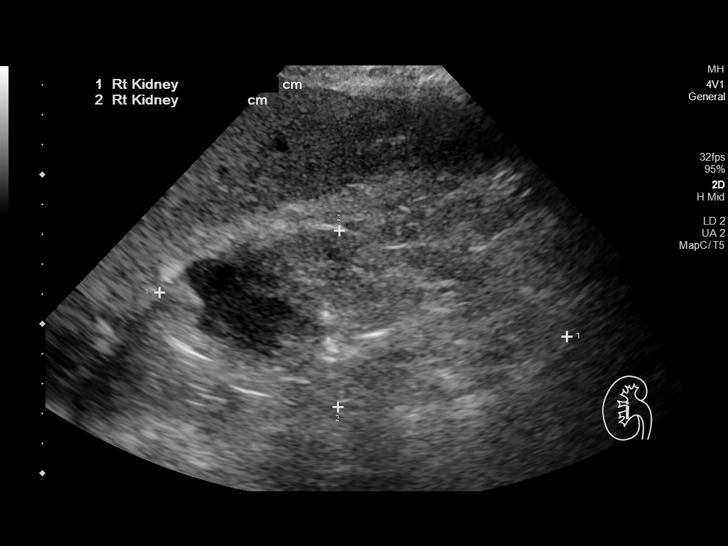
[im 7/77]
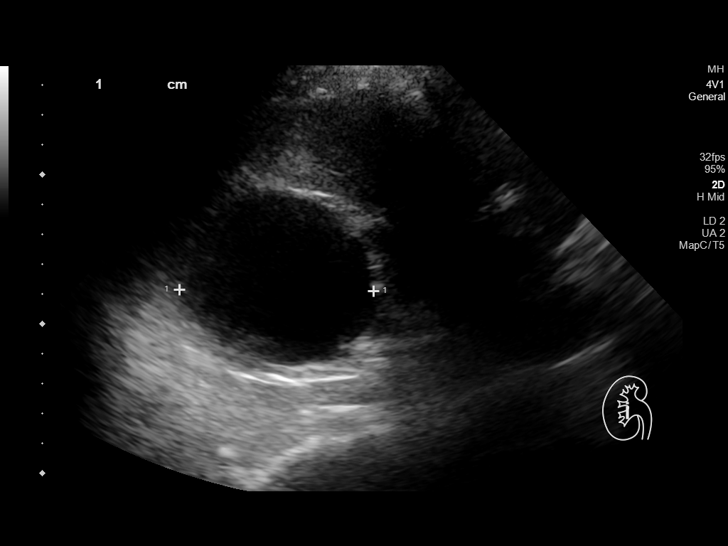
[im 13/77]
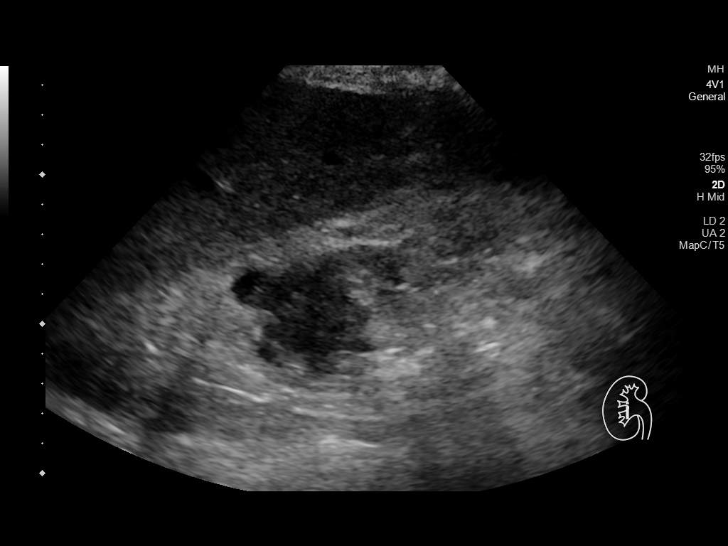
[im 20/77]
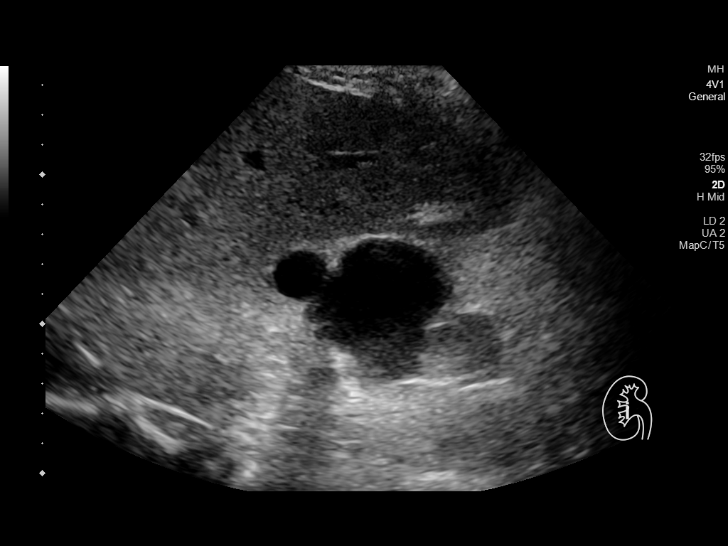
[im 26/77]
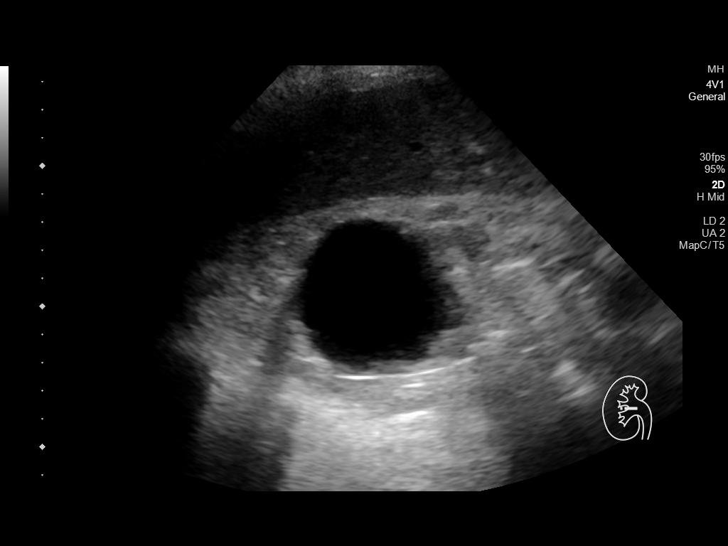
[im 32/77]
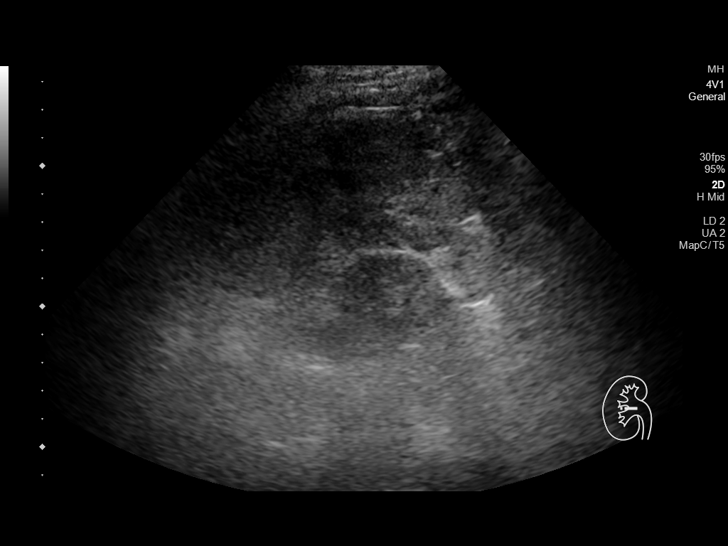
[im 39/77]
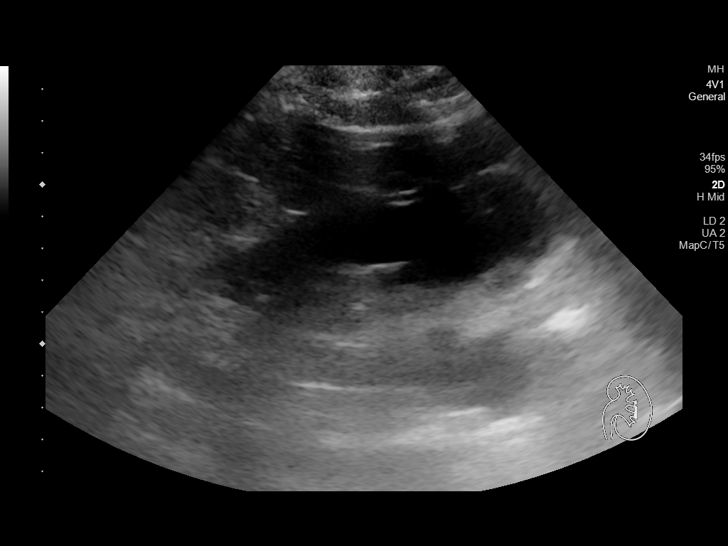
[im 45/77]
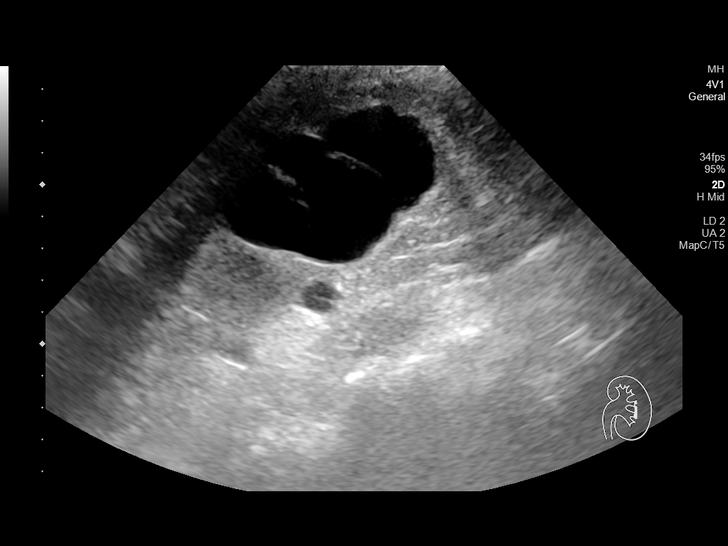
[im 51/77]
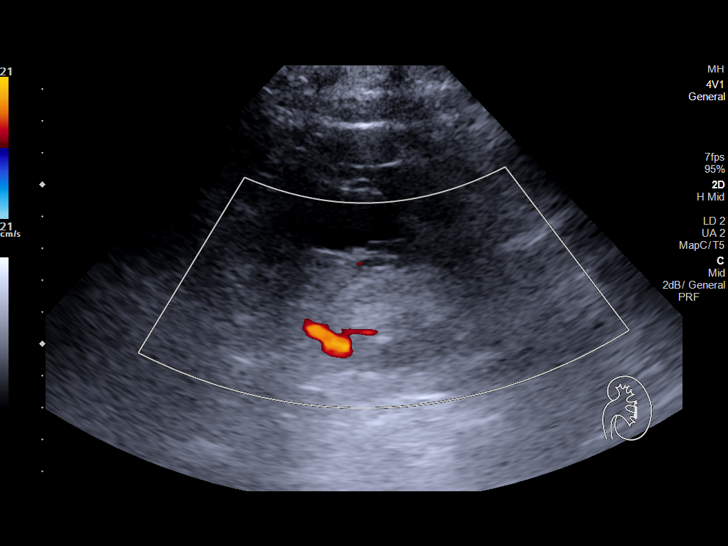
[im 58/77]
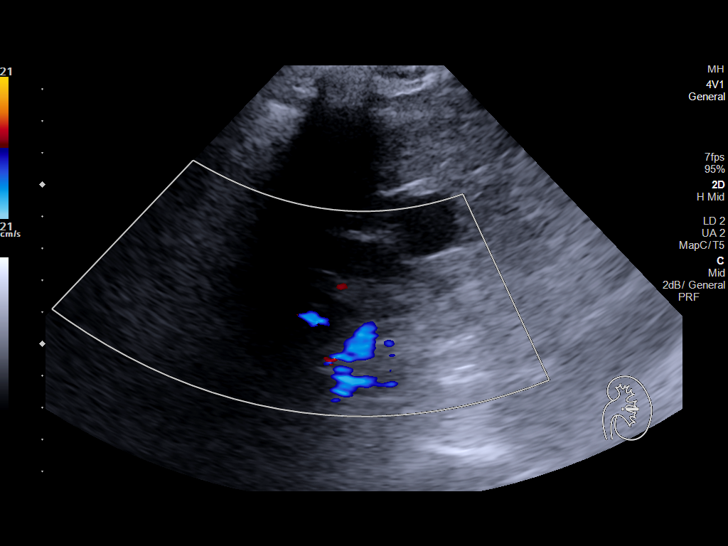
[im 64/77]
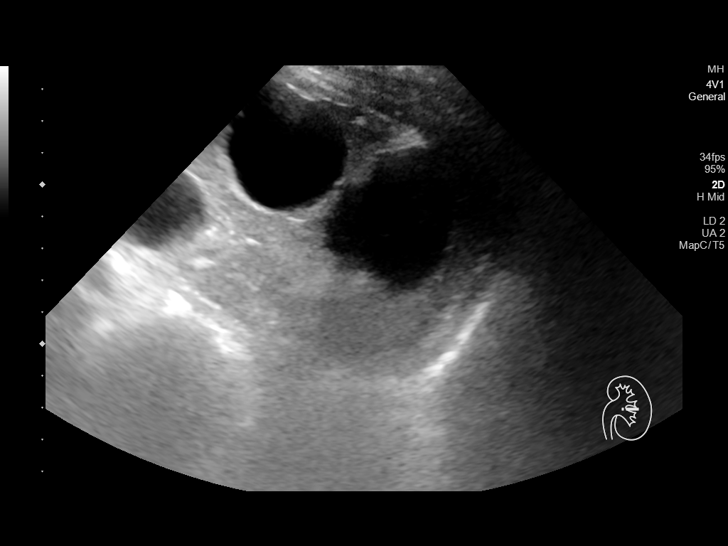
[im 70/77]
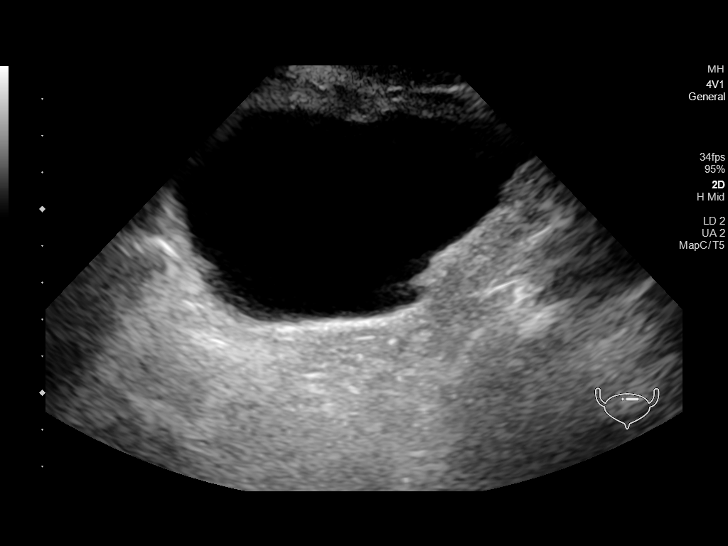
[im 77/77]
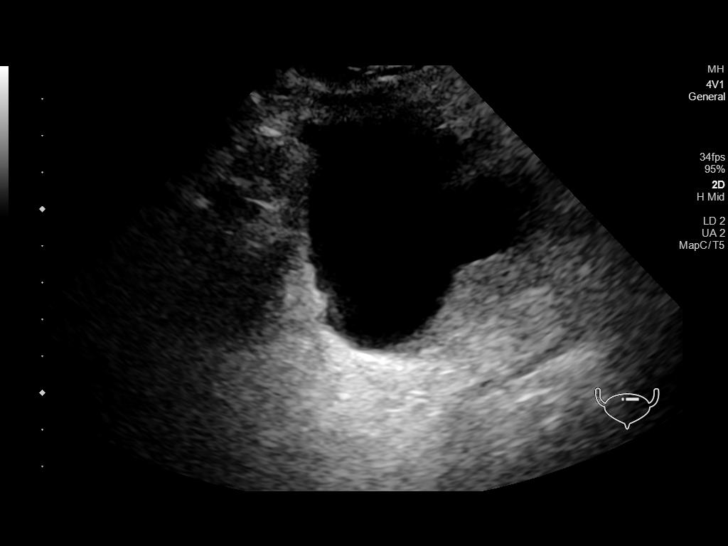

[13 of 25 positions shown; findings below may reference images not displayed]

FINDINGS: Right Kidney:

Difficult to visualize due to increased cortical echogenicity (image
2). Estimated renal measurements: 13.7 x 5.9 x 5.8 centimeters =
volume: 245 mL. No hydronephrosis. Atrophied and echogenic right
renal cortex with superimposed chronic polycystic disease. Multiple
chronic relatively large but simple and benign appearing right renal
cysts are redemonstrated measuring up to 7.3 centimeters diameter.
Fewer than 10 cysts overall are suspected, approximately 7 right
renal cysts were demonstrated on the 8441 CT.

Left Kidney:

Renal measurements: 11.3 x 6.0 x 5.8 centimeters = volume: 205 mL.
No hydronephrosis. Echogenic left renal cortex (image 33), although
cortical thickness is better maintained than on the right side.
Similar to the right side, chronic polycystic disease on the left.
Multiple relatively large cysts, most of which also appear simple
and benign. The largest is 9.8 centimeters. There is a septated
centimeter cyst with irregular margins on image 42, although no
convincing vascular elements on Doppler (image 44).

Bladder:

Irregular bladder contour (image 66), but no bladder wall
thickening. Both ureteral jets are detected with Doppler.

Other:

None.
IMPRESSION: 1. No acute renal findings.
2. Chronic medical renal disease, with right greater than left renal
cortical atrophy.
3. Chronic polycystic renal disease. No solid or suspicious renal
mass identified.

## 2020-11-08 ENCOUNTER — Other Ambulatory Visit: Payer: Self-pay

## 2020-11-08 ENCOUNTER — Other Ambulatory Visit (INDEPENDENT_AMBULATORY_CARE_PROVIDER_SITE_OTHER): Payer: Medicare Other

## 2020-11-08 DIAGNOSIS — N1832 Chronic kidney disease, stage 3b: Secondary | ICD-10-CM | POA: Diagnosis not present

## 2020-11-08 DIAGNOSIS — D649 Anemia, unspecified: Secondary | ICD-10-CM | POA: Diagnosis not present

## 2020-11-08 LAB — CBC
HCT: 29.2 % — ABNORMAL LOW (ref 39.0–52.0)
Hemoglobin: 9.8 g/dL — ABNORMAL LOW (ref 13.0–17.0)
MCHC: 33.6 g/dL (ref 30.0–36.0)
MCV: 95.4 fl (ref 78.0–100.0)
Platelets: 138 10*3/uL — ABNORMAL LOW (ref 150.0–400.0)
RBC: 3.06 Mil/uL — ABNORMAL LOW (ref 4.22–5.81)
RDW: 14.9 % (ref 11.5–15.5)
WBC: 4.5 10*3/uL (ref 4.0–10.5)

## 2020-11-08 NOTE — Addendum Note (Signed)
Addended by: Loura Back on: 11/08/2020 03:07 PM   Modules accepted: Orders

## 2020-11-09 LAB — RENAL FUNCTION PANEL
Albumin: 4 g/dL (ref 3.5–5.2)
BUN: 52 mg/dL — ABNORMAL HIGH (ref 6–23)
CO2: 28 mEq/L (ref 19–32)
Calcium: 8.9 mg/dL (ref 8.4–10.5)
Chloride: 106 mEq/L (ref 96–112)
Creatinine, Ser: 2.39 mg/dL — ABNORMAL HIGH (ref 0.40–1.50)
GFR: 23.37 mL/min — ABNORMAL LOW (ref >60.00)
Glucose, Bld: 87 mg/dL (ref 70–99)
Phosphorus: 4.3 mg/dL (ref 2.3–4.6)
Potassium: 4.4 mEq/L (ref 3.5–5.1)
Sodium: 144 mEq/L (ref 135–145)

## 2020-11-10 ENCOUNTER — Other Ambulatory Visit: Payer: Self-pay

## 2020-11-10 ENCOUNTER — Encounter (HOSPITAL_BASED_OUTPATIENT_CLINIC_OR_DEPARTMENT_OTHER): Payer: Medicare Other | Attending: Physician Assistant | Admitting: Physician Assistant

## 2020-11-10 DIAGNOSIS — I2510 Atherosclerotic heart disease of native coronary artery without angina pectoris: Secondary | ICD-10-CM | POA: Diagnosis not present

## 2020-11-10 DIAGNOSIS — I69351 Hemiplegia and hemiparesis following cerebral infarction affecting right dominant side: Secondary | ICD-10-CM | POA: Insufficient documentation

## 2020-11-10 DIAGNOSIS — I10 Essential (primary) hypertension: Secondary | ICD-10-CM | POA: Diagnosis not present

## 2020-11-10 DIAGNOSIS — L97822 Non-pressure chronic ulcer of other part of left lower leg with fat layer exposed: Secondary | ICD-10-CM | POA: Diagnosis not present

## 2020-11-10 DIAGNOSIS — Z951 Presence of aortocoronary bypass graft: Secondary | ICD-10-CM | POA: Insufficient documentation

## 2020-11-10 DIAGNOSIS — Z7901 Long term (current) use of anticoagulants: Secondary | ICD-10-CM | POA: Diagnosis not present

## 2020-11-10 DIAGNOSIS — I480 Paroxysmal atrial fibrillation: Secondary | ICD-10-CM | POA: Insufficient documentation

## 2020-11-10 DIAGNOSIS — I890 Lymphedema, not elsewhere classified: Secondary | ICD-10-CM | POA: Insufficient documentation

## 2020-11-10 DIAGNOSIS — I872 Venous insufficiency (chronic) (peripheral): Secondary | ICD-10-CM | POA: Diagnosis not present

## 2020-11-10 DIAGNOSIS — L97812 Non-pressure chronic ulcer of other part of right lower leg with fat layer exposed: Secondary | ICD-10-CM | POA: Insufficient documentation

## 2020-11-10 DIAGNOSIS — D649 Anemia, unspecified: Secondary | ICD-10-CM

## 2020-11-10 NOTE — Progress Notes (Signed)
[name], [name] ([id number]) Visit Report for 11/10/2020 Allergy List Details Patient Name: Date of Service: [name], [name] 11/10/2020 9:00 A M Medical Record Number: [id number] Patient Account Number: [id number] Date of Birth/Sex: Treating RN: Dec 25, 1928 (87 y.o. [name] Primary Care [name]: [name] Other Clinician: Referring [name]: Treating [name]/Extender: [name] in Treatment: 0 Allergies Active Allergies Cipro Reaction: unsure Sulfa (Sulfonamide Antibiotics) Reaction: unsure Allergy Notes Electronic Signature(s) Signed: 11/10/2020 6:11:22 PM By: Deon Pilling RN, BSN Entered By: Deon Pilling on 11/10/2020 09:25:50 -------------------------------------------------------------------------------- Arrival Information Details Patient Name: Date of Service: [name] [name] V. 11/10/2020 9:00 A M Medical Record Number: [id number] Patient Account Number: [id number] Date of Birth/Sex: Treating RN: August 02, 1928 (87 y.o. [name] Primary Care [name]: [name] Other Clinician: Referring Hartley Wyke: Treating Ezana Hubbert/Extender: [name] in Treatment: 0 Visit Information Patient Arrived: [name] Time: 09:15 Accompanied By: daughter Transfer Assistance: None Patient Identification Verified: Yes Secondary Verification Process Completed: Yes Patient Requires Transmission-Based Precautions: No Patient Has Alerts: Yes Patient Alerts: Patient on Blood Thinner Electronic Signature(s) Signed: 11/10/2020 6:11:22 PM By: Deon Pilling RN, BSN Entered By: Deon Pilling on 11/10/2020 09:23:36 -------------------------------------------------------------------------------- Clinic Level of Care Assessment Details Patient Name: Date of Service: [name] 11/10/2020 9:00 A M Medical Record Number: [id number] Patient Account Number: [id number] Date of Birth/Sex: Treating RN: [birth year] (87  y.o. [name] Primary Care [name]: [name] Other Clinician: Referring Celedonio Sortino: Treating Raechelle Sarti/Extender: [name] in Treatment: 0 Clinic Level of Care Assessment Items TOOL 1 Quantity Score []  - 0 Use when EandM and Procedure is performed on INITIAL visit ASSESSMENTS - Nursing Assessment / Reassessment X- 1 20 General Physical Exam (combine w/ comprehensive assessment (listed just below) when performed on new pt. evals) X- 1 25 Comprehensive Assessment (HX, ROS, Risk Assessments, Wounds Hx, etc.) ASSESSMENTS - Wound and Skin Assessment / Reassessment []  - 0 Dermatologic / Skin Assessment (not related to wound area) ASSESSMENTS - Ostomy and/or Continence Assessment and Care []  - 0 Incontinence Assessment and Management []  - 0 Ostomy Care Assessment and Management (repouching, etc.) PROCESS - Coordination of Care X - Simple Patient / Family Education for ongoing care 1 15 []  - 0 Complex (extensive) Patient / Family Education for ongoing care X- 1 10 Staff obtains Programmer, systems, Records, T Results / Process Orders est []  - 0 Staff telephones HHA, Nursing Homes / Clarify orders / etc []  - 0 Routine Transfer to another Facility (non-emergent condition) []  - 0 Routine Hospital Admission (non-emergent condition) X- 1 15 New Admissions / Biomedical engineer / Ordering NPWT Apligraf, etc. , []  - 0 Emergency Hospital Admission (emergent condition) PROCESS - Special Needs []  - 0 Pediatric / Minor Patient Management []  - 0 Isolation Patient Management []  - 0 Hearing / Language / Visual special needs []  - 0 Assessment of Community assistance (transportation, D/C planning, etc.) []  - 0 Additional assistance / Altered mentation []  - 0 Support Surface(s) Assessment (bed, cushion, seat, etc.) INTERVENTIONS - Miscellaneous []  - 0 External ear exam []  - 0 Patient Transfer (multiple staff / Civil Service fast streamer / Similar devices) []  -  0 Simple Staple / Suture removal (25 or less) []  - 0 Complex Staple / Suture removal (26 or more) []  - 0 Hypo/Hyperglycemic Management (do not check if billed separately) X- 1 15 Ankle / Brachial Index (ABI) - do not check if billed separately Has the patient been seen at  the hospital within the last three years: Yes Total Score: 100 Level Of Care: New/Established - Level 3 Electronic Signature(s) Signed: 11/10/2020 6:35:12 PM By: [name] RN, BSN Signed: 11/10/2020 6:35:12 PM By: [name] RN, BSN Entered By: [name] on 11/10/2020 10:39:40 -------------------------------------------------------------------------------- Compression Therapy Details Patient Name: Date of Service: [name] [name] V. 11/10/2020 9:00 A M Medical Record Number: [id number] Patient Account Number: [id number] Date of Birth/Sex: Treating RN: 29-Mar-1928 (87 y.o. [name] Primary Care [name]: [name] Other Clinician: Referring Analeya Luallen: Treating Lawayne Hartig/Extender: [name] in Treatment: 0 Compression Therapy Performed for Wound Assessment: Wound #1 Right,Anterior Lower Leg Performed By: Clinician [name], RN Compression Type: Three Layer Post Procedure Diagnosis Same as Pre-procedure Electronic Signature(s) Signed: 11/10/2020 6:35:12 PM By: [name] RN, BSN Entered By: [name] on 11/10/2020 10:40:23 -------------------------------------------------------------------------------- Compression Therapy Details Patient Name: Date of Service: [name] [name] V. 11/10/2020 9:00 A M Medical Record Number: [id number] Patient Account Number: [id number] Date of Birth/Sex: Treating RN: [birth year] (87 y.o. [name] Primary Care [name]: [name] Other Clinician: Referring [name]: Treating [name]/Extender: [name] in Treatment: 0 Compression Therapy Performed for Wound  Assessment: Wound #2 Right,Medial Lower Leg Performed By: Clinician [name], RN Compression Type: Three Layer Post Procedure Diagnosis Same as Pre-procedure Electronic Signature(s) Signed: 11/10/2020 6:35:12 PM By: [name] RN, BSN Entered By: [name] on 11/10/2020 10:40:23 -------------------------------------------------------------------------------- Encounter Discharge Information Details Patient Name: Date of Service: [name] [name] V. 11/10/2020 9:00 A M Medical Record Number: [id number] Patient Account Number: [id number] Date of Birth/Sex: Treating RN: 04/26/1928 (87 y.o. [name] Primary Care Braidyn Peace: [name] Other Clinician: Referring Der Gagliano: Treating Shamirah Ivan/Extender: [name] in Treatment: 0 Encounter Discharge Information Items Discharge Condition: Stable Ambulatory Status: Cane Discharge Destination: Home Transportation: Private Auto Accompanied By: daughter Schedule Follow-up Appointment: Yes Clinical Summary of Care: Patient Declined Electronic Signature(s) Signed: 11/10/2020 6:35:12 PM By: [name] RN, BSN Entered By: [name] on 11/10/2020 18:16:39 -------------------------------------------------------------------------------- Lower Extremity Assessment Details Patient Name: Date of Service: [name] [name] V. 11/10/2020 9:00 A M Medical Record Number: [id number] Patient Account Number: [id number] Date of Birth/Sex: Treating RN: Dec 26, 1928 (87 y.o. [name] Primary Care Camil Hausmann: [name] Other Clinician: Referring Ayo Guarino: Treating Morrisa Aldaba/Extender: [name] in Treatment: 0 Edema Assessment Assessed: Shirlyn Goltz: No] [name]: Yes] Edema: [Left: Ye] [Right: s] Calf Left: Right: Point of Measurement: 35 cm From Medial Instep 38 cm Ankle Left: Right: Point of Measurement: 12 cm From Medial Instep 25 cm Knee To Floor Left:  Right: From Medial Instep 46 cm Vascular Assessment Pulses: Dorsalis Pedis Palpable: [Right:Yes] Blood Pressure: Brachial: [Right:152] Ankle: [Right:Dorsalis Pedis: 190 1.25] Electronic Signature(s) Signed: 11/10/2020 6:11:22 PM By: Deon Pilling RN, BSN Entered By: Deon Pilling on 11/10/2020 09:51:32 -------------------------------------------------------------------------------- Multi-Disciplinary Care Plan Details Patient Name: Date of Service: [name] [name] V. 11/10/2020 9:00 A M Medical Record Number: [id number] Patient Account Number: [id number] Date of Birth/Sex: Treating RN: Apr 18, 1928 (87 y.o. [name] Primary Care Lane Eland: [name] Other Clinician: Referring Shereka Lafortune: Treating Quincie Haroon/Extender: [name] in Treatment: 0 Multidisciplinary Care Plan reviewed with physician Active Inactive Abuse / Safety / Falls / Self Care Management Nursing Diagnoses: Potential for falls Goals: Patient/caregiver will verbalize/demonstrate measures taken to prevent injury and/or falls Date Initiated: 11/10/2020 Target Resolution Date: 12/08/2020 Goal Status: Active Interventions: Assess fall risk on admission and as  needed Assess impairment of mobility on admission and as needed per policy Notes: Venous Leg Ulcer Nursing Diagnoses: Knowledge deficit related to disease process and management Potential for venous Insuffiency (use before diagnosis confirmed) Goals: Patient will maintain optimal edema control Date Initiated: 11/10/2020 Target Resolution Date: 12/08/2020 Goal Status: Active Interventions: Assess peripheral edema status every visit. Compression as ordered Provide education on venous insufficiency Treatment Activities: Therapeutic compression applied : 11/10/2020 Notes: Wound/Skin Impairment Nursing Diagnoses: Impaired tissue integrity Knowledge deficit related to ulceration/compromised skin  integrity Goals: Patient/caregiver will verbalize understanding of skin care regimen Date Initiated: 11/10/2020 Target Resolution Date: 12/08/2020 Goal Status: Active Ulcer/skin breakdown will have a volume reduction of 30% by week 4 Date Initiated: 11/10/2020 Target Resolution Date: 12/08/2020 Goal Status: Active Interventions: Assess patient/caregiver ability to obtain necessary supplies Assess patient/caregiver ability to perform ulcer/skin care regimen upon admission and as needed Assess ulceration(s) every visit Provide education on ulcer and skin care Treatment Activities: Skin care regimen initiated : 11/10/2020 Topical wound management initiated : 11/10/2020 Notes: Electronic Signature(s) Signed: 11/10/2020 6:35:12 PM By: [name] RN, BSN Signed: 11/10/2020 6:35:12 PM By: [name] RN, BSN Entered By: [name] on 11/10/2020 10:38:32 -------------------------------------------------------------------------------- Pain Assessment Details Patient Name: Date of Service: [name] [name] V. 11/10/2020 9:00 A M Medical Record Number: [id number] Patient Account Number: [id number] Date of Birth/Sex: Treating RN: 06-Jan-1929 (87 y.o. [name] Primary Care Iyannah Blake: [name] Other Clinician: Referring Roshanna Cimino: Treating Kiron Osmun/Extender: [name] in Treatment: 0 Active Problems Location of Pain Severity and Description of Pain Patient Has Paino No Site Locations Rate the pain. Current Pain Level: 0 Pain Management and Medication Current Pain Management: Medication: No Cold Application: No Rest: No Massage: No Activity: No T.E.N.S.: No Heat Application: No Leg drop or elevation: No Is the Current Pain Management Adequate: Adequate How does your wound impact your activities of daily livingo Sleep: No Bathing: No Appetite: No Relationship With Others: No Bladder Continence: No Emotions: No Bowel Continence:  No Work: No Toileting: No Drive: No Dressing: No Hobbies: No Engineer, maintenance) Signed: 11/10/2020 6:11:22 PM By: Deon Pilling RN, BSN Entered By: Deon Pilling on 11/10/2020 09:42:37 -------------------------------------------------------------------------------- Patient/Caregiver Education Details Patient Name: Date of Service: [place], [facility] [name] V. 9/14/2022andnbsp9:00 A M Medical Record Number: [id number] Patient Account Number: [id number] Date of Birth/Gender: Treating RN: [birth year] (87 y.o. [name] Primary Care Physician: [name] Other Clinician: Referring Physician: Treating Physician/Extender: [name] in Treatment: 0 Education Assessment Education Provided To: Patient Education Topics Provided Venous: Methods: Explain/Verbal Responses: Reinforcements needed, State content correctly Wound/Skin Impairment: Methods: Explain/Verbal Responses: Reinforcements needed, State content correctly Electronic Signature(s) Signed: 11/10/2020 6:35:12 PM By: [name] RN, BSN Entered By: [name] on 11/10/2020 10:38:48 -------------------------------------------------------------------------------- Wound Assessment Details Patient Name: Date of Service: [name] [name] V. 11/10/2020 9:00 A M Medical Record Number: [id number] Patient Account Number: [id number] Date of Birth/Sex: Treating RN: 14-Jul-1928 (87 y.o. [name] Primary Care Grason Brailsford: [name] Other Clinician: Referring Mayer Vondrak: Treating Yazlynn Birkeland/Extender: [name] in Treatment: 0 Wound Status Wound Number: 1 Primary Lymphedema Etiology: Wound Location: Right, Anterior Lower Leg Wound Open Wounding Event: Gradually Appeared Status: Date Acquired: 11/10/2020 Comorbid Arrhythmia, Coronary Artery Disease, Hypertension, End Weeks Of Treatment: 0 History: Stage Renal Disease Clustered Wound: No Photos Wound  Measurements Length: (cm) 1.8 Width: (cm) 1.8 Depth: (cm) 0.1 Area: (cm) 2.545 Volume: (cm) 0.254 % Reduction in Area: 0% % Reduction  in Volume: 0% Epithelialization: None Tunneling: No Undermining: No Wound Description Classification: Full Thickness Without Exposed Support Structures Wound Margin: Distinct, outline attached Exudate Amount: Medium Exudate Type: Serosanguineous Exudate Color: red, brown Foul Odor After Cleansing: No Slough/Fibrino Yes Wound Bed Granulation Amount: Large (67-100%) Exposed Structure Granulation Quality: Pink Fascia Exposed: No Necrotic Amount: Small (1-33%) Fat Layer (Subcutaneous Tissue) Exposed: Yes Necrotic Quality: Adherent Slough Tendon Exposed: No Muscle Exposed: No Joint Exposed: No Bone Exposed: No Treatment Notes Wound #1 (Lower Leg) Wound Laterality: Right, Anterior Cleanser Peri-Wound Care Sween Lotion (Moisturizing lotion) Discharge Instruction: Apply moisturizing lotion as directed Topical Primary Dressing KerraCel Ag Gelling Fiber Dressing, 2x2 in (silver alginate) Discharge Instruction: Apply silver alginate to wound bed as instructed Secondary Dressing Woven Gauze Sponge, Non-Sterile 4x4 in Discharge Instruction: Apply over primary dressing as directed. Secured With Compression Wrap ThreePress (3 layer compression wrap) Discharge Instruction: Apply three layer compression as directed. Compression Stockings Add-Ons Electronic Signature(s) Signed: 11/10/2020 6:11:22 PM By: Deon Pilling RN, BSN Entered By: Deon Pilling on 11/10/2020 09:47:04 -------------------------------------------------------------------------------- Wound Assessment Details Patient Name: Date of Service: [name] [name] V. 11/10/2020 9:00 A M Medical Record Number: [id number] Patient Account Number: [id number] Date of Birth/Sex: Treating RN: Jun 04, 1928 (87 y.o. Lorette Ang, Meta.Reding Primary Care Keymiah Lyles: [name] Other  Clinician: Referring Dao Memmott: Treating Gwyneth Fernandez/Extender: [name] in Treatment: 0 Wound Status Wound Number: 2 Primary Lymphedema Etiology: Wound Location: Right, Medial Lower Leg Wound Open Wounding Event: Gradually Appeared Status: Date Acquired: 11/10/2020 Comorbid Arrhythmia, Coronary Artery Disease, Hypertension, End Weeks Of Treatment: 0 History: Stage Renal Disease Clustered Wound: No Photos Wound Measurements Length: (cm) 1 Width: (cm) 0.4 Depth: (cm) 0.1 Area: (cm) 0.314 Volume: (cm) 0.031 % Reduction in Area: 0% % Reduction in Volume: 0% Epithelialization: None Tunneling: No Undermining: No Wound Description Classification: Full Thickness Without Exposed Support Structu Wound Margin: Distinct, outline attached Exudate Amount: Medium Exudate Type: Serosanguineous Exudate Color: red, brown res Foul Odor After Cleansing: No Slough/Fibrino No Wound Bed Granulation Amount: Large (67-100%) Exposed Structure Granulation Quality: Red Fascia Exposed: No Necrotic Amount: None Present (0%) Fat Layer (Subcutaneous Tissue) Exposed: Yes Tendon Exposed: No Muscle Exposed: No Joint Exposed: No Bone Exposed: No Treatment Notes Wound #2 (Lower Leg) Wound Laterality: Right, Medial Cleanser Peri-Wound Care Sween Lotion (Moisturizing lotion) Discharge Instruction: Apply moisturizing lotion as directed Topical Primary Dressing KerraCel Ag Gelling Fiber Dressing, 2x2 in (silver alginate) Discharge Instruction: Apply silver alginate to wound bed as instructed Secondary Dressing Woven Gauze Sponge, Non-Sterile 4x4 in Discharge Instruction: Apply over primary dressing as directed. Secured With Compression Wrap ThreePress (3 layer compression wrap) Discharge Instruction: Apply three layer compression as directed. Compression Stockings Add-Ons Electronic Signature(s) Signed: 11/10/2020 6:11:22 PM By: Deon Pilling RN, BSN Entered By:  Deon Pilling on 11/10/2020 09:46:19 -------------------------------------------------------------------------------- Vitals Details Patient Name: Date of Service: [name] [name] V. 11/10/2020 9:00 A M Medical Record Number: [id number] Patient Account Number: [id number] Date of Birth/Sex: Treating RN: 1928/05/14 (87 y.o. [name] Primary Care Derisha Funderburke: [name] Other Clinician: Referring Chenise Mulvihill: Treating Tavia Stave/Extender: [name] in Treatment: 0 Vital Signs Time Taken: 09:20 Temperature (F): 97.6 Height (in): 72 Pulse (bpm): 48 Source: Stated Respiratory Rate (breaths/min): 20 Weight (lbs): 190 Blood Pressure (mmHg): 152/93 Source: Stated Reference Range: 80 - 120 mg / dl Body Mass Index (BMI): 25.8 Electronic Signature(s) Signed: 11/10/2020 6:11:22 PM By: Deon Pilling RN, BSN Entered By: Deon Pilling on 11/10/2020 09:24:50

## 2020-11-10 NOTE — Progress Notes (Signed)
MARTEN, ILES ([id number]) Visit Report for 11/10/2020 Abuse/Suicide Risk Screen Details Patient Name: Date of Service: KEDARIUS, ALOISI 11/10/2020 9:00 A M Medical Record Number: [id number] Patient Account Number: [id number] Date of Birth/Sex: Treating RN: 22-Aug-1928 (87 y.o. [name] Primary Care Laron Boorman: [name] Other Clinician: Referring Lissa Rowles: Treating Aryelle Figg/Extender: [name] in Treatment: 0 Abuse/Suicide Risk Screen Items Answer ABUSE RISK SCREEN: Has anyone close to you tried to hurt or harm you recentlyo No Do you feel uncomfortable with anyone in your familyo No Has anyone forced you do things that you didnt want to doo No Electronic Signature(s) Signed: 11/10/2020 6:11:22 PM By: Deon Pilling RN, BSN Entered By: Deon Pilling on 11/10/2020 09:27:05 -------------------------------------------------------------------------------- Activities of Daily Living Details Patient Name: Date of Service: MEKEL, HAVERSTOCK 11/10/2020 9:00 A M Medical Record Number: [id number] Patient Account Number: [id number] Date of Birth/Sex: Treating RN: [birth year] (87 y.o. [name] Primary Care Marine Lezotte: [name] Other Clinician: Referring Laurin Paulo: Treating Nieve Rojero/Extender: [name] in Treatment: 0 Activities of Daily Living Items Answer Activities of Daily Living (Please select one for each item) Drive Automobile Not Able T Medications ake Completely Able Use T elephone Completely Able Care for Appearance Completely Able Use T oilet Completely Able Bath / Shower Completely Able Dress Self Completely Able Feed Self Completely Able Walk Need Assistance Get In / Out Bed Completely Able Housework Completely Able Prepare Meals Completely [place] for Self Completely Able Electronic Signature(s) Signed: 11/10/2020 6:11:22 PM By: Deon Pilling RN, BSN Entered  By: Deon Pilling on 11/10/2020 09:26:59 -------------------------------------------------------------------------------- Education Screening Details Patient Name: Date of Service: Bonney Leitz V. 11/10/2020 9:00 A M Medical Record Number: [id number] Patient Account Number: [id number] Date of Birth/Sex: Treating RN: 1929/01/30 (87 y.o. [name] Primary Care Osher Oettinger: [name] Other Clinician: Referring Dianca Owensby: Treating Vinetta Brach/Extender: [name] in Treatment: 0 Primary Learner Assessed: Patient Learning Preferences/Education Level/Primary Language Learning Preference: Explanation, Demonstration, Printed Material Highest Education Level: College or Above Preferred Language: English Cognitive Barrier Language Barrier: No Translator Needed: No Memory Deficit: No Emotional Barrier: No Cultural/Religious Beliefs Affecting Medical Care: No Physical Barrier Impaired Vision: Yes macular degeneration Impaired Hearing: Yes Hearing Aid, HOH Decreased Hand dexterity: No Knowledge/Comprehension Knowledge Level: High Comprehension Level: High Ability to understand written instructions: High Ability to understand verbal instructions: High Motivation Anxiety Level: Calm Cooperation: Cooperative Education Importance: Acknowledges Need Interest in Health Problems: Asks Questions Perception: Coherent Willingness to Engage in Self-Management High Activities: Readiness to Engage in Self-Management High Activities: Electronic Signature(s) Signed: 11/10/2020 6:11:22 PM By: Deon Pilling RN, BSN Entered By: Deon Pilling on 11/10/2020 09:28:06 -------------------------------------------------------------------------------- Fall Risk Assessment Details Patient Name: Date of Service: Bonney Leitz V. 11/10/2020 9:00 A M Medical Record Number: [id number] Patient Account Number: [id number] Date of Birth/Sex: Treating RN: November 15, 1928 (87 y.o. [name] Primary Care Kate Sweetman: [name] Other Clinician: Referring Ezequias Lard: Treating Jaylie Neaves/Extender: [name] in Treatment: 0 Fall Risk Assessment Items Have you had 2 or more falls in the last 12 monthso 0 No Have you had any fall that resulted in injury in the last 12 monthso 0 No FALLS RISK SCREEN History of falling - immediate or within 3 months 0 No Secondary diagnosis (Do you have 2 or more medical diagnoseso) 0 No Ambulatory aid None/bed rest/wheelchair/nurse 0 No Crutches/cane/walker 15 Yes Furniture 0 No Intravenous therapy Access/Saline/Heparin  Lock 0 No Gait/Transferring Normal/ bed rest/ wheelchair 0 No Weak (short steps with or without shuffle, stooped but able to lift head while walking, may seek 10 Yes support from furniture) Impaired (short steps with shuffle, may have difficulty arising from chair, head down, impaired 0 No balance) Mental Status Oriented to own ability 0 Yes Electronic Signature(s) Signed: 11/10/2020 6:11:22 PM By: Deon Pilling RN, BSN Entered By: Deon Pilling on 11/10/2020 57:01:77 -------------------------------------------------------------------------------- Foot Assessment Details Patient Name: Date of Service: [name] 11/10/2020 9:00 A M Medical Record Number: [id number] Patient Account Number: [id number] Date of Birth/Sex: Treating RN: 17-May-1928 (87 y.o. [name] Primary Care Airon Sahni: [name] Other Clinician: Referring Kateryn Marasigan: Treating Kahil Agner/Extender: [name] in Treatment: 0 Foot Assessment Items Site Locations + = Sensation present, - = Sensation absent, C = Callus, U = Ulcer R = Redness, W = Warmth, M = Maceration, PU = Pre-ulcerative lesion F = Fissure, S = Swelling, D = Dryness Assessment Right: Left: Other Deformity: No No Prior Foot Ulcer: No No Prior Amputation: No No Charcot Joint: No No Ambulatory Status:  Ambulatory With Help Assistance Device: Cane Gait: Steady Notes HOH and aphasia Electronic Signature(s) Signed: 11/10/2020 6:11:22 PM By: Deon Pilling RN, BSN Entered By: Deon Pilling on 11/10/2020 09:42:23 -------------------------------------------------------------------------------- Nutrition Risk Screening Details Patient Name: Date of Service: Bonney Leitz V. 11/10/2020 9:00 A M Medical Record Number: [id number] Patient Account Number: [id number] Date of Birth/Sex: Treating RN: [birth year] (87 y.o. Lorette Ang, Meta.Reding Primary Care Jerik Falletta: [name] Other Clinician: Referring Rhyan Radler: Treating Ugonna Keirsey/Extender: [name] in Treatment: 0 Height (in): 72 Weight (lbs): 190 Body Mass Index (BMI): 25.8 Nutrition Risk Screening Items Score Screening NUTRITION RISK SCREEN: I have an illness or condition that made me change the kind and/or amount of food I eat 2 Yes I eat fewer than two meals per day 0 No I eat few fruits and vegetables, or milk products 0 No I have three or more drinks of beer, liquor or wine almost every day 0 No I have tooth or mouth problems that make it hard for me to eat 0 No I don't always have enough money to buy the food I need 0 No I eat alone most of the time 0 No I take three or more different prescribed or over-the-counter drugs a day 1 Yes Without wanting to, I have lost or gained 10 pounds in the last six months 0 No I am not always physically able to shop, cook and/or feed myself 0 No Nutrition Protocols Good Risk Protocol Moderate Risk Protocol 0 Provide education on nutrition High Risk Proctocol Risk Level: Moderate Risk Score: 3 Electronic Signature(s) Signed: 11/10/2020 6:11:22 PM By: Deon Pilling RN, BSN Entered By: Deon Pilling on 11/10/2020 09:28:36

## 2020-11-10 NOTE — Progress Notes (Addendum)
[name], [name] ([id number]) Visit Report for 11/10/2020 Chief Complaint Document Details Patient Name: Date of Service: [name], [name] 11/10/2020 9:00 A M Medical Record Number: [id number] Patient Account Number: [id number] Date of Birth/Sex: Treating RN: 03-16-1928 (87 y.o. [name] Primary Care Provider: [name] Other Clinician: Referring Provider: Treating Provider/Extender: [name] in Treatment: 0 Information Obtained from: Patient Chief Complaint Right LE Ulcers Electronic Signature(s) Signed: 11/10/2020 10:33:55 AM By: Worthy Keeler PA-C Entered By: Worthy Keeler on 11/10/2020 10:33:55 -------------------------------------------------------------------------------- HPI Details Patient Name: Date of Service: [name] Lefort RLES V. 11/10/2020 9:00 A M Medical Record Number: [id number] Patient Account Number: [id number] Date of Birth/Sex: Treating RN: 06/24/1928 (87 y.o. [name] Primary Care Provider: [name] Other Clinician: Referring Provider: Treating Provider/Extender: [name] in Treatment: 0 History of Present Illness HPI Description: 11/10/2020 upon evaluation today patient presents for initial evaluation and inspection here in the clinic concerning a wound that he has over the right lower extremity. He has been tolerating the dressing changes currently at home they have been using just more antibiotic ointment and what they could in general in that regard. With that being said the patient does have a history of having frequent issues with this over the past 2 years intermittently. He has chronic venous insufficiency, lymphedema, coronary artery disease, hypertension, paroxysmal atrial fibrillation, long-term use of anticoagulants due to his history of stroke as well where he does have residual right-sided weakness following the cerebrovascular accident. He also has previously had  veins taken from his right leg to provide a harvest material for the coronary artery bypass surgery. Subsequently this is also the leg that seems to swell the most understandably. Electronic Signature(s) Signed: 11/10/2020 11:58:32 AM By: Worthy Keeler PA-C Entered By: Worthy Keeler on 11/10/2020 11:58:32 -------------------------------------------------------------------------------- Physical Exam Details Patient Name: Date of Service: [name], [name] 11/10/2020 9:00 A M Medical Record Number: [id number] Patient Account Number: [id number] Date of Birth/Sex: Treating RN: [birth year] (87 y.o. [name] Primary Care Provider: Other Clinician: [name] Referring Provider: Treating Provider/Extender: [name] in Treatment: 0 Constitutional patient is hypertensive.. pulse regular and within target range for patient.Marland Kitchen respirations regular, non-labored and within target range for patient.Marland Kitchen temperature within target range for patient.. Well-nourished and well-hydrated in no acute distress. Eyes conjunctiva clear no eyelid edema noted. pupils equal round and reactive to light and accommodation. Ears, Nose, Mouth, and Throat no gross abnormality of ear auricles or external auditory canals. normal hearing noted during conversation. mucus membranes moist. Respiratory normal breathing without difficulty. Cardiovascular 2+ dorsalis pedis/posterior tibialis pulses. 3+ pitting edema of the bilateral lower extremities. Musculoskeletal normal gait and posture. no significant deformity or arthritic changes, no loss or range of motion, no clubbing. Psychiatric this patient is able to make decisions and demonstrates good insight into disease process. Alert and Oriented x 3. pleasant and cooperative. Notes Upon inspection patient's wounds again appear to be what I would really consider more trauma type wounds that are having a hard time heal due to the  chronic venous stasis. Subsequently I do believe that the patient also has significant swelling and he has 3+ pitting edema easily bilaterally although it is right greater than left as far as the severity is concerned. In general I think that his pulses are good when she gets throughout the edema he seemed to have excellent pulses. His ABI was also 1.25  and seem to be okay as well. Electronic Signature(s) Signed: 11/10/2020 11:59:14 AM By: Worthy Keeler PA-C Entered By: Worthy Keeler on 11/10/2020 11:59:14 -------------------------------------------------------------------------------- Physician Orders Details Patient Name: Date of Service: [name] Lefort RLES V. 11/10/2020 9:00 A M Medical Record Number: [id number] Patient Account Number: [id number] Date of Birth/Sex: Treating RN: 03-19-1928 (87 y.o. [name] Primary Care Provider: [name] Other Clinician: Referring Provider: Treating Provider/Extender: [name] in Treatment: 0 Verbal / Phone Orders: No Diagnosis Coding ICD-10 Coding Code Description I87.2 Venous insufficiency (chronic) (peripheral) I89.0 Lymphedema, not elsewhere classified L97.812 Non-pressure chronic ulcer of other part of right lower leg with fat layer exposed I25.10 Atherosclerotic heart disease of native coronary artery without angina pectoris I10 Essential (primary) hypertension I48.0 Paroxysmal atrial fibrillation Z79.01 Long term (current) use of anticoagulants Follow-up Appointments Return Appointment in 1 week. Bathing/ Shower/ Hygiene May shower with protection but do not get wound dressing(s) wet. - can be purchased at [facility] or [facility] Edema Control - Lymphedema / SCD / Other Bilateral Lower Extremities Elevate legs to the level of the heart or above for 30 minutes daily and/or when sitting, a frequency of: - throughout the day Avoid standing for long periods of time. Exercise regularly Compression  stocking or Garment 20-30 mm/Hg pressure to: - left leg daily Wound Treatment Wound #1 - Lower Leg Wound Laterality: Right, Anterior Peri-Wound Care: Sween Lotion (Moisturizing lotion) 1 x Per Week/15 Days Discharge Instructions: Apply moisturizing lotion as directed Prim Dressing: KerraCel Ag Gelling Fiber Dressing, 2x2 in (silver alginate) 1 x Per Week/15 Days ary Discharge Instructions: Apply silver alginate to wound bed as instructed Secondary Dressing: Woven Gauze Sponge, Non-Sterile 4x4 in 1 x Per Week/15 Days Discharge Instructions: Apply over primary dressing as directed. Compression Wrap: ThreePress (3 layer compression wrap) 1 x Per Week/15 Days Discharge Instructions: Apply three layer compression as directed. Wound #2 - Lower Leg Wound Laterality: Right, Medial Peri-Wound Care: Sween Lotion (Moisturizing lotion) 1 x Per Week/15 Days Discharge Instructions: Apply moisturizing lotion as directed Prim Dressing: KerraCel Ag Gelling Fiber Dressing, 2x2 in (silver alginate) 1 x Per Week/15 Days ary Discharge Instructions: Apply silver alginate to wound bed as instructed Secondary Dressing: Woven Gauze Sponge, Non-Sterile 4x4 in 1 x Per Week/15 Days Discharge Instructions: Apply over primary dressing as directed. Compression Wrap: ThreePress (3 layer compression wrap) 1 x Per Week/15 Days Discharge Instructions: Apply three layer compression as directed. Patient Medications llergies: Cipro, Sulfa (Sulfonamide Antibiotics) A Notifications Medication Indication Start End 11/11/2020 doxycycline hyclate DOSE 1 - oral 100 mg capsule - 1 capsule oral taken 2 times per day for 14 days Electronic Signature(s) Signed: 11/11/2020 7:39:12 AM By: Worthy Keeler PA-C Previous Signature: 11/10/2020 5:28:57 PM Version By: Worthy Keeler PA-C Previous Signature: 11/10/2020 6:35:12 PM Version By: [name] RN, BSN Entered By: Worthy Keeler on 11/11/2020  07:39:12 -------------------------------------------------------------------------------- Problem List Details Patient Name: Date of Service: [name] Lefort RLES V. 11/10/2020 9:00 A M Medical Record Number: [id number] Patient Account Number: [id number] Date of Birth/Sex: Treating RN: [birth year] (87 y.o. [name] Primary Care Provider: [name] Other Clinician: Referring Provider: Treating Provider/Extender: [name] in Treatment: 0 Active Problems ICD-10 Encounter Code Description Active Date MDM Diagnosis I87.2 Venous insufficiency (chronic) (peripheral) 11/10/2020 No Yes I89.0 Lymphedema, not elsewhere classified 11/10/2020 No Yes L97.812 Non-pressure chronic ulcer of other part of right lower leg with fat layer 11/10/2020 No Yes exposed I25.10  Atherosclerotic heart disease of native coronary artery without angina pectoris 11/10/2020 No Yes I10 Essential (primary) hypertension 11/10/2020 No Yes I48.0 Paroxysmal atrial fibrillation 11/10/2020 No Yes Z79.01 Long term (current) use of anticoagulants 11/10/2020 No Yes Inactive Problems Resolved Problems Electronic Signature(s) Signed: 11/10/2020 10:33:39 AM By: Worthy Keeler PA-C Previous Signature: 11/10/2020 10:31:40 AM Version By: Worthy Keeler PA-C Entered By: Worthy Keeler on 11/10/2020 10:33:39 -------------------------------------------------------------------------------- Progress Note Details Patient Name: Date of Service: [name] Lefort RLES V. 11/10/2020 9:00 A M Medical Record Number: [id number] Patient Account Number: [id number] Date of Birth/Sex: Treating RN: Mar 25, 1928 (87 y.o. [name] Primary Care Provider: [name] Other Clinician: Referring Provider: Treating Provider/Extender: [name] in Treatment: 0 Subjective Chief Complaint Information obtained from Patient Right LE Ulcers History of Present Illness (HPI) 11/10/2020  upon evaluation today patient presents for initial evaluation and inspection here in the clinic concerning a wound that he has over the right lower extremity. He has been tolerating the dressing changes currently at home they have been using just more antibiotic ointment and what they could in general in that regard. With that being said the patient does have a history of having frequent issues with this over the past 2 years intermittently. He has chronic venous insufficiency, lymphedema, coronary artery disease, hypertension, paroxysmal atrial fibrillation, long-term use of anticoagulants due to his history of stroke as well where he does have residual right-sided weakness following the cerebrovascular accident. He also has previously had veins taken from his right leg to provide a harvest material for the coronary artery bypass surgery. Subsequently this is also the leg that seems to swell the most understandably. Patient History Allergies Cipro (Reaction: unsure), Sulfa (Sulfonamide Antibiotics) (Reaction: unsure) Family History Diabetes - Siblings, Heart Disease - Mother, Hypertension - Mother, Kidney Disease - Siblings, Stroke - Mother, No family history of Cancer, Hereditary Spherocytosis, Lung Disease, Seizures, Thyroid Problems, Tuberculosis. Social History Former smoker - 84 years ago, Marital Status - Married, Alcohol Use - Never, Drug Use - No History, Caffeine Use - Never. Medical History Eyes Denies history of Cataracts, Glaucoma, Optic Neuritis Ear/Nose/Mouth/Throat Denies history of Chronic sinus problems/congestion, Middle ear problems Hematologic/Lymphatic Denies history of Anemia, Hemophilia, Human Immunodeficiency Virus, Lymphedema, Sickle Cell Disease Respiratory Denies history of Aspiration, Asthma, Chronic Obstructive Pulmonary Disease (COPD), Pneumothorax, Sleep Apnea, Tuberculosis Cardiovascular Patient has history of Arrhythmia - A. 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 Medical A Surgical History Notes nd Constitutional Symptoms (General Health) CVA- right sided weakness Aphasia Eyes macular degeneration Oncologic Hx squamous cell right hand Review of Systems (ROS) Constitutional Symptoms (General Health) Denies complaints or symptoms of Fatigue, Fever, Chills, Marked Weight Change. Eyes Complains or has symptoms of Vision Changes - macular degeneration. Denies complaints or symptoms of Dry Eyes, Glasses / Contacts. Ear/Nose/Mouth/Throat Denies complaints or symptoms  of Chronic sinus problems or rhinitis. Respiratory Denies complaints or symptoms of Chronic or frequent coughs, Shortness of Breath. Cardiovascular Denies complaints or symptoms of Chest pain. Gastrointestinal Denies complaints or symptoms of Frequent diarrhea, Nausea, Vomiting. Endocrine Denies complaints or symptoms of Heat/cold intolerance. Genitourinary Denies complaints or symptoms of Frequent urination. Integumentary (Skin) Complains or has symptoms of  Wounds - right leg. Musculoskeletal Denies complaints or symptoms of Muscle Pain, Muscle Weakness. Neurologic Denies complaints or symptoms of Numbness/parasthesias. Psychiatric Denies complaints or symptoms of Claustrophobia, Suicidal. Objective Constitutional patient is hypertensive.. pulse regular and within target range for patient.Marland Kitchen respirations regular, non-labored and within target range for patient.Marland Kitchen temperature within target range for patient.. Well-nourished and well-hydrated in no acute distress. Vitals Time Taken: 9:20 AM, Height: 72 in, Source: Stated, Weight: 190 lbs, Source: Stated, BMI: 25.8, Temperature: 97.6 F, Pulse: 48 bpm, Respiratory Rate: 20 breaths/min, Blood Pressure: 152/93 mmHg. Eyes conjunctiva clear no eyelid edema noted. pupils equal round and reactive to light and accommodation. Ears, Nose, Mouth, and Throat no gross abnormality of ear auricles or external auditory canals. normal hearing noted during conversation. mucus membranes moist. Respiratory normal breathing without difficulty. Cardiovascular 2+ dorsalis pedis/posterior tibialis pulses. 3+ pitting edema of the bilateral lower extremities. Musculoskeletal normal gait and posture. no significant deformity or arthritic changes, no loss or range of motion, no clubbing. Psychiatric this patient is able to make decisions and demonstrates good insight into disease process. Alert and Oriented x 3. pleasant and cooperative. General Notes: Upon inspection patient's wounds again appear to be what I would really consider more trauma type wounds that are having a hard time heal due to the chronic venous stasis. Subsequently I do believe that the patient also has significant swelling and he has 3+ pitting edema easily bilaterally although it is right greater than left as far as the severity is concerned. In general I think that his pulses are good when she gets throughout the edema he seemed to have excellent  pulses. His ABI was also 1.25 and seem to be okay as well. Integumentary (Hair, Skin) Wound #1 status is Open. Original cause of wound was Gradually Appeared. The date acquired was: 11/10/2020. The wound is located on the Right,Anterior Lower Leg. The wound measures 1.8cm length x 1.8cm width x 0.1cm depth; 2.545cm^2 area and 0.254cm^3 volume. There is Fat Layer (Subcutaneous Tissue) exposed. There is no tunneling or undermining noted. There is a medium amount of serosanguineous drainage noted. The wound margin is distinct with the outline attached to the wound base. There is large (67-100%) pink granulation within the wound bed. There is a small (1-33%) amount of necrotic tissue within the wound bed including Adherent Slough. Wound #2 status is Open. Original cause of wound was Gradually Appeared. The date acquired was: 11/10/2020. The wound is located on the Right,Medial Lower Leg. The wound measures 1cm length x 0.4cm width x 0.1cm depth; 0.314cm^2 area and 0.031cm^3 volume. There is Fat Layer (Subcutaneous Tissue) exposed. There is no tunneling or undermining noted. There is a medium amount of serosanguineous drainage noted. The wound margin is distinct with the outline attached to the wound base. There is large (67-100%) red granulation within the wound bed. There is no necrotic tissue within the wound bed. Assessment Active Problems ICD-10 Venous insufficiency (chronic) (peripheral) Lymphedema, not elsewhere classified Non-pressure chronic ulcer of other part of right lower leg with fat layer exposed Atherosclerotic heart disease of native coronary artery without angina pectoris Essential (primary) hypertension Paroxysmal atrial  fibrillation Long term (current) use of anticoagulants Procedures Wound #1 Pre-procedure diagnosis of Wound #1 is a Lymphedema located on the Right,Anterior Lower Leg . There was a Three Layer Compression Therapy Procedure by [name], RN. Post procedure  Diagnosis Wound #1: Same as Pre-Procedure Wound #2 Pre-procedure diagnosis of Wound #2 is a Lymphedema located on the Right,Medial Lower Leg . There was a Three Layer Compression Therapy Procedure by [name], RN. Post procedure Diagnosis Wound #2: Same as Pre-Procedure Plan Follow-up Appointments: Return Appointment in 1 week. Bathing/ Shower/ Hygiene: May shower with protection but do not get wound dressing(s) wet. - can be purchased at [facility] Edema Control - Lymphedema / SCD / Other: Elevate legs to the level of the heart or above for 30 minutes daily and/or when sitting, a frequency of: - throughout the day Avoid standing for long periods of time. Exercise regularly Compression stocking or Garment 20-30 mm/Hg pressure to: - left leg daily The following medication(s) was prescribed: doxycycline hyclate oral 100 mg capsule 1 1 capsule oral taken 2 times per day for 14 days starting 11/11/2020 WOUND #1: - Lower Leg Wound Laterality: Right, Anterior Peri-Wound Care: Sween Lotion (Moisturizing lotion) 1 x Per Week/15 Days Discharge Instructions: Apply moisturizing lotion as directed Prim Dressing: KerraCel Ag Gelling Fiber Dressing, 2x2 in (silver alginate) 1 x Per Week/15 Days ary Discharge Instructions: Apply silver alginate to wound bed as instructed Secondary Dressing: Woven Gauze Sponge, Non-Sterile 4x4 in 1 x Per Week/15 Days Discharge Instructions: Apply over primary dressing as directed. Com pression Wrap: ThreePress (3 layer compression wrap) 1 x Per Week/15 Days Discharge Instructions: Apply three layer compression as directed. WOUND #2: - Lower Leg Wound Laterality: Right, Medial Peri-Wound Care: Sween Lotion (Moisturizing lotion) 1 x Per Week/15 Days Discharge Instructions: Apply moisturizing lotion as directed Prim Dressing: KerraCel Ag Gelling Fiber Dressing, 2x2 in (silver alginate) 1 x Per Week/15 Days ary Discharge Instructions: Apply silver alginate to  wound bed as instructed Secondary Dressing: Woven Gauze Sponge, Non-Sterile 4x4 in 1 x Per Week/15 Days Discharge Instructions: Apply over primary dressing as directed. Com pression Wrap: ThreePress (3 layer compression wrap) 1 x Per Week/15 Days Discharge Instructions: Apply three layer compression as directed. 1. Based on what I am seeing currently I do believe that the patient is good to respond fairly well to compression wraps. I do believe he also should be wearing compression stockings on a regular basis I think this is good to be of utmost importance for him in general. 2. I am also going to recommend at this time that we have the patient continue to elevate his legs much as possible he should also be sleeping in the bed which she does seem to be doing at this point. We will see patient back for reevaluation in 1 week here in the clinic. If anything worsens or changes patient will contact our office for additional recommendations. Electronic Signature(s) Signed: 11/11/2020 7:40:39 AM By: Worthy Keeler PA-C Previous Signature: 11/10/2020 12:00:11 PM Version By: Worthy Keeler PA-C Entered By: Worthy Keeler on 11/11/2020 07:39:55 -------------------------------------------------------------------------------- HxROS Details Patient Name: Date of Service: [name] Lefort RLES V. 11/10/2020 9:00 A M Medical Record Number: [id number] Patient Account Number: [id number] Date of Birth/Sex: Treating RN: 1928/12/30 (87 y.o. [name] Primary Care Provider: [name] Other Clinician: Referring Provider: Treating Provider/Extender: [name] in Treatment: 0 Constitutional Symptoms (General Health) Complaints and Symptoms: Negative for: Fatigue; Fever; Chills;  Marked Weight Change Medical History: Past Medical History Notes: CVA- right sided weakness Aphasia Eyes Complaints and Symptoms: Positive for: Vision Changes - macular degeneration Negative for:  Dry Eyes; Glasses / Contacts Medical History: Negative for: Cataracts; Glaucoma; Optic Neuritis Past Medical History Notes: macular degeneration Ear/Nose/Mouth/Throat Complaints and Symptoms: Negative for: Chronic sinus problems or rhinitis Medical History: Negative for: Chronic sinus problems/congestion; Middle ear problems Respiratory Complaints and Symptoms: Negative for: Chronic or frequent coughs; Shortness of Breath Medical History: Negative for: Aspiration; Asthma; Chronic Obstructive Pulmonary Disease (COPD); Pneumothorax; Sleep Apnea; Tuberculosis Cardiovascular Complaints and Symptoms: Negative for: Chest pain Medical History: Positive for: Arrhythmia - A. Fib; Coronary Artery Disease; Hypertension Negative for: Angina; Congestive Heart Failure; Hypotension; Myocardial Infarction; Peripheral Arterial Disease; Peripheral Venous Disease; Phlebitis; Vasculitis Gastrointestinal Complaints and Symptoms: Negative for: Frequent diarrhea; Nausea; Vomiting Medical History: Negative for: Cirrhosis ; Colitis; Crohns; Hepatitis A; Hepatitis B; Hepatitis C Endocrine Complaints and Symptoms: Negative for: Heat/cold intolerance Medical History: Negative for: Type I Diabetes; Type II Diabetes Genitourinary Complaints and Symptoms: Negative for: Frequent urination Medical History: Positive for: End Stage Renal Disease - Stage IV CKD Integumentary (Skin) Complaints and Symptoms: Positive for: Wounds - right leg Medical History: Negative for: History of Burn Musculoskeletal Complaints and Symptoms: Negative for: Muscle Pain; Muscle Weakness Medical History: Negative for: Gout; Rheumatoid Arthritis; Osteoarthritis; Osteomyelitis Neurologic Complaints and Symptoms: Negative for: Numbness/parasthesias Medical History: Negative for: Dementia; Neuropathy; Quadriplegia; Paraplegia; Seizure Disorder Psychiatric Complaints and Symptoms: Negative for: Claustrophobia;  Suicidal Medical History: Negative for: Anorexia/bulimia; Confinement Anxiety Hematologic/Lymphatic Medical History: Negative for: Anemia; Hemophilia; Human Immunodeficiency Virus; Lymphedema; Sickle Cell Disease Immunological Medical History: Negative for: Lupus Erythematosus; Raynauds; Scleroderma Oncologic Medical History: Negative for: Received Chemotherapy; Received Radiation Past Medical History Notes: Hx squamous cell right hand Immunizations Pneumococcal Vaccine: Received Pneumococcal Vaccination: No Implantable Devices No devices added Hospitalization / Surgery History Type of Hospitalization/Surgery CVA 15 years ago Family and Social History Cancer: No; Diabetes: Yes - Siblings; Heart Disease: Yes - Mother; Hereditary Spherocytosis: No; Hypertension: Yes - Mother; Kidney Disease: Yes - Siblings; Lung Disease: No; Seizures: No; Stroke: Yes - Mother; Thyroid Problems: No; Tuberculosis: No; Former smoker - 50 years ago; Marital Status - Married; Alcohol Use: Never; Drug Use: No History; Caffeine Use: Never; Financial Concerns: No; Food, Clothing or Shelter Needs: No; Support System Lacking: No; Transportation Concerns: No Electronic Signature(s) Signed: 11/10/2020 5:28:57 PM By: Worthy Keeler PA-C Signed: 11/10/2020 6:11:22 PM By: Deon Pilling RN, BSN Entered By: Deon Pilling on 11/10/2020 09:34:12 -------------------------------------------------------------------------------- SuperBill Details Patient Name: Date of Service: [name] 11/10/2020 Medical Record Number: [id number] Patient Account Number: [id number] Date of Birth/Sex: Treating RN: [birth year] (87 y.o. 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

## 2020-11-17 ENCOUNTER — Other Ambulatory Visit: Payer: Self-pay

## 2020-11-17 ENCOUNTER — Encounter (HOSPITAL_BASED_OUTPATIENT_CLINIC_OR_DEPARTMENT_OTHER): Payer: Medicare Other | Admitting: Physician Assistant

## 2020-11-17 DIAGNOSIS — L97812 Non-pressure chronic ulcer of other part of right lower leg with fat layer exposed: Secondary | ICD-10-CM | POA: Diagnosis not present

## 2020-11-17 DIAGNOSIS — I2510 Atherosclerotic heart disease of native coronary artery without angina pectoris: Secondary | ICD-10-CM | POA: Diagnosis not present

## 2020-11-17 DIAGNOSIS — Z7901 Long term (current) use of anticoagulants: Secondary | ICD-10-CM | POA: Diagnosis not present

## 2020-11-17 DIAGNOSIS — I890 Lymphedema, not elsewhere classified: Secondary | ICD-10-CM | POA: Diagnosis not present

## 2020-11-17 DIAGNOSIS — L97822 Non-pressure chronic ulcer of other part of left lower leg with fat layer exposed: Secondary | ICD-10-CM | POA: Diagnosis not present

## 2020-11-17 DIAGNOSIS — I69351 Hemiplegia and hemiparesis following cerebral infarction affecting right dominant side: Secondary | ICD-10-CM | POA: Diagnosis not present

## 2020-11-17 DIAGNOSIS — I872 Venous insufficiency (chronic) (peripheral): Secondary | ICD-10-CM | POA: Diagnosis not present

## 2020-11-17 DIAGNOSIS — I480 Paroxysmal atrial fibrillation: Secondary | ICD-10-CM | POA: Diagnosis not present

## 2020-11-17 DIAGNOSIS — Z951 Presence of aortocoronary bypass graft: Secondary | ICD-10-CM | POA: Diagnosis not present

## 2020-11-17 DIAGNOSIS — I10 Essential (primary) hypertension: Secondary | ICD-10-CM | POA: Diagnosis not present

## 2020-11-17 NOTE — Progress Notes (Signed)
[name] ([id number]) Visit Report for 11/17/2020 Arrival Information Details Patient Name: Date of Service: [name], [name] 11/17/2020 10:45 A M Medical Record Number: [id number] Patient Account Number: [id number] Date of Birth/Sex: Treating RN: Sep 21, 1928 (87 y.o. [name] Primary Care [name]: [name] Other Clinician: Referring [name]: Treating Taeveon Keesling/Extender: [name] in Treatment: 1 Visit Information History Since Last Visit Added or deleted any medications: No Patient Arrived: [name] Any new allergies or adverse reactions: No Arrival Time: 11:05 Had a fall or experienced change in No Accompanied By: Daughter activities of daily living that may affect Transfer Assistance: None risk of falls: Patient Identification Verified: Yes Signs or symptoms of abuse/neglect since last visito No Secondary Verification Process Completed: Yes Hospitalized since last visit: No Patient Requires Transmission-Based Precautions: No Has Dressing in Place as Prescribed: Yes Patient Has Alerts: Yes Has Compression in Place as Prescribed: Yes Patient Alerts: Patient on Blood Thinner Pain Present Now: No Electronic Signature(s) Signed: 11/17/2020 5:07:25 PM By: Lorrin Jackson Entered By: Lorrin Jackson on 11/17/2020 11:09:42 -------------------------------------------------------------------------------- Compression Therapy Details Patient Name: Date of Service: [name] 11/17/2020 10:45 A M Medical Record Number: [id number] Patient Account Number: [id number] Date of Birth/Sex: Treating RN: 07-21-1928 (87 y.o. [name] Primary Care [name]: [name] Other Clinician: Referring Square Jowett: Treating Petrita Blunck/Extender: [name] in Treatment: 1 Compression Therapy Performed for Wound Assessment: Wound #1 Right,Anterior Lower Leg Performed By: Clinician [name], RN Compression  Type: Three Layer Post Procedure Diagnosis Same as Pre-procedure Electronic Signature(s) Signed: 11/17/2020 5:43:52 PM By: [name] RN, BSN Entered By: [name] on 11/17/2020 16:05:55 -------------------------------------------------------------------------------- Compression Therapy Details Patient Name: Date of Service: [name] 11/17/2020 10:45 A M Medical Record Number: [id number] Patient Account Number: [id number] Date of Birth/Sex: Treating RN: 06/25/1928 (87 y.o. [name] Primary Care Ka Flammer: [name] Other Clinician: Referring Chia Mowers: Treating Hulda Reddix/Extender: [name] in Treatment: 1 Compression Therapy Performed for Wound Assessment: Wound #3 Left,Anterior Lower Leg Performed By: Clinician [name], RN Compression Type: Three Layer Post Procedure Diagnosis Same as Pre-procedure Electronic Signature(s) Signed: 11/17/2020 5:43:52 PM By: [name] RN, BSN Entered By: [name] on 11/17/2020 16:05:55 -------------------------------------------------------------------------------- Encounter Discharge Information Details Patient Name: Date of Service: [name] 11/17/2020 10:45 A M Medical Record Number: [id number] Patient Account Number: [id number] Date of Birth/Sex: Treating RN: [birth year] (87 y.o. [name] Primary Care Belvia Gotschall: [name] Other Clinician: Referring Terriana Barreras: Treating Aliah Eriksson/Extender: [name] in Treatment: 1 Encounter Discharge Information Items Discharge Condition: Stable Ambulatory Status: Cane Discharge Destination: Home Transportation: Private Auto Accompanied By: daughter Schedule Follow-up Appointment: Yes Clinical Summary of Care: Electronic Signature(s) Signed: 11/17/2020 5:58:14 PM By: Deon Pilling RN, BSN Entered By: Deon Pilling on 11/17/2020  17:57:40 -------------------------------------------------------------------------------- Lower Extremity Assessment Details Patient Name: Date of Service: [name], [name] 11/17/2020 10:45 A M Medical Record Number: [id number] Patient Account Number: [id number] Date of Birth/Sex: Treating RN: [birth year] (87 y.o. [name] Primary Care [name]: [name] Other Clinician: Referring [name]: Treating [name]/Extender: [name] in Treatment: 1 Edema Assessment Assessed: [Left: No] [name]: Yes] Edema: [Left: Yes] [Right: Yes] Calf Left: Right: Point of Measurement: 35 cm From Medial Instep 31.5 cm 33.4 cm Ankle Left: Right: Point of Measurement: 12 cm From Medial Instep 22.5 cm 23 cm Knee To Floor Left: Right: From Medial Instep 44 cm 44  cm Vascular Assessment Pulses: Dorsalis Pedis Palpable: [Right:Yes] Electronic Signature(s) Signed: 11/17/2020 5:07:25 PM By: Lorrin Jackson Signed: 11/17/2020 5:43:52 PM By: [name] RN, BSN Entered By: [name] on 11/17/2020 16:06:38 -------------------------------------------------------------------------------- Multi-Disciplinary Care Plan Details Patient Name: Date of Service: [name], [name] 11/17/2020 10:45 A M Medical Record Number: [id number] Patient Account Number: [id number] Date of Birth/Sex: Treating RN: Apr 26, 1928 (87 y.o. [name] Primary Care Kenley Troop: [name] Other Clinician: Referring Alysha Doolan: Treating Jashaun Penrose/Extender: [name] in Treatment: 1 Multidisciplinary Care Plan reviewed with physician Active Inactive Abuse / Safety / Falls / Self Care Management Nursing Diagnoses: Potential for falls Goals: Patient/caregiver will verbalize/demonstrate measures taken to prevent injury and/or falls Date Initiated: 11/10/2020 Target Resolution Date: 12/08/2020 Goal Status: Active Interventions: Assess fall risk on  admission and as needed Assess impairment of mobility on admission and as needed per policy Notes: Venous Leg Ulcer Nursing Diagnoses: Knowledge deficit related to disease process and management Potential for venous Insuffiency (use before diagnosis confirmed) Goals: Patient will maintain optimal edema control Date Initiated: 11/10/2020 Target Resolution Date: 12/08/2020 Goal Status: Active Interventions: Assess peripheral edema status every visit. Compression as ordered Provide education on venous insufficiency Treatment Activities: Therapeutic compression applied : 11/10/2020 Notes: Wound/Skin Impairment Nursing Diagnoses: Impaired tissue integrity Knowledge deficit related to ulceration/compromised skin integrity Goals: Patient/caregiver will verbalize understanding of skin care regimen Date Initiated: 11/10/2020 Target Resolution Date: 12/08/2020 Goal Status: Active Ulcer/skin breakdown will have a volume reduction of 30% by week 4 Date Initiated: 11/10/2020 Target Resolution Date: 12/08/2020 Goal Status: Active Interventions: Assess patient/caregiver ability to obtain necessary supplies Assess patient/caregiver ability to perform ulcer/skin care regimen upon admission and as needed Assess ulceration(s) every visit Provide education on ulcer and skin care Treatment Activities: Skin care regimen initiated : 11/10/2020 Topical wound management initiated : 11/10/2020 Notes: Electronic Signature(s) Signed: 11/17/2020 5:07:25 PM By: Lorrin Jackson Entered By: Lorrin Jackson on 11/17/2020 11:05:23 -------------------------------------------------------------------------------- Pain Assessment Details Patient Name: Date of Service: [name] 11/17/2020 10:45 A M Medical Record Number: [id number] Patient Account Number: [id number] Date of Birth/Sex: Treating RN: 04/07/1928 (87 y.o. [name] Primary Care Tramell Piechota: [name] Other Clinician: Referring  Jael Kostick: Treating Oluwademilade Kellett/Extender: [name] in Treatment: 1 Active Problems Location of Pain Severity and Description of Pain Patient Has Paino No Site Locations Pain Management and Medication Current Pain Management: Electronic Signature(s) Signed: 11/17/2020 5:07:25 PM By: Lorrin Jackson Entered By: Lorrin Jackson on 11/17/2020 11:11:16 -------------------------------------------------------------------------------- Patient/Caregiver Education Details Patient Name: Date of Service: [name] 9/21/2022andnbsp10:45 A M Medical Record Number: [id number] Patient Account Number: [id number] Date of Birth/Gender: Treating RN: 1928-04-12 (87 y.o. [name] Primary Care Physician: [name] Other Clinician: Referring Physician: Treating Physician/Extender: [name] in Treatment: 1 Education Assessment Education Provided To: Patient Education Topics Provided Venous: Methods: Explain/Verbal, Printed Responses: State content correctly Wound/Skin Impairment: Methods: Explain/Verbal, Printed Responses: State content correctly Electronic Signature(s) Signed: 11/17/2020 5:07:25 PM By: Lorrin Jackson Entered By: Lorrin Jackson on 11/17/2020 11:05:46 -------------------------------------------------------------------------------- Wound Assessment Details Patient Name: Date of Service: [name], [name] 11/17/2020 10:45 A M Medical Record Number: [id number] Patient Account Number: [id number] Date of Birth/Sex: Treating RN: [birth year] (87 y.o. [name] Primary Care Demitria Hay: [name] Other Clinician: Referring Stacia Feazell: Treating Jayle Solarz/Extender: [name] in Treatment: 1 Wound Status Wound Number: 1 Primary Lymphedema Etiology: Wound Location: Right, Anterior Lower Leg Wound Open Wounding  Event: Gradually Appeared Status: Date Acquired:  11/10/2020 Comorbid Arrhythmia, Coronary Artery Disease, Hypertension, End Weeks Of Treatment: 1 History: Stage Renal Disease Clustered Wound: No Photos Wound Measurements Length: (cm) 0.1 Width: (cm) 0.1 Depth: (cm) 0.1 Area: (cm) 0.008 Volume: (cm) 0.001 % Reduction in Area: 99.7% % Reduction in Volume: 99.6% Epithelialization: Large (67-100%) Tunneling: No Undermining: No Wound Description Classification: Full Thickness Without Exposed Support Structures Wound Margin: Distinct, outline attached Exudate Amount: Medium Exudate Type: Serosanguineous Exudate Color: red, brown Foul Odor After Cleansing: No Slough/Fibrino No Wound Bed Granulation Amount: Large (67-100%) Exposed Structure Granulation Quality: Pink Fascia Exposed: No Necrotic Amount: None Present (0%) Fat Layer (Subcutaneous Tissue) Exposed: Yes Tendon Exposed: No Muscle Exposed: No Joint Exposed: No Bone Exposed: No Treatment Notes Wound #1 (Lower Leg) Wound Laterality: Right, Anterior Cleanser Peri-Wound Care Sween Lotion (Moisturizing lotion) Discharge Instruction: Apply moisturizing lotion as directed Topical Primary Dressing KerraCel Ag Gelling Fiber Dressing, 2x2 in (silver alginate) Discharge Instruction: Apply silver alginate to wound bed as instructed Secondary Dressing Woven Gauze Sponge, Non-Sterile 4x4 in Discharge Instruction: Apply over primary dressing as directed. Secured With Compression Wrap ThreePress (3 layer compression wrap) Discharge Instruction: Apply three layer compression as directed. Compression Stockings Circaid Juxta Lite Compression Wrap Quantity: 1 Right Leg Compression Amount: 30-40 mmHg Discharge Instruction: Apply Circaid Juxta Lite Compression Wrap daily as instructed. Apply first thing in the morning, remove at night before bed. Add-Ons Electronic Signature(s) Signed: 11/17/2020 5:43:52 PM By: [name] RN, BSN Entered By: [name] on 11/17/2020  11:57:10 -------------------------------------------------------------------------------- Wound Assessment Details Patient Name: Date of Service: [name], [name] 11/17/2020 10:45 A M Medical Record Number: [id number] Patient Account Number: [id number] Date of Birth/Sex: Treating RN: 1928-04-08 (87 y.o. [name] Primary Care Holly Pring: [name] Other Clinician: Referring Chantrell Apsey: Treating Cortney Mckinney/Extender: [name] in Treatment: 1 Wound Status Wound Number: 2 Primary Lymphedema Etiology: Wound Location: Right, Medial Lower Leg Wound Open Wounding Event: Gradually Appeared Status: Date Acquired: 11/10/2020 Comorbid Arrhythmia, Coronary Artery Disease, Hypertension, End Weeks Of Treatment: 1 History: Stage Renal Disease Clustered Wound: No Photos Wound Measurements Length: (cm) Width: (cm) Depth: (cm) Area: (cm) Volume: (cm) 0 % Reduction in Area: 100% 0 % Reduction in Volume: 100% 0 Epithelialization: Medium (34-66%) 0 Tunneling: No 0 Undermining: No Wound Description Classification: Full Thickness Without Exposed Support Structures Wound Margin: Distinct, outline attached Exudate Amount: Medium Exudate Type: Serosanguineous Exudate Color: red, brown Foul Odor After Cleansing: No Slough/Fibrino No Wound Bed Granulation Amount: Small (1-33%) Exposed Structure Granulation Quality: Red Fascia Exposed: No Necrotic Amount: Medium (34-66%) Fat Layer (Subcutaneous Tissue) Exposed: Yes Necrotic Quality: Eschar Tendon Exposed: No Muscle Exposed: No Joint Exposed: No Bone Exposed: No Electronic Signature(s) Signed: 11/17/2020 5:43:52 PM By: [name] RN, BSN Entered By: [name] on 11/17/2020 11:57:10 -------------------------------------------------------------------------------- Wound Assessment Details Patient Name: Date of Service: [name] 11/17/2020 10:45 A M Medical Record Number:  [id number] Patient Account Number: [id number] Date of Birth/Sex: Treating RN: 01-Oct-1928 (87 y.o. Ulyses Amor, Vaughan Basta Primary Care Jamesrobert Ohanesian: [name] Other Clinician: Referring Monnica Saltsman: Treating Locke Barrell/Extender: [name] in Treatment: 1 Wound Status Wound Number: 3 Primary Abrasion Etiology: Wound Location: Left, Anterior Lower Leg Wound Open Wounding Event: Trauma Status: Date Acquired: 11/17/2020 Comorbid Arrhythmia, Coronary Artery Disease, Hypertension, End Weeks Of Treatment: 0 History: Stage Renal Disease Clustered Wound: No Wound Measurements Length: (cm) 2 Width: (cm) 0.3 Depth: (cm) 0.1 Area: (cm) 0.471 Volume: (cm)  0.047 % Reduction in Area: % Reduction in Volume: Epithelialization: Small (1-33%) Tunneling: No Undermining: No Wound Description Classification: Full Thickness Without Exposed Support Structures Wound Margin: Flat and Intact Exudate Amount: None Present Foul Odor After Cleansing: No Slough/Fibrino No Wound Bed Granulation Amount: None Present (0%) Exposed Structure Necrotic Amount: None Present (0%) Fascia Exposed: No Fat Layer (Subcutaneous Tissue) Exposed: Yes Tendon Exposed: No Muscle Exposed: No Joint Exposed: No Bone Exposed: No Assessment Notes scabbed Treatment Notes Wound #3 (Lower Leg) Wound Laterality: Left, Anterior Cleanser Peri-Wound Care Sween Lotion (Moisturizing lotion) Discharge Instruction: Apply moisturizing lotion as directed Topical Primary Dressing KerraCel Ag Gelling Fiber Dressing, 2x2 in (silver alginate) Discharge Instruction: Apply silver alginate to wound bed as instructed Secondary Dressing Woven Gauze Sponge, Non-Sterile 4x4 in Discharge Instruction: Apply over primary dressing as directed. Secured With Compression Wrap ThreePress (3 layer compression wrap) Discharge Instruction: Apply three layer compression as directed. Compression Stockings Circaid Juxta  Lite Compression Wrap Quantity: 1 Left Leg Compression Amount: 30-40 mmHg Discharge Instruction: Apply Circaid Juxta Lite Compression Wrap daily as instructed. Apply first thing in the morning, remove at night before bed. Add-Ons Electronic Signature(s) Signed: 11/17/2020 5:43:52 PM By: [name] RN, BSN Entered By: [name] on 11/17/2020 12:00:47 -------------------------------------------------------------------------------- Vitals Details Patient Name: Date of Service: [name]. 11/17/2020 10:45 A M Medical Record Number: [id number] Patient Account Number: [id number] Date of Birth/Sex: Treating RN: May 30, 1928 (87 y.o. [name] Primary Care Onedia Vargus: [name] Other Clinician: Referring Meilani Edmundson: Treating Lametria Klunk/Extender: [name] in Treatment: 1 Vital Signs Time Taken: 11:09 Temperature (F): 98.2 Height (in): 72 Pulse (bpm): 71 Weight (lbs): 190 Respiratory Rate (breaths/min): 18 Body Mass Index (BMI): 25.8 Blood Pressure (mmHg): 180/73 Reference Range: 80 - 120 mg / dl Electronic Signature(s) Signed: 11/17/2020 5:07:25 PM By: Lorrin Jackson Entered By: Lorrin Jackson on 11/17/2020 11:10:09

## 2020-11-17 NOTE — Progress Notes (Addendum)
PENIEL, HASS ([id number]) Visit Report for 11/17/2020 Chief Complaint Document Details Patient Name: Date of Service: [name], [name] 11/17/2020 10:45 A M Medical Record Number: [id number] Patient Account Number: [id number] Date of Birth/Sex: Treating RN: 1928-03-08 (87 y.o. [name] Primary Care Provider: [name] Other Clinician: Referring Provider: Treating Provider/Extender: [name] in Treatment: 1 Information Obtained from: Patient Chief Complaint Bilateral LE Ulcers Electronic Signature(s) Signed: 11/17/2020 12:34:32 PM By: Worthy Keeler PA-C Previous Signature: 11/17/2020 11:53:08 AM Version By: Worthy Keeler PA-C Entered By: Worthy Keeler on 11/17/2020 12:34:32 -------------------------------------------------------------------------------- HPI Details Patient Name: Date of Service: [name] 11/17/2020 10:45 A M Medical Record Number: [id number] Patient Account Number: [id number] Date of Birth/Sex: Treating RN: 1928/05/21 (87 y.o. [name] Primary Care Provider: [name] Other Clinician: Referring Provider: Treating Provider/Extender: [name] in Treatment: 1 History of Present Illness HPI Description: 11/10/2020 upon evaluation today patient presents for initial evaluation and inspection here in the clinic concerning a wound that he has over the right lower extremity. He has been tolerating the dressing changes currently at home they have been using just more antibiotic ointment and what they could in general in that regard. With that being said the patient does have a history of having frequent issues with this over the past 2 years intermittently. 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 He also has previously had veins taken from his right leg to provide a harvest material for the coronary artery bypass surgery. Subsequently this is also the leg that seems to swell the most understandably. 11/17/2020 upon evaluation today patient appears to be doing well with regard to his right leg. He is very close to complete resolution here. Unfortunately he has some areas on his left leg which have arisen over the past week. I think this is due to his swelling and I think if we get the swelling under control this will be significantly improved in general. At this point discussed with patient and his daughter today. Electronic Signature(s) Signed: 11/17/2020 12:34:41 PM By: Worthy Keeler PA-C Previous Signature: 11/17/2020 12:33:08 PM Version By: Worthy Keeler PA-C Entered By: Worthy Keeler on 11/17/2020 12:34:41 -------------------------------------------------------------------------------- Physical Exam Details Patient Name: Date of Service: [name], [name] 11/17/2020 10:45 A M Medical Record Number: [id number] Patient Account Number: [id number] Date of Birth/Sex: Treating RN: 1928-11-06 (87 y.o. [name] Primary Care Provider: [name] Other Clinician: Referring Provider: Treating Provider/Extender: [name] in Treatment: 1 Constitutional Well-nourished and well-hydrated in no acute distress. Respiratory normal breathing without difficulty. Psychiatric this patient is able to make decisions and demonstrates good insight into disease process. Alert and Oriented x 3. pleasant and cooperative. Notes Upon inspection patient's wound bed actually showed signs of good granulation epithelization at this point. Fortunately there does not appear to be any evidence of infection and in fact I think he is healing quite nicely The wrap actually did a great job here. I think that we  will get a need to probably wrap the left leg as well based on what we are seeing today. Electronic Signature(s) Signed: 11/17/2020 12:34:54 PM By: Worthy Keeler PA-C Entered By: Worthy Keeler on 11/17/2020 12:34:53 -------------------------------------------------------------------------------- Physician Orders Details  Patient Name: Date of Service: [name], [name] 11/17/2020 10:45 A M Medical Record Number: [id number] Patient Account Number: [id number] Date of Birth/Sex: Treating RN: [birth year] (87 y.o. [name] Primary Care Provider: [name] Other Clinician: Referring Provider: Treating Provider/Extender: [name] in Treatment: 1 Verbal / Phone Orders: No Diagnosis Coding ICD-10 Coding Code Description I87.2 Venous insufficiency (chronic) (peripheral) I89.0 Lymphedema, not elsewhere classified L97.812 Non-pressure chronic ulcer of other part of right lower leg with fat layer exposed I25.10 Atherosclerotic heart disease of native coronary artery without angina pectoris I10 Essential (primary) hypertension I48.0 Paroxysmal atrial fibrillation Z79.01 Long term (current) use of anticoagulants Follow-up Appointments Return Appointment in 1 week. Bathing/ Shower/ Hygiene May shower with protection but do not get wound dressing(s) wet. - can be purchased at [facility] or [facility] Edema Control - Lymphedema / SCD / Other Bilateral Lower Extremities Elevate legs to the level of the heart or above for 30 minutes daily and/or when sitting, a frequency of: - throughout the day Avoid standing for long periods of time. Exercise regularly Wound Treatment Wound #1 - Lower Leg Wound Laterality: Right, Anterior Peri-Wound Care: Sween Lotion (Moisturizing lotion) 1 x Per Week/15 Days Discharge Instructions: Apply moisturizing lotion as directed Prim Dressing: KerraCel Ag Gelling Fiber Dressing, 2x2 in (silver alginate) 1 x Per Week/15  Days ary Discharge Instructions: Apply silver alginate to wound bed as instructed Secondary Dressing: Woven Gauze Sponge, Non-Sterile 4x4 in 1 x Per Week/15 Days Discharge Instructions: Apply over primary dressing as directed. Compression Wrap: ThreePress (3 layer compression wrap) 1 x Per Week/15 Days Discharge Instructions: Apply three layer compression as directed. Compression Stockings: Circaid Juxta Lite Compression Wrap (DME) Right Leg Compression Amount: 30-40 mmHG Discharge Instructions: Apply Circaid Juxta Lite Compression Wrap daily as instructed. Apply first thing in the morning, remove at night before bed. Wound #3 - Lower Leg Wound Laterality: Left, Anterior Peri-Wound Care: Sween Lotion (Moisturizing lotion) 1 x Per Week/15 Days Discharge Instructions: Apply moisturizing lotion as directed Prim Dressing: KerraCel Ag Gelling Fiber Dressing, 2x2 in (silver alginate) 1 x Per Week/15 Days ary Discharge Instructions: Apply silver alginate to wound bed as instructed Secondary Dressing: Woven Gauze Sponge, Non-Sterile 4x4 in 1 x Per Week/15 Days Discharge Instructions: Apply over primary dressing as directed. Compression Wrap: ThreePress (3 layer compression wrap) 1 x Per Week/15 Days Discharge Instructions: Apply three layer compression as directed. Compression Stockings: Circaid Juxta Lite Compression Wrap (DME) Left Leg Compression Amount: 30-40 mmHG Discharge Instructions: Apply Circaid Juxta Lite Compression Wrap daily as instructed. Apply first thing in the morning, remove at night before bed. Electronic Signature(s) Signed: 11/25/2020 6:02:00 PM By: [name] RN, BSN Signed: 11/26/2020 4:42:47 PM By: Worthy Keeler PA-C Previous Signature: 11/17/2020 5:43:52 PM Version By: [name] RN, BSN Previous Signature: 11/18/2020 2:21:00 PM Version By: Worthy Keeler PA-C Entered By: [name] on 11/24/2020  13:38:46 -------------------------------------------------------------------------------- Problem List Details Patient Name: Date of Service: [name], [name] 11/17/2020 10:45 A M Medical Record Number: [id number] Patient Account Number: [id number] Date of Birth/Sex: Treating RN: 08/20/1928 (87 y.o. [name] Primary Care Provider: [name] Other Clinician: Referring Provider: Treating Provider/Extender: [name] in Treatment: 1 Active Problems ICD-10 Encounter Code Description Active Date MDM Diagnosis I87.2 Venous insufficiency (chronic) (peripheral) 11/10/2020 No Yes I89.0 Lymphedema, not elsewhere classified 11/10/2020 No Yes L97.812 Non-pressure chronic ulcer of other part of right lower leg with fat layer 11/10/2020 No  Yes exposed L97.822 Non-pressure chronic ulcer of other part of left lower leg with fat layer exposed9/21/2022 No Yes I25.10 Atherosclerotic heart disease of native coronary artery without angina pectoris 11/10/2020 No Yes I10 Essential (primary) hypertension 11/10/2020 No Yes I48.0 Paroxysmal atrial fibrillation 11/10/2020 No Yes Z79.01 Long term (current) use of anticoagulants 11/10/2020 No Yes Inactive Problems Resolved Problems Electronic Signature(s) Signed: 11/17/2020 12:34:15 PM By: Worthy Keeler PA-C Previous Signature: 11/17/2020 11:53:02 AM Version By: Worthy Keeler PA-C Entered By: Worthy Keeler on 11/17/2020 12:34:15 -------------------------------------------------------------------------------- Progress Note Details Patient Name: Date of Service: [name]. 11/17/2020 10:45 A M Medical Record Number: [id number] Patient Account Number: [id number] Date of Birth/Sex: Treating RN: Jul 28, 1928 (87 y.o. [name] Primary Care Provider: [name] Other Clinician: Referring Provider: Treating Provider/Extender: [name] in Treatment: 1 Subjective Chief  Complaint Information obtained from Patient Bilateral LE Ulcers History of Present Illness (HPI) 11/10/2020 upon evaluation today patient presents for initial evaluation and inspection here in the clinic concerning a wound that he has over the right lower extremity. He has been tolerating the dressing changes currently at home they have been using just more antibiotic ointment and what they could in general in that regard. With that being said the patient does have a history of having frequent issues with this over the past 2 years intermittently. He has chronic venous insufficiency, lymphedema, coronary artery disease, hypertension, paroxysmal atrial fibrillation, long-term use of anticoagulants due to his history of stroke as well where he does have residual right-sided weakness following the cerebrovascular accident. He also has previously had veins taken from his right leg to provide a harvest material for the coronary artery bypass surgery. Subsequently this is also the leg that seems to swell the most understandably. 11/17/2020 upon evaluation today patient appears to be doing well with regard to his right leg. He is very close to complete resolution here. Unfortunately he has some areas on his left leg which have arisen over the past week. I think this is due to his swelling and I think if we get the swelling under control this will be significantly improved in general. At this point discussed with patient and his daughter today. Objective Constitutional Well-nourished and well-hydrated in no acute distress. Vitals Time Taken: 11:09 AM, Height: 72 in, Weight: 190 lbs, BMI: 25.8, Temperature: 98.2 F, Pulse: 71 bpm, Respiratory Rate: 18 breaths/min, Blood Pressure: 180/73 mmHg. Respiratory normal breathing without difficulty. Psychiatric this patient is able to make decisions and demonstrates good insight into disease process. Alert and Oriented x 3. pleasant and cooperative. General  Notes: Upon inspection patient's wound bed actually showed signs of good granulation epithelization at this point. Fortunately there does not appear to be any evidence of infection and in fact I think he is healing quite nicely The wrap actually did a great job here. I think that we will get a need to probably wrap the left leg as well based on what we are seeing today. Integumentary (Hair, Skin) Wound #1 status is Open. Original cause of wound was Gradually Appeared. The date acquired was: 11/10/2020. The wound has been in treatment 1 weeks. The wound is located on the Right,Anterior Lower Leg. The wound measures 0.1cm length x 0.1cm width x 0.1cm depth; 0.008cm^2 area and 0.001cm^3 volume. There is Fat Layer (Subcutaneous Tissue) exposed. There is no tunneling or undermining noted. There is a medium amount of serosanguineous drainage noted. The wound margin is distinct with  the outline attached to the wound base. There is large (67-100%) pink granulation within the wound bed. There is no necrotic tissue within the wound bed. Wound #2 status is Open. Original cause of wound was Gradually Appeared. The date acquired was: 11/10/2020. The wound has been in treatment 1 weeks. The wound is located on the Right,Medial Lower Leg. The wound measures 0cm length x 0cm width x 0cm depth; 0cm^2 area and 0cm^3 volume. There is Fat Layer (Subcutaneous Tissue) exposed. There is no tunneling or undermining noted. There is a medium amount of serosanguineous drainage noted. The wound margin is distinct with the outline attached to the wound base. There is small (1-33%) red granulation within the wound bed. There is a medium (34-66%) amount of necrotic tissue within the wound bed including Eschar. Wound #3 status is Open. Original cause of wound was Trauma. The date acquired was: 11/17/2020. The wound is located on the Left,Anterior Lower Leg. The wound measures 2cm length x 0.3cm width x 0.1cm depth; 0.471cm^2 area and  0.047cm^3 volume. There is Fat Layer (Subcutaneous Tissue) exposed. There is no tunneling or undermining noted. There is a none present amount of drainage noted. The wound margin is flat and intact. There is no granulation within the wound bed. There is no necrotic tissue within the wound bed. General Notes: scabbed Assessment Active Problems ICD-10 Venous insufficiency (chronic) (peripheral) Lymphedema, not elsewhere classified Non-pressure chronic ulcer of other part of right lower leg with fat layer exposed Non-pressure chronic ulcer of other part of left lower leg with fat layer exposed Atherosclerotic heart disease of native coronary artery without angina pectoris Essential (primary) hypertension Paroxysmal atrial fibrillation Long term (current) use of anticoagulants Procedures Wound #1 Pre-procedure diagnosis of Wound #1 is a Lymphedema located on the Right,Anterior Lower Leg . There was a Three Layer Compression Therapy Procedure by [name], RN. Post procedure Diagnosis Wound #1: Same as Pre-Procedure Wound #3 Pre-procedure diagnosis of Wound #3 is an Abrasion located on the Left,Anterior Lower Leg . There was a Three Layer Compression Therapy Procedure by [name], RN. Post procedure Diagnosis Wound #3: Same as Pre-Procedure Plan Follow-up Appointments: Return Appointment in 1 week. Bathing/ Shower/ Hygiene: May shower with protection but do not get wound dressing(s) wet. - can be purchased at [facility] Edema Control - Lymphedema / SCD / Other: Elevate legs to the level of the heart or above for 30 minutes daily and/or when sitting, a frequency of: - throughout the day Avoid standing for long periods of time. Exercise regularly WOUND #1: - Lower Leg Wound Laterality: Right, Anterior Peri-Wound Care: Sween Lotion (Moisturizing lotion) 1 x Per Week/15 Days Discharge Instructions: Apply moisturizing lotion as directed Prim Dressing: KerraCel Ag Gelling  Fiber Dressing, 2x2 in (silver alginate) 1 x Per Week/15 Days ary Discharge Instructions: Apply silver alginate to wound bed as instructed Secondary Dressing: Woven Gauze Sponge, Non-Sterile 4x4 in 1 x Per Week/15 Days Discharge Instructions: Apply over primary dressing as directed. Com pression Wrap: ThreePress (3 layer compression wrap) 1 x Per Week/15 Days Discharge Instructions: Apply three layer compression as directed. Com pression Stockings: Circaid Juxta Lite Compression Wrap (DME) Compression Amount: 30-40 mmHg (right) Discharge Instructions: Apply Circaid Juxta Lite Compression Wrap daily as instructed. Apply first thing in the morning, remove at night before bed. WOUND #3: - Lower Leg Wound Laterality: Left, Anterior Peri-Wound Care: Sween Lotion (Moisturizing lotion) 1 x Per Week/15 Days Discharge Instructions: Apply moisturizing lotion as directed Prim Dressing: Paulina Fusi  Ag Gelling Fiber Dressing, 2x2 in (silver alginate) 1 x Per Week/15 Days ary Discharge Instructions: Apply silver alginate to wound bed as instructed Secondary Dressing: Woven Gauze Sponge, Non-Sterile 4x4 in 1 x Per Week/15 Days Discharge Instructions: Apply over primary dressing as directed. Com pression Wrap: ThreePress (3 layer compression wrap) 1 x Per Week/15 Days Discharge Instructions: Apply three layer compression as directed. Com pression Stockings: Circaid Juxta Lite Compression Wrap (DME) Compression Amount: 30-40 mmHg (left) Discharge Instructions: Apply Circaid Juxta Lite Compression Wrap daily as instructed. Apply first thing in the morning, remove at night before bed. 1. My suggestion currently is good to be that we go ahead and continue with the compression wrapping. This is a 3 layer compression wrap bilaterally. 2. I am also going to recommend that we have the patient continue to monitor for any signs of worsening or infection. Obviously if he has any evidence of a significant issue here he  should let me know soon as possible with that being said right now I think that the biggest issue is simply that he needs good compression. We will get a see about ordering Velcro compression wraps. We will see patient back for reevaluation in 1 week here in the clinic. If anything worsens or changes patient will contact our office for additional recommendations. Electronic Signature(s) Signed: 11/25/2020 6:02:00 PM By: [name] RN, BSN Signed: 11/26/2020 4:42:47 PM By: Worthy Keeler PA-C Previous Signature: 11/17/2020 5:43:52 PM Version By: [name] RN, BSN Previous Signature: 11/18/2020 2:21:00 PM Version By: Worthy Keeler PA-C Previous Signature: 11/17/2020 12:36:29 PM Version By: Worthy Keeler PA-C Entered By: [name] on 11/24/2020 13:39:21 -------------------------------------------------------------------------------- SuperBill Details Patient Name: Date of Service: [name], [name] 11/17/2020 Medical Record Number: [id number] Patient Account Number: [id number] Date of Birth/Sex: Treating RN: [birth year] (87 y.o. [name] Primary Care Provider: [name] Other Clinician: Referring Provider: Treating Provider/Extender: [name] in Treatment: 1 Diagnosis Coding ICD-10 Codes Code Description I87.2 Venous insufficiency (chronic) (peripheral) I89.0 Lymphedema, not elsewhere classified L97.812 Non-pressure chronic ulcer of other part of right lower leg with fat layer exposed L97.822 Non-pressure chronic ulcer of other part of left lower leg with fat layer exposed I25.10 Atherosclerotic heart disease of native coronary artery without angina pectoris I10 Essential (primary) hypertension I48.0 Paroxysmal atrial fibrillation Z79.01 Long term (current) use of anticoagulants Facility Procedures Physician Procedures : CPT4 Code Description Modifier [id number] - WC PHYS LEVEL 3 - EST PT ICD-10 Diagnosis Description I87.2  Venous insufficiency (chronic) (peripheral) I89.0 Lymphedema, not elsewhere classified L97.812 Non-pressure chronic ulcer of other part of right  lower leg with fat layer exposed L97.822 Non-pressure chronic ulcer of other part of left lower leg with fat layer exposed Quantity: 1 Electronic Signature(s) Signed: 11/25/2020 6:02:00 PM By: [name] RN, BSN Signed: 11/26/2020 4:42:47 PM By: Worthy Keeler PA-C Previous Signature: 11/17/2020 84:66:59 PM Version By: Worthy Keeler PA-C Entered By: [name] on 11/25/2020 16:08:30

## 2020-11-18 DIAGNOSIS — N2581 Secondary hyperparathyroidism of renal origin: Secondary | ICD-10-CM | POA: Diagnosis not present

## 2020-11-18 DIAGNOSIS — D631 Anemia in chronic kidney disease: Secondary | ICD-10-CM | POA: Diagnosis not present

## 2020-11-18 DIAGNOSIS — I129 Hypertensive chronic kidney disease with stage 1 through stage 4 chronic kidney disease, or unspecified chronic kidney disease: Secondary | ICD-10-CM | POA: Diagnosis not present

## 2020-11-18 DIAGNOSIS — N184 Chronic kidney disease, stage 4 (severe): Secondary | ICD-10-CM | POA: Diagnosis not present

## 2020-11-18 DIAGNOSIS — N281 Cyst of kidney, acquired: Secondary | ICD-10-CM | POA: Diagnosis not present

## 2020-11-18 DIAGNOSIS — N189 Chronic kidney disease, unspecified: Secondary | ICD-10-CM | POA: Diagnosis not present

## 2020-11-22 ENCOUNTER — Telehealth: Payer: Self-pay | Admitting: Family Medicine

## 2020-11-22 NOTE — Telephone Encounter (Signed)
Spoke with daughter Dolores Lory to schedule awv She stated patient does not talk and didn't want to schedule

## 2020-11-24 ENCOUNTER — Other Ambulatory Visit: Payer: Self-pay

## 2020-11-24 ENCOUNTER — Encounter (HOSPITAL_BASED_OUTPATIENT_CLINIC_OR_DEPARTMENT_OTHER): Payer: Medicare Other | Admitting: Physician Assistant

## 2020-11-24 DIAGNOSIS — L97822 Non-pressure chronic ulcer of other part of left lower leg with fat layer exposed: Secondary | ICD-10-CM | POA: Diagnosis not present

## 2020-11-24 DIAGNOSIS — I872 Venous insufficiency (chronic) (peripheral): Secondary | ICD-10-CM | POA: Diagnosis not present

## 2020-11-24 DIAGNOSIS — I480 Paroxysmal atrial fibrillation: Secondary | ICD-10-CM | POA: Diagnosis not present

## 2020-11-24 DIAGNOSIS — I10 Essential (primary) hypertension: Secondary | ICD-10-CM | POA: Diagnosis not present

## 2020-11-24 DIAGNOSIS — Z7901 Long term (current) use of anticoagulants: Secondary | ICD-10-CM | POA: Diagnosis not present

## 2020-11-24 DIAGNOSIS — I890 Lymphedema, not elsewhere classified: Secondary | ICD-10-CM | POA: Diagnosis not present

## 2020-11-24 DIAGNOSIS — I2510 Atherosclerotic heart disease of native coronary artery without angina pectoris: Secondary | ICD-10-CM | POA: Diagnosis not present

## 2020-11-24 DIAGNOSIS — Z951 Presence of aortocoronary bypass graft: Secondary | ICD-10-CM | POA: Diagnosis not present

## 2020-11-24 DIAGNOSIS — I69351 Hemiplegia and hemiparesis following cerebral infarction affecting right dominant side: Secondary | ICD-10-CM | POA: Diagnosis not present

## 2020-11-24 DIAGNOSIS — L97812 Non-pressure chronic ulcer of other part of right lower leg with fat layer exposed: Secondary | ICD-10-CM | POA: Diagnosis not present

## 2020-11-24 NOTE — Progress Notes (Signed)
[name], [name] ([id number]) Visit Report for 11/24/2020 Arrival Information Details Patient Name: Date of Service: [name], [name] 11/24/2020 1:15 PM Medical Record Number: [id number] Patient Account Number: [id number] Date of Birth/Sex: Treating RN: [birth year] (87 y.o. [name] Primary Care [name]: [name] Other Clinician: Referring [name]: Treating [name]/Extender: [name] in Treatment: 2 Visit Information History Since Last Visit Added or deleted any medications: Yes Patient Arrived: Cane Any new allergies or adverse reactions: No Arrival Time: 13:11 Had a fall or experienced change in No Accompanied By: daughter activities of daily living that may affect Transfer Assistance: None risk of falls: Patient Identification Verified: Yes Signs or symptoms of abuse/neglect since last visito No Secondary Verification Process Completed: Yes Hospitalized since last visit: No Patient Requires Transmission-Based Precautions: No Implantable device outside of the clinic excluding No Patient Has Alerts: Yes cellular tissue based products placed in the center Patient Alerts: Patient on Blood Thinner since last visit: Has Dressing in Place as Prescribed: Yes Has Compression in Place as Prescribed: Yes Pain Present Now: No Electronic Signature(s) Signed: 11/24/2020 5:46:28 PM By: [name] RN, BSN Entered By: [name] on 11/24/2020 13:16:07 -------------------------------------------------------------------------------- Clinic Level of Care Assessment Details Patient Name: Date of Service: [name], [name] 11/24/2020 1:15 PM Medical Record Number: [id number] Patient Account Number: [id number] Date of Birth/Sex: Treating RN: 1929/02/19 (87 y.o. [name] Primary Care [name]: [name] Other Clinician: Referring [name]: Treating [name]/Extender: [name] in Treatment:  2 Clinic Level of Care Assessment Items TOOL 4 Quantity Score []  - 0 Use when only an EandM is performed on FOLLOW-UP visit ASSESSMENTS - Nursing Assessment / Reassessment X- 1 10 Reassessment of Co-morbidities (includes updates in patient status) X- 1 5 Reassessment of Adherence to Treatment Plan ASSESSMENTS - Wound and Skin A ssessment / Reassessment []  - 0 Simple Wound Assessment / Reassessment - one wound X- 2 5 Complex Wound Assessment / Reassessment - multiple wounds []  - 0 Dermatologic / Skin Assessment (not related to wound area) ASSESSMENTS - Focused Assessment X- 2 5 Circumferential Edema Measurements - multi extremities []  - 0 Nutritional Assessment / Counseling / Intervention X- 1 5 Lower Extremity Assessment (monofilament, tuning fork, pulses) []  - 0 Peripheral Arterial Disease Assessment (using hand held doppler) ASSESSMENTS - Ostomy and/or Continence Assessment and Care []  - 0 Incontinence Assessment and Management []  - 0 Ostomy Care Assessment and Management (repouching, etc.) PROCESS - Coordination of Care X - Simple Patient / Family Education for ongoing care 1 15 []  - 0 Complex (extensive) Patient / Family Education for ongoing care X- 1 10 Staff obtains Programmer, systems, Records, T Results / Process Orders est []  - 0 Staff telephones HHA, Nursing Homes / Clarify orders / etc []  - 0 Routine Transfer to another Facility (non-emergent condition) []  - 0 Routine Hospital Admission (non-emergent condition) []  - 0 New Admissions / Biomedical engineer / Ordering NPWT Apligraf, etc. , []  - 0 Emergency Hospital Admission (emergent condition) X- 1 10 Simple Discharge Coordination []  - 0 Complex (extensive) Discharge Coordination PROCESS - Special Needs []  - 0 Pediatric / Minor Patient Management []  - 0 Isolation Patient Management []  - 0 Hearing / Language / Visual special needs []  - 0 Assessment of Community assistance (transportation, D/C planning,  etc.) []  - 0 Additional assistance / Altered mentation []  - 0 Support Surface(s) Assessment (bed, cushion, seat, etc.) INTERVENTIONS - Wound Cleansing / Measurement []  - 0 Simple Wound  Cleansing - one wound X- 2 5 Complex Wound Cleansing - multiple wounds X- 1 5 Wound Imaging (photographs - any number of wounds) []  - 0 Wound Tracing (instead of photographs) []  - 0 Simple Wound Measurement - one wound X- 2 5 Complex Wound Measurement - multiple wounds INTERVENTIONS - Wound Dressings []  - 0 Small Wound Dressing one or multiple wounds []  - 0 Medium Wound Dressing one or multiple wounds []  - 0 Large Wound Dressing one or multiple wounds []  - 0 Application of Medications - topical []  - 0 Application of Medications - injection INTERVENTIONS - Miscellaneous []  - 0 External ear exam []  - 0 Specimen Collection (cultures, biopsies, blood, body fluids, etc.) []  - 0 Specimen(s) / Culture(s) sent or taken to Lab for analysis []  - 0 Patient Transfer (multiple staff / Civil Service fast streamer / Similar devices) []  - 0 Simple Staple / Suture removal (25 or less) []  - 0 Complex Staple / Suture removal (26 or more) []  - 0 Hypo / Hyperglycemic Management (close monitor of Blood Glucose) []  - 0 Ankle / Brachial Index (ABI) - do not check if billed separately X- 1 5 Vital Signs Has the patient been seen at the hospital within the last three years: Yes Total Score: 105 Level Of Care: New/Established - Level 3 Electronic Signature(s) Signed: 11/24/2020 5:46:28 PM By: [name] RN, BSN Entered By: [name] on 11/24/2020 13:52:45 -------------------------------------------------------------------------------- Encounter Discharge Information Details Patient Name: Date of Service: [name] 11/24/2020 1:15 PM Medical Record Number: [id number] Patient Account Number: [id number] Date of Birth/Sex: Treating RN: 09/08/1928 (87 y.o. [name] Primary Care Adonte Vanriper: [name] Other Clinician: Referring Christifer Chapdelaine: Treating Ariadne Rissmiller/Extender: [name] in Treatment: 2 Encounter Discharge Information Items Discharge Condition: Stable Ambulatory Status: Cane Discharge Destination: Home Transportation: Private Auto Accompanied By: daughter Schedule Follow-up Appointment: Yes Clinical Summary of Care: Patient Declined Electronic Signature(s) Signed: 11/24/2020 5:46:28 PM By: [name] RN, BSN Entered By: [name] on 11/24/2020 14:03:51 -------------------------------------------------------------------------------- Lower Extremity Assessment Details Patient Name: Date of Service: [name], [name] 11/24/2020 1:15 PM Medical Record Number: [id number] Patient Account Number: [id number] Date of Birth/Sex: Treating RN: [birth year] (87 y.o. [name] Primary Care Leather Estis: [name] Other Clinician: Referring Daylah Sayavong: Treating Mahealani Sulak/Extender: [name] in Treatment: 2 Edema Assessment Assessed: [Left: No] [Right: No] Edema: [Left: Yes] [Right: Yes] Calf Left: Right: Point of Measurement: 35 cm From Medial Instep 29 cm 33 cm Ankle Left: Right: Point of Measurement: 12 cm From Medial Instep 21.5 cm 23.5 cm Vascular Assessment Pulses: Dorsalis Pedis Palpable: [Left:No] [Right:No] Electronic Signature(s) Signed: 11/24/2020 5:46:28 PM By: [name] RN, BSN Entered By: [name] on 11/24/2020 13:28:22 -------------------------------------------------------------------------------- Multi-Disciplinary Care Plan Details Patient Name: Date of Service: [name], [name] 11/24/2020 1:15 PM Medical Record Number: [id number] Patient Account Number: [id number] Date of Birth/Sex: Treating RN: Jan 23, 1929 (87 y.o. [name] Primary Care Klinton Candelas: [name] Other Clinician: Referring Rayquon Uselman: Treating Ramiyah Mcclenahan/Extender: [name] in Treatment: 2 [place] reviewed with physician Active Inactive Electronic Signature(s) Signed: 11/24/2020 5:46:28 PM By: [name] RN, BSN Entered By: [name] on 11/24/2020 13:33:16 -------------------------------------------------------------------------------- Pain Assessment Details Patient Name: Date of Service: [name], [name] 11/24/2020 1:15 PM Medical Record Number: [id number] Patient Account Number: [id number] Date of Birth/Sex: Treating RN: [birth year] (87 y.o. [name] Primary Care Oswaldo Cueto: [name] Other Clinician: Referring Taylen Wendland: Treating Fawn Desrocher/Extender: Joaquim Lai  III, Warner Mccreedy, Annie Main Weeks in Treatment: 2 Active Problems Location of Pain Severity and Description of Pain Patient Has Paino No Site Locations Rate the pain. Rate the pain. Current Pain Level: 0 Pain Management and Medication Current Pain Management: Electronic Signature(s) Signed: 11/24/2020 5:46:28 PM By: [name] RN, BSN Entered By: [name] on 11/24/2020 13:18:10 -------------------------------------------------------------------------------- Patient/Caregiver Education Details Patient Name: Date of Service: [name] 9/28/2022andnbsp1:15 PM Medical Record Number: [id number] Patient Account Number: [id number] Date of Birth/Gender: Treating RN: 1928/12/28 (87 y.o. [name] Primary Care Physician: [name] Other Clinician: Referring Physician: Treating Physician/Extender: [name] in Treatment: 2 Education Assessment Education Provided To: Patient Education Topics Provided Venous: Methods: Explain/Verbal Responses: Reinforcements needed, State content correctly Wound/Skin Impairment: Methods: Explain/Verbal Responses: Reinforcements needed, State content correctly Electronic Signature(s) Signed: 11/24/2020 5:46:28 PM By: [name] RN,  BSN Entered By: [name] on 11/24/2020 13:33:34 -------------------------------------------------------------------------------- Wound Assessment Details Patient Name: Date of Service: [name], [name] 11/24/2020 1:15 PM Medical Record Number: [id number] Patient Account Number: [id number] Date of Birth/Sex: Treating RN: [birth year] (87 y.o. [name] Primary Care Gustavus Haskin: [name] Other Clinician: Referring Aradhya Shellenbarger: Treating Kailynne Ferrington/Extender: [name] in Treatment: 2 Wound Status Wound Number: 1 Primary Lymphedema Etiology: Wound Location: Right, Anterior Lower Leg Wound Open Wounding Event: Gradually Appeared Status: Date Acquired: 11/10/2020 Comorbid Arrhythmia, Coronary Artery Disease, Hypertension, End Weeks Of Treatment: 2 History: Stage Renal Disease Clustered Wound: No Photos Wound Measurements Length: (cm) Width: (cm) Depth: (cm) Area: (cm) Volume: (cm) 0 % Reduction in Area: 100% 0 % Reduction in Volume: 100% 0 Epithelialization: Large (67-100%) 0 Tunneling: No 0 Undermining: No Wound Description Classification: Full Thickness Without Exposed Support Structures Exudate Amount: None Present Foul Odor After Cleansing: No Slough/Fibrino No Wound Bed Granulation Amount: None Present (0%) Exposed Structure Necrotic Amount: None Present (0%) Fascia Exposed: No Fat Layer (Subcutaneous Tissue) Exposed: No Tendon Exposed: No Muscle Exposed: No Joint Exposed: No Bone Exposed: No Electronic Signature(s) Signed: 11/24/2020 5:46:28 PM By: [name] RN, BSN Entered By: [name] on 11/24/2020 13:31:03 -------------------------------------------------------------------------------- Wound Assessment Details Patient Name: Date of Service: [name] 11/24/2020 1:15 PM Medical Record Number: [id number] Patient Account Number: [id number] Date of Birth/Sex: Treating RN: 1928-09-21 (87 y.o. [name] Primary Care Geddy Boydstun: [name] Other Clinician: Referring Adi Seales: Treating Zamyah Wiesman/Extender: [name] in Treatment: 2 Wound Status Wound Number: 3 Primary Abrasion Etiology: Wound Location: Left, Anterior Lower Leg Wound Open Wounding Event: Trauma Status: Date Acquired: 11/17/2020 Comorbid Arrhythmia, Coronary Artery Disease, Hypertension, End Weeks Of Treatment: 1 History: Stage Renal Disease Clustered Wound: No Photos Wound Measurements Length: (cm) Width: (cm) Depth: (cm) Area: (cm) Volume: (cm) 0 % Reduction in Area: 100% 0 % Reduction in Volume: 100% 0 Epithelialization: Large (67-100%) 0 Tunneling: No 0 Undermining: No Wound Description Classification: Full Thickness Without Exposed Support Structures Wound Margin: Flat and Intact Exudate Amount: None Present Foul Odor After Cleansing: No Slough/Fibrino No Wound Bed Granulation Amount: None Present (0%) Exposed Structure Necrotic Amount: None Present (0%) Fascia Exposed: No Fat Layer (Subcutaneous Tissue) Exposed: No Tendon Exposed: No Muscle Exposed: No Joint Exposed: No Bone Exposed: No Electronic Signature(s) Signed: 11/24/2020 5:46:28 PM By: [name] RN, BSN Entered By: [name] on 11/24/2020 13:31:31 -------------------------------------------------------------------------------- [place] Details Patient Name: Date of Service: [name] 11/24/2020 1:15 PM Medical Record Number: [id number] Patient Account Number: [id number] Date of  Birth/Sex: Treating RN: [birth year] (87 y.o. [name] Primary Care Gwenetta Devos: [name] Other Clinician: Referring Anjeanette Petzold: Treating Knox Cervi/Extender: [name] in Treatment: 2 Vital Signs Time Taken: 13:17 Temperature (F): 98.6 Height (in): 72 Pulse (bpm): 60 Source: Stated Respiratory Rate (breaths/min): 18 Weight (lbs): 190 Blood  Pressure (mmHg): 185/76 Source: Stated Reference Range: 80 - 120 mg / dl Body Mass Index (BMI): 25.8 Electronic Signature(s) Signed: 11/24/2020 5:46:28 PM By: [name] RN, BSN Entered By: [name] on 11/24/2020 13:18:02

## 2020-11-25 DIAGNOSIS — S81809A Unspecified open wound, unspecified lower leg, initial encounter: Secondary | ICD-10-CM | POA: Diagnosis not present

## 2020-11-25 DIAGNOSIS — I872 Venous insufficiency (chronic) (peripheral): Secondary | ICD-10-CM | POA: Diagnosis not present

## 2020-11-25 NOTE — Progress Notes (Addendum)
[name], [name] ([id number]) Visit Report for 11/24/2020 Chief Complaint Document Details Patient Name: Date of Service: [name], [name] 11/24/2020 1:15 PM Medical Record Number: [id number] Patient Account Number: [id number] Date of Birth/Sex: Treating RN: 07/21/1928 (87 y.o. [name] Primary Care Provider: [name] Other Clinician: Referring Provider: Treating Provider/Extender: [name] in Treatment: 2 Information Obtained from: Patient Chief Complaint Bilateral LE Ulcers Electronic Signature(s) Signed: 11/24/2020 1:37:16 PM By: Worthy Keeler PA-C Entered By: Worthy Keeler on 11/24/2020 13:37:15 -------------------------------------------------------------------------------- HPI Details Patient Name: Date of Service: [name], [name] 11/24/2020 1:15 PM Medical Record Number: [id number] Patient Account Number: [id number] Date of Birth/Sex: Treating RN: March 13, 1928 (87 y.o. [name] Primary Care Provider: [name] Other Clinician: Referring Provider: Treating Provider/Extender: [name] in Treatment: 2 History of Present Illness HPI Description: 11/10/2020 upon evaluation today patient presents for initial evaluation and inspection here in the clinic concerning a wound that he has over the right lower extremity. He has been tolerating the dressing changes currently at home they have been using just more antibiotic ointment and what they could in general in that regard. With that being said the patient does have a history of having frequent issues with this over the past 2 years intermittently. He has chronic venous insufficiency, lymphedema, coronary artery disease, hypertension, paroxysmal atrial fibrillation, long-term use of anticoagulants due to his history of stroke as well where he does have residual right-sided weakness following the cerebrovascular accident. He also has previously had  veins taken from his right leg to provide a harvest material for the coronary artery bypass surgery. Subsequently this is also the leg that seems to swell the most understandably. 11/17/2020 upon evaluation today patient appears to be doing well with regard to his right leg. He is very close to complete resolution here. Unfortunately he has some areas on his left leg which have arisen over the past week. I think this is due to his swelling and I think if we get the swelling under control this will be significantly improved in general. At this point discussed with patient and his daughter today. 11/24/2020 upon evaluation today patient appears to be doing excellent and in fact is completely healed based on what I am seeing today. Unfortunately we have had an issue with getting his juxta light compression although that has been ordered apparently there was a complication with the order. We will get have to follow-up on that. Electronic Signature(s) Signed: 11/26/2020 4:23:22 PM By: Worthy Keeler PA-C Entered By: Worthy Keeler on 11/26/2020 16:23:21 -------------------------------------------------------------------------------- Physical Exam Details Patient Name: Date of Service: [name], [name] 11/24/2020 1:15 PM Medical Record Number: [id number] Patient Account Number: [id number] Date of Birth/Sex: Treating RN: 1928-04-17 (87 y.o. [name] Primary Care Provider: [name] Other Clinician: Referring Provider: Treating Provider/Extender: [name] in Treatment: 2 Constitutional Well-nourished and well-hydrated in no acute distress. Respiratory normal breathing without difficulty. Psychiatric this patient is able to make decisions and demonstrates good insight into disease process. Alert and Oriented x 3. pleasant and cooperative. Notes Patient's wounds again showed signs of complete epithelization which is great news and overall very pleased in  that regard. I do not see any evidence of infection at this time which is great news. No fevers, chills, nausea, vomiting, or diarrhea. Electronic Signature(s) Signed: 11/26/2020 4:23:36 PM By: Worthy Keeler PA-C Entered By: Worthy Keeler on 11/26/2020  16:23:35 -------------------------------------------------------------------------------- Physician Orders Details Patient Name: Date of Service: [name], [name] 11/24/2020 1:15 PM Medical Record Number: [id number] Patient Account Number: [id number] Date of Birth/Sex: Treating RN: [birth year] (87 y.o. [name] Primary Care Provider: [name] Other Clinician: Referring Provider: Treating Provider/Extender: [name] in Treatment: 2 Verbal / Phone Orders: No Diagnosis Coding ICD-10 Coding Code Description I87.2 Venous insufficiency (chronic) (peripheral) I89.0 Lymphedema, not elsewhere classified L97.812 Non-pressure chronic ulcer of other part of right lower leg with fat layer exposed L97.822 Non-pressure chronic ulcer of other part of left lower leg with fat layer exposed I25.10 Atherosclerotic heart disease of native coronary artery without angina pectoris I10 Essential (primary) hypertension I48.0 Paroxysmal atrial fibrillation Z79.01 Long term (current) use of anticoagulants [facility]charge [facility] Discharge from [place] Bathing/ Shower/ Hygiene May shower and wash wound with soap and water. Edema Control - Lymphedema / SCD / Other Bilateral Lower Extremities Elevate legs to the level of the heart or above for 30 minutes daily and/or when sitting, a frequency of: - throughout the day Avoid standing for long periods of time. Exercise regularly Compression stocking or Garment 20-30 mm/Hg pressure to: - compression stockings or juxtalites both legs daily Non Wound Condition pply the following to affected area as directed: - ace wraps to both legs daily util juxtalites  available A Electronic Signature(s) Signed: 11/24/2020 5:46:28 PM By: [name] RN, BSN Signed: 11/26/2020 4:42:31 PM By: Worthy Keeler PA-C Entered By: [name] on 11/24/2020 13:51:29 -------------------------------------------------------------------------------- Problem List Details Patient Name: Date of Service: [name], [name] 11/24/2020 1:15 PM Medical Record Number: [id number] Patient Account Number: [id number] Date of Birth/Sex: Treating RN: 25-Jul-1928 (87 y.o. [name] Primary Care Provider: [name] Other Clinician: Referring Provider: Treating Provider/Extender: [name] in Treatment: 2 Active Problems ICD-10 Encounter Code Description Active Date MDM Diagnosis I87.2 Venous insufficiency (chronic) (peripheral) 11/10/2020 No Yes I89.0 Lymphedema, not elsewhere classified 11/10/2020 No Yes L97.812 Non-pressure chronic ulcer of other part of right lower leg with fat layer 11/10/2020 No Yes exposed L97.822 Non-pressure chronic ulcer of other part of left lower leg with fat layer exposed9/21/2022 No Yes I25.10 Atherosclerotic heart disease of native coronary artery without angina pectoris 11/10/2020 No Yes I10 Essential (primary) hypertension 11/10/2020 No Yes I48.0 Paroxysmal atrial fibrillation 11/10/2020 No Yes Z79.01 Long term (current) use of anticoagulants 11/10/2020 No Yes Inactive Problems Resolved Problems Electronic Signature(s) Signed: 11/24/2020 1:37:00 PM By: Worthy Keeler PA-C Entered By: Worthy Keeler on 11/24/2020 13:36:59 -------------------------------------------------------------------------------- Progress Note Details Patient Name: Date of Service: [name], [name] 11/24/2020 1:15 PM Medical Record Number: [id number] Patient Account Number: [id number] Date of Birth/Sex: Treating RN: [birth year] (87 y.o. [name] Primary Care Provider: [name] Other Clinician: Referring  Provider: Treating Provider/Extender: [name] in Treatment: 2 Subjective Chief Complaint Information obtained from Patient Bilateral LE Ulcers History of Present Illness (HPI) 11/10/2020 upon evaluation today patient presents for initial evaluation and inspection here in the clinic concerning a wound that he has over the right lower extremity. He has been tolerating the dressing changes currently at home they have been using just more antibiotic ointment and what they could in general in that regard. With that being said the patient does have a history of having frequent issues with this over the past 2 years intermittently. He has chronic venous insufficiency, lymphedema, coronary artery disease, hypertension, paroxysmal atrial fibrillation,  long-term use of anticoagulants due to his history of stroke as well where he does have residual right-sided weakness following the cerebrovascular accident. He also has previously had veins taken from his right leg to provide a harvest material for the coronary artery bypass surgery. Subsequently this is also the leg that seems to swell the most understandably. 11/17/2020 upon evaluation today patient appears to be doing well with regard to his right leg. He is very close to complete resolution here. Unfortunately he has some areas on his left leg which have arisen over the past week. I think this is due to his swelling and I think if we get the swelling under control this will be significantly improved in general. At this point discussed with patient and his daughter today. 11/24/2020 upon evaluation today patient appears to be doing excellent and in fact is completely healed based on what I am seeing today. Unfortunately we have had an issue with getting his juxta light compression although that has been ordered apparently there was a complication with the order. We will get have to follow-up on  that. Objective Constitutional Well-nourished and well-hydrated in no acute distress. Vitals Time Taken: 1:17 PM, Height: 72 in, Source: Stated, Weight: 190 lbs, Source: Stated, BMI: 25.8, Temperature: 98.6 F, Pulse: 60 bpm, Respiratory Rate: 18 breaths/min, Blood Pressure: 185/76 mmHg. Respiratory normal breathing without difficulty. Psychiatric this patient is able to make decisions and demonstrates good insight into disease process. Alert and Oriented x 3. pleasant and cooperative. General Notes: Patient's wounds again showed signs of complete epithelization which is great news and overall very pleased in that regard. I do not see any evidence of infection at this time which is great news. No fevers, chills, nausea, vomiting, or diarrhea. Integumentary (Hair, Skin) Wound #1 status is Open. Original cause of wound was Gradually Appeared. The date acquired was: 11/10/2020. The wound has been in treatment 2 weeks. The wound is located on the Right,Anterior Lower Leg. The wound measures 0cm length x 0cm width x 0cm depth; 0cm^2 area and 0cm^3 volume. There is no tunneling or undermining noted. There is a none present amount of drainage noted. There is no granulation within the wound bed. There is no necrotic tissue within the wound bed. Wound #3 status is Open. Original cause of wound was Trauma. The date acquired was: 11/17/2020. The wound has been in treatment 1 weeks. The wound is located on the Left,Anterior Lower Leg. The wound measures 0cm length x 0cm width x 0cm depth; 0cm^2 area and 0cm^3 volume. There is no tunneling or undermining noted. There is a none present amount of drainage noted. The wound margin is flat and intact. There is no granulation within the wound bed. There is no necrotic tissue within the wound bed. Assessment Active Problems ICD-10 Venous insufficiency (chronic) (peripheral) Lymphedema, not elsewhere classified Non-pressure chronic ulcer of other part of right  lower leg with fat layer exposed Non-pressure chronic ulcer of other part of left lower leg with fat layer exposed Atherosclerotic heart disease of native coronary artery without angina pectoris Essential (primary) hypertension Paroxysmal atrial fibrillation Long term (current) use of anticoagulants Plan Discharge From [facility] Services: Discharge from [place] Bathing/ Shower/ Hygiene: May shower and wash wound with soap and water. Edema Control - Lymphedema / SCD / Other: Elevate legs to the level of the heart or above for 30 minutes daily and/or when sitting, a frequency of: - throughout the day Avoid standing for long periods of time.  Exercise regularly Compression stocking or Garment 20-30 mm/Hg pressure to: - compression stockings or juxtalites both legs daily Non Wound Condition: Apply the following to affected area as directed: - ace wraps to both legs daily util juxtalites available 1. Would recommend currently that we going continue with the wound care measures as before and the patient is in agreement with plan. This includes the use of the compression I think Velcro compression wraps will work although so will standard compression socks the biggest thing is is just harder to get those on. Nonetheless I do believe that either way 1 way or another he needs to have compression on his legs daily. 2. I am also can recommend patient should be elevating his legs much as possible to help with edema control. Otherwise if he does not do these 2 things he is getting that back here with additional wounds. We will see the patient back for follow-up visit as needed. Electronic Signature(s) Signed: 11/26/2020 4:24:24 PM By: Worthy Keeler PA-C Entered By: Worthy Keeler on 11/26/2020 16:24:23 -------------------------------------------------------------------------------- SuperBill Details Patient Name: Date of Service: [name], [name] 11/24/2020 Medical Record Number:  [id number] Patient Account Number: [id number] Date of Birth/Sex: Treating RN: 07-Sep-1928 (87 y.o. [name] Primary Care Provider: [name] Other Clinician: Referring Provider: Treating Provider/Extender: [name] in Treatment: 2 Diagnosis Coding ICD-10 Codes Code Description I87.2 Venous insufficiency (chronic) (peripheral) I89.0 Lymphedema, not elsewhere classified L97.812 Non-pressure chronic ulcer of other part of right lower leg with fat layer exposed L97.822 Non-pressure chronic ulcer of other part of left lower leg with fat layer exposed I25.10 Atherosclerotic heart disease of native coronary artery without angina pectoris I10 Essential (primary) hypertension I48.0 Paroxysmal atrial fibrillation Z79.01 Long term (current) use of anticoagulants Facility Procedures CPT4 Code: [id number] Description: 9213 - WOUND CARE VISIT-LEV 3 EST PT Modifier: Quantity: 1 Electronic Signature(s) Signed: 11/24/2020 5:46:28 PM By: [name] RN, BSN Signed: 11/26/2020 4:42:31 PM By: Worthy Keeler PA-C Entered By: [name] on 11/24/2020 14:03:14

## 2020-11-29 DIAGNOSIS — I517 Cardiomegaly: Secondary | ICD-10-CM | POA: Diagnosis not present

## 2020-11-29 DIAGNOSIS — R0989 Other specified symptoms and signs involving the circulatory and respiratory systems: Secondary | ICD-10-CM | POA: Diagnosis not present

## 2020-12-01 DIAGNOSIS — J069 Acute upper respiratory infection, unspecified: Secondary | ICD-10-CM | POA: Diagnosis not present

## 2020-12-01 DIAGNOSIS — J310 Chronic rhinitis: Secondary | ICD-10-CM | POA: Diagnosis not present

## 2020-12-01 DIAGNOSIS — J029 Acute pharyngitis, unspecified: Secondary | ICD-10-CM | POA: Diagnosis not present

## 2020-12-02 ENCOUNTER — Other Ambulatory Visit: Payer: Self-pay | Admitting: Nurse Practitioner

## 2020-12-09 ENCOUNTER — Other Ambulatory Visit: Payer: Self-pay | Admitting: Family Medicine

## 2020-12-09 DIAGNOSIS — Z0000 Encounter for general adult medical examination without abnormal findings: Secondary | ICD-10-CM

## 2020-12-15 ENCOUNTER — Other Ambulatory Visit: Payer: Self-pay | Admitting: Family Medicine

## 2020-12-15 DIAGNOSIS — Z0000 Encounter for general adult medical examination without abnormal findings: Secondary | ICD-10-CM

## 2020-12-21 DIAGNOSIS — R109 Unspecified abdominal pain: Secondary | ICD-10-CM | POA: Diagnosis not present

## 2020-12-21 DIAGNOSIS — R1032 Left lower quadrant pain: Secondary | ICD-10-CM | POA: Diagnosis not present

## 2020-12-21 DIAGNOSIS — E860 Dehydration: Secondary | ICD-10-CM | POA: Diagnosis not present

## 2020-12-21 DIAGNOSIS — K5900 Constipation, unspecified: Secondary | ICD-10-CM | POA: Diagnosis not present

## 2020-12-22 DIAGNOSIS — R159 Full incontinence of feces: Secondary | ICD-10-CM | POA: Diagnosis not present

## 2020-12-22 DIAGNOSIS — E860 Dehydration: Secondary | ICD-10-CM | POA: Diagnosis not present

## 2020-12-22 DIAGNOSIS — R944 Abnormal results of kidney function studies: Secondary | ICD-10-CM | POA: Diagnosis not present

## 2020-12-22 DIAGNOSIS — E870 Hyperosmolality and hypernatremia: Secondary | ICD-10-CM | POA: Diagnosis not present

## 2020-12-28 NOTE — Progress Notes (Incomplete)
Phone [phone] In person visit   Subjective:   [name] is a 87 y.o. year old very pleasant male patient who presents for/with See problem oriented charting No chief complaint on file.   This visit occurred during the SARS-CoV-2 public health emergency.  Safety protocols were in place, including screening questions prior to the visit, additional usage of staff PPE, and extensive cleaning of exam room while observing appropriate contact time as indicated for disinfecting solutions.   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    Left wrist pain 11/01/2016   Venous insufficiency 10/02/2016   Right hydrocele 04/03/2016   Thrombocytopenia ([place]) 11/22/2015   CKD (chronic kidney disease), stage III ([place]) 11/22/2015   Sensorineural hearing loss (SNHL), bilateral 07/06/2015   Memory loss 07/21/2014   Bradycardia 07/09/2011   Chronic atrial fibrillation ([place]) 07/08/2011   Actinic keratosis 07/30/2009   Major depression in remission ([place]) 07/26/2007   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   Hemiparesis affecting right side as late effect of cerebrovascular accident ([place]) 03/26/2007   Hyperlipidemia 08/23/2006   Essential hypertension 08/23/2006   CAD (coronary artery disease) 08/23/2006   GERD 08/23/2006    Medications- reviewed and updated Current Outpatient Medications  Medication Sig Dispense Refill   amLODipine (NORVASC) 2.5 MG tablet TAKE 1 TABLET(2.5 MG) BY MOUTH DAILY 90 tablet 3   ELIQUIS 2.5 MG TABS tablet TAKE 1 TABLET(2.5 MG) BY MOUTH TWICE DAILY 60 tablet 6   escitalopram (LEXAPRO) 20 MG tablet TAKE 1 TABLET BY MOUTH DAILY 90 tablet 1   furosemide (LASIX) 40 MG tablet Take 40 mg by mouth. Per [facility]     Lansoprazole (PREVACID PO) Take 15 mg by mouth daily.      memantine (NAMENDA) 10 MG tablet TAKE 1 TABLET BY MOUTH DAILY 90 tablet 3   Multiple Vitamin (MULTIVITAMIN) tablet Take 1 tablet by mouth daily.     rosuvastatin (CRESTOR) 20 MG tablet TAKE 1 TABLET BY MOUTH EVERY DAY 90 tablet 3   tamsulosin (FLOMAX) 0.4 MG CAPS capsule TAKE 1 CAPSULE BY MOUTH AT BEDTIME 90 capsule 3   triamcinolone cream (KENALOG) 0.1 % Apply 1 application topically 2 (two) times daily. For 7-10 days maximum 160 g 0   No current facility-administered medications for this visit.     Objective:  There were no vitals taken for this visit. Gen: NAD, resting comfortably CV: RRR no murmurs rubs or gallops Lungs: CTAB no crackles, wheeze, rhonchi Abdomen: soft/nontender/nondistended/normal bowel sounds. No rebound or guarding.  Ext: no edema Skin: warm, dry Neuro: grossly normal, moves all extremities  ***    Assessment and Plan   # Thrush S:***  A/P: ***   # Stomach problems S: *** A/P: ***  #Memory loss-patient was still able to drive to grocery store in drugstore  At last visit but due to vision issues had to stop driving- car taken away -also had a lot of dental work -also recent sinus infection- now improving after seeing ENT -granddaughter married recently- a lot of balls to juggle for his daughter - Namenda 10 mg daily  -still  was able to drop off all o his tax forms   # Atrial fibrillation S: Rate controlled with no medication Anticoagulated with Eliquis 2.5 mg twice daily A/P: ***    #  CAD #hyperlipidemia S: Medication: rosuvastatin 20mg  daily.  On Eliquis alone without aspirin-aspirin was stopped July 2015 - no obvious chest pain or shortness of breath- history limited Lab Results  Component Value Date   CHOL 119 01/21/2020   HDL 50 01/21/2020   LDLCALC 54 01/21/2020   LDLDIRECT 66.0 06/01/2016   TRIG 74 01/21/2020   CHOLHDL 2.4 01/21/2020   A/P: ***  #Chronic kidney disease stage III or 4-follows with Dr. Carolin Sicks S: GFR is  typically in the low 30s or high 20s range*** -Patient knows to avoid NSAIDs- acetaminophen only if needed  A/P: ***   #hypertension S: medication: Amlodipine 2.5mg  daily, Lasix 40 mg per [facility]  Home readings #s: *** BP Readings from Last 3 Encounters:  07/23/20 (!) 124/58  07/07/20 (!) 150/62  01/21/20 (!) 152/78  A/P: ***  #Hemiparesis affecting right side as affected stroke- stable right-sided hemiparesis after stroke in 2003.  Likely related atrial fibrillation.  Patient continued to use cane.  Continued risk factor modification to reduce risk of another stroke   #Thrombocytopenia- mild anemia had been noted but most recently no thrombocytopenia on labs   # Depression S: Medication:Lexapro 20 mg -Difficult to truly monitor response due to garbled speech and memory issues A/P: ***   #BPH- difficult to assess sstatus. Continued current tamsulosin.    #he still monitored medications and lets daughter know if ran out   # cellulitis/venous stasis insufficiency- did wear compression stockings on 05/22 visit,  but not always wearing- encouraged him to use more regularly. Had cellulitis back in January and. Dr. Carolin Sicks treated. Gets small ulcers- discussed referral to wound clinic but too many visits on family's plate around this time    Health Maintenance Due  Topic Date Due   COVID-19 Vaccine (1) Never done   Zoster Vaccines- Shingrix (1 of 2) Never done   INFLUENZA VACCINE  09/27/2020   Recommended follow up: No follow-ups on file. Future Appointments  Date Time Provider [place] [place]  12/31/2020 11:00 AM [name], MD [facility]  01/27/2021  1:20 PM [name], MD [facility]    Lab/Order associations: No diagnosis found.  No orders of the defined types were placed in this encounter.   I,Jada Bradford,acting as a scribe for [name], MD.,have documented all relevant documentation on the behalf of [name], MD,as directed by   [name], MD while in the presence of [name], MD.  *** Return precautions advised.  Burnett Corrente

## 2020-12-30 DIAGNOSIS — R32 Unspecified urinary incontinence: Secondary | ICD-10-CM | POA: Diagnosis not present

## 2020-12-30 DIAGNOSIS — R748 Abnormal levels of other serum enzymes: Secondary | ICD-10-CM | POA: Diagnosis not present

## 2020-12-30 DIAGNOSIS — Z8719 Personal history of other diseases of the digestive system: Secondary | ICD-10-CM | POA: Diagnosis not present

## 2020-12-31 ENCOUNTER — Ambulatory Visit: Payer: Medicare Other | Admitting: Family Medicine

## 2020-12-31 DIAGNOSIS — F325 Major depressive disorder, single episode, in full remission: Secondary | ICD-10-CM

## 2020-12-31 DIAGNOSIS — I2510 Atherosclerotic heart disease of native coronary artery without angina pectoris: Secondary | ICD-10-CM

## 2020-12-31 DIAGNOSIS — I482 Chronic atrial fibrillation, unspecified: Secondary | ICD-10-CM

## 2020-12-31 DIAGNOSIS — B370 Candidal stomatitis: Secondary | ICD-10-CM

## 2020-12-31 DIAGNOSIS — Z23 Encounter for immunization: Secondary | ICD-10-CM

## 2020-12-31 DIAGNOSIS — K319 Disease of stomach and duodenum, unspecified: Secondary | ICD-10-CM

## 2020-12-31 DIAGNOSIS — E785 Hyperlipidemia, unspecified: Secondary | ICD-10-CM

## 2020-12-31 DIAGNOSIS — I10 Essential (primary) hypertension: Secondary | ICD-10-CM

## 2020-12-31 DIAGNOSIS — N1832 Chronic kidney disease, stage 3b: Secondary | ICD-10-CM

## 2021-01-08 ENCOUNTER — Other Ambulatory Visit: Payer: Self-pay | Admitting: Family Medicine

## 2021-01-27 ENCOUNTER — Encounter: Payer: Self-pay | Admitting: Family Medicine

## 2021-01-27 ENCOUNTER — Ambulatory Visit (INDEPENDENT_AMBULATORY_CARE_PROVIDER_SITE_OTHER): Payer: Medicare Other | Admitting: Family Medicine

## 2021-01-27 ENCOUNTER — Other Ambulatory Visit: Payer: Self-pay

## 2021-01-27 VITALS — BP 132/70 | HR 86 | Temp 98.4°F | Ht 72.0 in | Wt 188.0 lb

## 2021-01-27 DIAGNOSIS — E785 Hyperlipidemia, unspecified: Secondary | ICD-10-CM

## 2021-01-27 DIAGNOSIS — I10 Essential (primary) hypertension: Secondary | ICD-10-CM

## 2021-01-27 DIAGNOSIS — R413 Other amnesia: Secondary | ICD-10-CM

## 2021-01-27 DIAGNOSIS — Z0000 Encounter for general adult medical examination without abnormal findings: Secondary | ICD-10-CM

## 2021-01-27 DIAGNOSIS — Z23 Encounter for immunization: Secondary | ICD-10-CM | POA: Diagnosis not present

## 2021-01-27 DIAGNOSIS — I482 Chronic atrial fibrillation, unspecified: Secondary | ICD-10-CM

## 2021-01-27 DIAGNOSIS — R748 Abnormal levels of other serum enzymes: Secondary | ICD-10-CM

## 2021-01-27 DIAGNOSIS — N1832 Chronic kidney disease, stage 3b: Secondary | ICD-10-CM

## 2021-01-27 LAB — COMPREHENSIVE METABOLIC PANEL
ALT: 22 U/L (ref 0–53)
AST: 17 U/L (ref 0–37)
Albumin: 4.1 g/dL (ref 3.5–5.2)
Alkaline Phosphatase: 113 U/L (ref 39–117)
BUN: 33 mg/dL — ABNORMAL HIGH (ref 6–23)
CO2: 26 mEq/L (ref 19–32)
Calcium: 8.8 mg/dL (ref 8.4–10.5)
Chloride: 109 mEq/L (ref 96–112)
Creatinine, Ser: 1.95 mg/dL — ABNORMAL HIGH (ref 0.40–1.50)
GFR: 29.78 mL/min — ABNORMAL LOW (ref >60.00)
Glucose, Bld: 95 mg/dL (ref 70–99)
Potassium: 4.4 mEq/L (ref 3.5–5.1)
Sodium: 142 mEq/L (ref 135–145)
Total Bilirubin: 0.5 mg/dL (ref 0.2–1.2)
Total Protein: 6.9 g/dL (ref 6.0–8.3)

## 2021-01-27 LAB — CBC WITH DIFFERENTIAL/PLATELET
Basophils Absolute: 0 10*3/uL (ref 0.0–0.1)
Basophils Relative: 0.8 % (ref 0.0–3.0)
Eosinophils Absolute: 0.1 10*3/uL (ref 0.0–0.7)
Eosinophils Relative: 2.2 % (ref 0.0–5.0)
HCT: 30.5 % — ABNORMAL LOW (ref 39.0–52.0)
Hemoglobin: 10.2 g/dL — ABNORMAL LOW (ref 13.0–17.0)
Lymphocytes Relative: 17.7 % (ref 12.0–46.0)
Lymphs Abs: 0.9 10*3/uL (ref 0.7–4.0)
MCHC: 33.6 g/dL (ref 30.0–36.0)
MCV: 95.7 fl (ref 78.0–100.0)
Monocytes Absolute: 0.6 10*3/uL (ref 0.1–1.0)
Monocytes Relative: 13.1 % — ABNORMAL HIGH (ref 3.0–12.0)
Neutro Abs: 3.2 10*3/uL (ref 1.4–7.7)
Neutrophils Relative %: 66.2 % (ref 43.0–77.0)
Platelets: 179 10*3/uL (ref 150.0–400.0)
RBC: 3.19 Mil/uL — ABNORMAL LOW (ref 4.22–5.81)
RDW: 15.9 % — ABNORMAL HIGH (ref 11.5–15.5)
WBC: 4.9 10*3/uL (ref 4.0–10.5)

## 2021-01-27 LAB — LIPID PANEL
Cholesterol: 131 mg/dL (ref 0–200)
HDL: 65.6 mg/dL (ref >39.00)
LDL Cholesterol: 52 mg/dL (ref 0–99)
NonHDL: 65.79
Total CHOL/HDL Ratio: 2
Triglycerides: 69 mg/dL (ref 0.0–149.0)
VLDL: 13.8 mg/dL (ref 0.0–40.0)

## 2021-01-27 LAB — LIPASE: Lipase: 87 U/L — ABNORMAL HIGH (ref 11.0–59.0)

## 2021-01-27 LAB — AMYLASE: Amylase: 92 U/L (ref 27–131)

## 2021-01-27 NOTE — Patient Instructions (Addendum)
Health Maintenance Due  Topic Date Due   Zoster Vaccines- Shingrix (1 of 2)  Please check with your pharmacy to see if they have the shingrix vaccine. If they do- please get this immunization and update [place] by phone call or mychart with dates you receive the vaccine  Never done   INFLUENZA VACCINE - high dose flu shot today 09/27/2020   Consider updated covid bivalent booster at pharmacy  Please stop by lab before you go If you have mychart- we will send your results within 3 business days of [place] receiving them.  If you do not have mychart- we will call you about results within 5 business days of [place] receiving them.  *please also note that you will see labs on mychart as soon as they post. I will later go in and write notes on them- will say "notes from Dr. Yong Channel"   Recommended follow up: Return in about 6 months (around 07/28/2021) for follow up- or sooner if needed.

## 2021-01-27 NOTE — Progress Notes (Signed)
Phone: [phone]   Subjective:  Patient presents today for their annual physical. Chief complaint-noted.   See problem oriented charting- ROS- full  review of systems was unable to adequately be completed- level 5 caveat applies. Daughter notes some dental issues, drooling, hearing loss, swelling in legs still, visual issues, bruising easily .  The following were reviewed and entered/updated in epic: Past Medical History:  Diagnosis Date   Actinic keratosis    BPH with urinary obstruction    Bradycardia    CAD (coronary artery disease)    CABG x5   Cerebrovascular accident (stroke) ([place])    With right hemiparesis and persistent expressive aphasia   GERD (gastroesophageal reflux disease)    History of hydronephrosis    Hx of hydronephrosis 07/08/2011   right    Hyperlipidemia    Hypertension    Iron deficiency anemia due to chronic blood loss 03/26/2007   Now resolved     Iron deficiency anemia secondary to blood loss (chronic)    Persistent atrial fibrillation ([place])    Pulmonary hypertension ([place])    by echo 2013   Rosacea    Situational depression    Severe   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   treated after biopsy   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   KA - treated after biopsy   Tricuspid regurgitation    Patient Active Problem List   Diagnosis Date Noted   Memory loss 07/21/2014    Priority: High   Chronic atrial fibrillation ([place]) 07/08/2011    Priority: High   Hemiparesis affecting right side as late effect of cerebrovascular accident ([place]) 03/26/2007    Priority: High   CAD (coronary artery disease) 08/23/2006    Priority: High   Macular degeneration 01/21/2020    Priority: Medium    Venous insufficiency 10/02/2016    Priority: Medium    Right hydrocele 04/03/2016    Priority: Medium    Thrombocytopenia ([place]) 11/22/2015    Priority: Medium    CKD (chronic kidney disease), stage III ([place])  11/22/2015    Priority: Medium    Bradycardia 07/09/2011    Priority: Medium    Major depression in remission ([place]) 07/26/2007    Priority: Medium    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    Priority: Medium    Hyperlipidemia 08/23/2006    Priority: Medium    Essential hypertension 08/23/2006    Priority: Medium    Actinic keratosis 07/30/2009    Priority: Low   GERD 08/23/2006    Priority: Low   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    Left wrist pain 11/01/2016   Sensorineural hearing loss (SNHL), bilateral 07/06/2015   Past Surgical History:  Procedure Laterality Date   ABDOMINAL SURGERY     BACK SURGERY     BAND HEMORRHOIDECTOMY     CHOLECYSTECTOMY     CORONARY ARTERY BYPASS GRAFT     x 5   dental implants     HERNIA REPAIR     KIDNEY SURGERY     stenosis with stent   TONSILLECTOMY      Family History  Problem Relation Age of Onset   Heart disease Father    Heart attack Other        fhx   Coronary artery disease Other        fhx  Medications- reviewed and updated Current Outpatient Medications  Medication Sig Dispense Refill   amLODipine (NORVASC) 2.5 MG tablet TAKE 1 TABLET(2.5 MG) BY MOUTH DAILY 90 tablet 3   ELIQUIS 2.5 MG TABS tablet TAKE 1 TABLET(2.5 MG) BY MOUTH TWICE DAILY 60 tablet 6   escitalopram (LEXAPRO) 20 MG tablet TAKE 1 TABLET BY MOUTH DAILY 90 tablet 1   Ferrous Sulfate (IRON PO) Take 1 tablet by mouth daily.     furosemide (LASIX) 40 MG tablet Take 40 mg by mouth. Per [facility]     Lansoprazole (PREVACID PO) Take 15 mg by mouth daily.     memantine (NAMENDA) 10 MG tablet TAKE 1 TABLET BY MOUTH DAILY 90 tablet 3   Multiple Vitamin (MULTIVITAMIN) tablet Take 1 tablet by mouth daily.     rosuvastatin (CRESTOR) 20 MG tablet TAKE 1 TABLET BY MOUTH EVERY DAY 90 tablet 3   tamsulosin (FLOMAX) 0.4 MG CAPS capsule TAKE 1 CAPSULE BY MOUTH AT BEDTIME  90 capsule 3   triamcinolone cream (KENALOG) 0.1 % Apply 1 application topically 2 (two) times daily. For 7-10 days maximum 160 g 0   No current facility-administered medications for this visit.    Allergies-reviewed and updated Allergies  Allergen Reactions   Ciprofloxacin Hcl     unknown   Sulfamethoxazole-Trimethoprim     unknown    Social History   Social History Narrative   Lives in [place] with spouse. LIves at a house at a retirement home. 3 children. 6 grandkids. 5 greatgrandkids.       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 Oropharynx normal Neck: no thyromegaly CV: RRR no murmurs rubs or gallops Lungs: CTAB no crackles, wheeze, rhonchi Abdomen: soft/nontender/nondistended/normal bowel sounds. No rebound or guarding.  Ext: no edema Skin: warm, dry Neuro: PERRLA, garbled speech per baseline    Assessment and Plan  87 y.o. male presenting for annual physical.  Health Maintenance counseling: 1. Anticipatory guidance: Patient counseled regarding regular dental exams -advised q6 months- has had extensive dental work including teeth removal- working on [employer], eye exams -q6 months with macular degeneration- difficult to truly assess- may change to yearly,  avoiding smoking and second hand smoke , limiting alcohol to 2 beverages per day - still 1 per day glass of wine at 5 PM daily- advised in the past about caution with concerns about memory.   2.Risk factor reduction:  Advised patient of need for regular exercise and diet rich and fruits and vegetables to reduce risk of heart attack and stroke. Exercise-  some activity around the house- does a lot of the housework. Diet-one meal being provided  by retirement home and tries to avoid snacking-reasonably healthy diet resumed. May walk to bistro for lunch. Able to cook an egg for breakfast Wt Readings from Last 3 Encounters:  01/27/21 188 lb (85.3 kg)  07/23/20 190 lb (86.2 kg)  07/07/20 188 lb 9.6 oz (85.5 kg)  3. Immunizations/screenings/ancillary studies- discuss Shingrix-discussed at pharmacy, and Flu shot-high dose flu shot today - otherwise up-to-date. Immunization History  Administered Date(s) Administered   Fluad Quad(high Dose 65+) 01/21/2020   Influenza Split  01/09/2011   Influenza Whole 02/28/2003, 12/18/2007, 11/24/2009, 11/08/2018   Influenza, High Dose Seasonal PF 11/22/2015, 01/22/2018   Influenza-Unspecified 11/27/2016   Moderna Sars-Covid-2 Vaccination 04/01/2019, 04/29/2019, 02/03/20   Pneumococcal Conjugate-13 07/21/2014   Pneumococcal Polysaccharide-23 02/28/2004   Td 02/27/2005, 08/11/2019   4. Prostate cancer screening- past age based screening recommendations - no significant change in urinary symptoms   Lab Results  Component Value Date   PSA 0.47 08/11/2013   PSA 0.52 04/26/2007   5. Colon cancer screening - no blood in stool or melena. Past age based screening recommendations 6. Skin cancer screening- follow-up with dermatology mainly as needed.   Advised regular sunscreen use. Denies worrisome, changing, or new skin lesions.  7. Smoking associated screening (lung cancer screening, AAA screen 65-75, UA)- former smoker- quit over 50 years ago - no regular screening required 8. STD screening - active with wife only  Status of chronic or acute concerns   #abdominal pain- had abdominal pain after some antibiotics and then EMS came out and thought constipated potentially- [facility] cared for him and thought constipated possibly - miralax caused loose stools and new incontinence. Had labs  (including urine for UTI which was negative apparently) and x-rays to rule out obstruction and this was reassuring.  Apparently was told had 2 high numbers and needed to go to hospital then nurse came and evaluated him again and had normal vital signs and was no longer acting like he was in pain - so overall he is doing better. He was having incontinence but that has resolved as well.   - daughter was able to find labs at end of visit and amylase was 236 (range 28-100) and lipase 408 (range 13-60) - we opted to repeat these to see if trending down- if not could consider abdominal imaging (would be more challenging since could not use contrast) . Notes stated history of pancreatitis and colitis- had been sick in past.  Thankfully no longer having pain- maybe was having a case of pancreatitis- ideally avoid alcohol   #history of cellulitis/venous insufficiency- legs have been doing well- went to wound care on 11/24/20- nurse comes twice a day to make sure he takes meds and takes compression stockings on and off  # Memory loss likely vascular dementia -difficult to fully assess due to garbled speech from prior stroke S: Due to vision issues patient no longer driving. -Patient has remained on Namenda 10 mg for prolonged period-has been on this when I started seeing patient in 2015 -has seen slightly more tired but has had several things going on over last few weeks- now that those are better he seems to be doing better overall A/P: seems overall stable but difficult to fully assess- continue current medicines   # Atrial fibrillation S: Rate controlled with no medication Anticoagulated with Eliquis 2.5 mg twice daily A/P: Appropriately rate controlled without medication and anticoagulated with Eliquis-renally adjusted-continue current medication  #CAD #hyperlipidemia S: Medication:rosuvastatin 20 mg daily, on Eliquis alone without aspirin -aspirin was stopped July 2015 - no chest pain or shortness of breath reported  Lab Results  Component Value Date   CHOL 119 01/21/2020   HDL 50 01/21/2020   LDLCALC 54  01/21/2020   LDLDIRECT 66.0 06/01/2016   TRIG 74 01/21/2020   CHOLHDL 2.4 01/21/2020   A/P: For CAD-asymptomatic- continue current meds For HLD-was well-controlled last year-update full lipid panel today  #Chronic kidney disease stage III or IV - follows with Dr. Carolin Sicks S: GFR is  typically in the low 30s or high 20s range -Patient knows to avoid NSAIDs- acetaminophen only if needed-prior NSAID use was contributing - sparing prevacid for reflux  A/P: appeared stable on last check- update today for our records   #hypertension S: medication: amlodipine 2.5 mg daily, [place]x 40 mg per [facility] BP Readings from Last 3 Encounters:  01/27/21 132/70  07/23/20 (!) 124/58  07/07/20 (!) 150/62  A/P: Controlled. Continue current medications.   # Depression S: Medication:Lexapro 20 mg daily  - difficult to truly monitor due to patient having garbled speech and memory issues A/P: since overall stable opted to continue  #Hemiparesis affecting right side as affected stroke stable right-sided hemiparesis after stroke in 2003.  Likely related atrial fibrillation.  Patient continues to use cane.  Continue risk factor modification to reduce risk of another stroke -We discussed having a walker instead of simply a cane-has rollator walking at home but unclear if he will use over the cane   #Thrombocytopenia- mild anemia has been noted in addition to thrombocytopenia-some of anemia could be from chronic kidney disease.  If any worsening or new cell lines involved consider hematology oncology visit Lab Results  Component Value Date   WBC 4.5 11/08/2020   HGB 9.8 (L) 11/08/2020   HCT 29.2 (L) 11/08/2020   MCV 95.4 11/08/2020   PLT 138.0 (L) 11/08/2020   Recommended follow up: Return in about 6 months (around 07/28/2021) for follow up- or sooner if needed.  Lab/Order associations: NOT fasting   ICD-10-CM   1. Preventative health care  Z00.00     2. Essential hypertension  I10     3.  Hyperlipidemia, unspecified hyperlipidemia type  E78.5 Lipid panel    CBC with Differential/Platelet    Comprehensive metabolic panel    4. Memory loss  R41.3     5. Stage 3b chronic kidney disease (HCC)  N18.32     6. Chronic atrial fibrillation (HCC)  I48.20       No orders of the defined types were placed in this encounter.  I,Harris Phan,acting as a Education administrator for [name], MD.,have documented all relevant documentation on the behalf of [name], MD,as directed by  [name], MD while in the presence of [name], MD.    I, [name], MD, have reviewed all documentation for this visit. The documentation on 01/27/21 for the exam, diagnosis, procedures, and orders are all accurate and complete.   Return precautions advised.  [name], MD

## 2021-02-27 ENCOUNTER — Other Ambulatory Visit: Payer: Self-pay | Admitting: Family Medicine

## 2021-03-23 ENCOUNTER — Other Ambulatory Visit: Payer: Self-pay | Admitting: Family Medicine

## 2021-03-25 DIAGNOSIS — N184 Chronic kidney disease, stage 4 (severe): Secondary | ICD-10-CM | POA: Diagnosis not present

## 2021-03-29 DIAGNOSIS — N184 Chronic kidney disease, stage 4 (severe): Secondary | ICD-10-CM | POA: Diagnosis not present

## 2021-03-29 DIAGNOSIS — I129 Hypertensive chronic kidney disease with stage 1 through stage 4 chronic kidney disease, or unspecified chronic kidney disease: Secondary | ICD-10-CM | POA: Diagnosis not present

## 2021-03-29 DIAGNOSIS — D631 Anemia in chronic kidney disease: Secondary | ICD-10-CM | POA: Diagnosis not present

## 2021-03-29 DIAGNOSIS — N2581 Secondary hyperparathyroidism of renal origin: Secondary | ICD-10-CM | POA: Diagnosis not present

## 2021-04-07 ENCOUNTER — Other Ambulatory Visit: Payer: Self-pay | Admitting: Family Medicine

## 2021-04-10 ENCOUNTER — Other Ambulatory Visit: Payer: Self-pay | Admitting: Family Medicine

## 2021-04-14 DIAGNOSIS — L97309 Non-pressure chronic ulcer of unspecified ankle with unspecified severity: Secondary | ICD-10-CM | POA: Diagnosis not present

## 2021-04-14 DIAGNOSIS — L97229 Non-pressure chronic ulcer of left calf with unspecified severity: Secondary | ICD-10-CM | POA: Diagnosis not present

## 2021-04-14 DIAGNOSIS — I83003 Varicose veins of unspecified lower extremity with ulcer of ankle: Secondary | ICD-10-CM | POA: Diagnosis not present

## 2021-04-14 DIAGNOSIS — I872 Venous insufficiency (chronic) (peripheral): Secondary | ICD-10-CM | POA: Diagnosis not present

## 2021-04-20 ENCOUNTER — Ambulatory Visit (HOSPITAL_COMMUNITY)
Admission: RE | Admit: 2021-04-20 | Discharge: 2021-04-20 | Disposition: A | Discharge status: Home / Self Care | Payer: Medicare Other | Source: Ambulatory Visit | Attending: Nurse Practitioner | Admitting: Nurse Practitioner

## 2021-04-20 ENCOUNTER — Other Ambulatory Visit: Payer: Self-pay

## 2021-04-20 VITALS — BP 186/60 | HR 51 | Ht 72.0 in | Wt 183.6 lb

## 2021-04-20 DIAGNOSIS — I10 Essential (primary) hypertension: Secondary | ICD-10-CM | POA: Diagnosis not present

## 2021-04-20 DIAGNOSIS — R001 Bradycardia, unspecified: Secondary | ICD-10-CM | POA: Insufficient documentation

## 2021-04-20 DIAGNOSIS — D6869 Other thrombophilia: Secondary | ICD-10-CM

## 2021-04-20 DIAGNOSIS — Z951 Presence of aortocoronary bypass graft: Secondary | ICD-10-CM | POA: Insufficient documentation

## 2021-04-20 DIAGNOSIS — I69320 Aphasia following cerebral infarction: Secondary | ICD-10-CM | POA: Insufficient documentation

## 2021-04-20 DIAGNOSIS — I4811 Longstanding persistent atrial fibrillation: Secondary | ICD-10-CM | POA: Diagnosis not present

## 2021-04-20 DIAGNOSIS — Z7901 Long term (current) use of anticoagulants: Secondary | ICD-10-CM | POA: Diagnosis not present

## 2021-04-20 DIAGNOSIS — I4821 Permanent atrial fibrillation: Secondary | ICD-10-CM

## 2021-04-20 DIAGNOSIS — R609 Edema, unspecified: Secondary | ICD-10-CM | POA: Diagnosis not present

## 2021-04-20 NOTE — Progress Notes (Signed)
[name], male   DOB: Nov 14, 1928, 88 y.o.   MRN: [id number]       Primary Care Physician: [name], MD Primary Electrophysiologist: Dr. Rachelle Hora is a 88 y.o. male with a h/o long standing persistent atrial fibrillation who presents for f/u in the [facility]. The patient is aphasic due to previous stroke and is here with his daughter. He is off chronotropic drugs due to bradycardia. He  has a v rate of 48  bpm today. Daughter does not think he has been as active lately, but he does drive and goes to the grocery store. Does not report any lightheadedness of falls.  Taking eliquis without bleeding issues with a chadsvasc of at least 6. Continues on eliquis  to 2.5 mg bid. He has been seeing a kidney MD. Some BP meds have been adjusted by PCP lately.     He does live in  an assisted nursing facility with his wife and takes  his own meds.   F/u in afib clinic, 11/15. He remains stable with afib in the 50's. He walks  with a cane. Only one small fall at night getting out of the bed to urinate. No injuries. Has had chronic swelling of his rt lower extremity. Treated last June for cellulitis of this leg by PCP. Has had cellulitis of this leg before as well. Denies any lightheadedness or dizziness. Remains aphasic from a prior stroke. Here with daughter. Lives with wife.  F/u in afib clinic, 07/07/20. He remains  in permanent afib with CVR, no rate control drugs on board. He remains on eliquis 2.5 mg bid for CHA2DS2VASc score of 6. No bleeding issues. He has chronically swollen rt LE, Dr. Yong Channel feels prior CBAG surgery may be contributing. Several broken skin areas mon shin, does not look infected.   F/u in afib 04/20/21. He  is her with daughter. He finally had to give up driving for issues with his vision, He continues with aphasia. He stared having weeping of his rt leg with LLE last fall. He had his legs wrapped for awhile and now has a  nurse come to his home 2x a day to follow and to help put on/take off his compression sock. He remains in permanent afib with HR's in the low 50's. This is his usual and has not had any dizzy spells. One mechanical fall several months  ago.    Today, he denies symptoms of palpitations, chest pain, shortness of breath, orthopnea, PND,  dizziness, presyncope, syncope, snoring, daytime somnolence, bleeding, or neurologic sequela..The patient is tolerating medications without difficulties and is otherwise without complaint today.    Past Medical History:  Diagnosis Date   Actinic keratosis    BPH with urinary obstruction    Bradycardia    CAD (coronary artery disease)    CABG x5   Cerebrovascular accident (stroke) ([place])    With right hemiparesis and persistent expressive aphasia   GERD (gastroesophageal reflux disease)    History of hydronephrosis    Hx of hydronephrosis 07/08/2011   right    Hyperlipidemia    Hypertension    Iron deficiency anemia due to chronic blood loss 03/26/2007   Now resolved     Iron deficiency anemia secondary to blood loss (chronic)    Persistent atrial fibrillation ([place])    Pulmonary hypertension ([place])    by echo 2013   Rosacea    Situational depression  Severe   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   treated after biopsy   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   KA - treated after biopsy   Tricuspid regurgitation    Past Surgical History:  Procedure Laterality Date   ABDOMINAL SURGERY     BACK SURGERY     BAND HEMORRHOIDECTOMY     CHOLECYSTECTOMY     CORONARY ARTERY BYPASS GRAFT     x 5   dental implants     HERNIA REPAIR     KIDNEY SURGERY     stenosis with stent   TONSILLECTOMY        Allergies  Allergen Reactions   Ciprofloxacin Hcl     unknown   Sulfamethoxazole-Trimethoprim     unknown    Social History   Socioeconomic History   Marital status: Married    Spouse name: Not  on file   Number of children: Not on file   Years of education: Not on file   Highest education level: Not on file  Occupational History   Not on file  Tobacco Use   Smoking status: Former   Smokeless tobacco: Never   Tobacco comments:    x 88 yo per the dtr  Substance and Sexual Activity   Alcohol use: Yes    Alcohol/week: 1.0 standard drink    Types: 1 Glasses of wine per week    Comment: glass of wine at 5   Drug use: No   Sexual activity: Not Currently  Other Topics Concern   Not on file  Social History Narrative   Lives in [place] with spouse. LIves at a house at a retirement home. 3 children. 6 grandkids. 5 greatgrandkids.       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  ROS- All systems are reviewed and negative except as per the HPI above.  Physical Exam: GEN- The patient is elderly  appearing, alert and oriented x 3 today, aphasic   Head- normocephalic, atraumatic Eyes-  Sclera clear, conjunctiva pink Ears- hearing intact Oropharynx- clear Neck- supple, no JVP Lymph- no cervical lymphadenopathy Lungs- Clear to ausculation bilaterally, normal work of breathing Heart- Irregular rate and rhythm, no murmurs, rubs or gallops, PMI not laterally displaced GI- soft, NT, ND, + BS Extremities- no clubbing, cyanosis, or 1+edema on rt (prior cabg)   MS- no significant deformity or atrophy Skin- no rash or  lesion Psych- euthymic mood, full affect Neuro- strength and sensation are intact  Ekg today shows afib at 51 bpm    Holter monitor 02/2016-Study Highlights   Atrial fibrillation with controlled ventricular rates Average heart rate 58 bpm, max heart rate 98 bpm Nocturnal pauses of up to 3.2 seconds are noted,  No daytime  pauses Occasional premature ventricular contractions with rare nonsustained ventricular tachycardia  11/2//20 monitor- Atrial Fibrillation occurred continuously (100% burden), ranging from 30- 93 bpm (avg of 53 bpm). 6 Pauses occurred, the longest lasting 3.3 secs ( at night)  (18 bpm). Isolated VEs were rare (<1.0%), VE Couplets were rare (<1.0%), and no VE Triplets were present. Ventricular Bigeminy was present. No indication for PPM   Assessment and Plan:  1. Atrial fibrillation The patient has asymptomatic longstanding persistent atrial fibrillation with slow ventricular response,  v rates in the  50's  Monitor placed in January 2020, did  not show indication for PPM.    2. Previous CVA with aphasia Continue Eliquis  2.5 with chadsvasc of at least 6.  3. Bradycardia  Stable No presyncopal or syncopal episodes  Not on rate control   4.. HTN/edema Stable for pt Seeing a Kidney MD now as well as PCP   Did have some weeping back in the fall with compression dressing, now compression sock    F/u in 9 months    Butch Penny C. Mayeli Bornhorst, [facility] [address] [place], [place] [address] [phone]

## 2021-05-09 ENCOUNTER — Telehealth: Payer: Self-pay | Admitting: Family Medicine

## 2021-05-09 NOTE — Telephone Encounter (Signed)
See below

## 2021-05-09 NOTE — Telephone Encounter (Signed)
Pt's daughter stated her father is not doing well and his right leg is slightly swollen. She is wanting to schedule with Yong Channel but she can only bring him in on 3/14 or 3/17. There is nothing on the schedule for those days and she does not want a virtual. Please advise ?

## 2021-05-09 NOTE — Telephone Encounter (Signed)
4 pm on Tuesday 05/10/21 please ?

## 2021-05-10 ENCOUNTER — Ambulatory Visit (INDEPENDENT_AMBULATORY_CARE_PROVIDER_SITE_OTHER): Payer: Medicare Other | Admitting: Family Medicine

## 2021-05-10 ENCOUNTER — Encounter: Payer: Self-pay | Admitting: Family Medicine

## 2021-05-10 VITALS — BP 138/68 | HR 52 | Temp 98.0°F | Ht 72.0 in | Wt 192.4 lb

## 2021-05-10 DIAGNOSIS — R319 Hematuria, unspecified: Secondary | ICD-10-CM

## 2021-05-10 DIAGNOSIS — R809 Proteinuria, unspecified: Secondary | ICD-10-CM | POA: Diagnosis not present

## 2021-05-10 DIAGNOSIS — E785 Hyperlipidemia, unspecified: Secondary | ICD-10-CM | POA: Diagnosis not present

## 2021-05-10 DIAGNOSIS — I10 Essential (primary) hypertension: Secondary | ICD-10-CM

## 2021-05-10 DIAGNOSIS — R600 Localized edema: Secondary | ICD-10-CM

## 2021-05-10 LAB — POC URINALSYSI DIPSTICK (AUTOMATED)
Bilirubin, UA: NEGATIVE
Blood, UA: POSITIVE
Clarity, UA: NEGATIVE
Glucose, UA: NEGATIVE
Ketones, UA: NEGATIVE
Leukocytes, UA: NEGATIVE
Nitrite, UA: NEGATIVE
Protein, UA: POSITIVE — AB
Spec Grav, UA: 1.025 (ref 1.010–1.025)
Urobilinogen, UA: 0.2 E.U./dL
pH, UA: 6 (ref 5.0–8.0)

## 2021-05-10 NOTE — Progress Notes (Signed)
?Phone [phone] ?In person visit ?  ?Subjective:  ? ?[name] is a 88 y.o. year old very pleasant male patient who presents for/with See problem oriented charting ?Chief Complaint  ?Patient presents with  ? swollen right foot  ?  Leg swelling noticed within the last week and she is concerned about infection, he is wearing compression socks and is also having problems with moving that leg.  ? not feeling well  ?  Pt daughter states pt is not his normal self, no energy and his eyes look dull, appetite has decreased.   ? ? ?This visit occurred during the SARS-CoV-2 public health emergency.  Safety protocols were in place, including screening questions prior to the visit, additional usage of staff PPE, and extensive cleaning of exam room while observing appropriate contact time as indicated for disinfecting solutions.  ? ?Past Medical History-  ?Patient Active Problem List  ? Diagnosis Date Noted  ? Memory loss 07/21/2014  ?  Priority: High  ? Chronic atrial fibrillation ([place]) 07/08/2011  ?  Priority: High  ? Hemiparesis affecting right side as late effect of cerebrovascular accident ([place]) 03/26/2007  ?  Priority: High  ? CAD (coronary artery disease) 08/23/2006  ?  Priority: High  ? Macular degeneration 01/21/2020  ?  Priority: Medium   ? Venous insufficiency 10/02/2016  ?  Priority: Medium   ? Right hydrocele 04/03/2016  ?  Priority: Medium   ? Thrombocytopenia ([place]) 11/22/2015  ?  Priority: Medium   ? CKD (chronic kidney disease), stage III ([place]) 11/22/2015  ?  Priority: Medium   ? Bradycardia 07/09/2011  ?  Priority: Medium   ? Major depression in remission ([place]) 07/26/2007  ?  Priority: Medium   ? 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  ?  Priority: Medium   ? Hyperlipidemia 08/23/2006  ?  Priority: Medium   ? Essential hypertension 08/23/2006  ?  Priority: Medium   ? Actinic keratosis 07/30/2009  ?  Priority: Low  ? GERD 08/23/2006  ?  Priority: Low  ? 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 Left wrist pain 11/01/2016  ? Sensorineural hearing loss (SNHL), bilateral 07/06/2015  ? ? ?Medications- reviewed and updated ?Current Outpatient Medications  ?Medication Sig Dispense Refill  ? amLODipine (NORVASC) 2.5 MG tablet TAKE 1 TABLET(2.5 MG) BY MOUTH DAILY 90 tablet 3  ? ELIQUIS 2.5 MG TABS tablet TAKE 1 TABLET(2.5 MG) BY MOUTH TWICE DAILY 60 tablet 6  ? escitalopram (LEXAPRO) 20 MG tablet TAKE 1 TABLET BY MOUTH DAILY 90 tablet 1  ? Ferrous Sulfate (IRON PO) Take 1 tablet by mouth daily.    ? furosemide (LASIX) 40 MG tablet Take 40 mg by mouth. Per [facility]    ? Lansoprazole (PREVACID PO) Take 15 mg by mouth daily.    ? memantine (NAMENDA) 10 MG tablet TAKE 1 TABLET BY MOUTH DAILY 90 tablet 3  ? Multiple Vitamin (MULTIVITAMIN) tablet Take 1 tablet by mouth daily.    ? rosuvastatin (CRESTOR) 20 MG tablet TAKE 1 TABLET BY MOUTH EVERY DAY 90 tablet 3  ? tamsulosin (FLOMAX) 0.4 MG CAPS capsule TAKE 1 CAPSULE BY MOUTH AT BEDTIME 90 capsule 3  ? triamcinolone cream (KENALOG) 0.1 % Apply 1 application topically 2 (two) times daily. For 7-10 days maximum 160 g 0  ? ?No current facility-administered medications for this visit.  ? ?  ?  Objective:  ?BP 138/68   Pulse (!) 52   Temp 98 ?F (36.7 ?C)   Ht 6' (1.829 m)   Wt 192 lb 6.4 oz (87.3 kg)   SpO2 99%   BMI 26.09 kg/m?  ?Gen: NAD, resting comfortably ?CV: Bradycardic and irregular no murmurs rubs or gallops ?Lungs: CTAB no crackles, wheeze, rhonchi ?Ext: 2+ edema bilaterally right worse than left-right leg slightly more erythematous but could simply be venous stasis changes more prominent-perhaps mildly more warm on the right side ?Skin: warm, dry ?Neuro: Garbled speech per baseline ?  ? ?Assessment and Plan  ? ?#Increased fatigue/possible shortness of breath ?S:for a few weeks patients wife has been telling daughter that patient was not smiling as much and had low  energy. Daughter came to see him and seems to have less expression in his eye- less twinkle/more dull. Son in law saw him and said he would smile but much more short term.  ? ?Daughter tried to take him to restaurant but he seemed far more tired than usual (is sedentary). Moving right leg less - has seemed more swollen but started back with compression stockings (having someone come in to help him with these)  ?A/P: 88 year old male with increased fatigue and possible shortness of breath.  Appears to have increased edema in his legs and 4 pound weight gain from last visit here.  Legs appear equally edematous to me-slightly worse on the right and also slightly more erythematous but could simply be unequal venous stasis changes-I do not see an obvious cellulitis-does have some abrasions on the leg-family and patient agreed to monitor these and let me know if worsens ?- History of obviously limited by aphasia from prior stroke ?- We will update blood work with CBC, CMP, TSH, UA to look for any protein in the urine but he does have known CKD possibly stage IV ?- Also checking proBNP-wonder about new onset CHF and would consider repeat echocardiogram with last in 2015-saw cardiology within the last month but symptoms were not as prominent primarily seen for A-fib ? ?#hypertension ?S: medication: amlodipine 2.5 mg, lasix 40 mg  ?BP Readings from Last 3 Encounters:  ?05/10/21 138/68  ?04/20/21 (!) 186/60  ?01/27/21 132/70  ?A/P: Reasonable control on repeat-amlodipine certainly could contribute to edema but with CKD stage IV I would not feel great about hydrochlorothiazide or ACE inhibitor or angiotensin receptor blocker ? ?#hyperlipidemia ?S: Medication: Rosuvastatin 20 mg ?Lab Results  ?Component Value Date  ? CHOL 131 01/27/2021  ? HDL 65.60 01/27/2021  ? [place] 52 01/27/2021  ? LDLDIRECT 66.0 06/01/2016  ? TRIG 69.0 01/27/2021  ? CHOLHDL 2 01/27/2021  ? A/P: Excellent control last check-continue current medications  for now ? ?Recommended follow up: Return in about 1 month (around 06/10/2021) for follow up- or sooner if needed. ? ?Lab/Order associations: ?  ICD-10-CM   ?1. Edema leg  R60.0 CBC with Differential/Platelet  ?  Comprehensive metabolic panel  ?  TSH  ?  POCT Urinalysis Dipstick (Automated)  ?  Pro b natriuretic peptide  ?  ?2. Hyperlipidemia, unspecified hyperlipidemia type  E78.5 CBC with Differential/Platelet  ?  Comprehensive metabolic panel  ?  TSH  ?  ?3. Essential hypertension  I10 CBC with Differential/Platelet  ?  Comprehensive metabolic panel  ?  TSH  ?  ? ? ?No orders of the defined types were placed in this encounter. ? ? ?Return precautions advised.  ?[name], MD ? ?

## 2021-05-10 NOTE — Patient Instructions (Addendum)
I am not convinced he has a leg infection- both legs are swollen. Right leg is slightly redder and more warm. I want to watch this and have him elevate legs and wear compression on both sides. If worsening redness or warmth let me know ? ? ?Im more worried about the overall fatigue and likely shortness of breath- want to get bloodwork and urine to evaluate further ? ?Recommended follow up: Return in about 1 month (around 06/10/2021) for follow up- or sooner if needed.- or 3-6 months if symptoms improve ?

## 2021-05-10 NOTE — Telephone Encounter (Signed)
See below

## 2021-05-11 ENCOUNTER — Other Ambulatory Visit: Payer: Self-pay | Admitting: Family Medicine

## 2021-05-11 DIAGNOSIS — I509 Heart failure, unspecified: Secondary | ICD-10-CM

## 2021-05-11 LAB — COMPREHENSIVE METABOLIC PANEL
ALT: 22 U/L (ref 0–53)
AST: 23 U/L (ref 0–37)
Albumin: 4 g/dL (ref 3.5–5.2)
Alkaline Phosphatase: 97 U/L (ref 39–117)
BUN: 36 mg/dL — ABNORMAL HIGH (ref 6–23)
CO2: 23 mEq/L (ref 19–32)
Calcium: 9 mg/dL (ref 8.4–10.5)
Chloride: 110 mEq/L (ref 96–112)
Creatinine, Ser: 2.51 mg/dL — ABNORMAL HIGH (ref 0.40–1.50)
GFR: 21.95 mL/min — ABNORMAL LOW (ref >60.00)
Glucose, Bld: 81 mg/dL (ref 70–99)
Potassium: 4.8 mEq/L (ref 3.5–5.1)
Sodium: 140 mEq/L (ref 135–145)
Total Bilirubin: 0.3 mg/dL (ref 0.2–1.2)
Total Protein: 6.8 g/dL (ref 6.0–8.3)

## 2021-05-11 LAB — CBC WITH DIFFERENTIAL/PLATELET
Basophils Absolute: 0 10*3/uL (ref 0.0–0.1)
Basophils Relative: 0.9 % (ref 0.0–3.0)
Eosinophils Absolute: 0.2 10*3/uL (ref 0.0–0.7)
Eosinophils Relative: 3.6 % (ref 0.0–5.0)
HCT: 31 % — ABNORMAL LOW (ref 39.0–52.0)
Hemoglobin: 10.5 g/dL — ABNORMAL LOW (ref 13.0–17.0)
Lymphocytes Relative: 20.4 % (ref 12.0–46.0)
Lymphs Abs: 1.1 10*3/uL (ref 0.7–4.0)
MCHC: 33.9 g/dL (ref 30.0–36.0)
MCV: 96.8 fl (ref 78.0–100.0)
Monocytes Absolute: 0.7 10*3/uL (ref 0.1–1.0)
Monocytes Relative: 13.9 % — ABNORMAL HIGH (ref 3.0–12.0)
Neutro Abs: 3.3 10*3/uL (ref 1.4–7.7)
Neutrophils Relative %: 61.2 % (ref 43.0–77.0)
Platelets: 165 10*3/uL (ref 150.0–400.0)
RBC: 3.2 Mil/uL — ABNORMAL LOW (ref 4.22–5.81)
RDW: 14.2 % (ref 11.5–15.5)
WBC: 5.3 10*3/uL (ref 4.0–10.5)

## 2021-05-11 LAB — URINALYSIS, MICROSCOPIC ONLY: RBC / HPF: NONE SEEN (ref 0–?)

## 2021-05-11 LAB — MICROALBUMIN / CREATININE URINE RATIO
Creatinine,U: 83 mg/dL
Microalb Creat Ratio: 113.9 mg/g — ABNORMAL HIGH (ref 0.0–30.0)
Microalb, Ur: 94.6 mg/dL — ABNORMAL HIGH (ref 0.0–1.9)

## 2021-05-11 LAB — TSH: TSH: 3.53 u[IU]/mL (ref 0.35–5.50)

## 2021-05-11 LAB — PRO B NATRIURETIC PEPTIDE: NT-Pro BNP: 5381 pg/mL — ABNORMAL HIGH (ref 0–486)

## 2021-05-12 LAB — URINE CULTURE
MICRO NUMBER:: 13134182
Result:: NO GROWTH
SPECIMEN QUALITY:: ADEQUATE

## 2021-05-13 ENCOUNTER — Other Ambulatory Visit (INDEPENDENT_AMBULATORY_CARE_PROVIDER_SITE_OTHER): Payer: Medicare Other

## 2021-05-13 ENCOUNTER — Other Ambulatory Visit: Payer: Self-pay

## 2021-05-13 ENCOUNTER — Encounter: Payer: Self-pay | Admitting: Cardiovascular Disease

## 2021-05-13 ENCOUNTER — Ambulatory Visit: Payer: Medicare Other | Admitting: Cardiovascular Disease

## 2021-05-13 VITALS — BP 120/62 | HR 62 | Ht 72.0 in | Wt 184.0 lb

## 2021-05-13 DIAGNOSIS — I509 Heart failure, unspecified: Secondary | ICD-10-CM

## 2021-05-13 DIAGNOSIS — R809 Proteinuria, unspecified: Secondary | ICD-10-CM

## 2021-05-13 DIAGNOSIS — R319 Hematuria, unspecified: Secondary | ICD-10-CM

## 2021-05-13 DIAGNOSIS — N1832 Chronic kidney disease, stage 3b: Secondary | ICD-10-CM | POA: Diagnosis not present

## 2021-05-13 DIAGNOSIS — I4819 Other persistent atrial fibrillation: Secondary | ICD-10-CM | POA: Diagnosis not present

## 2021-05-13 DIAGNOSIS — I5032 Chronic diastolic (congestive) heart failure: Secondary | ICD-10-CM

## 2021-05-13 DIAGNOSIS — R600 Localized edema: Secondary | ICD-10-CM

## 2021-05-13 DIAGNOSIS — I2510 Atherosclerotic heart disease of native coronary artery without angina pectoris: Secondary | ICD-10-CM | POA: Diagnosis not present

## 2021-05-13 LAB — URINALYSIS, MICROSCOPIC ONLY

## 2021-05-13 LAB — MICROALBUMIN / CREATININE URINE RATIO
Creatinine,U: 76.2 mg/dL
Microalb Creat Ratio: 282 mg/g — ABNORMAL HIGH (ref 0.0–30.0)
Microalb, Ur: 215 mg/dL — ABNORMAL HIGH (ref 0.0–1.9)

## 2021-05-13 MED ORDER — FUROSEMIDE 40 MG PO TABS: 40.0000 mg | ORAL_TABLET | Freq: Every day | ORAL | 3 refills | Status: DC

## 2021-05-13 NOTE — Progress Notes (Signed)
? ?Chief Complaint  ?Patient presents with  ? Follow-up  ?  CAD, LE edema  ? ? ?History of Present Illness: 88 yo male with history of BPH, CAD s/p CABG remotely, prior CVA, GERD, hyperlipidemia, HTN, iron deficiency anemia, CKD stage IV, persistent atrial fibrillation, depression here to establish cardiology care in my office. He has been followed by Dr. Rayann Heman and most recently in the atrial fib clinic. Long standing history of persistent atrial fibrillation and has been on Eliquis. He has bradycardia and is off of all AV nodal blocking drugs. He is aphasic from a prior stroke. He is known to have CAD with 5V CABG in the mid 91s. He has chronic lower extremity edema felt to be due to venous insufficiency. This has worsened recently and when he was seen by Dr. Yong Channel in primary care on 05/10/21. BNP 5381. His daughter is with him today and she found out yesterday that he had been off of lasix for 2 months. He is now back on Lasix 40 mg daily and his LE edema is improved. He tells me today that he feels better. No dyspnea or chest pain. LE edema improved.  ? ?Primary Care Physician: [name], MD ? ? ?Past Medical History:  ?Diagnosis Date  ? Actinic keratosis   ? BPH with urinary obstruction   ? Bradycardia   ? CAD (coronary artery disease)   ? CABG x5  ? Cerebrovascular accident (stroke) ([place])   ? With right hemiparesis and persistent expressive aphasia  ? GERD (gastroesophageal reflux disease)   ? History of hydronephrosis   ? Hx of hydronephrosis 07/08/2011  ? right   ? Hyperlipidemia   ? Hypertension   ? Iron deficiency anemia due to chronic blood loss 03/26/2007  ? Now resolved    ? Iron deficiency anemia secondary to blood loss (chronic)   ? Persistent atrial fibrillation ([place])   ? Pulmonary hypertension ([place])   ? by echo 2013  ? Rosacea   ? Situational depression   ? Severe  ? 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 treated after biopsy  ? Squamous cell carcinoma of dorsum  of right hand 12/13/2015  ? KA - treated after biopsy  ? Tricuspid regurgitation   ? ? ?Past Surgical History:  ?Procedure Laterality Date  ? ABDOMINAL SURGERY    ? BACK SURGERY    ? BAND HEMORRHOIDECTOMY    ? CHOLECYSTECTOMY    ? CORONARY ARTERY BYPASS GRAFT    ? x 5  ? dental implants    ? HERNIA REPAIR    ? KIDNEY SURGERY    ? stenosis with stent  ? TONSILLECTOMY    ? ? ?Current Outpatient Medications  ?Medication Sig Dispense Refill  ? amLODipine (NORVASC) 2.5 MG tablet TAKE 1 TABLET(2.5 MG) BY MOUTH DAILY 90 tablet 3  ? ELIQUIS 2.5 MG TABS tablet TAKE 1 TABLET(2.5 MG) BY MOUTH TWICE DAILY 60 tablet 6  ? escitalopram (LEXAPRO) 20 MG tablet TAKE 1 TABLET BY MOUTH DAILY 90 tablet 1  ? Ferrous Sulfate (IRON PO) Take 1 tablet by mouth daily.    ? furosemide (LASIX) 40 MG tablet Take 1 tablet (40 mg total) by mouth daily. 90 tablet 3  ? Lansoprazole (PREVACID PO) Take 15 mg by mouth daily.    ? memantine (NAMENDA) 10 MG tablet TAKE 1 TABLET BY MOUTH DAILY 90 tablet 3  ? Multiple Vitamin (MULTIVITAMIN) tablet Take 1 tablet by mouth daily.    ?  rosuvastatin (CRESTOR) 20 MG tablet TAKE 1 TABLET BY MOUTH EVERY DAY 90 tablet 3  ? tamsulosin (FLOMAX) 0.4 MG CAPS capsule TAKE 1 CAPSULE BY MOUTH AT BEDTIME 90 capsule 3  ? triamcinolone cream (KENALOG) 0.1 % Apply 1 application topically 2 (two) times daily. For 7-10 days maximum (Patient not taking: Reported on 05/13/2021) 160 g 0  ? ?No current facility-administered medications for this visit.  ? ? ?Allergies  ?Allergen Reactions  ? Ciprofloxacin Hcl   ?  unknown  ? Sulfamethoxazole-Trimethoprim   ?  unknown  ? ? ?Social History  ? ?Socioeconomic History  ? Marital status: Married  ?  Spouse name: Not on file  ? Number of children: Not on file  ? Years of education: Not on file  ? Highest education level: Not on file  ?Occupational History  ? Not on file  ?Tobacco Use  ? Smoking status: Former  ? Smokeless tobacco: Never  ? Tobacco comments:  ?  x 88 yo per the dtr   ?Substance and Sexual Activity  ? Alcohol use: Yes  ?  Alcohol/week: 1.0 standard drink  ?  Types: 1 Glasses of wine per week  ?  Comment: glass of wine at 7  ? Drug use: No  ? Sexual activity: Not Currently  ?Other Topics Concern  ? Not on file  ?Social History Narrative  ? Lives in [place] with spouse. LIves at a house at a retirement home. 3 children. 6 grandkids. 5 greatgrandkids.   ?   ? Retired at age 72  ? 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 ?Social Determinants of Health  ? ?Financial Resource Strain: Not on file  ?Food Insecurity: Not on file  ?Transportation Needs: Not on file  ?Physical Activity: Not on file  ?Stress: Not on file  ?Social Connections: Not on file  ?Intimate Partner Violence: Not on file  ? ? ?Family History  ?Problem Relation Age of Onset  ? Heart disease Father   ? Heart attack Other   ?     fhx  ? Coronary artery disease Other   ?     fhx  ? ? ?Review of Systems:  As stated in the HPI and otherwise negative.  ? ?BP 120/62   Pulse 62   Ht 6' (1.829 m)   Wt 184 lb (83.5 kg)   SpO2 98%   BMI 24.95 kg/m?  ? ?Physical Examination: ?General: Well developed, well nourished, NAD  ?HEENT: OP clear, mucus membranes moist  ?SKIN: warm, dry. No rashes. ?Neuro: No focal deficits  ?Musculoskeletal: Muscle strength 5/5 all ext  ?Psychiatric: Mood and affect normal  ?Neck: No JVD, no carotid bruits, no thyromegaly, no lymphadenopathy.  ?Lungs:Clear bilaterally, no wheezes, rhonci, crackles ?Cardiovascular: Regular rate and rhythm. No murmurs, gallops or rubs. ?Abdomen:Soft. Bowel sounds present. Non-tender.  ?Extremities: No lower extremity edema. Pulses are 2 + in the bilateral DP/PT. ? ?EKG:  EKG is not ordered today. ?The ekg ordered today demonstrates  ? ?Recent Labs: ?05/10/2021: ALT 22; BUN 36; Creatinine, Ser 2.51; Hemoglobin 10.5; NT-Pro BNP 5,381; Platelets 165.0; Potassium 4.8; Sodium 140; TSH 3.53  ? ?Lipid Panel ?   ?Component Value Date/Time  ?  CHOL 131 01/27/2021 1419  ? TRIG 69.0 01/27/2021 1419  ? TRIG 41 12/29/2005 0930  ? HDL 65.60 01/27/2021 1419  ? CHOLHDL 2 01/27/2021 1419  ? VLDL 13.8 01/27/2021 1419  ? LDLCALC 52 01/27/2021 1419  ? [place] 54  01/21/2020 1218  ? LDLDIRECT 66.0 06/01/2016 1136  ? ?  ?Wt Readings from Last 3 Encounters:  ?05/13/21 184 lb (83.5 kg)  ?05/10/21 192 lb 6.4 oz (87.3 kg)  ?04/20/21 183 lb 9.6 oz (83.3 kg)  ?  ? ?Assessment and Plan:  ? ?1. CAD s/p CABG without angina: He has no chest pain. Continue statin. No ASA since he is on Eliquis.  ? ?2. Persistent atrial fib: Rate controlled today. Continue Eliquis.  ? ?3. Chronic diastolic CHF: Weight is down over past three days back on lasix. LE edema is improved. Continue Lasix 40 mg daily. Echo pending next week.  ? ?Labs/ tests ordered today include:  ?No orders of the defined types were placed in this encounter. ? ?Disposition:   F/U with me 6 months.  ? ?Signed, ?Lauree Chandler, MD ?05/13/2021 3:34 PM    ?[facility] ?[address], [place], [place]  [address] ?Phone: [phone]; Fax: [phone]  ? ? ?

## 2021-05-13 NOTE — Patient Instructions (Signed)
Medication Instructions:  ?Your physician has recommended you make the following change in your medication:  ?1.) restart furosemide (Lasix) 40 mg - one tablet daily ? ?*If you need a refill on your cardiac medications before your next appointment, please call your pharmacy* ? ? ?Lab Work: ?None ordered ?If you have labs (blood work) drawn today and your tests are completely normal, you will receive your results only by: ?MyChart Message (if you have MyChart) OR ?A paper copy in the mail ?If you have any lab test that is abnormal or we need to change your treatment, we will call you to review the results. ? ? ?Testing/Procedures: ?None ordered today. ? ? ?Follow-Up: ?At [facility], you and your health needs are our priority.  As part of our continuing mission to provide you with exceptional heart care, we have created designated Provider Care Teams.  These Care Teams include your primary Cardiologist (physician) and Advanced Practice Providers (APPs -  Physician Assistants and Nurse Practitioners) who all work together to provide you with the care you need, when you need it. ? ?We recommend signing up for the patient portal called "MyChart".  Sign up information is provided on this After Visit Summary.  MyChart is used to connect with patients for Virtual Visits (Telemedicine).  Patients are able to view lab/test results, encounter notes, upcoming appointments, etc.  Non-urgent messages can be sent to your provider as well.   ?To learn more about what you can do with MyChart, go to [email or web address].   ? ?Your next appointment:   ?6 month(s) ? ?The format for your next appointment:   ?In Person ? ?Provider:   ?Lauree Chandler, MD ? ? ?Other Instructions ?  ?

## 2021-05-15 LAB — URINE CULTURE
MICRO NUMBER:: 13145585
SPECIMEN QUALITY:: ADEQUATE

## 2021-05-16 ENCOUNTER — Ambulatory Visit (HOSPITAL_COMMUNITY)
Admission: RE | Admit: 2021-05-16 | Discharge: 2021-05-16 | Disposition: A | Discharge status: Home / Self Care | Payer: Medicare Other | Source: Ambulatory Visit | Attending: Family Medicine | Admitting: Family Medicine

## 2021-05-16 ENCOUNTER — Other Ambulatory Visit: Payer: Self-pay

## 2021-05-16 DIAGNOSIS — Z951 Presence of aortocoronary bypass graft: Secondary | ICD-10-CM | POA: Diagnosis not present

## 2021-05-16 DIAGNOSIS — I5032 Chronic diastolic (congestive) heart failure: Secondary | ICD-10-CM | POA: Diagnosis not present

## 2021-05-16 DIAGNOSIS — I509 Heart failure, unspecified: Secondary | ICD-10-CM | POA: Diagnosis not present

## 2021-05-16 DIAGNOSIS — I2510 Atherosclerotic heart disease of native coronary artery without angina pectoris: Secondary | ICD-10-CM | POA: Diagnosis not present

## 2021-05-16 DIAGNOSIS — I4891 Unspecified atrial fibrillation: Secondary | ICD-10-CM | POA: Insufficient documentation

## 2021-05-16 DIAGNOSIS — I110 Hypertensive heart disease with heart failure: Secondary | ICD-10-CM | POA: Diagnosis not present

## 2021-05-16 DIAGNOSIS — E785 Hyperlipidemia, unspecified: Secondary | ICD-10-CM | POA: Diagnosis not present

## 2021-05-16 LAB — ECHOCARDIOGRAM COMPLETE
MV M vel: 5.6 m/s
MV Peak grad: 125.4 mmHg
S' Lateral: 3.9 cm

## 2021-05-19 ENCOUNTER — Telehealth: Payer: Self-pay | Admitting: Family Medicine

## 2021-05-19 NOTE — Telephone Encounter (Signed)
Patient wants a call back re heart ultrasound results -  ?

## 2021-05-19 NOTE — Telephone Encounter (Signed)
Called and spoke with pt daughter and echo results reviewed.  ?

## 2021-05-23 NOTE — Progress Notes (Signed)
? ?Phone [phone] ?In person visit ?  ?Subjective:  ? ?[name] is a 88 y.o. year old very pleasant male patient who presents for/with See problem oriented charting ?Chief Complaint  ?Patient presents with  ? Leg Swelling  ?  States at the time he was not taking his lasix. He started taking that a couple of days later and swelling went down.   ? ? ?This visit occurred during the SARS-CoV-2 public health emergency.  Safety protocols were in place, including screening questions prior to the visit, additional usage of staff PPE, and extensive cleaning of exam room while observing appropriate contact time as indicated for disinfecting solutions.  ? ?Past Medical History-  ?Patient Active Problem List  ? Diagnosis Date Noted  ? Heart failure with preserved ejection fraction ([place]) 06/14/2021  ?  Priority: High  ? Memory loss 07/21/2014  ?  Priority: High  ? Chronic atrial fibrillation ([place]) 07/08/2011  ?  Priority: High  ? Hemiparesis affecting right side as late effect of cerebrovascular accident ([place]) 03/26/2007  ?  Priority: High  ? CAD (coronary artery disease) 08/23/2006  ?  Priority: High  ? Macular degeneration 01/21/2020  ?  Priority: Medium   ? Venous insufficiency 10/02/2016  ?  Priority: Medium   ? Right hydrocele 04/03/2016  ?  Priority: Medium   ? Thrombocytopenia ([place]) 11/22/2015  ?  Priority: Medium   ? CKD (chronic kidney disease), stage III ([place]) 11/22/2015  ?  Priority: Medium   ? Bradycardia 07/09/2011  ?  Priority: Medium   ? Major depression in remission ([place]) 07/26/2007  ?  Priority: Medium   ? 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  ?  Priority: Medium   ? Hyperlipidemia 08/23/2006  ?  Priority: Medium   ? Essential hypertension 08/23/2006  ?  Priority: Medium   ? Disorder of bone and cartilage 06/14/2021  ?  Priority: Low  ? Disorder of gallbladder 06/14/2021  ?  Priority: Low  ? 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   ?  Priority: Low  ? Left wrist pain  11/01/2016  ?  Priority: Low  ? Sensorineural hearing loss (SNHL), bilateral 07/06/2015  ?  Priority: Low  ? Actinic keratosis 07/30/2009  ?  Priority: Low  ? GERD 08/23/2006  ?  Priority: Low  ? ? ?Medications- reviewed and updated ?Current Outpatient Medications  ?Medication Sig Dispense Refill  ? amLODipine (NORVASC) 2.5 MG tablet TAKE 1 TABLET(2.5 MG) BY MOUTH DAILY 90 tablet 3  ? ELIQUIS 2.5 MG TABS tablet TAKE 1 TABLET(2.5 MG) BY MOUTH TWICE DAILY 60 tablet 9  ? escitalopram (LEXAPRO) 20 MG tablet TAKE 1 TABLET BY MOUTH DAILY 90 tablet 1  ? Ferrous Sulfate (IRON PO) Take 1 tablet by mouth daily.    ? furosemide (LASIX) 40 MG tablet Take 1 tablet (40 mg total) by mouth daily. 90 tablet 3  ? Lansoprazole (PREVACID PO) Take 15 mg by mouth daily.    ? memantine (NAMENDA) 10 MG tablet TAKE 1 TABLET BY MOUTH DAILY 90 tablet 3  ? Multiple Vitamin (MULTIVITAMIN) tablet Take 1 tablet by mouth daily.    ? rosuvastatin (CRESTOR) 20 MG tablet TAKE 1 TABLET BY MOUTH EVERY DAY 90 tablet 3  ? tamsulosin (FLOMAX) 0.4 MG CAPS capsule TAKE 1 CAPSULE BY MOUTH AT BEDTIME 90 capsule 3  ? triamcinolone cream (KENALOG) 0.1 % Apply 1 application topically 2 (two) times daily. For 7-10 days maximum 160 g 0  ? ?  No current facility-administered medications for this visit.  ? ?  ?Objective:  ?BP 132/62 (BP Location: Left Arm, Patient Position: Sitting, Cuff Size: Large)   Pulse (!) 53   Temp 98 ?F (36.7 ?C) (Temporal)   Ht 6' (1.829 m)   Wt 182 lb 2 oz (82.6 kg)   SpO2 98%   BMI 24.70 kg/m?  ?Gen: NAD, resting comfortably ?CV: RRR no murmurs rubs or gallops ?Lungs: CTAB no crackles, wheeze, rhonchi ?Abdomen: soft/nontender/nondistended/normal bowel sounds.  ?Ext: trace to 1+ edema ?Skin: warm, dry ? ?  ? ?Assessment and Plan  ? ?#Heart failure with preserved ejection fraction ?S: Medication: lasix 40 mg daily  ?- last visit increased edema, smiling less, low energy - daughter eventually discovered he had stopped taking his lasix as  likely culprit and simply restarted instead of taking twice daily.  Pro BNP came back over 5000. Saw cardiology Dr. Angelena Form 3 days later and was already improving on lasix. Echo done 05/16/21 ? ?Edema: better ?Weight gain:weight down 8 lbs since restarting lasix ?Shortness of breath: none reported- challenging to get history with prior CVA though ?A/P: CHF appears much improved- continue currnet medicine ? ?#hypertension ?S: medication: amlodipine 2.5 mg, lasix 40 mg  ?BP Readings from Last 3 Encounters:  ?06/14/21 132/62  ?05/13/21 120/62  ?05/10/21 138/68  ?A/P: Controlled. Continue current medications.  ? ?#hyperlipidemia/history of stroke with hemiparesis ?S: Medication: Rosuvastatin 20 mg ?Lab Results  ?Component Value Date  ? CHOL 131 01/27/2021  ? HDL 65.60 01/27/2021  ? [place] 52 01/27/2021  ? LDLDIRECT 66.0 06/01/2016  ? TRIG 69.0 01/27/2021  ? CHOLHDL 2 01/27/2021  ? A/P: Controlled. Continue current medications.  ?Ideal control LDL under 70 ? ?Hemiparesis form prior stroke noted and stable- on eqliquis alone due to a fib ? ?# Depression ?S: Medication:lexapro 20 mg- daughter discovered had been off of this but has restarted. Mood has improved  ? ?  05/10/2021  ?  4:10 PM 01/21/2020  ? 11:31 AM 10/05/2017  ?  4:40 PM  ?Depression screen PHQ 2/9  ?Decreased Interest 0 0 0  ?Down, Depressed, Hopeless 0 0 0  ?PHQ - 2 Score 0 0 0  ?Altered sleeping 0 0   ?Tired, decreased energy 0 0   ?Change in appetite 0 0   ?Feeling bad or failure about yourself  0 0   ?Trouble concentrating 0 0   ?Moving slowly or fidgety/restless 0 0   ?Suicidal thoughts 0 0   ?PHQ-9 Score 0 0   ?Difficult doing work/chores Not difficult at all Not difficult at all   ?A/P: full remission- continue current meds ? ?#BPH- appears had UTIbacteremia from E. Coli in 2013 and has been continued since that time- offered to stop medication but would have to watch closely- prefers to stay ?  ?Recommended follow up: Return in about 4 months (around  10/14/2021) for followup or sooner if needed.Schedule b4 you leave. ? ?Lab/Order associations: ?  ICD-10-CM   ?1. Essential hypertension  I10 Comprehensive metabolic panel  ?  ?2. Hyperlipidemia, unspecified hyperlipidemia type  E78.5 Comprehensive metabolic panel  ?  ?3. Major depression in remission [facility]) Chronic F32.5   ?  ?4. Hemiparesis affecting right side as late effect of cerebrovascular accident [facility]) Chronic I69.351   ?  ?5. Chronic heart failure with preserved ejection fraction (HCC)  I50.32   ?  ? ?No orders of the defined types were placed in this encounter. ? ?Return precautions advised.  ?Annie Main  Yong Channel, MD ? ? ?

## 2021-05-29 ENCOUNTER — Other Ambulatory Visit: Payer: Self-pay | Admitting: Nurse Practitioner

## 2021-06-14 ENCOUNTER — Encounter: Payer: Self-pay | Admitting: Family Medicine

## 2021-06-14 ENCOUNTER — Ambulatory Visit (INDEPENDENT_AMBULATORY_CARE_PROVIDER_SITE_OTHER): Payer: Medicare Other | Admitting: Family Medicine

## 2021-06-14 VITALS — BP 132/62 | HR 53 | Temp 98.0°F | Ht 72.0 in | Wt 182.1 lb

## 2021-06-14 DIAGNOSIS — K859 Acute pancreatitis without necrosis or infection, unspecified: Secondary | ICD-10-CM | POA: Insufficient documentation

## 2021-06-14 DIAGNOSIS — I5032 Chronic diastolic (congestive) heart failure: Secondary | ICD-10-CM

## 2021-06-14 DIAGNOSIS — I10 Essential (primary) hypertension: Secondary | ICD-10-CM

## 2021-06-14 DIAGNOSIS — K829 Disease of gallbladder, unspecified: Secondary | ICD-10-CM | POA: Insufficient documentation

## 2021-06-14 DIAGNOSIS — I69351 Hemiplegia and hemiparesis following cerebral infarction affecting right dominant side: Secondary | ICD-10-CM

## 2021-06-14 DIAGNOSIS — E785 Hyperlipidemia, unspecified: Secondary | ICD-10-CM | POA: Diagnosis not present

## 2021-06-14 DIAGNOSIS — I5030 Unspecified diastolic (congestive) heart failure: Secondary | ICD-10-CM | POA: Insufficient documentation

## 2021-06-14 DIAGNOSIS — F325 Major depressive disorder, single episode, in full remission: Secondary | ICD-10-CM | POA: Diagnosis not present

## 2021-06-14 DIAGNOSIS — M899 Disorder of bone, unspecified: Secondary | ICD-10-CM | POA: Insufficient documentation

## 2021-06-14 DIAGNOSIS — M1990 Unspecified osteoarthritis, unspecified site: Secondary | ICD-10-CM | POA: Insufficient documentation

## 2021-06-14 LAB — COMPREHENSIVE METABOLIC PANEL
ALT: 23 U/L (ref 0–53)
AST: 20 U/L (ref 0–37)
Albumin: 4.1 g/dL (ref 3.5–5.2)
Alkaline Phosphatase: 89 U/L (ref 39–117)
BUN: 44 mg/dL — ABNORMAL HIGH (ref 6–23)
CO2: 28 mEq/L (ref 19–32)
Calcium: 8.6 mg/dL (ref 8.4–10.5)
Chloride: 104 mEq/L (ref 96–112)
Creatinine, Ser: 2.35 mg/dL — ABNORMAL HIGH (ref 0.40–1.50)
GFR: 23.74 mL/min — ABNORMAL LOW (ref >60.00)
Glucose, Bld: 154 mg/dL — ABNORMAL HIGH (ref 70–99)
Potassium: 4.3 mEq/L (ref 3.5–5.1)
Sodium: 140 mEq/L (ref 135–145)
Total Bilirubin: 0.4 mg/dL (ref 0.2–1.2)
Total Protein: 7.1 g/dL (ref 6.0–8.3)

## 2021-06-14 NOTE — Patient Instructions (Addendum)
Please stop by lab before you go ?If you have mychart- we will send your results within 3 business days of [place] receiving them.  ?If you do not have mychart- we will call you about results within 5 business days of [place] receiving them.  ?*please also note that you will see labs on mychart as soon as they post. I will later go in and write notes on them- will say "notes from Dr. Yong Channel"  ? ?Thrilled you are doing better ? ?Recommended follow up: Return in about 4 months (around 10/14/2021) for followup or sooner if needed.Schedule b4 you leave. ?Somewhere between 3-6 months.  ?

## 2021-07-14 ENCOUNTER — Telehealth: Payer: Self-pay

## 2021-07-14 NOTE — Telephone Encounter (Signed)
Sharyn Lull, RN with [facility] has called stating she paid patient a visit today.  States patient is on Eliquis.  States he has lower leg edema.  Noticed that patients left leg was swollen and had a open wound that looked to be a  skin tear that is red.  She did wrap it in gauze.  States patient also has a skin tear on one of his arms.    I have scheduled patient with Dr Cherlynn Kaiser for tomorrow 5/18 at 1:30.  Sharyn Lull is going to notify patient daughter to bring patient to appointment.  If time slot does not work, she will call us first thing in the morning to change.   I am copying Dr. Gertie Fey team on this message as well.

## 2021-07-15 ENCOUNTER — Other Ambulatory Visit (HOSPITAL_COMMUNITY): Payer: Self-pay

## 2021-07-15 ENCOUNTER — Telehealth: Payer: Self-pay | Admitting: Family Medicine

## 2021-07-15 ENCOUNTER — Ambulatory Visit (INDEPENDENT_AMBULATORY_CARE_PROVIDER_SITE_OTHER): Payer: Medicare Other | Admitting: Family Medicine

## 2021-07-15 ENCOUNTER — Encounter: Payer: Self-pay | Admitting: Family Medicine

## 2021-07-15 VITALS — BP 130/60 | HR 55 | Temp 97.6°F | Ht 72.0 in | Wt 180.2 lb

## 2021-07-15 DIAGNOSIS — I872 Venous insufficiency (chronic) (peripheral): Secondary | ICD-10-CM | POA: Diagnosis not present

## 2021-07-15 DIAGNOSIS — S51811A Laceration without foreign body of right forearm, initial encounter: Secondary | ICD-10-CM | POA: Diagnosis not present

## 2021-07-15 DIAGNOSIS — S81812A Laceration without foreign body, left lower leg, initial encounter: Secondary | ICD-10-CM

## 2021-07-15 MED ORDER — APIXABAN 2.5 MG PO TABS: 2.5000 mg | ORAL_TABLET | Freq: Two times a day (BID) | ORAL | 0 refills | Status: DC

## 2021-07-15 MED ORDER — CEPHALEXIN 500 MG PO CAPS: 500.0000 mg | ORAL_CAPSULE | Freq: Two times a day (BID) | ORAL | 0 refills | Status: DC

## 2021-07-15 NOTE — Progress Notes (Signed)
Subjective:     [name], male    DOB: 26-Jun-1928, 88 y.o.   MRN: [id number]  Chief Complaint  Patient presents with   Skin Tear    Skin tear on left leg, swelling and possible infection Skin tear on right elbow     HPI-here w/dau Dolores Lory Chronic aphasia and hoh Can't find eliquis and lexapro.  Has some home caregivers.  Nurse saw him for wounds-thinks worse.  Long term wounds.  Red and warm.  Has been to wound ctr.  Intermitt swelling and wound. No worsening confusion.  No f/c/n/v.     There are no preventive care reminders to display for this patient.  Past Medical History:  Diagnosis Date   Actinic keratosis    BPH with urinary obstruction    Bradycardia    CAD (coronary artery disease)    CABG x5   Cerebrovascular accident (stroke) ([place])    With right hemiparesis and persistent expressive aphasia   GERD (gastroesophageal reflux disease)    History of hydronephrosis    Hx of hydronephrosis 07/08/2011   right    Hyperlipidemia    Hypertension    Iron deficiency anemia due to chronic blood loss 03/26/2007   Now resolved     Iron deficiency anemia secondary to blood loss (chronic)    Persistent atrial fibrillation ([place])    Pulmonary hypertension ([place])    by echo 2013   Rosacea    Situational depression    Severe   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   treated after biopsy   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   [facility] - treated after biopsy   Tricuspid regurgitation     Past Surgical History:  Procedure Laterality Date   ABDOMINAL SURGERY     BACK SURGERY     BAND HEMORRHOIDECTOMY     CHOLECYSTECTOMY     CORONARY ARTERY BYPASS GRAFT     x 5   dental implants     HERNIA REPAIR     KIDNEY SURGERY     stenosis with stent   TONSILLECTOMY      Outpatient Medications Prior to Visit  Medication Sig Dispense Refill   amLODipine (NORVASC) 2.5 MG tablet TAKE 1 TABLET(2.5 MG) BY MOUTH DAILY 90  tablet 3   ELIQUIS 2.5 MG TABS tablet TAKE 1 TABLET(2.5 MG) BY MOUTH TWICE DAILY 60 tablet 9   escitalopram (LEXAPRO) 20 MG tablet TAKE 1 TABLET BY MOUTH DAILY 90 tablet 1   Ferrous Sulfate (IRON PO) Take 1 tablet by mouth daily.     furosemide (LASIX) 40 MG tablet Take 1 tablet (40 mg total) by mouth daily. 90 tablet 3   Lansoprazole (PREVACID PO) Take 15 mg by mouth daily.     memantine (NAMENDA) 10 MG tablet TAKE 1 TABLET BY MOUTH DAILY 90 tablet 3   Multiple Vitamin (MULTIVITAMIN) tablet Take 1 tablet by mouth daily.     rosuvastatin (CRESTOR) 20 MG tablet TAKE 1 TABLET BY MOUTH EVERY DAY 90 tablet 3   tamsulosin (FLOMAX) 0.4 MG CAPS capsule TAKE 1 CAPSULE BY MOUTH AT BEDTIME 90 capsule 3   triamcinolone cream (KENALOG) 0.1 % Apply 1 application topically 2 (two) times daily. For 7-10 days maximum 160 g 0   No facility-administered medications prior to visit.    Allergies  Allergen Reactions   Ciprofloxacin Hcl     unknown   Sulfamethoxazole-Trimethoprim  unknown   ROS neg/noncontributory except as noted HPI/below      Objective:     BP 130/60   Pulse (!) 55   Temp 97.6 F (36.4 C) (Temporal)   Ht 6' (1.829 m)   Wt 180 lb 4 oz (81.8 kg)   SpO2 98%   BMI 24.45 kg/m  Wt Readings from Last 3 Encounters:  07/15/21 180 lb 4 oz (81.8 kg)  06/14/21 182 lb 2 oz (82.6 kg)  05/13/21 184 lb (83.5 kg)    Physical Exam   Gen: WDWN NAD non verbal but follows instructions HEENT: NCAT, conjunctiva not injected, sclera nonicteric EXT:  <1+ edema MSK: . cane NEURO: A&O .  CN II-XII intact.  PSYCH: normal mood. Good eye contact R post shoulder-yellow bruise w/dry, healing wound.  No s/s infection R elbow-approx 1.5cm skin tear.  No s/s infection.   RLE-chronic venous stasis hyperpigmentation and red skin-no breakdown, LLE-chronic venous stasis hyperpigmentation w/old/chronic skin tears on shin.  No d/c.  No erythema surrounding.  Per daughter-baseline.      Assessment &  Plan:   Problem List Items Addressed This Visit       Cardiovascular and Mediastinum   Venous insufficiency   Other Visit Diagnoses     Skin tear of forearm without complication, right, initial encounter    -  Primary   Skin tear of lower leg without complication, left, initial encounter          Prob fall based on old bruise and healing wounds R shoulder area.  H/o CVA.  Monitor Skin tear R elbow area-monitor.  New band-aid applies Chronic wounds LLE-nurse was concerned infection-per daughter stable.  Drsg was wet-advised to keep dry.  New drsg applied.  Keflex to hold if gets worse, red/warm, d/c, etc.   Advised daughter to monitor meds more-she will try to find missing bottles  Meds ordered this encounter  Medications   cephALEXin (KEFLEX) 500 MG capsule    Sig: Take 1 capsule (500 mg total) by mouth 2 (two) times daily for 7 days. Take for 7 days    Dispense:  14 capsule    Refill:  0    Wellington Hampshire, MD

## 2021-07-15 NOTE — Telephone Encounter (Addendum)
Pt's daughter states the Eliquis cannot be found.   No statement about pt taking too many doses.  Instructed pt to call prescribing NP at [facility] location.   Stated that a short script for Eliquis can be written, but that INS may not cover the fill. Daughter stated understanding.   Front Office Rep does not know any other options for daughter.

## 2021-07-15 NOTE — Patient Instructions (Signed)
It was very nice to see you today!  Keep dressing more dry Keflex to hold   PLEASE NOTE:  If you had any lab tests please let us know if you have not heard back within a few days. You may see your results on MyChart before we have a chance to review them but we will give you a call once they are reviewed by [place]. If we ordered any referrals today, please let us know if you have not heard from their office within the next week.   Please try these tips to maintain a healthy lifestyle:  Eat most of your calories during the day when you are active. Eliminate processed foods including packaged sweets (pies, cakes, cookies), reduce intake of potatoes, white bread, white pasta, and white rice. Look for whole grain options, oat flour or almond flour.  Each meal should contain half fruits/vegetables, one quarter protein, and one quarter carbs (no bigger than a computer mouse).  Cut down on sweet beverages. This includes juice, soda, and sweet tea. Also watch fruit intake, though this is a healthier sweet option, it still contains natural sugar! Limit to 3 servings daily.  Drink at least 1 glass of water with each meal and aim for at least 8 glasses per day  Exercise at least 150 minutes every week.

## 2021-07-18 NOTE — Telephone Encounter (Signed)
Called and spoke with pt daughter and she states everything has been taken care of.

## 2021-07-28 ENCOUNTER — Other Ambulatory Visit: Payer: Self-pay | Admitting: Family Medicine

## 2021-08-12 ENCOUNTER — Telehealth: Payer: Self-pay | Admitting: Family Medicine

## 2021-08-12 ENCOUNTER — Other Ambulatory Visit (HOSPITAL_COMMUNITY): Payer: Self-pay | Admitting: Nurse Practitioner

## 2021-08-12 NOTE — Telephone Encounter (Signed)
Patient's daughter Dolores Lory called regarding Patient is out of memantine (NAMENDA) 10 MG tablet, however, Dolores Lory had the RX filled on 06/19/21 and does not know why he is missing one month worth of the medication. Dolores Lory requests to be called at ph# [phone] to be advised that it is okay for Patient to not take the above named medication for 1 month.

## 2021-08-15 NOTE — Telephone Encounter (Signed)
See below, can you advise in Dr. Ronney Lion absence?

## 2021-08-15 NOTE — Telephone Encounter (Signed)
Called and spoke with pt pharmacy and pharmacist states this Rx was infact picked up on 04/23 and it is to early to have filled. Pt can either pay OOP or wait until July 1 to pick it up when insurance will approve it. Spoke with pt daughter and made her aware.

## 2021-09-07 ENCOUNTER — Encounter: Payer: Self-pay | Admitting: Family

## 2021-09-07 ENCOUNTER — Ambulatory Visit (INDEPENDENT_AMBULATORY_CARE_PROVIDER_SITE_OTHER): Payer: Medicare Other | Admitting: Family

## 2021-09-07 VITALS — BP 138/52 | HR 49 | Temp 98.1°F | Ht 72.0 in | Wt 179.0 lb

## 2021-09-07 DIAGNOSIS — M25552 Pain in left hip: Secondary | ICD-10-CM | POA: Diagnosis not present

## 2021-09-07 NOTE — Patient Instructions (Signed)
It was very nice to see you today!   I have sent the order for an xray of his left hip. If you feel his pain is worsening and not improving, go get the xray. It is first come, first serve and we have provided you the address.  He can continue to take the Tylenol up to 1,'000mg'$  3 times per day for the next few days as needed. OK to apply Icyhot to the hip, and/or a heating pad for up to 77mnutes several times a day to help with pain. Be sure the pad has an automatic shut off.       PLEASE NOTE:  If you had any lab tests please let [place] if you have not heard back within a few days. You may see your results on MyChart before we have a chance to review them but we will give you a call once they are reviewed by [place] If we ordered any referrals today, please let [place] if you have not heard from their office within the next week.

## 2021-09-07 NOTE — Progress Notes (Signed)
[name], male    DOB: Feb 04, 1929, 88 y.o.   MRN: [id number]  Chief Complaint  Patient presents with   Hip Pain    Pt c/o left hip pain since yesterday, pt is non verbal but keeps pointing at that area. Has tried tylenol but not sure if it helps. Not sure if pt fell.     HPI: Left hip pain:  points to hip, but denies falling. pt is resident at [facility] and has cg during day but not at night. pt dtr with him today, she witnessed him standing and wincing in pain. Has tried Tylenol with mild relief, says yes when asked if using Icyhot that he has at home.  Assessment & Plan:  1. Acute hip pain, left pt with dtr, pt unable to speak, but understands what is spoken to him and nods yes or no. Denies falling or injury of any sort. pt in no obvious pain currently, no bruises noted or pain w/palpation. Advised to take Tylenol up to 1,'000mg'$  tid for next few days, ok to apply Icyhot (has at home) tid and heat up to 80mn tid. If pain no better or worsens over next 1-2 days, I have sent order for xray and advised dtr of where to go.  - DG HIP UNILAT W OR W/O PELVIS 2-3 VIEWS LEFT; Future   Subjective:    Outpatient Medications Prior to Visit  Medication Sig Dispense Refill   amLODipine (NORVASC) 2.5 MG tablet TAKE 1 TABLET(2.5 MG) BY MOUTH DAILY 90 tablet 3   ELIQUIS 2.5 MG TABS tablet TAKE 1 TABLET(2.5 MG) BY MOUTH TWICE DAILY 60 tablet 11   escitalopram (LEXAPRO) 20 MG tablet TAKE 1 TABLET BY MOUTH DAILY 90 tablet 1   Ferrous Sulfate (IRON PO) Take 1 tablet by mouth daily.     furosemide (LASIX) 40 MG tablet Take 1 tablet (40 mg total) by mouth daily. 90 tablet 3   Lansoprazole (PREVACID PO) Take 15 mg by mouth daily.     memantine (NAMENDA) 10 MG tablet TAKE 1 TABLET BY MOUTH DAILY 90 tablet 3   Multiple Vitamin (MULTIVITAMIN) tablet Take 1 tablet by mouth daily.     rosuvastatin (CRESTOR) 20 MG tablet TAKE 1 TABLET BY MOUTH EVERY DAY 90 tablet 3   tamsulosin (FLOMAX) 0.4  MG CAPS capsule TAKE 1 CAPSULE BY MOUTH AT BEDTIME 90 capsule 3   triamcinolone cream (KENALOG) 0.1 % Apply 1 application topically 2 (two) times daily. For 7-10 days maximum 160 g 0   No facility-administered medications prior to visit.   Past Medical History:  Diagnosis Date   Actinic keratosis    BPH with urinary obstruction    Bradycardia    CAD (coronary artery disease)    CABG x5   [place]    With right hemiparesis and persistent expressive aphasia   GERD (gastroesophageal reflux disease)    History of hydronephrosis    Hx of hydronephrosis 07/08/2011   right    Hyperlipidemia    Hypertension    Iron deficiency anemia due to chronic blood loss 03/26/2007   Now resolved     Iron deficiency anemia secondary to blood loss (chronic)    Persistent atrial fibrillation ([place]    Pulmonary hypertension ([place]    by echo 2013   Rosacea    Situational depression    Severe   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  Constitutional:      General: He is not in acute distress.    Appearance: Normal appearance.  HENT:     Head: Normocephalic.  Cardiovascular:     Rate and Rhythm: Normal rate and regular rhythm.  Pulmonary:     Effort: Pulmonary effort is normal.     Breath sounds: Normal breath sounds.  Musculoskeletal:      Cervical back: Normal range of motion.     Left hip: Decreased range of motion. Decreased strength (upon standing, pain w/walking, using walker today vs cane for support).  Skin:    General: Skin is warm and dry.  Neurological:     Mental Status: He is alert and oriented to person, place, and time.  Psychiatric:        Mood and Affect: Mood normal.    BP (!) 138/52 (BP Location: Left Arm, Patient Position: Sitting, Cuff Size: Large)   Pulse (!) 49   Temp 98.1 F (36.7 C) (Temporal)   Ht 6' (1.829 m)   Wt 179 lb (81.2 kg)   SpO2 99%   BMI 24.28 kg/m  Wt Readings from Last 3 Encounters:  09/07/21 179 lb (81.2 kg)  07/15/21 180 lb 4 oz (81.8 kg)  06/14/21 182 lb 2 oz (82.6 kg)       Jeanie Sewer, NP

## 2021-09-10 ENCOUNTER — Other Ambulatory Visit: Payer: Self-pay | Admitting: Family Medicine

## 2021-09-12 ENCOUNTER — Ambulatory Visit (INDEPENDENT_AMBULATORY_CARE_PROVIDER_SITE_OTHER)
Admission: RE | Admit: 2021-09-12 | Discharge: 2021-09-12 | Disposition: A | Discharge status: Home / Self Care | Payer: Medicare Other | Source: Ambulatory Visit | Attending: Family | Admitting: Family

## 2021-09-12 DIAGNOSIS — M1612 Unilateral primary osteoarthritis, left hip: Secondary | ICD-10-CM | POA: Diagnosis not present

## 2021-09-12 DIAGNOSIS — I878 Other specified disorders of veins: Secondary | ICD-10-CM | POA: Diagnosis not present

## 2021-09-12 DIAGNOSIS — M25552 Pain in left hip: Secondary | ICD-10-CM | POA: Diagnosis not present

## 2021-09-12 DIAGNOSIS — M47816 Spondylosis without myelopathy or radiculopathy, lumbar region: Secondary | ICD-10-CM | POA: Diagnosis not present

## 2021-09-12 DIAGNOSIS — I739 Peripheral vascular disease, unspecified: Secondary | ICD-10-CM | POA: Diagnosis not present

## 2021-09-13 ENCOUNTER — Telehealth: Payer: Self-pay | Admitting: Family Medicine

## 2021-09-13 NOTE — Telephone Encounter (Signed)
Patient's daughter Dolores Lory requests to be called at ph# [phone] to be given XRAY results from 03/15/21.

## 2021-09-13 NOTE — Telephone Encounter (Signed)
Pt's daughter called and I let her know that Colletta Maryland has not reviewed Xray yet and I will call when she does. Pt's daughter gave a verbal understanding.

## 2021-09-13 NOTE — Progress Notes (Signed)
Call pt family - pt does not speak - not able.  The xray can not exclude a possible small fracture and a MRI is suggested, but would prefer pt to follow up with Orthopedics. Ask if he has been seen before by someone or we can refer to [place].  Thx

## 2021-09-13 NOTE — Telephone Encounter (Signed)
FYI---Diane from [facility] called in regards to completion of patient's hip x ray on 09/12/21. States she wanted to make sure we received this report in our system. I was able to confirm this was visible in the patient's chart.

## 2021-09-13 NOTE — Telephone Encounter (Signed)
Pt saw stephanie yesterday and she ordered xray, can you follow up with pt daughter once Colletta Maryland has commented on results.

## 2021-09-13 NOTE — Telephone Encounter (Signed)
Lvm for pt to call back. 

## 2021-09-14 ENCOUNTER — Other Ambulatory Visit: Payer: Self-pay

## 2021-09-14 DIAGNOSIS — M25552 Pain in left hip: Secondary | ICD-10-CM

## 2021-09-15 ENCOUNTER — Encounter: Payer: Self-pay | Admitting: Orthopaedic Surgery

## 2021-09-15 ENCOUNTER — Ambulatory Visit (INDEPENDENT_AMBULATORY_CARE_PROVIDER_SITE_OTHER): Payer: Medicare Other | Admitting: Orthopaedic Surgery

## 2021-09-15 DIAGNOSIS — M25552 Pain in left hip: Secondary | ICD-10-CM | POA: Diagnosis not present

## 2021-09-15 NOTE — Progress Notes (Signed)
The patient is a 88 year old gentleman who is brought in with his daughter to evaluate and treat acute left hip pain.  He has been seen recently by Dr. Felipe Drone who obtained x-rays of his left hip.  Those x-rays were suspicious for potentially cortical irregularity in the superior femoral neck.  The patient's daughter is unaware if he has had any type of accident.  The patient does have aphasia and is also on Eliquis.  She states that he was favoring his hip about a week to 2 weeks ago and is eventually gone from a walker to now and motorized scooter due to some type of pain that seems to be exhibiting along his left hip or left lower extremity.  It is appropriate that he sees orthopedics at this point to help ascertain what is the source of his pain and what treatment options there are for this issue.  On exam he seems to point to his left lower extremity as a source of his pain and seems to be more when I rotate his hip.  He has just some slight guarding I can rotate his hip easily and is hard to tell this coming from his hip or his knee.  There is no knee joint effusion of the left knee.  There is no blocks to rotation of his left hip.  I did look at all the x-rays on the canopy system of his left hip and I can see where the radiologist may be concerned about a slight cortical irregularity but it is minimal and it is not seen on all the x-rays at all.  I am not sure if this is truly a fracture or not.  The best treatment step at this standpoint will be to obtain a MRI of the left hip to rule out a fracture.  I did see on the plain films that he has severe degenerative changes of the lower lumbar spine and he may be having a radicular component of pain.  Still, with his aphasia is really hard to get an idea of where the pain is actually from.  He is highly mobile and is motivated and wants to ambulate.  We need him to go slow for now and just use the motorized scooter until we have a diagnosis.  We will try  to obtain the MRI of his left hip soon and then make recommendations from there in terms of what are potential treatment options.  All question concerns were answered and addressed.  We can call his daughter with the results of the MRI and go from there in terms of what the next recommended treatments may be.

## 2021-09-15 NOTE — Addendum Note (Signed)
Addended by: Robyne Peers on: 09/15/2021 04:30 PM   Modules accepted: Orders

## 2021-09-17 ENCOUNTER — Emergency Department (HOSPITAL_BASED_OUTPATIENT_CLINIC_OR_DEPARTMENT_OTHER)
Admission: EM | Admit: 2021-09-17 | Discharge: 2021-09-17 | Disposition: A | Discharge status: Home / Self Care | Payer: Medicare Other | Attending: Emergency Medicine | Admitting: Emergency Medicine

## 2021-09-17 ENCOUNTER — Emergency Department (HOSPITAL_BASED_OUTPATIENT_CLINIC_OR_DEPARTMENT_OTHER): Payer: Medicare Other

## 2021-09-17 ENCOUNTER — Encounter (HOSPITAL_BASED_OUTPATIENT_CLINIC_OR_DEPARTMENT_OTHER): Payer: Self-pay | Admitting: Emergency Medicine

## 2021-09-17 ENCOUNTER — Other Ambulatory Visit: Payer: Self-pay

## 2021-09-17 DIAGNOSIS — W19XXXA Unspecified fall, initial encounter: Secondary | ICD-10-CM | POA: Insufficient documentation

## 2021-09-17 DIAGNOSIS — S40011A Contusion of right shoulder, initial encounter: Secondary | ICD-10-CM | POA: Insufficient documentation

## 2021-09-17 DIAGNOSIS — M79671 Pain in right foot: Secondary | ICD-10-CM | POA: Diagnosis not present

## 2021-09-17 DIAGNOSIS — Z7901 Long term (current) use of anticoagulants: Secondary | ICD-10-CM | POA: Insufficient documentation

## 2021-09-17 DIAGNOSIS — M25552 Pain in left hip: Secondary | ICD-10-CM | POA: Insufficient documentation

## 2021-09-17 DIAGNOSIS — M542 Cervicalgia: Secondary | ICD-10-CM | POA: Insufficient documentation

## 2021-09-17 DIAGNOSIS — S0990XA Unspecified injury of head, initial encounter: Secondary | ICD-10-CM | POA: Diagnosis not present

## 2021-09-17 DIAGNOSIS — S8991XA Unspecified injury of right lower leg, initial encounter: Secondary | ICD-10-CM | POA: Diagnosis present

## 2021-09-17 DIAGNOSIS — S82831A Other fracture of upper and lower end of right fibula, initial encounter for closed fracture: Secondary | ICD-10-CM | POA: Diagnosis not present

## 2021-09-17 DIAGNOSIS — S82839A Other fracture of upper and lower end of unspecified fibula, initial encounter for closed fracture: Secondary | ICD-10-CM

## 2021-09-17 DIAGNOSIS — S79911A Unspecified injury of right hip, initial encounter: Secondary | ICD-10-CM | POA: Diagnosis not present

## 2021-09-17 DIAGNOSIS — Z79899 Other long term (current) drug therapy: Secondary | ICD-10-CM | POA: Insufficient documentation

## 2021-09-17 DIAGNOSIS — M25551 Pain in right hip: Secondary | ICD-10-CM | POA: Diagnosis not present

## 2021-09-17 DIAGNOSIS — S79912A Unspecified injury of left hip, initial encounter: Secondary | ICD-10-CM | POA: Diagnosis not present

## 2021-09-17 DIAGNOSIS — S199XXA Unspecified injury of neck, initial encounter: Secondary | ICD-10-CM | POA: Diagnosis not present

## 2021-09-17 DIAGNOSIS — M79604 Pain in right leg: Secondary | ICD-10-CM | POA: Diagnosis not present

## 2021-09-17 DIAGNOSIS — S89301A Unspecified physeal fracture of lower end of right fibula, initial encounter for closed fracture: Secondary | ICD-10-CM | POA: Diagnosis not present

## 2021-09-17 DIAGNOSIS — M7989 Other specified soft tissue disorders: Secondary | ICD-10-CM | POA: Diagnosis not present

## 2021-09-17 NOTE — ED Notes (Signed)
Patient transported to CT 

## 2021-09-17 NOTE — ED Triage Notes (Signed)
Pt has expressive aphasia. Pt has left hip possible fx that is waiting on MRI to confirm. 2 days ago pt was in his new electric wheelchair and fell backwards, his daughter has noticed he isnt putting any weight on his right leg and concerned about right leg.

## 2021-09-17 NOTE — ED Notes (Signed)
Discharge paperwork given and understood by family.

## 2021-09-17 NOTE — ED Triage Notes (Signed)
Pt is on eliaquis

## 2021-09-17 NOTE — ED Provider Notes (Signed)
[facility] EMERGENCY DEPT Provider Note   CSN: [id number] Arrival date & time: 09/17/21  1202     History  Chief Complaint  Patient presents with   Leg Injury    [name] is a 88 y.o. male.  Patient is a 88 year old male who presents with leg pain after a fall.  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  He recently had some pain to his left hip.  He has some ongoing pain to his left hip and he went to his PCP with follow-up with an orthopedist this past week regarding the left hip pain.  He had some cortical irregularity that was questioning a subcapital hip fracture and MRI was recommended.  He is scheduled to have MRI of the hip but it has not yet been done.  He has been ambulatory and he normally walks with a cane but since his left hip pain has been occurring, he has been walking with a rolling walker.  3 days ago he had a fall where he was sitting in a chair and fell backward and over to his right side.  He has been complaining of some pain in his right leg since that fall and has not been as ambulatory.  He has been getting around in a wheelchair since that time.  He seems to have pain in his lower leg mostly.  He has some chronic edema to his right lower leg from the prior stroke but his daughter says that this is unchanged from his baseline and actually the swellings little bit better than his baseline.  He has a healing wound for which she is going to the wound care clinic.  He did hit his head when he fell and he does complain of a little bit of neck pain as well.  He is on Eliquis per chart review.       Home Medications Prior to Admission medications   Medication Sig Start Date End Date Taking? Authorizing Provider  amLODipine (NORVASC) 2.5 MG tablet TAKE 1 TABLET(2.5 MG) BY MOUTH DAILY 04/07/21   Vivi Barrack, MD  ELIQUIS 2.5 MG TABS tablet TAKE 1 TABLET(2.5 MG) BY MOUTH TWICE DAILY 08/12/21   Sherran Needs, NP  escitalopram (LEXAPRO) 20 MG tablet TAKE 1 TABLET BY MOUTH DAILY 07/28/21   [name], MD  Ferrous Sulfate (IRON PO) Take 1 tablet by mouth daily.    [provider]  furosemide (LASIX) 40 MG tablet Take 1 tablet (40 mg total) by mouth daily. 05/13/21   Burnell Blanks, MD  Lansoprazole (PREVACID PO) Take 15 mg by mouth daily.    [provider]  memantine (NAMENDA) 10 MG tablet TAKE 1 TABLET BY MOUTH DAILY 04/12/21   [name], MD  Multiple Vitamin (MULTIVITAMIN) tablet Take 1 tablet by mouth daily.    [provider]  rosuvastatin (CRESTOR) 20 MG tablet TAKE 1 TABLET BY MOUTH EVERY DAY 09/12/21   [name], MD  tamsulosin (FLOMAX) 0.4 MG CAPS capsule TAKE 1 CAPSULE BY MOUTH AT BEDTIME 12/15/20   [name], MD  triamcinolone cream (KENALOG) 0.1 % Apply 1 application topically 2 (two) times daily. For 7-10 days maximum 11/06/19   [name], MD      Allergies    Ciprofloxacin hcl and Sulfamethoxazole-trimethoprim    Review of Systems   Review of Systems  Unable to perform ROS: Patient nonverbal  Physical Exam Updated Vital Signs BP (!) 187/65   Pulse 61   Temp (!) 97.5 F (36.4 C)   Resp 16   SpO2 95%  Physical Exam Constitutional:      Appearance: He is well-developed.  HENT:     Head: Normocephalic and atraumatic.  Eyes:     Pupils: Pupils are equal, round, and reactive to light.  Neck:     Comments: Tenderness along the cervical spine.  No pain to the thoracic or lumbosacral spine.  No step-offs or deformities. Cardiovascular:     Rate and Rhythm: Normal rate and regular rhythm.     Heart sounds: Normal heart sounds.  Pulmonary:     Effort: Pulmonary effort is normal. No respiratory distress.     Breath sounds: Normal breath sounds. No wheezing or rales.  Chest:     Chest wall: No tenderness.  Abdominal:     General: Bowel sounds are normal.     Palpations: Abdomen is soft.      Tenderness: There is no abdominal tenderness. There is no guarding or rebound.  Musculoskeletal:        General: Normal range of motion.     Comments: Positive ecchymosis to the posterior right shoulder but no pain on palpation or range of motion of the shoulder.  No pain on range of motion of either arm.  He has some pain on range of motion of his left hip.  No pain to the knee or lower leg.  He has maybe some mild pain on range of motion of the right hip but mostly his pain is to his lower leg.  He has some pain to his mid lower leg and around the ankle and foot area.  He has some 3+ pitting edema to his right lower leg and 2+ pitting edema to left lower leg.  Patient's family says this is baseline for him.  Pulses were not palpated due to the edema although patient has warm foot and good color without suggestions of decreased perfusion.  Lymphadenopathy:     Cervical: No cervical adenopathy.  Skin:    General: Skin is warm and dry.     Findings: No rash.  Neurological:     Mental Status: He is alert and oriented to person, place, and time.     ED Results / Procedures / Treatments   Labs (all labs ordered are listed, but only abnormal results are displayed) Labs Reviewed - No data to display  EKG None  Radiology CT Hip Left Wo Contrast  Result Date: 09/17/2021 CLINICAL DATA:  Hip trauma with fracture suspected.  X-ray done. EXAM: CT OF THE LEFT HIP WITHOUT CONTRAST TECHNIQUE: Multidetector CT imaging of the left hip was performed according to the standard protocol. Multiplanar CT image reconstructions were also generated. RADIATION DOSE REDUCTION: This exam was performed according to the departmental dose-optimization program which includes automated exposure control, adjustment of the mA and/or kV according to patient size and/or use of iterative reconstruction technique. COMPARISON:  Radiographs 09/17/2021 FINDINGS: Bones/Joint/Cartilage Degenerative changes in the left hip with  narrowing, sclerosis, and osteophyte formation on both sides of the hip joint. Small subcortical cysts are present. No evidence of acute fracture or dislocation. No focal bone lesion or bone destruction. Ligaments Suboptimally assessed by CT. Muscles and Tendons Mild fatty atrophy of the musculature. No intramuscular mass or hematoma. Soft tissues No soft tissue hematoma or bursal collections. Vascular calcifications. IMPRESSION: 1. No acute displaced fractures demonstrated in the left  hip. 2. Moderate degenerative changes. Electronically Signed   By: Lucienne Capers M.D.   On: 09/17/2021 18:53   CT Hip Right Wo Contrast  Result Date: 09/17/2021 CLINICAL DATA:  Hip trauma fall.  Fracture suspected. EXAM: CT OF THE RIGHT HIP WITHOUT CONTRAST TECHNIQUE: Multidetector CT imaging of the right hip was performed according to the standard protocol. Multiplanar CT image reconstructions were also generated. RADIATION DOSE REDUCTION: This exam was performed according to the departmental dose-optimization program which includes automated exposure control, adjustment of the mA and/or kV according to patient size and/or use of iterative reconstruction technique. COMPARISON:  Radiograph performed earlier on the same date FINDINGS: Bones/Joint/Cartilage No evidence of fracture or dislocation. Superolateral hip joint space narrowing with marginal spurring. No appreciable joint effusion. Ligaments Suboptimally assessed by CT. Muscles and Tendons No intramuscular hematoma or fluid collection. Iliopsoas tendon is intact. Mild tendinopathy at the hamstring origin. No appreciable tendon tear. Soft tissues Vascular calcification.  No fluid collection or hematoma. IMPRESSION: No evidence of fracture or dislocation. Mild right hip osteoarthritis.  No joint effusion. Electronically Signed   By: Keane Police D.O.   On: 09/17/2021 18:51   DG Ankle Complete Right  Result Date: 09/17/2021 CLINICAL DATA:  Fall 2 days ago. EXAM: RIGHT  ANKLE - COMPLETE 3+ VIEW COMPARISON:  None Available. FINDINGS: There is an oblique, non comminuted and nondisplaced fracture of the distal fibula extending from the metadiaphysis to the medial metaphysis at the level of the ankle joint. No other fractures.  No bone lesions. Ankle joint is normally spaced and aligned. Mild diffuse soft tissue swelling. IMPRESSION: 1. Nondisplaced and non comminuted fracture of the distal fibula. 2. Normally aligned ankle joint. Electronically Signed   By: Lajean Manes M.D.   On: 09/17/2021 18:31   CT Head Wo Contrast  Result Date: 09/17/2021 CLINICAL DATA:  Head trauma, minor (Age >= 65y); Neck trauma (Age >= 65y) EXAM: CT HEAD WITHOUT CONTRAST CT CERVICAL SPINE WITHOUT CONTRAST TECHNIQUE: Multidetector CT imaging of the head and cervical spine was performed following the standard protocol without intravenous contrast. Multiplanar CT image reconstructions of the cervical spine were also generated. RADIATION DOSE REDUCTION: This exam was performed according to the departmental dose-optimization program which includes automated exposure control, adjustment of the mA and/or kV according to patient size and/or use of iterative reconstruction technique. COMPARISON:  CT head 06/12/2008 FINDINGS: CT HEAD FINDINGS BRAIN: BRAIN Cerebral ventricle sizes are concordant with the degree of cerebral volume loss. Patchy and confluent areas of decreased attenuation are noted throughout the deep and periventricular white matter of the cerebral hemispheres bilaterally, compatible with chronic microvascular ischemic disease. Left middle cerebral artery territory chronic infarction. No evidence of large-territorial acute infarction. No parenchymal hemorrhage. No mass lesion. No extra-axial collection. No mass effect or midline shift. No hydrocephalus. Basilar cisterns are patent. Vascular: No hyperdense vessel. Skull: No acute fracture or focal lesion. Sinuses/Orbits: Left ethmoid mucosal  thickening. Otherwise paranasal sinuses and mastoid air cells are clear. Bilateral lens replacement. Other the orbits are unremarkable. Other: None. CT CERVICAL SPINE FINDINGS Alignment: Normal. Skull base and vertebrae: Fusion of the C6-C7 vertebral bodies. Multilevel degenerative changes of the. No acute fracture. No aggressive appearing focal osseous lesion or focal pathologic process. Soft tissues and spinal canal: No prevertebral fluid or swelling. No visible canal hematoma. Upper chest: Biapical paraseptal emphysematous changes. Other: Atherosclerotic plaque of the aortic arch. Athero sclerotic plaque of the carotid arteries within the neck. IMPRESSION: 1. No acute intracranial  abnormality. 2. No acute displaced fracture or traumatic listhesis of the cervical spine. 3.  Emphysema (ICD10-J43.9). Electronically Signed   By: Iven Finn M.D.   On: 09/17/2021 17:33   CT Cervical Spine Wo Contrast  Result Date: 09/17/2021 CLINICAL DATA:  Head trauma, minor (Age >= 65y); Neck trauma (Age >= 65y) EXAM: CT HEAD WITHOUT CONTRAST CT CERVICAL SPINE WITHOUT CONTRAST TECHNIQUE: Multidetector CT imaging of the head and cervical spine was performed following the standard protocol without intravenous contrast. Multiplanar CT image reconstructions of the cervical spine were also generated. RADIATION DOSE REDUCTION: This exam was performed according to the departmental dose-optimization program which includes automated exposure control, adjustment of the mA and/or kV according to patient size and/or use of iterative reconstruction technique. COMPARISON:  CT head 06/12/2008 FINDINGS: CT HEAD FINDINGS BRAIN: BRAIN Cerebral ventricle sizes are concordant with the degree of cerebral volume loss. Patchy and confluent areas of decreased attenuation are noted throughout the deep and periventricular white matter of the cerebral hemispheres bilaterally, compatible with chronic microvascular ischemic disease. Left middle cerebral  artery territory chronic infarction. No evidence of large-territorial acute infarction. No parenchymal hemorrhage. No mass lesion. No extra-axial collection. No mass effect or midline shift. No hydrocephalus. Basilar cisterns are patent. Vascular: No hyperdense vessel. Skull: No acute fracture or focal lesion. Sinuses/Orbits: Left ethmoid mucosal thickening. Otherwise paranasal sinuses and mastoid air cells are clear. Bilateral lens replacement. Other the orbits are unremarkable. Other: None. CT CERVICAL SPINE FINDINGS Alignment: Normal. Skull base and vertebrae: Fusion of the C6-C7 vertebral bodies. Multilevel degenerative changes of the. No acute fracture. No aggressive appearing focal osseous lesion or focal pathologic process. Soft tissues and spinal canal: No prevertebral fluid or swelling. No visible canal hematoma. Upper chest: Biapical paraseptal emphysematous changes. Other: Atherosclerotic plaque of the aortic arch. Athero sclerotic plaque of the carotid arteries within the neck. IMPRESSION: 1. No acute intracranial abnormality. 2. No acute displaced fracture or traumatic listhesis of the cervical spine. 3.  Emphysema (ICD10-J43.9). Electronically Signed   By: Iven Finn M.D.   On: 09/17/2021 17:33   DG Tibia/Fibula Right  Result Date: 09/17/2021 CLINICAL DATA:  Fall.  Right leg pain. EXAM: RIGHT TIBIA AND FIBULA - 2 VIEW COMPARISON:  None Available. FINDINGS: Suggested nondisplaced fracture of the distal fibula at the level of the ankle joint. No other evidence of a fracture.  No bone lesion. Knee and ankle joints are normally aligned. Subcutaneous soft tissue edema extends from the leg from the ankle. Medial vascular clips are consistent with a previous vein harvesting procedure. IMPRESSION: 1. Possible, but not definitive, nondisplaced fracture of the distal fibula. Recommend follow-up right ankle radiographs. 2. No other evidence of a fracture.  No dislocation Electronically Signed   By: Lajean Manes M.D.   On: 09/17/2021 17:29   DG HIPS BILAT WITH PELVIS 3-4 VIEWS  Result Date: 09/17/2021 CLINICAL DATA:  Fall.  Hip pain. EXAM: DG HIP (WITH OR WITHOUT PELVIS) 3-4V BILAT COMPARISON:  None Available. FINDINGS: No fracture.  No bone lesion. Hip joints, SI joints and pubic symphysis are normally aligned. Skeletal structures are demineralized. There are scattered arterial atherosclerotic calcifications. Soft tissues are otherwise unremarkable. IMPRESSION: No fracture or dislocation. Electronically Signed   By: Lajean Manes M.D.   On: 09/17/2021 17:27   DG Foot Complete Right  Result Date: 09/17/2021 CLINICAL DATA:  Fall.  Right foot pain. EXAM: RIGHT FOOT COMPLETE - 3+ VIEW COMPARISON:  None Available. FINDINGS: No fracture or bone  lesion.  Skeletal structures are demineralized. Joints are normally aligned. Dorsal soft tissue swelling. IMPRESSION: No fracture or dislocation. Electronically Signed   By: Lajean Manes M.D.   On: 09/17/2021 17:26   DG Femur Min 2 Views Right  Result Date: 09/17/2021 CLINICAL DATA:  fall, left hip pain, right leg pain EXAM: RIGHT FEMUR 2 VIEWS COMPARISON:  None Available. FINDINGS: There is no evidence of fracture or other focal bone lesions. Soft tissues are unremarkable. Vascular calcifications. Vascular clips overlie the distal thigh and proximal leg. IMPRESSION: No acute displaced fracture or dislocation. Electronically Signed   By: Iven Finn M.D.   On: 09/17/2021 17:25    Procedures Procedures    Medications Ordered in ED Medications - No data to display  ED Course/ Medical Decision Making/ A&P                           Medical Decision Making Amount and/or Complexity of Data Reviewed Radiology: ordered.   Patient is a 88 year old who presents with pain in his legs.  He has been having some pain in his left hip without a known fall and has seen orthopedics.  He is awaiting an MRI of his hip.  He then had a fall and has pain in his left  leg.  He does not really have much tenderness on range of motion of the hip.  He does have some pain in the lower leg although it is difficult to ascertain exactly where it is coming from as he has limited verbal ability.  He is on Eliquis so a CT scan of his head was done as well as a cervical spine due to his neck pain.  These show no acute abnormalities.  No intracranial hemorrhage.  X-rays were done of both hips which did not reveal any obvious fracture.  X-rays were done of the lower leg including the knee tib-fib and foot.  These were interpreted by me and confirmed by the radiologist to show a questionable fracture of the distal fibula.  Dedicated x-ray of the ankle was done which show a distal fibula fracture.  It is nondisplaced.  This was interpreted by me.  CT scans of both hips were done to assess for occult fracture.  We do not have MRI capability at this facility.  There is no evidence of fracture on the CT scans.  He was placed in a cam walker on his right leg.  Findings were discussed with the patient and the patient's daughter.  She will arrange follow-up appointment with Dr. [name].  Return precautions were given.  Final Clinical Impression(s) / ED Diagnoses Final diagnoses:  Closed fracture of distal end of fibula, unspecified fracture morphology, initial encounter    Rx / DC Orders ED Discharge Orders     None         Malvin Johns, MD 09/17/21 1939

## 2021-09-17 NOTE — Discharge Instructions (Signed)
Wear the boot as discussed.  You can use Tylenol for symptomatic relief.  Follow-up with Dr. [name] with orthopedics as discussed.  Return to the emergency room if you have any worsening symptoms.

## 2021-09-21 DIAGNOSIS — N1832 Chronic kidney disease, stage 3b: Secondary | ICD-10-CM | POA: Diagnosis not present

## 2021-09-23 DIAGNOSIS — N2581 Secondary hyperparathyroidism of renal origin: Secondary | ICD-10-CM | POA: Diagnosis not present

## 2021-09-23 DIAGNOSIS — D631 Anemia in chronic kidney disease: Secondary | ICD-10-CM | POA: Diagnosis not present

## 2021-09-23 DIAGNOSIS — I129 Hypertensive chronic kidney disease with stage 1 through stage 4 chronic kidney disease, or unspecified chronic kidney disease: Secondary | ICD-10-CM | POA: Diagnosis not present

## 2021-09-23 DIAGNOSIS — N184 Chronic kidney disease, stage 4 (severe): Secondary | ICD-10-CM | POA: Diagnosis not present

## 2021-09-28 ENCOUNTER — Ambulatory Visit (INDEPENDENT_AMBULATORY_CARE_PROVIDER_SITE_OTHER): Payer: Medicare Other

## 2021-09-28 ENCOUNTER — Encounter: Payer: Self-pay | Admitting: Physician Assistant

## 2021-09-28 ENCOUNTER — Ambulatory Visit (INDEPENDENT_AMBULATORY_CARE_PROVIDER_SITE_OTHER): Payer: Medicare Other | Admitting: Physician Assistant

## 2021-09-28 DIAGNOSIS — S82831D Other fracture of upper and lower end of right fibula, subsequent encounter for closed fracture with routine healing: Secondary | ICD-10-CM

## 2021-09-28 DIAGNOSIS — I872 Venous insufficiency (chronic) (peripheral): Secondary | ICD-10-CM

## 2021-09-28 NOTE — Progress Notes (Signed)
Office Visit Note   Patient: [name]           Date of Birth: 08-May-1928           MRN: [id number] Visit Date: 09/28/2021              Requested by: [name], MD [place],  [place] [id number] PCP: [name], MD   Assessment & Plan: Visit Diagnoses:  1. Closed fracture of distal end of right fibula with routine healing, unspecified fracture morphology, subsequent encounter   2. Venous insufficiency (chronic) (peripheral)     Plan: Given patient's reluctance to wear the cam walker boot and also due to the fact that he is a fall risk recommend minimal weightbearing on the ankle but we did supply him with an ASO brace which he is to wear at all times except for bathing.  Also he is given compression socks to help with his venous insufficiency edema.  Elevation bilateral legs and wiggling toes encouraged.  We will see him back in 4 weeks and repeat 3 views of the right ankle at that time.  Follow-Up Instructions: Return in about 4 weeks (around 10/26/2021) for Radiographs.   Orders:  Orders Placed This Encounter  Procedures   XR Ankle Complete Right   No orders of the defined types were placed in this encounter.     Procedures: No procedures performed   Clinical Data: No additional findings.   Subjective: Chief Complaint  Patient presents with   Right Ankle - Pain, Injury    HPI Patient is a 88 year old male comes in today with his daughter.  He suffers from aphasia secondary to prior stroke and also has residual hemiparalysis right side.  He had been been seen by Dr. [name] on 09/15/2021 due to left hip pain and an MRI was ordered.  However before the MRI was completed he was seen in the ER for his ankle.  3 days prior to being seen in the ER on 09/17/2021 he was sitting in a motorized scooter and fell backwards onto his right side.  He has not been able to ambulate since then.  He was seen in the ER where radiographs of his right ankle  revealed a nondisplaced distal fibula fracture at the level of the ankle joint.  No other fractures identified.  The talus was well located within the ankle mortise.  He was placed in a cam walker boot.  But has refused to wear it per his daughter.  While he was in the ER CT scans were performed of both hips and showed no acute fractures but some arthritic changes of both hips. Review of Systems See HPI otherwise negative  Objective: Vital Signs: There were no vitals taken for this visit.  Physical Exam Constitutional:      Appearance: He is normal weight. He is not ill-appearing or diaphoretic.  Pulmonary:     Effort: Pulmonary effort is normal.     Ortho Exam Left ankle tenderness over the distal fibula.  There is no tenting of the skin.  Venous changes in both legs slightly more so in the right leg.  Calves are supple bilaterally. Specialty Comments:  No specialty comments available.  Imaging: XR Ankle Complete Right  Result Date: 09/28/2021 Right ankle 3 views: Distal fibula fracture remains unchanged in overall position alignment.  Minimal early consolidation.  Talus well located within the ankle mortise.  No diastases.    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    Left wrist pain 11/01/2016   Venous insufficiency 10/02/2016   Right hydrocele 04/03/2016   Thrombocytopenia ([place]) 11/22/2015   CKD (chronic kidney disease), stage III ([place]) 11/22/2015   Sensorineural hearing loss (SNHL), bilateral 07/06/2015   Memory loss 07/21/2014   Bradycardia 07/09/2011   Chronic atrial fibrillation ([place]) 07/08/2011   Actinic keratosis 07/30/2009   Major depression in remission ([place]) 07/26/2007   BPH with obstruction/lower urinary  tract symptoms 04/26/2007   Hemiparesis affecting right side as late effect of cerebrovascular accident ([place]) 03/26/2007   Hyperlipidemia 08/23/2006   Essential hypertension 08/23/2006   CAD (coronary artery disease) 08/23/2006   GERD 08/23/2006   Past Medical History:  Diagnosis Date   Actinic keratosis    BPH with urinary obstruction    Bradycardia    CAD (coronary artery disease)    CABG x5   Cerebrovascular accident (stroke) ([place])    With right hemiparesis and persistent expressive aphasia   GERD (gastroesophageal reflux disease)    History of hydronephrosis    Hx of hydronephrosis 07/08/2011   right    Hyperlipidemia    Hypertension    Iron deficiency anemia due to chronic blood loss 03/26/2007   Now resolved     Iron deficiency anemia secondary to blood loss (chronic)    Persistent atrial fibrillation ([place])    Pulmonary hypertension ([place])    by echo 2013   Rosacea    Situational depression    Severe   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   treated after biopsy   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   KA - treated after biopsy   Tricuspid regurgitation     Family History  Problem Relation Age of Onset   Heart disease Father    Heart attack Other        fhx   Coronary artery disease Other        fhx    Past Surgical History:  Procedure Laterality Date   ABDOMINAL SURGERY     BACK SURGERY     BAND HEMORRHOIDECTOMY     CHOLECYSTECTOMY     CORONARY ARTERY BYPASS GRAFT     x 5   dental implants     HERNIA REPAIR     KIDNEY SURGERY     stenosis with stent   TONSILLECTOMY     Social History   Occupational History   Not on file  Tobacco Use   Smoking status: Former   Smokeless tobacco: Never   Tobacco comments:    x 88 yo per the dtr  Substance and Sexual Activity   Alcohol use: Not Currently    Alcohol/week: 1.0 standard drink of alcohol    Types: 1 Glasses of wine per week    Comment: glass of  wine at 5   Drug use: No   Sexual activity: Not Currently

## 2021-10-14 ENCOUNTER — Ambulatory Visit (INDEPENDENT_AMBULATORY_CARE_PROVIDER_SITE_OTHER): Payer: Medicare Other | Admitting: Family Medicine

## 2021-10-14 ENCOUNTER — Encounter: Payer: Self-pay | Admitting: Family Medicine

## 2021-10-14 VITALS — BP 128/60 | HR 85 | Temp 98.7°F | Ht 72.0 in | Wt 183.0 lb

## 2021-10-14 DIAGNOSIS — N184 Chronic kidney disease, stage 4 (severe): Secondary | ICD-10-CM

## 2021-10-14 DIAGNOSIS — I5032 Chronic diastolic (congestive) heart failure: Secondary | ICD-10-CM

## 2021-10-14 DIAGNOSIS — E785 Hyperlipidemia, unspecified: Secondary | ICD-10-CM

## 2021-10-14 DIAGNOSIS — L989 Disorder of the skin and subcutaneous tissue, unspecified: Secondary | ICD-10-CM

## 2021-10-14 DIAGNOSIS — I482 Chronic atrial fibrillation, unspecified: Secondary | ICD-10-CM | POA: Diagnosis not present

## 2021-10-14 DIAGNOSIS — R413 Other amnesia: Secondary | ICD-10-CM | POA: Diagnosis not present

## 2021-10-14 DIAGNOSIS — I10 Essential (primary) hypertension: Secondary | ICD-10-CM

## 2021-10-14 DIAGNOSIS — I2510 Atherosclerotic heart disease of native coronary artery without angina pectoris: Secondary | ICD-10-CM

## 2021-10-14 NOTE — Patient Instructions (Addendum)
Flu shot- we should have these available within a month or two but please let us know if you get at outside pharmacy - new covid shot in October available likely  See if you can get one of the final visits with Dr. Denna Haggard but if not---We will call you within two weeks about your referral to [facility] dermatology. If you do not hear within 2 weeks, give [place] a call.   Hold off on labs since recently had with [place] kidney  Recommended follow up: Return in about 6 months (around 04/16/2022) for physical or sooner if needed.Schedule b4 you leave.

## 2021-10-14 NOTE — Progress Notes (Signed)
Phone [phone] In person visit   Subjective:   [name] is a 88 y.o. year old very pleasant male patient who presents for/with See problem oriented charting Chief Complaint  Patient presents with   Follow-up   Hypertension   Past Medical History-  Patient Active Problem List   Diagnosis Date Noted   Heart failure with preserved ejection fraction ([place]) 06/14/2021    Priority: High   CKD (chronic kidney disease), stage IV ([place]) 11/22/2015    Priority: High   Memory loss 07/21/2014    Priority: High   Chronic atrial fibrillation ([place]) 07/08/2011    Priority: High   Hemiparesis affecting right side as late effect of cerebrovascular accident ([place]) 03/26/2007    Priority: High   CAD (coronary artery disease) 08/23/2006    Priority: High   Macular degeneration 01/21/2020    Priority: Medium    Venous insufficiency 10/02/2016    Priority: Medium    Right hydrocele 04/03/2016    Priority: Medium    Thrombocytopenia ([place]) 11/22/2015    Priority: Medium    Bradycardia 07/09/2011    Priority: Medium    Major depression in remission ([place]) 07/26/2007    Priority: Medium    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    Priority: Medium    Hyperlipidemia 08/23/2006    Priority: Medium    Essential hypertension 08/23/2006    Priority: Medium    Disorder of bone and cartilage 06/14/2021    Priority: Low   Disorder of gallbladder 06/14/2021    Priority: Low   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     Priority: Low   Left wrist pain 11/01/2016    Priority: Low   Sensorineural hearing loss (SNHL), bilateral 07/06/2015    Priority: Low   Actinic keratosis 07/30/2009    Priority: Low   GERD 08/23/2006    Priority: Low    Medications- reviewed and updated Current Outpatient Medications  Medication Sig Dispense Refill   amLODipine (NORVASC) 2.5 MG tablet TAKE 1 TABLET(2.5 MG) BY MOUTH DAILY 90 tablet 3   ELIQUIS 2.5 MG TABS tablet TAKE 1  TABLET(2.5 MG) BY MOUTH TWICE DAILY 60 tablet 11   escitalopram (LEXAPRO) 20 MG tablet TAKE 1 TABLET BY MOUTH DAILY 90 tablet 1   Ferrous Sulfate (IRON PO) Take 1 tablet by mouth daily.     furosemide (LASIX) 40 MG tablet Take 1 tablet (40 mg total) by mouth daily. 90 tablet 3   Lansoprazole (PREVACID PO) Take 15 mg by mouth daily.     memantine (NAMENDA) 10 MG tablet TAKE 1 TABLET BY MOUTH DAILY 90 tablet 3   Multiple Vitamin (MULTIVITAMIN) tablet Take 1 tablet by mouth daily.     rosuvastatin (CRESTOR) 20 MG tablet TAKE 1 TABLET BY MOUTH EVERY DAY 90 tablet 3   tamsulosin (FLOMAX) 0.4 MG CAPS capsule TAKE 1 CAPSULE BY MOUTH AT BEDTIME 90 capsule 3   triamcinolone cream (KENALOG) 0.1 % Apply 1 application topically 2 (two) times daily. For 7-10 days maximum 160 g 0   No current facility-administered medications for this visit.     Objective:  BP 128/60   Pulse 85   Temp 98.7 F (37.1 C)   Ht 6' (1.829 m)   Wt 183 lb (83 kg)   SpO2 96%   BMI 24.82 kg/m  Gen: NAD, resting comfortably CV: irregularly irregular no murmurs rubs or gallops Lungs: CTAB no crackles, wheeze, rhonchi Abdomen: soft/nontender/nondistended/normal bowel sounds.  No rebound or guarding.  Ext: 1+ edema on left, 1-2+ on left with more significant venous stasis changes with some Skin: warm, dry Neuro: garbled speech, walks with walker    Assessment and Plan   #social update- mom was in hospital and then SNF and now back home with around the clock care - some concern about not always taking meds- going to try pill packs through [facility]  #Distal fibular fracture-noted on September 17, 2021 in emergency room-placed in cam walker and plans for follow-up with Dr. [name] who he saw on August 2-noted patient was having difficulty with cam walker-plan was for 4-week follow-up- was given a brace but not consistently wearing  #Memory loss/garbled speech after prior stroke S: Medication: Namenda 10 mg daily-has been  started by Dr. Arnoldo Morale -still gets all tax papers together for instance A/P: overall stable- continue to monitor   #CAD #hyperlipidemia S: Medication:Rosuvastatin 20 mg, Eliquis 2.5 mg twice daily-therefore not on aspirin - no chest pain or shortness of breath clearly reported or signed/indicated by patient Lab Results  Component Value Date   CHOL 131 01/27/2021   HDL 65.60 01/27/2021   LDLCALC 52 01/27/2021   LDLDIRECT 66.0 06/01/2016   TRIG 69.0 01/27/2021   CHOLHDL 2 01/27/2021   A/P: CAD appears asymptomatic- continue current meds. Lipids controlled- continue current meds with LDL under 70   # Atrial fibrillation- follows with a fib clinic S: Rate controlled with no rx Anticoagulated with  eliquis 2.5 mg BID A/P: Appropriately anticoagulated on eliquis and rate controlled even without meds-continue current medication  #hypertension #CHF with preserved ejection fraction #CKD IV- Dr. Carolin Sicks follows- would not want dialysis S: medication: amlodipine 2.5 mg, lasix 40 mg - weight within 1 lb of last visit, swelling maybe slightly worse but not using compression - daughter will encourage him but has been challenging, no shortness of breath reported BP Readings from Last 3 Encounters:  10/14/21 128/60  09/17/21 (!) 187/65- but in emergency room/in pain  09/07/21 (!) 138/52  A/P: blood pressure-well-controlled-continue current medications.  CHF appears euvolemic-continue current medications (slight increased edema but may be due to not wearing compression stockings)  For chronic kidney disease stage IV recently saw Dr. Carolin Sicks- stable overall  # Depression S: Medication:Escitalopram 20 mg A/P: mood has been stable/improved since taking this- continue current meds  #GERD-typically controlled with Prevacid over-the-counter  #venous insufficiency- worse on right- not wearing his compression- enocuraged to start- venous duplex negative in 2018 and 2019 and largely  unchanged -hasnt had to see wound care recently  #Just had labs with Dr. Carolin Sicks- anticipzate had CBC and Renal function panel- have results but not notes- family was told stbal-e we will hold off on labs for now- hopefully we will receive soon  #right facial lesion- possible squamous cell carcinoma- refer to dermatology if cant get in for one of last visits with [facility]  Recommended follow up: Return in about 6 months (around 04/16/2022) for physical or sooner if needed.Schedule b4 you leave. Future Appointments  Date Time Provider [place]  11/02/2021  1:15 PM Pete Pelt, PA-C [facility] None    Lab/Order associations:   ICD-10-CM   1. Skin lesion  L98.9 Ambulatory referral to Dermatology    2. Memory loss  R41.3     3. Chronic heart failure with preserved ejection fraction (HCC)  I50.32     4. Chronic atrial fibrillation (HCC)  I48.20     5. Coronary artery disease involving native coronary  artery of native heart without angina pectoris  I25.10     6. Essential hypertension  I10     7. Hyperlipidemia, unspecified hyperlipidemia type  E78.5     8. CKD (chronic kidney disease), stage IV ([place])  N18.4       No orders of the defined types were placed in this encounter.   Return precautions advised.  [name], MD

## 2021-11-02 ENCOUNTER — Ambulatory Visit: Payer: Medicare Other | Admitting: Physician Assistant

## 2021-11-03 ENCOUNTER — Ambulatory Visit: Payer: Medicare Other | Admitting: Physician Assistant

## 2021-11-10 ENCOUNTER — Ambulatory Visit: Payer: Medicare Other | Admitting: Family

## 2021-11-10 ENCOUNTER — Encounter: Payer: Self-pay | Admitting: Family

## 2021-11-10 VITALS — BP 174/62 | HR 41 | Temp 98.0°F | Ht 72.0 in | Wt 180.2 lb

## 2021-11-10 DIAGNOSIS — L03115 Cellulitis of right lower limb: Secondary | ICD-10-CM

## 2021-11-10 MED ORDER — CEPHALEXIN 500 MG PO CAPS: 500.0000 mg | ORAL_CAPSULE | Freq: Two times a day (BID) | ORAL | 0 refills | Status: AC

## 2021-11-10 NOTE — Progress Notes (Signed)
[name], male    DOB: [birth year], 88 y.o.   MRN: [id number]  Chief Complaint  Patient presents with   Leg Swelling    right lower leg    HPI:      Right leg swelling and redness:  Pt c/o swelling, redness,  & warm to the touch with sores on right lower leg. Has not applied any medicine to the sores. Pt son-in-law with pt states his right leg is always more swollen d/t hx of vein graft & he had fx in same leg 2 months ago and only wore a boot for short time, then refused to continue to wear. Pt also refuses to wear compression sock on right leg.      Assessment & Plan:  1. Cellulitis of right leg - w/swelling, erythema, clotted sores from weeping. Sending keflex, pt has had before and tolerated, advised pt and son-in-law on use & SE, clean leg daily with soap & water, must elevate leg whenever resting. Refuses to wear compression.  - cephALEXin (KEFLEX) 500 MG capsule; Take 1 capsule (500 mg total) by mouth 2 (two) times daily for 7 days. Take for 7 days  Dispense: 14 capsule; Refill: 0    Subjective:    Outpatient Medications Prior to Visit  Medication Sig Dispense Refill   amLODipine (NORVASC) 2.5 MG tablet TAKE 1 TABLET(2.5 MG) BY MOUTH DAILY 90 tablet 3   ELIQUIS 2.5 MG TABS tablet TAKE 1 TABLET(2.5 MG) BY MOUTH TWICE DAILY 60 tablet 11   escitalopram (LEXAPRO) 20 MG tablet TAKE 1 TABLET BY MOUTH DAILY 90 tablet 1   Ferrous Sulfate (IRON PO) Take 1 tablet by mouth daily.     furosemide (LASIX) 40 MG tablet Take 1 tablet (40 mg total) by mouth daily. 90 tablet 3   Lansoprazole (PREVACID PO) Take 15 mg by mouth daily.     memantine (NAMENDA) 10 MG tablet TAKE 1 TABLET BY MOUTH DAILY 90 tablet 3   Multiple Vitamin (MULTIVITAMIN) tablet Take 1 tablet by mouth daily.     rosuvastatin (CRESTOR) 20 MG tablet TAKE 1 TABLET BY MOUTH EVERY DAY 90 tablet 3   tamsulosin (FLOMAX) 0.4 MG CAPS capsule TAKE 1 CAPSULE BY MOUTH AT BEDTIME 90 capsule 3   triamcinolone cream  (KENALOG) 0.1 % Apply 1 application topically 2 (two) times daily. For 7-10 days maximum 160 g 0   No facility-administered medications prior to visit.   Past Medical History:  Diagnosis Date   Actinic keratosis    BPH with urinary obstruction    Bradycardia    CAD (coronary artery disease)    CABG x5   Cerebrovascular accident (stroke) ([place])    With right hemiparesis and persistent expressive aphasia   GERD (gastroesophageal reflux disease)    History of hydronephrosis    Hx of hydronephrosis 07/08/2011   right    Hyperlipidemia    Hypertension    Iron deficiency anemia due to chronic blood loss 03/26/2007   Now resolved     Iron deficiency anemia secondary to blood loss (chronic)    Persistent atrial fibrillation ([place])    Pulmonary hypertension ([place])    by echo 2013   Rosacea    Situational depression    Severe   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  Constitutional:      General: He is not in acute distress.    Appearance: Normal appearance.  HENT:     Head: Normocephalic.  Cardiovascular:     Rate and Rhythm: Normal rate and regular rhythm.  Pulmonary:     Effort: Pulmonary effort is normal.     Breath sounds: Normal breath sounds.  Musculoskeletal:        General: Normal range of motion.     Cervical back: Normal range of motion.      Right lower leg: 2+ Edema (w/erythema) present.     Left lower leg: No edema.  Skin:    General: Skin is warm and dry.     Findings: Lesion (several dark red, clotted lesions & scabs along anterior lower right leg with erythema, mild warmth & swelling) present.  Neurological:     Mental Status: He is alert and oriented to person, place, and time.  Psychiatric:        Mood and Affect: Mood normal.    BP (!) 174/62 (BP Location: Left Arm, Patient Position: Sitting, Cuff Size: Large)   Pulse (!) 41   Temp 98 F (36.7 C) (Temporal)   Ht 6' (1.829 m)   Wt 180 lb 4 oz (81.8 kg)   SpO2 99%   BMI 24.45 kg/m  Wt Readings from Last 3 Encounters:  11/10/21 180 lb 4 oz (81.8 kg)  10/14/21 183 lb (83 kg)  09/07/21 179 lb (81.2 kg)    Jeanie Sewer, NP

## 2021-11-10 NOTE — Patient Instructions (Addendum)
It was very nice to see you today!   I have sent over an antibiotic to treat your right leg cellulitis.  Wash the leg daily, gently, with soap and water, dry well.  Moisturize the leg daily with a thick cream, such as Eucerin, Keri, Cetaphil. Dry, flaky skin will make it more susceptible to cracking & infection. Keep leg elevated, even with heart or higher whenever sitting, resting to keep swelling down. Recommend wearing a mild (8-55mHg) compression sock during the day, take off at night.   Be sure to drink at least 2 liters = 8 cups of water every day.     PLEASE NOTE:  If you had any lab tests please let [facility] if you have not heard back within a few days. You may see your results on MyChart before we have a chance to review them but we will give you a call once they are reviewed by [facility] If we ordered any referrals today, please let [facility] if you have not heard from their office within the next week.

## 2021-11-21 ENCOUNTER — Encounter: Payer: Self-pay | Admitting: Physician Assistant

## 2021-11-21 ENCOUNTER — Ambulatory Visit: Payer: Medicare Other | Admitting: Physician Assistant

## 2021-11-21 ENCOUNTER — Ambulatory Visit (INDEPENDENT_AMBULATORY_CARE_PROVIDER_SITE_OTHER): Payer: Medicare Other

## 2021-11-21 DIAGNOSIS — S82831D Other fracture of upper and lower end of right fibula, subsequent encounter for closed fracture with routine healing: Secondary | ICD-10-CM

## 2021-11-21 NOTE — Progress Notes (Signed)
HPI: [name] returns today for follow-up of his right ankle lateral malleolus fracture.  He comes in today mostly wearing his brace.  His daughter presents with him today and states that the he is wearing the brace.  He also has been seen elsewhere for his lower leg edema and has been told to wear compression stockings which he is not wearing.  Physical exam: General: No acute distress.  Ambulates with a rollabout. Right ankle: No tenderness over the distal fibula.  Venous changes throughout the right leg.  Grade 1 ulceration at least 3 places right lower leg.  Radiographs: Right ankle 3 views: Distal fibula fracture unchanged in overall position alignment.  Significant consolidation.  Fracture site still slightly visible.  Talus well located within the ankle mortise no diastases.  Impression: Right distal fibula fracture  Plan: Discussed with patient and his daughter the need for compression hose patient is reluctant to wear.  Regards to the ankle we could rex-ray in a month however his daughter defers since he is still not wearing ASO brace.  He will return if he has any increased pain or deformity of the ankle.  Questions were encouraged and answered.

## 2021-11-28 ENCOUNTER — Telehealth: Payer: Medicare Other | Admitting: Physician Assistant

## 2021-11-28 ENCOUNTER — Telehealth: Payer: Self-pay | Admitting: Family Medicine

## 2021-11-28 DIAGNOSIS — U071 COVID-19: Secondary | ICD-10-CM

## 2021-11-28 DIAGNOSIS — R112 Nausea with vomiting, unspecified: Secondary | ICD-10-CM

## 2021-11-28 DIAGNOSIS — R531 Weakness: Secondary | ICD-10-CM

## 2021-11-28 NOTE — Patient Instructions (Signed)
  [name], thank you for joining [name], PA-C for today's virtual visit.  While this provider is not your primary care provider (PCP), if your PCP is located in our provider database this encounter information will be shared with them immediately following your visit.  Consent: (Patient) [name] provided verbal consent for this virtual visit at the beginning of the encounter.  Current Medications:  Current Outpatient Medications:    amLODipine (NORVASC) 2.5 MG tablet, TAKE 1 TABLET(2.5 MG) BY MOUTH DAILY, Disp: 90 tablet, Rfl: 3   ELIQUIS 2.5 MG TABS tablet, TAKE 1 TABLET(2.5 MG) BY MOUTH TWICE DAILY, Disp: 60 tablet, Rfl: 11   escitalopram (LEXAPRO) 20 MG tablet, TAKE 1 TABLET BY MOUTH DAILY, Disp: 90 tablet, Rfl: 1   Ferrous Sulfate (IRON PO), Take 1 tablet by mouth daily., Disp: , Rfl:    furosemide (LASIX) 40 MG tablet, Take 1 tablet (40 mg total) by mouth daily., Disp: 90 tablet, Rfl: 3   Lansoprazole (PREVACID PO), Take 15 mg by mouth daily., Disp: , Rfl:    memantine (NAMENDA) 10 MG tablet, TAKE 1 TABLET BY MOUTH DAILY, Disp: 90 tablet, Rfl: 3   Multiple Vitamin (MULTIVITAMIN) tablet, Take 1 tablet by mouth daily., Disp: , Rfl:    rosuvastatin (CRESTOR) 20 MG tablet, TAKE 1 TABLET BY MOUTH EVERY DAY, Disp: 90 tablet, Rfl: 3   tamsulosin (FLOMAX) 0.4 MG CAPS capsule, TAKE 1 CAPSULE BY MOUTH AT BEDTIME, Disp: 90 capsule, Rfl: 3   triamcinolone cream (KENALOG) 0.1 %, Apply 1 application topically 2 (two) times daily. For 7-10 days maximum, Disp: 160 g, Rfl: 0   Medications ordered in this encounter:  No orders of the defined types were placed in this encounter.    *If you need refills on other medications prior to your next appointment, please contact your pharmacy*  Follow-Up: Call back or seek an in-person evaluation if the symptoms worsen or if the condition fails to improve as anticipated.  [address] [phone]  Other  Instructions Please take Mr. Davie directly to the emergency department.    If you have been instructed to have an in-person evaluation today at a local Urgent Care facility, please use the link below. It will take you to a list of all of our available [facility], including address, phone number and hours of operation. Please do not delay care.  [facility]  If you or a family member do not have a primary care provider, use the link below to schedule a visit and establish care. When you choose a [facility] primary care physician or advanced practice provider, you gain a long-term partner in health. Find a Primary Care Provider  Learn more about [facility]'s in-office and virtual care options: [facility]

## 2021-11-28 NOTE — Telephone Encounter (Signed)
FYI

## 2021-11-28 NOTE — Telephone Encounter (Signed)
Can daughter help set him up for virtual?

## 2021-11-28 NOTE — Progress Notes (Signed)
[name] (POA), your father Is scheduled for a virtual visit with your provider today.    Just as we do with appointments in the office, we must obtain your consent to participate.  Your consent will be active for this visit and any virtual visit you may have with one of our providers in the next 365 days.    If you have a MyChart account, I can also send a copy of this consent to you electronically.  All virtual visits are billed to your insurance company just like a traditional visit in the office.  As this is a virtual visit, video technology does not allow for your provider to perform a traditional examination.  This may limit your provider's ability to fully assess your condition.  If your provider identifies any concerns that need to be evaluated in person or the need to arrange testing such as labs, EKG, etc, we will make arrangements to do so.    Although advances in technology are sophisticated, we cannot ensure that it will always work on either your end or our end.  If the connection with a video visit is poor, we may have to switch to a telephone visit.  With either a video or telephone visit, we are not always able to ensure that we have a secure connection.   I need to obtain your verbal consent now.   Are you willing to proceed with the  visit today?   [name], POA, has provided verbal consent on 11/28/2021 for a virtual visit (video or telephone).   [name], [place] 11/28/2021  7:43 PM   Date:  11/28/2021   ID:  [name], DOB December 11, 1928, MRN [id number]  Patient Location: Home Provider Location: Home Office   Participants: Patient and Provider for Visit and Wrap up  Method of visit: Video  Location of Patient: Home Location of Provider: Home Office Consent was obtain for visit over the video. Services rendered by provider: Visit was performed via video  A video enabled telemedicine application was used and I verified that I am speaking with the correct  person using two identifiers.  PCP:  [name], MD   Chief Complaint:  covid  History of Present Illness:    [name] is a 88 y.o. male with history as stated below. Presents video telehealth for an acute care visit  Pts daughter provided history. She states he has had a fever 101.26F, fatigue, nv. States he has been laying in bed all day and has had been eating or drinking. She states he is generally weak.  Pt is aphasic secondary to stroke. She is not able to check his oxygen saturation.  She states that she called his pcp and they advised her to take him to the ER.  Past Medical, Surgical, Social History, Allergies, and Medications have been Reviewed.  Past Medical History:  Diagnosis Date   Actinic keratosis    BPH with urinary obstruction    Bradycardia    CAD (coronary artery disease)    CABG x5   Cerebrovascular accident (stroke) ([place])    With right hemiparesis and persistent expressive aphasia   GERD (gastroesophageal reflux disease)    History of hydronephrosis    Hx of hydronephrosis 07/08/2011   right    Hyperlipidemia    Hypertension    Iron deficiency anemia due to chronic blood loss 03/26/2007   Now resolved     Iron deficiency anemia secondary to blood loss (chronic)  Persistent atrial fibrillation ([place])    Pulmonary hypertension ([place])    by echo 2013   Rosacea    Situational depression    Severe   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     Allergies:   Ciprofloxacin hcl and Sulfamethoxazole-trimethoprim   ROS See HPI for history of present illness.  Physical Exam Constitutional:      Comments: Laying in bed  Neurological:     Mental  Status: He is alert.        MDM: pt with covid. Has generalized weakness, fevers, nv. In setting of multiple comorbidities, feel he needs to be seen in the ER. Likely needs labs, antiemetics, fluids. She agrees to take pt to the ER        There are no diagnoses linked to this encounter.   Time:   Today, I have spent 13 minutes with the patient with telehealth technology discussing the above problems, reviewing the chart, previous notes, medications and orders.    Tests Ordered: No orders of the defined types were placed in this encounter.   Medication Changes: No orders of the defined types were placed in this encounter.    Disposition:  Follow up  Signed, [name] Booze, PA-C  11/28/2021 7:43 PM

## 2021-11-28 NOTE — Telephone Encounter (Signed)
Caller states: - Patient tested positive for COVID today and had a fever as well as threw up  - She was informed by her mother's hospice nurse to inform pt of this   Caller requests: -Patient be given a medication that could help with COVID   I offered caller to conduct a virtual visit with patient but patient didn't have mychart. After set up, mychart was still inaccessible for completing a vv.   Per policy I suggested pt be taken to UC or ED for evaluation but caller was reluctant due to mother just getting out of hospital.   Please advise.

## 2021-11-29 ENCOUNTER — Encounter: Payer: Self-pay | Admitting: Family

## 2021-11-29 ENCOUNTER — Telehealth (INDEPENDENT_AMBULATORY_CARE_PROVIDER_SITE_OTHER): Payer: Medicare Other | Admitting: Family

## 2021-11-29 DIAGNOSIS — U071 COVID-19: Secondary | ICD-10-CM | POA: Diagnosis not present

## 2021-11-29 MED ORDER — MOLNUPIRAVIR EUA 200MG CAPSULE: 4.0000 | ORAL_CAPSULE | Freq: Two times a day (BID) | ORAL | 0 refills | Status: AC

## 2021-11-29 NOTE — Telephone Encounter (Signed)
Called patient's daughter, Dolores Lory, to see if virtual visit could be attempted again   Caller states: - She was able to get virtual visit with urgent care working.  - Provider stated due to patient's age and medical issues, it would be better for him to go to the hospital  - Caller called EMS who informed her that patient's vitals were improving and struggled breath was not present  - She is still interested in patient getting medication if needed   I was able to schedule patient a vv with Jeanie Sewer 10/03 @ 10:40am.

## 2021-11-29 NOTE — Progress Notes (Signed)
MyChart Video Visit    Virtual Visit via Video Note   This format is felt to be most appropriate for this patient at this time. Physical exam was limited by quality of the video and audio technology used for the visit. CMA was able to get the patient set up on a video visit.  Patient location: Home. Patient and provider in visit Provider location: Office  I discussed the limitations of evaluation and management by telemedicine and the availability of in person appointments. The patient expressed understanding and agreed to proceed.  Visit Date: 11/29/2021  Today's healthcare provider: Jeanie Sewer, NP     Subjective:   [name], male    DOB: [birth year], 88 y.o.   MRN: [id number]  Chief Complaint  Patient presents with   Covid Positive    Tested positive for Covid on 11/28/21 Denies any SOB, Diarrhea Sx started yesterday   Fatigue   Fever    101.8 yesterday Had tylenol yesterday   Cough   Emesis    Started yesterday   *Patient is accompanied by their daughter today, who is helping to provide HPI/medical information as pt is non-verbal d/t hx of CVA.  HPI Upper Respiratory Infection: Symptoms include fever 101, nausea with vomiting, and fatigue and cough .  Onset of symptoms was 1 day ago, gradually worsening since that time. He is drinking moderate amounts of fluids. Evaluation to date: pt had a virtual visit last evening and advised to seek the ER, pt dtr said they called EMS who came out and evaluated pt and said his VS were all stable, O2 sat >90%, no SOB or increased respiratory effort and due to long wait times in ER he would be fine to stay home and to call PCP today. Treatment to date:  Tylenol . Pt is up and dressed, sleeping in recliner.  Assessment & Plan:   Problem List Items Addressed This Visit   None Visit Diagnoses     COVID-19    -  Primary Sending Molnupiravir, pt advised of FDA label for emergency use, how to take, & SE.  Advised of CDC guidelines for isolation & masking if out in public. Continue to provide Tylenol up to '650mg'$  q4h while awake to control fever for next 24-48h.  Encouraged to monitor & notify office of any worsening symptoms: increased shortness of breath, weakness, and signs of dehydration. Encouraged pt dtr to push fluids, monitor O2 sats with home oximeter and notify office/seek ER if dropping below 90%.     Relevant Medications   molnupiravir EUA (LAGEVRIO) 200 mg CAPS capsule      Past Medical History:  Diagnosis Date   Actinic keratosis    BPH with urinary obstruction    Bradycardia    CAD (coronary artery disease)    CABG x5   Cerebrovascular accident (stroke) ([place])    With right hemiparesis and persistent expressive aphasia   GERD (gastroesophageal reflux disease)    History of hydronephrosis    Hx of hydronephrosis 07/08/2011   right    Hyperlipidemia    Hypertension    Iron deficiency anemia due to chronic blood loss 03/26/2007   Now resolved     Iron deficiency anemia secondary to blood loss (chronic)    Persistent atrial fibrillation ([place])    Pulmonary hypertension ([place])    by echo 2013   Rosacea    Situational depression    Severe   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 08/16/2016   treated after biopsy   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   [facility] - treated after biopsy   Tricuspid regurgitation     Past Surgical History:  Procedure Laterality Date   ABDOMINAL SURGERY     BACK SURGERY     BAND HEMORRHOIDECTOMY     CHOLECYSTECTOMY     CORONARY ARTERY BYPASS GRAFT     x 5   dental implants     HERNIA REPAIR     KIDNEY SURGERY     stenosis with stent   TONSILLECTOMY      Outpatient Medications Prior to Visit  Medication Sig Dispense Refill   amLODipine (NORVASC) 2.5 MG tablet TAKE 1 TABLET(2.5 MG) BY MOUTH DAILY 90 tablet 3   ELIQUIS 2.5 MG TABS tablet TAKE 1 TABLET(2.5 MG) BY MOUTH TWICE DAILY 60 tablet 11    escitalopram (LEXAPRO) 20 MG tablet TAKE 1 TABLET BY MOUTH DAILY 90 tablet 1   Ferrous Sulfate (IRON PO) Take 1 tablet by mouth daily.     furosemide (LASIX) 40 MG tablet Take 1 tablet (40 mg total) by mouth daily. 90 tablet 3   Lansoprazole (PREVACID PO) Take 15 mg by mouth daily.     memantine (NAMENDA) 10 MG tablet TAKE 1 TABLET BY MOUTH DAILY 90 tablet 3   Multiple Vitamin (MULTIVITAMIN) tablet Take 1 tablet by mouth daily.     rosuvastatin (CRESTOR) 20 MG tablet TAKE 1 TABLET BY MOUTH EVERY DAY 90 tablet 3   tamsulosin (FLOMAX) 0.4 MG CAPS capsule TAKE 1 CAPSULE BY MOUTH AT BEDTIME 90 capsule 3   triamcinolone cream (KENALOG) 0.1 % Apply 1 application topically 2 (two) times daily. For 7-10 days maximum 160 g 0   No facility-administered medications prior to visit.    Allergies  Allergen Reactions   Ciprofloxacin Hcl     unknown   Sulfamethoxazole-Trimethoprim     unknown       Objective:   Physical Exam Vitals and nursing note reviewed.  Constitutional:      General: She is not in acute distress.    Appearance: Normal appearance.  HENT:     Head: Normocephalic.  Pulmonary:     Effort: No respiratory distress.  Musculoskeletal:     Cervical back: Normal range of motion.  Skin:    General: Skin is dry.     Coloration: Skin is not pale.  Neurological:     Mental Status: She is alert and oriented to person, place, and time.  Psychiatric:        Mood and Affect: Mood normal.   There were no vitals taken for this visit.  Wt Readings from Last 3 Encounters:  11/10/21 180 lb 4 oz (81.8 kg)  10/14/21 183 lb (83 kg)  09/07/21 179 lb (81.2 kg)    I discussed the assessment and treatment plan with the patient. The patient was provided an opportunity to ask questions and all were answered. The patient agreed with the plan and demonstrated an understanding of the instructions.   The patient was advised to call back or seek an in-person evaluation if the symptoms worsen or  if the condition fails to improve as anticipated.  Jeanie Sewer, NP [place] [phone] (phone) [phone] (fax)  [place]

## 2021-11-29 NOTE — Telephone Encounter (Signed)
FYI

## 2021-11-29 NOTE — Telephone Encounter (Signed)
See below

## 2021-11-29 NOTE — Telephone Encounter (Signed)
Glad he was able to have visit

## 2021-12-08 DIAGNOSIS — C44329 Squamous cell carcinoma of skin of other parts of face: Secondary | ICD-10-CM | POA: Diagnosis not present

## 2021-12-20 ENCOUNTER — Ambulatory Visit: Payer: Medicare Other | Attending: Cardiovascular Disease | Admitting: Cardiovascular Disease

## 2021-12-20 ENCOUNTER — Ambulatory Visit: Payer: Medicare Other | Admitting: Family Medicine

## 2021-12-20 ENCOUNTER — Other Ambulatory Visit: Payer: Self-pay | Admitting: Family Medicine

## 2021-12-20 ENCOUNTER — Encounter: Payer: Self-pay | Admitting: Cardiovascular Disease

## 2021-12-20 VITALS — BP 138/68 | HR 45 | Ht 72.0 in | Wt 182.2 lb

## 2021-12-20 DIAGNOSIS — Z01810 Encounter for preprocedural cardiovascular examination: Secondary | ICD-10-CM | POA: Diagnosis not present

## 2021-12-20 DIAGNOSIS — I5032 Chronic diastolic (congestive) heart failure: Secondary | ICD-10-CM

## 2021-12-20 DIAGNOSIS — I2510 Atherosclerotic heart disease of native coronary artery without angina pectoris: Secondary | ICD-10-CM | POA: Diagnosis not present

## 2021-12-20 DIAGNOSIS — Z0000 Encounter for general adult medical examination without abnormal findings: Secondary | ICD-10-CM

## 2021-12-20 DIAGNOSIS — I4819 Other persistent atrial fibrillation: Secondary | ICD-10-CM | POA: Diagnosis not present

## 2021-12-20 NOTE — Patient Instructions (Signed)
Medication Instructions:  No changes *If you need a refill on your cardiac medications before your next appointment, please call your pharmacy*   Lab Work: none If you have labs (blood work) drawn today and your tests are completely normal, you will receive your results only by: MyChart Message (if you have MyChart) OR A paper copy in the mail If you have any lab test that is abnormal or we need to change your treatment, we will call you to review the results.   Testing/Procedures: none   Follow-Up: At [facility], you and your health needs are our priority.  As part of our continuing mission to provide you with exceptional heart care, we have created designated Provider Care Teams.  These Care Teams include your primary Cardiologist (physician) and Advanced Practice Providers (APPs -  Physician Assistants and Nurse Practitioners) who all work together to provide you with the care you need, when you need it.   Your next appointment:   12 month(s)  The format for your next appointment:   In Person  Provider:   Christopher McAlhany, MD     Other Instructions   Important Information About Sugar       

## 2021-12-20 NOTE — Progress Notes (Signed)
Chief Complaint  Patient presents with   Follow-up    CAD   History of Present Illness: 88 yo male with history of BPH, CAD s/p CABG remotely, prior CVA, GERD, hyperlipidemia, HTN, iron deficiency anemia, CKD stage IV, persistent atrial fibrillation, depression here today for cardiac follow up. I saw him as a new patient in March 2023. He has been followed by Dr. Rayann Heman. Long standing history of persistent atrial fibrillation and has been on Eliquis. He has bradycardia and is off of all AV nodal blocking drugs. He is aphasic from a prior stroke. He is known to have CAD with 5V CABG in the mid 46s. He has chronic lower extremity edema felt to be due to venous insufficiency. This had worsened in March 2023 and when Dr. Yong Channel saw him his BNP was elevated. His family found out that he had not been taking his Lasix for two months. His Lasix was restarted and his LE edema resolved. He had no complaints when I saw him in March 2023.   He is here today for follow up. The patient denies any chest pain, dyspnea, palpitations, lower extremity edema, orthopnea, PND, dizziness, near syncope or syncope.   Primary Care Physician: [name], MD  Past Medical History:  Diagnosis Date   Actinic keratosis    BPH with urinary obstruction    Bradycardia    CAD (coronary artery disease)    CABG x5   Cerebrovascular accident [facility]stroke) [facility])    With right hemiparesis and persistent expressive aphasia   GERD (gastroesophageal reflux disease)    History of hydronephrosis    Hx of hydronephrosis 07/08/2011   right    Hyperlipidemia    Hypertension    Iron deficiency anemia due to chronic blood loss 03/26/2007   Now resolved     Iron deficiency anemia secondary to blood loss (chronic)    Persistent atrial fibrillation ([place])    Pulmonary hypertension ([place])    by echo 2013   Rosacea    Situational depression    Severe   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   treated  after biopsy   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   [facility] - treated after biopsy   Tricuspid regurgitation     Past Surgical History:  Procedure Laterality Date   ABDOMINAL SURGERY     BACK SURGERY     BAND HEMORRHOIDECTOMY     CHOLECYSTECTOMY     CORONARY ARTERY BYPASS GRAFT     x 5   dental implants     HERNIA REPAIR     KIDNEY SURGERY     stenosis with stent   TONSILLECTOMY      Current Outpatient Medications  Medication Sig Dispense Refill   amLODipine (NORVASC) 2.5 MG tablet TAKE 1 TABLET(2.5 MG) BY MOUTH DAILY 90 tablet 3   ELIQUIS 2.5 MG TABS tablet TAKE 1 TABLET(2.5 MG) BY MOUTH TWICE DAILY 60 tablet 11   escitalopram (LEXAPRO) 20 MG tablet TAKE 1 TABLET BY MOUTH DAILY 90 tablet 1   Ferrous Sulfate (IRON PO) Take 1 tablet by mouth daily.     furosemide (LASIX) 40 MG tablet Take 1 tablet (40 mg total) by mouth daily. 90 tablet 3   Lansoprazole (PREVACID PO) Take 15 mg by mouth daily.     memantine (NAMENDA) 10 MG tablet TAKE 1 TABLET BY MOUTH DAILY 90 tablet 3   Multiple Vitamin (MULTIVITAMIN) tablet Take 1 tablet  by mouth daily.     rosuvastatin (CRESTOR) 20 MG tablet TAKE 1 TABLET BY MOUTH EVERY DAY 90 tablet 3   tamsulosin (FLOMAX) 0.4 MG CAPS capsule TAKE 1 CAPSULE BY MOUTH AT BEDTIME 90 capsule 3   triamcinolone cream (KENALOG) 0.1 % Apply 1 application topically 2 (two) times daily. For 7-10 days maximum 160 g 0   No current facility-administered medications for this visit.    Allergies  Allergen Reactions   Ciprofloxacin Hcl     unknown   Sulfamethoxazole-Trimethoprim     unknown    Social History   Socioeconomic History   Marital status: Married    Spouse name: Not on file   Number of children: Not on file   Years of education: Not on file   Highest education level: Not on file  Occupational History   Not on file  Tobacco Use   Smoking status: Former   Smokeless tobacco: Never   Tobacco comments:    x 88 yo per the dtr   Substance and Sexual Activity   Alcohol use: Not Currently    Alcohol/week: 1.0 standard drink of alcohol    Types: 1 Glasses of wine per week    Comment: glass of wine at 5   Drug use: No   Sexual activity: Not Currently  Other Topics Concern   Not on file  Social History Narrative   Lives in [place] with spouse. LIves at a house at a retirement home. 3 children. 6 grandkids. 5 greatgrandkids.       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   BP 138/68   Pulse (!) 45   Ht 6' (1.829 m)   Wt 182 lb 3.2 oz (82.6 kg)   SpO2 99%   BMI 24.71 kg/m   Physical Examination: General: Well developed, well nourished, NAD  HEENT: OP clear, mucus membranes moist  SKIN: warm, dry. No rashes. Neuro: No focal deficits  Musculoskeletal: Muscle strength 5/5 all ext  Psychiatric: Mood and affect normal  Neck: No JVD, no carotid bruits, no thyromegaly, no lymphadenopathy.  Lungs:Clear bilaterally, no wheezes, rhonci, crackles Cardiovascular: Regular rate and rhythm. No murmurs, gallops or rubs. Abdomen:Soft. Bowel sounds present. Non-tender.  Extremities: No lower extremity edema. Pulses are 2 + in the bilateral DP/PT.  EKG:  EKG is not ordered today. The ekg ordered today demonstrates   Recent Labs: 05/10/2021: Hemoglobin 10.5; NT-Pro BNP 5,381; Platelets 165.0; TSH 3.53 06/14/2021: ALT 23; BUN 44; Creatinine, Ser 2.35; Potassium 4.3; Sodium 140    Lipid Panel    Component Value Date/Time   CHOL 131 01/27/2021 1419   TRIG 69.0 01/27/2021 1419   TRIG 41 12/29/2005 0930   HDL 65.60 01/27/2021 1419   CHOLHDL 2 01/27/2021 1419   VLDL 13.8 01/27/2021 1419  LDLCALC 52 01/27/2021 1419   LDLCALC 54 01/21/2020 1218   LDLDIRECT 66.0 06/01/2016 1136     Wt Readings from Last 3 Encounters:  12/20/21 182 lb 3.2 oz (82.6 kg)  11/10/21 180 lb 4 oz (81.8 kg)  10/14/21 183 lb (83 kg)     Assessment and Plan:   1. CAD s/p CABG without angina: No chest pain. Continue statin. He is not on an ASA since he is on Eliquis. No beta blocker secondary to long standing bradycardia.   2. Persistent atrial fib: Rate controlled atrial fib today on exam. Will continue Eliquis.    3. Chronic diastolic CHF:  Volume status is ok today. LE edema resolved. Continue Lasix 40 mg daily.    4. Pre-operative cardiovascular examination: He can proceed with his planned surgery without further cardiac workup. He has a skin cancer on his face.   Labs/ tests ordered today include:  No orders of the defined types were placed in this encounter.  Disposition:   F/U with me 6 months.   Signed, Lauree Chandler, MD 12/20/2021 10:24 AM    [facility] [place], [place], [place]  [address] Phone: [phone]; Fax: [phone]

## 2021-12-22 DIAGNOSIS — C44329 Squamous cell carcinoma of skin of other parts of face: Secondary | ICD-10-CM | POA: Diagnosis not present

## 2022-01-04 DIAGNOSIS — C44329 Squamous cell carcinoma of skin of other parts of face: Secondary | ICD-10-CM | POA: Diagnosis not present

## 2022-01-17 ENCOUNTER — Ambulatory Visit (HOSPITAL_COMMUNITY)
Admission: RE | Admit: 2022-01-17 | Discharge: 2022-01-17 | Disposition: A | Discharge status: Home / Self Care | Payer: Medicare Other | Source: Ambulatory Visit | Attending: Nurse Practitioner | Admitting: Nurse Practitioner

## 2022-01-17 VITALS — BP 186/74 | HR 59 | Ht 72.0 in | Wt 183.6 lb

## 2022-01-17 DIAGNOSIS — I4821 Permanent atrial fibrillation: Secondary | ICD-10-CM

## 2022-01-17 DIAGNOSIS — D6869 Other thrombophilia: Secondary | ICD-10-CM

## 2022-01-17 DIAGNOSIS — Z7901 Long term (current) use of anticoagulants: Secondary | ICD-10-CM | POA: Insufficient documentation

## 2022-01-17 DIAGNOSIS — I4811 Longstanding persistent atrial fibrillation: Secondary | ICD-10-CM | POA: Diagnosis not present

## 2022-01-17 DIAGNOSIS — I129 Hypertensive chronic kidney disease with stage 1 through stage 4 chronic kidney disease, or unspecified chronic kidney disease: Secondary | ICD-10-CM | POA: Insufficient documentation

## 2022-01-17 DIAGNOSIS — I482 Chronic atrial fibrillation, unspecified: Secondary | ICD-10-CM | POA: Diagnosis not present

## 2022-01-17 DIAGNOSIS — I69320 Aphasia following cerebral infarction: Secondary | ICD-10-CM | POA: Diagnosis not present

## 2022-01-17 DIAGNOSIS — N189 Chronic kidney disease, unspecified: Secondary | ICD-10-CM | POA: Insufficient documentation

## 2022-01-17 DIAGNOSIS — R001 Bradycardia, unspecified: Secondary | ICD-10-CM | POA: Insufficient documentation

## 2022-01-17 NOTE — Progress Notes (Signed)
Patient ID: CALYB MCQUARRIE, male   DOB: 1928/11/19, 88 y.o.   MRN: [id number]       Primary Care Physician: [name], MD Primary Electrophysiologist: Dr. Rachelle Hora is a 88 y.o. male with a h/o long standing persistent atrial fibrillation who presents for f/u in the [facility]. The patient is aphasic due to previous stroke and is here with his daughter. He is off chronotropic drugs due to bradycardia. He  has a v rate of 48  bpm today. Daughter does not think he has been as active lately, but he does drive and goes to the grocery store. Does not report any lightheadedness of falls.  Taking eliquis without bleeding issues with a chadsvasc of at least 6. Continues on eliquis  to 2.5 mg bid. He has been seeing a kidney MD. Some BP meds have been adjusted by PCP lately.     He does live in  an assisted nursing facility with his wife and takes  his own meds.   F/u in afib clinic, 11/15. He remains stable with afib in the 50's. He walks  with a cane. Only one small fall at night getting out of the bed to urinate. No injuries. Has had chronic swelling of his rt lower extremity. Treated last June for cellulitis of this leg by PCP. Has had cellulitis of this leg before as well. Denies any lightheadedness or dizziness. Remains aphasic from a prior stroke. Here with daughter. Lives with wife.  F/u in afib clinic, 07/07/20. He remains  in permanent afib with CVR, no rate control drugs on board. He remains on eliquis 2.5 mg bid for CHA2DS2VASc score of 6. No bleeding issues. He has chronically swollen rt LE, Dr. Yong Channel feels prior CBAG surgery may be contributing. Several broken skin areas mon shin, does not look infected.   F/u in afib 04/20/21. He  is her with daughter. He finally had to give up driving for issues with his vision, He continues with aphasia. He stared having weeping of his rt leg with LLE last fall. He had his legs wrapped for awhile and now has a  nurse come to his home 2x a day to follow and to help put on/take off his compression sock. He remains in permanent afib with HR's in the low 50's. This is his usual and has not had any dizzy spells. One mechanical fall several months  ago.    F/u in afib clinic, 01/17/22. He remains in rate controlled afib. He is here with daughter and remains aphasic from prior stroke. He was having issues with LLE. It was found out that pt was not taking lasix for 2 months. Now back on lasix and LLE has resolved. Has CKD and his creatinine  is running over 2.0 now for many months. Is appropriately anticoagulated with eliquis 2.5 mg bid. No falls reported by daughter.today is his 72th wedding anniversary. His wife unfortunately is not doing well and is in hospice.   Today, he denies symptoms of palpitations, chest pain, shortness of breath, orthopnea, PND,  dizziness, presyncope, syncope, snoring, daytime somnolence, bleeding, or neurologic sequela..The patient is tolerating medications without difficulties and is otherwise without complaint today.    Past Medical History:  Diagnosis Date   Actinic keratosis    BPH with urinary obstruction    Bradycardia    CAD (coronary artery disease)    CABG x5   Cerebrovascular accident (stroke) ([place])  With right hemiparesis and persistent expressive aphasia   GERD (gastroesophageal reflux disease)    History of hydronephrosis    Hx of hydronephrosis 07/08/2011   right    Hyperlipidemia    Hypertension    Iron deficiency anemia due to chronic blood loss 03/26/2007   Now resolved     Iron deficiency anemia secondary to blood loss (chronic)    Persistent atrial fibrillation ([place])    Pulmonary hypertension ([place])    by echo 2013   Rosacea    Situational depression    Severe   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   treated after biopsy   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   [facility] - treated after biopsy   Tricuspid  regurgitation    Past Surgical History:  Procedure Laterality Date   ABDOMINAL SURGERY     BACK SURGERY     BAND HEMORRHOIDECTOMY     CHOLECYSTECTOMY     CORONARY ARTERY BYPASS GRAFT     x 5   dental implants     HERNIA REPAIR     KIDNEY SURGERY     stenosis with stent   TONSILLECTOMY        Allergies  Allergen Reactions   Ciprofloxacin Hcl     unknown   Sulfamethoxazole-Trimethoprim     unknown    Social History   Socioeconomic History   Marital status: Married    Spouse name: Not on file   Number of children: Not on file   Years of education: Not on file   Highest education level: Not on file  Occupational History   Not on file  Tobacco Use   Smoking status: Former   Smokeless tobacco: Never   Tobacco comments:    x 88 yo per the dtr  Substance and Sexual Activity   Alcohol use: Not Currently    Alcohol/week: 1.0 standard drink of alcohol    Types: 1 Glasses of wine per week    Comment: glass of wine at 5   Drug use: No   Sexual activity: Not Currently  Other Topics Concern   Not on file  Social History Narrative   Lives in [place] with spouse. LIves at a house at a retirement home. 3 children. 6 grandkids. 5 greatgrandkids.       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  ROS- All systems are reviewed and negative except as per the HPI above.  Physical Exam: GEN- The patient is  elderly  appearing, alert and oriented x 3 today, aphasic   Head- normocephalic, atraumatic Eyes-  Sclera clear, conjunctiva pink Ears- hearing intact Oropharynx- clear Neck- supple, no JVP Lymph- no cervical lymphadenopathy Lungs- Clear to ausculation bilaterally, normal work of breathing Heart-  Irregular rate and rhythm, no murmurs, rubs or gallops, PMI not laterally displaced GI- soft, NT, ND, + BS Extremities- no clubbing, cyanosis, or 1+edema on rt (prior cabg)   MS- no significant deformity or atrophy Skin- no rash or lesion Psych- euthymic mood, full affect Neuro- strength and sensation are intact   Vent. rate 59 BPM PR interval * ms QRS duration 100 ms QT/QTcB 470/465 ms P-R-T axes * -55 69 Atrial fibrillation with slow ventricular response Left axis deviation Minimal voltage criteria for LVH, may be normal variant ( Cornell product ) Septal infarct , age undetermined Abnormal ECG When compared with ECG of 20-Apr-2021 11:20, PREVIOUS ECG IS PRESENT No significant change since last tracing Confirmed by Jenkins [place] [phone]) on 01/17/2022 11:14:26 AM Holter monitor 02/2016-Study Highlights    11/2//20 monitor- Atrial Fibrillation occurred continuously (100% burden), ranging from 30- 93 bpm (avg of 53 bpm). 6 Pauses occurred, the longest lasting 3.3 secs ( at night)  (18 bpm). Isolated VEs were rare (<1.0%), VE Couplets were rare (<1.0%), and no VE Triplets were present. Ventricular Bigeminy was present. No indication for PPM   Assessment and Plan:  1. Atrial fibrillation The patient has asymptomatic longstanding persistent atrial fibrillation with slow ventricular response,  v rates in the  50's  Monitor placed in January 2020, did  not show indication for PPM.    2. Previous CVA with aphasia Continue Eliquis  2.5 with chadsvasc of at least 6.  3. Bradycardia  Stable No presyncopal or syncopal episodes  Not on rate control   4.. HTN/edema/CKD Stable for  pt Seeing a Kidney MD now as well as PCP  Now much improved back on lasix    F/u with Dr. Angelena Form as scheduled     Geroge Baseman. Clydene Burack, [facility] [address] [place], [place] [address] [phone]

## 2022-03-07 ENCOUNTER — Other Ambulatory Visit: Payer: Self-pay | Admitting: Cardiovascular Disease

## 2022-03-07 ENCOUNTER — Other Ambulatory Visit: Payer: Self-pay | Admitting: Family Medicine

## 2022-03-16 ENCOUNTER — Telehealth: Payer: Self-pay | Admitting: Family Medicine

## 2022-03-16 DIAGNOSIS — Z0000 Encounter for general adult medical examination without abnormal findings: Secondary | ICD-10-CM

## 2022-03-16 MED ORDER — TAMSULOSIN HCL 0.4 MG PO CAPS: ORAL_CAPSULE | ORAL | 10 refills | Status: DC

## 2022-03-16 MED ORDER — ESCITALOPRAM OXALATE 20 MG PO TABS: 20.0000 mg | ORAL_TABLET | Freq: Every day | ORAL | 1 refills | Status: DC

## 2022-03-16 NOTE — Telephone Encounter (Signed)
Rx sent to requested pharmacy

## 2022-03-16 NOTE — Telephone Encounter (Signed)
  LAST APPOINTMENT DATE:  10/14/21  NEXT APPOINTMENT DATE: 04/17/22  MEDICATION: escitalopram (LEXAPRO) 20 MG tablet   tamsulosin (FLOMAX) 0.4 MG CAPS capsule   Is the patient out of medication? Will be out next week   PHARMACY:  [place], [place] - [address] [place], [place] [address] Phone: [phone]  Fax: [phone]

## 2022-03-23 DIAGNOSIS — N2581 Secondary hyperparathyroidism of renal origin: Secondary | ICD-10-CM | POA: Diagnosis not present

## 2022-03-23 DIAGNOSIS — D631 Anemia in chronic kidney disease: Secondary | ICD-10-CM | POA: Diagnosis not present

## 2022-03-23 DIAGNOSIS — N184 Chronic kidney disease, stage 4 (severe): Secondary | ICD-10-CM | POA: Diagnosis not present

## 2022-03-23 DIAGNOSIS — I129 Hypertensive chronic kidney disease with stage 1 through stage 4 chronic kidney disease, or unspecified chronic kidney disease: Secondary | ICD-10-CM | POA: Diagnosis not present

## 2022-03-23 DIAGNOSIS — N189 Chronic kidney disease, unspecified: Secondary | ICD-10-CM | POA: Diagnosis not present

## 2022-04-03 ENCOUNTER — Encounter (HOSPITAL_BASED_OUTPATIENT_CLINIC_OR_DEPARTMENT_OTHER): Payer: Medicare Other | Attending: Internal Medicine | Admitting: Internal Medicine

## 2022-04-03 DIAGNOSIS — Z09 Encounter for follow-up examination after completed treatment for conditions other than malignant neoplasm: Secondary | ICD-10-CM | POA: Insufficient documentation

## 2022-04-03 DIAGNOSIS — Z7901 Long term (current) use of anticoagulants: Secondary | ICD-10-CM | POA: Diagnosis not present

## 2022-04-03 DIAGNOSIS — I87311 Chronic venous hypertension (idiopathic) with ulcer of right lower extremity: Secondary | ICD-10-CM | POA: Diagnosis not present

## 2022-04-03 DIAGNOSIS — X58XXXA Exposure to other specified factors, initial encounter: Secondary | ICD-10-CM | POA: Insufficient documentation

## 2022-04-03 DIAGNOSIS — S81801A Unspecified open wound, right lower leg, initial encounter: Secondary | ICD-10-CM

## 2022-04-03 DIAGNOSIS — I69351 Hemiplegia and hemiparesis following cerebral infarction affecting right dominant side: Secondary | ICD-10-CM | POA: Diagnosis not present

## 2022-04-03 DIAGNOSIS — I482 Chronic atrial fibrillation, unspecified: Secondary | ICD-10-CM | POA: Diagnosis not present

## 2022-04-05 NOTE — Progress Notes (Signed)
[name] ([id number]) [id number] Page 1 of 9 Visit Report for 04/03/2022 Chief Complaint Document Details Patient Name: Date of Service: [name], [name] 04/03/2022 12:30 PM Medical Record Number: [id number] Patient Account Number: [id number] Date of Birth/Sex: Treating RN: [birth year] (89 y.o. M) Primary Care Provider: Garret [name] Other Clinician: Referring Provider: Treating Provider/Extender: [name] in Treatment: 0 Information Obtained from: Patient Chief Complaint 04/03/2022; right lower extremity wounds Electronic Signature(s) Signed: 04/03/2022 1:21:38 PM By: [name] Shan DO Entered By: [name] on 04/03/2022 13:15:05 -------------------------------------------------------------------------------- HPI Details Patient Name: Date of Service: [name] 04/03/2022 12:30 PM Medical Record Number: [id number] Patient Account Number: [id number] Date of Birth/Sex: Treating RN: January 05, 1929 (89 y.o. M) Primary Care Provider: Garret [name] Other Clinician: Referring Provider: Treating Provider/Extender: [name] in Treatment: 0 History of Present Illness HPI Description: 11/10/2020 upon evaluation today patient presents for initial evaluation and inspection here in the clinic concerning a wound that he has over the right lower extremity. He has been tolerating the dressing changes currently at home they have been using just more antibiotic ointment and what they could in general in that regard. With that being said the patient does have a history of having frequent issues with this over the past 2 years intermittently. He has chronic venous insufficiency, lymphedema, coronary artery disease, hypertension, paroxysmal atrial fibrillation, long-term use of anticoagulants due to his history of stroke as well where he does have residual right-sided weakness following the cerebrovascular  accident. He also has previously had veins taken from his right leg to provide a harvest material for the coronary artery bypass surgery. Subsequently this is also the leg that seems to swell the most understandably. 11/17/2020 upon evaluation today patient appears to be doing well with regard to his right leg. He is very close to complete resolution here. Unfortunately he has some areas on his left leg which have arisen over the past week. I think this is due to his swelling and I think if we get the swelling under control this will be significantly improved in general. At this point discussed with patient and his daughter today. 11/24/2020 upon evaluation today patient appears to be doing excellent and in fact is completely healed based on what I am seeing today. Unfortunately we have had an issue with getting his juxta light compression although that has been ordered apparently there was a complication with the order. We will get have to follow-up on that. 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 He was seen in our clinic 1-2 years ago for a similar issue. He has Velcro compression stockings however states he does not want to wear them. He has been using antibiotic ointment and keeping the area covered. He currently denies signs of infection including increased warmth, erythema or purulent drainage. ABI in office was 0.89. Electronic Signature(s) Signed: 04/03/2022 1:21:38 PM By: [name] Shan DO Entered By: [name] on 04/03/2022 13:16:58 [name] ([id number]) [id number] Page 2 of 9 -------------------------------------------------------------------------------- Physical Exam Details Patient Name: Date of Service: [name], [name] 04/03/2022 12:30 PM Medical Record Number: [id number] Patient Account Number: [id number] Date of Birth/Sex: Treating RN: 1928-11-29 (89 y.o. M) Primary Care Provider: Garret [name] Other Clinician: Referring Provider: Treating Provider/Extender:  [name] in Treatment: 0 Constitutional respirations regular, non-labored and within target range for patient.. Cardiovascular 2+ dorsalis pedis/posterior tibialis pulses. Psychiatric pleasant and cooperative. Notes Right lower extremity: Venous stasis dermatitis throughout the leg. Several open wounds to the lateral aspect as well as the medial aspect. Granulation tissue present with scant nonviable tissue. No signs of surrounding infection including increased warmth, erythema or purulent drainage. 2+ pitting edema to the knee. Electronic Signature(s) Signed: 04/03/2022 1:21:38 PM By: [name] Shan DO Entered By: [name] on 04/03/2022 13:17:35 -------------------------------------------------------------------------------- Physician Orders Details Patient Name: Date of Service: [name], [name] 04/03/2022 12:30 PM Medical Record Number: [id number] Patient Account Number: [id number] Date of Birth/Sex: Treating RN: Aug 17, 1928 (89 y.o. [name], Meta.Reding Primary Care Provider: Garret [name] Other Clinician: Referring Provider: Treating Provider/Extender: [name] in Treatment: 0 Verbal / Phone Orders: No Diagnosis Coding ICD-10 Coding Code Description S81.801A Unspecified open wound, right lower leg, initial encounter I87.311 Chronic venous hypertension (idiopathic) with ulcer of right lower extremity I48.20 Chronic atrial fibrillation, unspecified Z79.01 Long term (current) use of anticoagulants I69.351 Hemiplegia and hemiparesis following cerebral infarction affecting right dominant side Follow-up Appointments ppointment in 1 week. - Dr. Heber Santa [name] Monday  130pm 04/10/2022 Room 8 Return A ppointment in 2 [name]. - Dr. Heber [name] Tuesday 04/18/2022 Room 8 Return A Anesthetic (In clinic) Topical Lidocaine 4% applied to wound bed Bathing/ Shower/ Hygiene May shower with protection but do not get wound dressing(s) wet. Protect dressing(s) with water repellant cover (for example, large plastic bag) or a cast cover and may then take shower. Edema Control - Lymphedema / SCD / Other Elevate legs to the level of the heart or above for 30 minutes daily and/or when sitting for 3-4 times a day throughout the day. TIWAN, SCHNITKER ([id number]) [id number] Page 3 of 9 A void standing for long periods of time. Exercise regularly Moisturize legs daily. - left leg every night before bed. Compression stocking or Garment 20-30 mm/Hg pressure to: - left leg apply in the morning and remove at night. If compression wraps slide down please call wound center and speak with a nurse. Wound Treatment Wound #4 - Lower Leg Wound Laterality: Right, Lateral Cleanser: Soap and Water 1 x Per Week/30 Days Discharge Instructions: May shower and wash wound with dial antibacterial soap and water prior to dressing change. Cleanser: Vashe 5.8 (oz) 1 x Per Week/30 Days Discharge Instructions: Cleanse the wound with Vashe prior to applying a clean dressing using gauze sponges, not tissue or cotton balls. Peri-Wound Care: Sween Lotion (Moisturizing lotion) 1 x Per Week/30 Days Discharge Instructions: Apply moisturizing lotion as directed Topical: Gentamicin 1 x Per Week/30 Days Discharge Instructions: As directed by physician Topical: Mupirocin Ointment 1 x Per Week/30 Days Discharge Instructions: Apply Mupirocin (Bactroban) as instructed Prim Dressing: Hydrofera Blue Ready Transfer Foam, 4x5 (in/in) 1 x Per Week/30 Days ary Discharge Instructions: Apply to wound bed as instructed Secondary Dressing: ABD Pad, 8x10 1 x Per Week/30 Days Discharge  Instructions: Apply over primary dressing as directed. Compression Wrap: ThreePress (3 layer compression wrap) 1 x Per Week/30 Days Discharge Instructions: Apply three layer compression as directed. Wound #5 - Lower Leg Wound Laterality: Right, Medial Cleanser: Soap and Water 1 x Per Week/30 Days Discharge Instructions: May shower and wash wound with dial antibacterial soap and water prior to dressing change. Cleanser: Vashe 5.8 (oz) 1 x Per Week/30 Days Discharge Instructions: Cleanse the wound with Vashe prior to applying a  clean dressing using gauze sponges, not tissue or cotton balls. Peri-Wound Care: Sween Lotion (Moisturizing lotion) 1 x Per Week/30 Days Discharge Instructions: Apply moisturizing lotion as directed Topical: Gentamicin 1 x Per Week/30 Days Discharge Instructions: As directed by physician Topical: Mupirocin Ointment 1 x Per Week/30 Days Discharge Instructions: Apply Mupirocin (Bactroban) as instructed Prim Dressing: Hydrofera Blue Ready Transfer Foam, 4x5 (in/in) 1 x Per Week/30 Days ary Discharge Instructions: Apply to wound bed as instructed Secondary Dressing: ABD Pad, 8x10 1 x Per Week/30 Days Discharge Instructions: Apply over primary dressing as directed. Compression Wrap: ThreePress (3 layer compression wrap) 1 x Per Week/30 Days Discharge Instructions: Apply three layer compression as directed. Patient Medications llergies: Cipro, Sulfa (Sulfonamide Antibiotics) A Notifications Medication Indication Start End applied only in clinic for2/06/2022 lidocaine debridement. DOSE topical 4 % cream - cream topical once daily Electronic Signature(s) Signed: 04/03/2022 1:21:38 PM By: [name] Shan DO Signed: 04/04/2022 6:17:04 PM By: Deon Pilling RN, BSN Entered By: Deon Pilling on 04/03/2022 13:18:12 [name], [name] ([id number]) [id number] Page 4 of  9 -------------------------------------------------------------------------------- Problem List Details Patient Name: Date of Service: [name], [name] 04/03/2022 12:30 PM Medical Record Number: [id number] Patient Account Number: [id number] Date of Birth/Sex: Treating RN: 1928/11/24 (89 y.o. M) Primary Care Provider: Garret [name] Other Clinician: Referring Provider: Treating Provider/Extender: [name] in Treatment: 0 Active Problems ICD-10 Encounter Code Description Active Date MDM Diagnosis S81.801A Unspecified open wound, right lower leg, initial encounter 04/03/2022 No Yes I87.311 Chronic venous hypertension (idiopathic) with ulcer of right lower extremity 04/03/2022 No Yes I48.20 Chronic atrial fibrillation, unspecified 04/03/2022 No Yes Z79.01 Long term (current) use of anticoagulants 04/03/2022 No Yes I69.351 Hemiplegia and hemiparesis following cerebral infarction affecting right 04/03/2022 No Yes dominant side Inactive Problems Resolved Problems Electronic Signature(s) Signed: 04/03/2022 1:21:38 PM By: [name] Shan DO Entered By: [name] on 04/03/2022 13:14:25 -------------------------------------------------------------------------------- Progress Note Details Patient Name: Date of Service: [name] 04/03/2022 12:30 PM Medical Record Number: [id number] Patient Account Number: [id number] Date of Birth/Sex: Treating RN: 1928-07-26 (89 y.o. M) Primary Care Provider: Garret [name] Other Clinician: Referring Provider: Treating Provider/Extender: [name] in Treatment: 0 Subjective Chief Complaint Information obtained from Patient 04/03/2022; right lower extremity wounds History of Present Illness (HPI) [name], [name] ([id number]) [phone].pdf Page 5 of 9 11/10/2020 upon evaluation today patient presents for initial evaluation and inspection here in the clinic concerning a  wound that he has over the right lower extremity. He has been tolerating the dressing changes currently at home they have been using just more antibiotic ointment and what they could in general in that regard. With that being said the patient does have a history of having frequent issues with this over the past 2 years intermittently. He has chronic venous insufficiency, lymphedema, coronary artery disease, hypertension, paroxysmal atrial fibrillation, long-term use of anticoagulants due to his history of stroke as well where he does have residual right-sided weakness following the cerebrovascular accident. He also has previously had veins taken from his right leg to provide a harvest material for the coronary artery bypass surgery. Subsequently this is also the leg that seems to swell the most understandably. 11/17/2020 upon evaluation today patient appears to be doing well with regard to his right leg. He is very close to complete resolution here. Unfortunately he has some areas on his left leg which have arisen over the past week. I think this is due to his swelling and I think  if we get the swelling under control this will be significantly improved in general. At this point discussed with patient and his daughter today. 11/24/2020 upon evaluation today patient appears to be doing excellent and in fact is completely healed based on what I am seeing today. Unfortunately we have had an issue with getting his juxta light compression although that has been ordered apparently there was a complication with the order. We will get have to follow-up on that. 04/03/2022 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 He was seen in our clinic 1-2 years ago for a similar issue. He has Velcro  compression stockings however states he does not want to wear them. He has been using antibiotic ointment and keeping the area covered. He currently denies signs of infection including increased warmth, erythema or purulent drainage. ABI in office was 0.89. Patient History Allergies Cipro (Reaction: unsure), Sulfa (Sulfonamide Antibiotics) (Reaction: unsure) Family History Diabetes - Siblings, Heart Disease - Mother, Hypertension - Mother, Kidney Disease - Siblings, Stroke - Mother, No family history of Cancer, Hereditary Spherocytosis, Lung Disease, Seizures, Thyroid Problems, Tuberculosis. Social History Former smoker - 31 years ago, Marital Status - Widowed, Alcohol Use - Never, Drug Use - No History, Caffeine Use - Never. Medical History Eyes Denies history of Cataracts, Glaucoma, Optic Neuritis Ear/Nose/Mouth/Throat Denies history of Chronic sinus problems/congestion, Middle ear problems Hematologic/Lymphatic Patient has history of Anemia Denies history of Hemophilia, Human Immunodeficiency Virus, Lymphedema, Sickle Cell Disease Respiratory Denies history of Aspiration, Asthma, Chronic Obstructive Pulmonary Disease (COPD), Pneumothorax, Sleep Apnea, Tuberculosis Cardiovascular Patient has history of Arrhythmia - A. Fib, Coronary Artery Disease, Hypertension Denies history of Angina, Congestive Heart Failure, Hypotension, Myocardial Infarction, Peripheral Arterial Disease, Peripheral Venous Disease, Phlebitis, Vasculitis Gastrointestinal Denies history of Cirrhosis , Colitis, Crohnoos, Hepatitis A, Hepatitis B, Hepatitis C Endocrine Denies history of Type I Diabetes, Type II Diabetes Genitourinary Patient has history of End Stage Renal Disease - Stage IV CKD Immunological Denies history of Lupus Erythematosus, Raynaudoos, Scleroderma Integumentary (Skin) Denies history of History of Burn Musculoskeletal Denies history of Gout, Rheumatoid Arthritis, Osteoarthritis,  Osteomyelitis Neurologic Denies history of Dementia, Neuropathy, Quadriplegia, Paraplegia, Seizure Disorder Oncologic Denies history of Received Chemotherapy, Received Radiation Psychiatric Denies history of Anorexia/bulimia, Confinement Anxiety Hospitalization/Surgery History - CVA 15 years ago. 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 [address] DIQUAN, KASSIS ([id number]) [id number] Page 6 of 9 Constitutional respirations regular, non-labored and within target range for patient.. Vitals Time Taken: 12:45 PM, Height: 73 in, Source: Stated, Weight: 185 lbs, Source: Stated, BMI: 24.4, Temperature: 97.9 F, Pulse: 71 bpm, Respiratory Rate: 16 breaths/min, Blood Pressure: 192/90 mmHg. Cardiovascular 2+ dorsalis pedis/posterior tibialis pulses. Psychiatric pleasant and cooperative. General Notes: Right lower extremity: Venous stasis dermatitis throughout the leg. Several open wounds to the lateral aspect as well as the medial aspect. Granulation tissue present with scant nonviable tissue. No signs of surrounding infection including increased warmth, erythema or purulent drainage. 2+ pitting edema to the knee. Integumentary (Hair, Skin) Wound #4 status is Open. Original cause of wound was Blister. The date acquired was: 03/27/2022.  The wound is located on the Right,Lateral Lower Leg. The wound measures 5.4cm length x 3.7cm width x 0.1cm depth; 15.692cm^2 area and 1.569cm^3 volume. There is Fat Layer (Subcutaneous Tissue) exposed. There is no tunneling or undermining noted. There is a medium amount of serosanguineous drainage noted. The wound margin is distinct with the outline attached to the  wound base. There is medium (34-66%) red, pink granulation within the wound bed. There is a medium (34-66%) amount of necrotic tissue within the wound bed including Adherent Slough. The periwound skin appearance exhibited: Dry/Scaly, Hemosiderin Staining. The periwound skin appearance did not exhibit: Callus, Crepitus, Excoriation, Induration, Rash, Scarring, Maceration, Atrophie Blanche, Cyanosis, Ecchymosis, Mottled, Pallor, Rubor, Erythema. Wound #5 status is Open. Original cause of wound was Gradually Appeared. The date acquired was: 03/27/2022. The wound is located on the Right,Medial Lower Leg. The wound measures 5cm length x 4cm width x 0.1cm depth; 15.708cm^2 area and 1.571cm^3 volume. There is Fat Layer (Subcutaneous Tissue) exposed. There is no tunneling or undermining noted. There is a medium amount of serosanguineous drainage noted. The wound margin is distinct with the outline attached to the wound base. There is medium (34-66%) red, pink granulation within the wound bed. There is a medium (34-66%) amount of necrotic tissue within the wound bed including Adherent Slough. The periwound skin appearance exhibited: Hemosiderin Staining. The periwound skin appearance did not exhibit: Callus, Crepitus, Excoriation, Induration, Rash, Scarring, Dry/Scaly, Maceration, Atrophie Blanche, Cyanosis, Ecchymosis, Mottled, Pallor, Rubor, Erythema. Assessment Active Problems ICD-10 Unspecified open wound, right lower leg, initial encounter Chronic venous hypertension (idiopathic) with ulcer of right lower extremity Chronic atrial fibrillation, unspecified Long term (current) use of anticoagulants Hemiplegia and hemiparesis following cerebral infarction affecting right dominant side Patient presents with nonhealing wounds to the right lower extremity secondary to venous insufficiency. Based on ABIs and palpable pedal pulses he should have adequate blood flow for healing. No signs of infection. Due to  his stroke he has hemiparesis on the right side. This has led to chronic increase in swelling to the side specifically. He will need chronic compression therapy once his wounds healed. We will rediscuss this at next clinic visit. For now I recommended 3 layer compression with Hydrofera Blue and antibiotic ointment. Procedures Wound #4 Pre-procedure diagnosis of Wound #4 is a Venous Leg Ulcer located on the Right,Lateral Lower Leg . There was a Three Layer Compression Therapy Procedure by Deon Pilling, RN. Post procedure Diagnosis Wound #4: Same as Pre-Procedure Wound #5 Pre-procedure diagnosis of Wound #5 is a Venous Leg Ulcer located on the Right,Medial Lower Leg . There was a Three Layer Compression Therapy Procedure by Deon Pilling, RN. Post procedure Diagnosis Wound #5: Same as Pre-Procedure Plan 1. Hydrofera Blue with antibiotic ointment under 3 layer compressionooright lower extremity 2. Follow up in 1 week Electronic Signature(s) Signed: 04/03/2022 1:21:38 PM By: [name] Shan DO [name], [name] 1:21:38 PM By: [name] Shan DO SignedClayton Bibles ([id number]) [id number] Page 7 of 9 Entered By: [name] on 04/03/2022 13:21:15 -------------------------------------------------------------------------------- HxROS Details Patient Name: Date of Service: [name], NETZEL 04/03/2022 12:30 PM Medical Record Number: [id number] Patient Account Number: [id number] Date of Birth/Sex: Treating RN: November 15, 1928 (89 y.o. [name] Primary Care Provider: Garret [name] Other Clinician: Referring Provider: Treating Provider/Extender: [name] in Treatment: 0 Integumentary (Skin) Complaints and Symptoms: Positive for: Wounds - right leg Medical History: Negative for: History of Burn Constitutional Symptoms (General Health) Medical History: Past Medical History Notes: CVA- right sided weakness  Aphasia Eyes Medical  History: Negative for: Cataracts; Glaucoma; Optic Neuritis Past Medical History Notes: macular degeneration Ear/Nose/Mouth/Throat Medical History: Negative for: Chronic sinus problems/congestion; Middle ear problems Hematologic/Lymphatic Medical History: Positive for: Anemia Negative for: Hemophilia; Human Immunodeficiency Virus; Lymphedema; Sickle Cell Disease Respiratory Medical History: Negative for: Aspiration; Asthma; Chronic Obstructive Pulmonary Disease (COPD); Pneumothorax; Sleep Apnea; Tuberculosis Cardiovascular Medical History: Positive for: Arrhythmia - A. Fib; Coronary Artery Disease; Hypertension Negative for: Angina; Congestive Heart Failure; Hypotension; Myocardial Infarction; Peripheral Arterial Disease; Peripheral Venous Disease; Phlebitis; Vasculitis Gastrointestinal Medical History: Negative for: Cirrhosis ; Colitis; Crohns; Hepatitis A; Hepatitis B; Hepatitis C Endocrine Medical History: Negative for: Type I Diabetes; Type II Diabetes Genitourinary Medical History: Positive for: End Stage Renal Disease - Stage IV CKD Past Medical History Notes: BPH KACY, CONELY ([id number]) [id number] Page 8 of 9 Immunological Medical History: Negative for: Lupus Erythematosus; Raynauds; Scleroderma Musculoskeletal Medical History: Negative for: Gout; Rheumatoid Arthritis; Osteoarthritis; Osteomyelitis Neurologic Medical History: Negative for: Dementia; Neuropathy; Quadriplegia; Paraplegia; Seizure Disorder Oncologic Medical History: Negative for: Received Chemotherapy; Received Radiation Past Medical History Notes: Hx squamous cell right hand, 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 Psychiatric Medical History: Negative for: Anorexia/bulimia; Confinement Anxiety Immunizations Pneumococcal Vaccine: Received Pneumococcal Vaccination: No Implantable Devices No devices added Hospitalization / Surgery History Type of Hospitalization/Surgery CVA 15 years  ago Family and Social History Cancer: No; Diabetes: Yes - Siblings; Heart Disease: Yes - Mother; Hereditary Spherocytosis: No; Hypertension: Yes - Mother; Kidney Disease: Yes - Siblings; Lung Disease: No; Seizures: No; Stroke: Yes - Mother; Thyroid Problems: No; Tuberculosis: No; Former smoker - 51 years ago; Marital Status - Widowed; Alcohol Use: Never; Drug Use: No History; Caffeine Use: Never; Financial Concerns: No; Food, Clothing or Shelter Needs: No; Support System Lacking: No; Transportation Concerns: No Electronic Signature(s) Signed: 04/03/2022 1:21:38 PM By: [name] Shan DO Signed: 04/04/2022 6:17:04 PM By: Deon Pilling RN, BSN Entered By: Deon Pilling on 04/03/2022 12:49:22 -------------------------------------------------------------------------------- SuperBill Details Patient Name: Date of Service: [name] 04/03/2022 Medical Record Number: [id number] Patient Account Number: [id number] Date of Birth/Sex: Treating RN: 1929-01-17 (88 y.o. 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 Facility Procedures : CPT4 Code: 97948016 Description: 55374 - WOUND CARE VISIT-LEV 3 EST PT Modifier: Quantity: 1 : CPT4 Code: 82707867 Description: (Facility Use Only) 54492EF - APPLY MULTLAY COMPRS LWR RT LEG Modifier: Quantity: 1 Physician Procedures : CPT4 Code Description Modifier [id number]  [id number] - WC PHYS LEVEL 4 - EST PT ICD-10 Diagnosis Description S81.801A Unspecified open wound, right lower leg, initial encounter I87.311 Chronic venous hypertension (idiopathic) with ulcer of right lower  extremity I48.20 Chronic atrial fibrillation, unspecified 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

## 2022-04-05 NOTE — Progress Notes (Signed)
LE, [name] ([id number]) [id number] Page 1 of 4 Visit Report for 04/03/2022 Abuse Risk Screen Details Patient Name: Date of Service: [name] 04/03/2022 12:30 PM Medical Record Number: [id number] Patient Account Number: [id number] Date of Birth/Sex: Treating RN: 1928-11-05 (89 y.o. [name] Primary Care Kihanna Kamiya: [name] Other Clinician: Referring Julane Crock: Treating Channing Savich/Extender: Nelva Bush in Treatment: 0 Abuse Risk Screen Items Answer ABUSE RISK SCREEN: Has anyone close to you tried to hurt or harm you recentlyo No Do you feel uncomfortable with anyone in your familyo No Has anyone forced you do things that you didnt want to doo No Electronic Signature(s) Signed: 04/04/2022 6:17:04 PM By: Deon Pilling RN, BSN Entered By: Deon Pilling on 04/03/2022 12:49:29 -------------------------------------------------------------------------------- Activities of Daily Living Details Patient Name: Date of Service: [name] 04/03/2022 12:30 PM Medical Record Number: [id number] Patient Account Number: [id number] Date of Birth/Sex: Treating RN: 06-Mar-1928 (89 y.o. [name] Primary Care Taevin Mcferran: [name] Other Clinician: Referring Liboria Putnam: Treating Daine Croker/Extender: Nelva Bush in Treatment: 0 Activities of Daily Living Items Answer Activities of Daily Living (Please select one for each item) Drive Automobile Not Able T Medications ake Completely Able Use T elephone Not Able Care for Appearance Completely Able Use T oilet Completely Able Bath / Shower Completely Able Dress Self Completely Able Feed Self Completely Able Walk Need Assistance Get In / Out Bed Completely Able Housework Completely Able Prepare Meals Completely [place] for Self Need Assistance Electronic Signature(s) Signed: 04/04/2022 6:17:04 PM By:  Deon Pilling RN, BSN Entered By: Deon Pilling on 04/03/2022 12:49:49 [name] ([id number]) [phone] [id number] Page 2 of 4 -------------------------------------------------------------------------------- Education Screening Details Patient Name: Date of Service: [name] 04/03/2022 12:30 PM Medical Record Number: [id number] Patient Account Number: [id number] Date of Birth/Sex: Treating RN: 1928-03-17 (89 y.o. [name] Primary Care [name]: [name] Other Clinician: Referring Jayce Kainz: Treating Ryshawn Sanzone/Extender: Nelva Bush in Treatment: 0 Primary Learner Assessed: Caregiver Reason Patient is not Primary Learner: aphasia Learning Preferences/Education Level/Primary Language Learning Preference: Explanation, Demonstration, Printed Material Highest Education Level: College or Above Preferred Language: English Cognitive Barrier Language Barrier: No Translator Needed: No Memory Deficit: No Emotional Barrier: No Cultural/Religious Beliefs Affecting Medical Care: No Physical Barrier Impaired Vision: Yes macular degeneration Impaired Hearing: Yes HOH Decreased Hand dexterity: No Knowledge/Comprehension Knowledge Level: High Comprehension Level: High Ability to understand written instructions: High Motivation Anxiety Level: Calm Cooperation: Cooperative Education Importance: Acknowledges Need Interest in Health Problems: Asks Questions Perception: Coherent Willingness to Engage in Self-Management High Activities: Readiness to Engage in Self-Management High Activities: Notes Daughter with patient answers for him Aphasia. Electronic Signature(s) Signed: 04/04/2022 6:17:04 PM By: Deon Pilling RN, BSN Entered By: Deon Pilling on 04/03/2022 12:51:02 -------------------------------------------------------------------------------- Fall Risk Assessment Details Patient Name: Date of Service: [name] 04/03/2022 12:30 PM Medical Record Number: [id number] Patient Account Number: [id number] Date of Birth/Sex: Treating RN: [birth year] (89 y.o. [name] Primary Care Lenton Gendreau: [name] Other Clinician: Referring Dwon Sky: Treating Shawnese Magner/Extender: Nelva Bush in Treatment: [address] RENA, SWEEDEN ([id number]) [id number] Page 3 of 4 Fall Risk Assessment Items Have you had 2 or more falls in the last 12 monthso 0 No Have you had any fall that resulted in injury in the last 12 monthso 0 No FALLS RISK SCREEN History of falling - immediate or within 3 months 0 No Secondary diagnosis (  Do you have 2 or more medical diagnoseso) 0 No Ambulatory aid None/bed rest/wheelchair/nurse 0 No Crutches/cane/walker 15 Yes Furniture 0 No Intravenous therapy Access/Saline/Heparin Lock 0 No Gait/Transferring Normal/ bed rest/ wheelchair 0 No Weak (short steps with or without shuffle, stooped but able to lift head while walking, may seek 10 Yes support from furniture) Impaired (short steps with shuffle, may have difficulty arising from chair, head down, impaired 0 No balance) Mental Status Oriented to own ability 0 Yes Electronic Signature(s) Signed: 04/04/2022 6:17:04 PM By: Deon Pilling RN, BSN Entered By: Deon Pilling on 04/03/2022 12:51:16 -------------------------------------------------------------------------------- Foot Assessment Details Patient Name: Date of Service: [name] 04/03/2022 12:30 PM Medical Record Number: [id number] Patient Account Number: [id number] Date of Birth/Sex: Treating RN: [birth year] (89 y.o. [name] Primary Care Ellerie Arenz: [name] Other Clinician: Referring Rosalena Mccorry: Treating Brennyn Haisley/Extender: Nelva Bush in Treatment: 0 Foot Assessment Items Site Locations + = Sensation present, - = Sensation absent, C = Callus, U = Ulcer R = Redness, W  = Warmth, M = Maceration, PU = Pre-ulcerative lesion F = Fissure, S = Swelling, D = Dryness Assessment Right: Left: Other Deformity: No No Prior Foot Ulcer: No No Prior Amputation: No No Charcot Joint: No No VONTAE, COURT ([id number]) [phone] [id number] Page 4 of 4 Ambulatory Status: Ambulatory With Help Assistance Device: Walker Gait: Unsteady Notes patient has aphasia and HOH are to understand on following commands when asked sensation. Electronic Signature(s) Signed: 04/04/2022 6:17:04 PM By: Deon Pilling RN, BSN Entered By: Deon Pilling on 04/03/2022 12:58:42 -------------------------------------------------------------------------------- Nutrition Risk Screening Details Patient Name: Date of Service: REYES, ALDACO 04/03/2022 12:30 PM Medical Record Number: [id number] Patient Account Number: [id number] Date of Birth/Sex: Treating RN: 1928-10-07 (89 y.o. Lorette Ang, Meta.Reding Primary Care Lynx Goodrich: [name] Other Clinician: Referring [name] Rayburn: Treating Camar Guyton/Extender: Nelva Bush in Treatment: 0 Height (in): 73 Weight (lbs): 185 Body Mass Index (BMI): 24.4 Nutrition Risk Screening Items Score Screening NUTRITION RISK SCREEN: I have an illness or condition that made me change the kind and/or amount of food I eat 2 Yes I eat fewer than two meals per day 0 No I eat few fruits and vegetables, or milk products 0 No I have three or more drinks of beer, liquor or wine almost every day 0 No I have tooth or mouth problems that make it hard for me to eat 0 No I don't always have enough money to buy the food I need 0 No I eat alone most of the time 0 No I take three or more different prescribed or over-the-counter drugs a day 1 Yes Without wanting to, I have lost or gained 10 pounds in the last six months 0 No I am not always physically able to shop, cook and/or feed myself 0 No Nutrition Protocols Good Risk  Protocol Moderate Risk Protocol 0 Provide education on nutrition High Risk Proctocol Risk Level: Moderate Risk Score: 3 Electronic Signature(s) Signed: 04/04/2022 6:17:04 PM By: Deon Pilling RN, BSN Entered By: Deon Pilling on 04/03/2022 12:51:28

## 2022-04-06 ENCOUNTER — Encounter (HOSPITAL_COMMUNITY): Payer: Self-pay | Admitting: *Deleted

## 2022-04-07 NOTE — Progress Notes (Signed)
NAHEEM, TENERELLI ([id number]) [id number] Page 1 of 11 Visit Report for 04/03/2022 Allergy List Details Patient Name: Date of Service: [name] [name] 04/03/2022 12:30 PM Medical Record Number: [id number] Patient Account Number: [id number] Date of Birth/Sex: Treating RN: December 16, 1928 (89 y.o. [name] [name] Primary Care Meenakshi Sazama: [name] Other Clinician: Referring Shaft Corigliano: Treating Keyleigh Manninen/Extender: Nelva Bush in Treatment: 0 Allergies Active Allergies Cipro Reaction: unsure Sulfa (Sulfonamide Antibiotics) Reaction: unsure Allergy Notes Electronic Signature(s) Signed: 04/04/2022 6:17:04 PM By: [name] Pilling RN, BSN Entered By: [name] [name] on 04/03/2022 12:46:39 -------------------------------------------------------------------------------- Arrival Information Details Patient Name: Date of Service: [name] [name] 04/03/2022 12:30 PM Medical Record Number: [id number] Patient Account Number: [id number] Date of Birth/Sex: Treating RN: [birth year] (89 y.o. [name] [name], Meta.Reding Primary Care Zasha Belleau: [name] Other Clinician: Referring Jung Ingerson: Treating Kinzee Happel/Extender: Nelva Bush in Treatment: 0 Visit Information Patient Arrived: [name] [name] Time: 12:44 Accompanied By: daughter Transfer Assistance: Manual Patient Identification Verified: Yes Secondary Verification Process Completed: Yes Patient Requires Transmission-Based Precautions: No Patient Has Alerts: Yes Patient Alerts: Patient on Blood Thinner History Since Last Visit Added or deleted any medications: No Any new allergies or adverse reactions: No Had a fall or experienced change in activities of daily living that may affect risk of falls: No Signs or symptoms of abuse/neglect since last visito No Hospitalized since last visit: No Implantable device outside of the clinic excluding cellular tissue based products placed in  the center since last visit: No Has Dressing in Place as Prescribed: No Has Compression in Place as Prescribed: No Pain Present Now: No Electronic Signature(s) Signed: 04/04/2022 6:17:04 PM By: [name] Pilling RN, BSN Entered By: [name] [name] on 04/03/2022 12:46:02 [name] [name] ([id number]) [id number] Page 2 of 11 -------------------------------------------------------------------------------- Clinic Level of Care Assessment Details Patient Name: Date of Service: [name], [name] 04/03/2022 12:30 PM Medical Record Number: [id number] Patient Account Number: [id number] Date of Birth/Sex: Treating RN: 05/12/1928 (89 y.o. [name] [name] Primary Care Shontez Sermon: [name] Other Clinician: Referring Mahin Guardia: Treating Bruin Bolger/Extender: Nelva Bush in Treatment: 0 Clinic Level of Care Assessment Items TOOL 1 Quantity Score X- 1 0 Use when EandM and Procedure is performed on INITIAL visit ASSESSMENTS - Nursing Assessment / Reassessment X- 1 20 General Physical Exam (combine w/ comprehensive assessment (listed just below) when performed on new pt. evals) X- 1 25 Comprehensive Assessment (HX, ROS, Risk Assessments, Wounds Hx, etc.) ASSESSMENTS - Wound and Skin Assessment / Reassessment X- 1 10 Dermatologic / Skin Assessment (not related to wound area) ASSESSMENTS - Ostomy and/or Continence Assessment and Care []$  - 0 Incontinence Assessment and Management []$  - 0 Ostomy Care Assessment and Management (repouching, etc.) PROCESS - Coordination of Care []$  - 0 Simple Patient / Family Education for ongoing care X- 1 20 Complex (extensive) Patient / Family Education for ongoing care X- 1 10 Staff obtains Programmer, systems, Records, T Results / Process Orders est []$  - 0 Staff telephones HHA, Nursing Homes / Clarify orders / etc []$  - 0 Routine Transfer to another Facility (non-emergent condition) []$  - 0 Routine Hospital Admission  (non-emergent condition) X- 1 15 New Admissions / Biomedical engineer / Ordering NPWT Apligraf, etc. , []$  - 0 Emergency Hospital Admission (emergent condition) PROCESS - Special Needs []$  - 0 Pediatric / Minor Patient Management []$  - 0 Isolation Patient Management []$  - 0 Hearing / Language / Visual special needs []$  - 0 Assessment of Community assistance (transportation, D/C planning, etc.) []$  -  0 Additional assistance / Altered mentation []$  - 0 Support Surface(s) Assessment (bed, cushion, seat, etc.) INTERVENTIONS - Miscellaneous []$  - 0 External ear exam []$  - 0 Patient Transfer (multiple staff / Civil Service fast streamer / Similar devices) []$  - 0 Simple Staple / Suture removal (25 or less) []$  - 0 Complex Staple / Suture removal (26 or more) []$  - 0 Hypo/Hyperglycemic Management (do not check if billed separately) X- 1 15 Ankle / Brachial Index (ABI) - do not check if billed separately Has the patient been seen at the hospital within the last three years: Yes Total Score: 115 Level Of Care: New/Established - Level 3 Electronic Signature(s) Signed: 04/04/2022 6:17:04 PM By: [name] Pilling RN, BSN [place], CHARLES2/07/2022 6:17:04 PM By: [name] Pilling RN, BSN SignedClayton [name] ([id number].pdf Page 3 of 11 Entered By: [name] [name] on 04/03/2022 13:44:05 -------------------------------------------------------------------------------- Compression Therapy Details Patient Name: Date of Service: [name], [name] 04/03/2022 12:30 PM Medical Record Number: [id number] Patient Account Number: [id number] Date of Birth/Sex: Treating RN: 1929-02-09 (89 y.o. [name] [name] Primary Care Dwain Huhn: [name] Other Clinician: Referring Kalijah Westfall: Treating Jaleiah Asay/Extender: Nelva Bush in Treatment: 0 Compression Therapy Performed for Wound Assessment: Wound #4 Right,Lateral Lower Leg Performed By: Clinician [name] Pilling, RN Compression Type:  Three Layer Post Procedure Diagnosis Same as Pre-procedure Electronic Signature(s) Signed: 04/04/2022 6:17:04 PM By: [name] Pilling RN, BSN Entered By: [name] [name] on 04/03/2022 13:13:26 -------------------------------------------------------------------------------- Compression Therapy Details Patient Name: Date of Service: [name] [name] 04/03/2022 12:30 PM Medical Record Number: [id number] Patient Account Number: [id number] Date of Birth/Sex: Treating RN: 05-31-1928 (89 y.o. [name] [name] Primary Care Anne Sebring: [name] Other Clinician: Referring Vanesha Athens: Treating Johnica Armwood/Extender: Nelva Bush in Treatment: 0 Compression Therapy Performed for Wound Assessment: Wound #5 Right,Medial Lower Leg Performed By: Clinician [name] Pilling, RN Compression Type: Three Layer Post Procedure Diagnosis Same as Pre-procedure Electronic Signature(s) Signed: 04/04/2022 6:17:04 PM By: [name] Pilling RN, BSN Entered By: [name] [name] on 04/03/2022 13:13:27 -------------------------------------------------------------------------------- Encounter Discharge Information Details Patient Name: Date of Service: [name], [name] 04/03/2022 12:30 PM Medical Record Number: [id number] Patient Account Number: [id number] Date of Birth/Sex: Treating RN: [birth year] (89 y.o. [name] [name] Primary Care Anjulie Dipierro: [name] Other Clinician: Referring Zyla Dascenzo: Treating Sandip Power/Extender: Nelva Bush in Treatment: [address] SHLOMY, PALACIO ([id number]) [id number] Page 4 of 11 Encounter Discharge Information Items Discharge Condition: Stable Ambulatory Status: Walker Discharge Destination: Home Transportation: Private Auto Accompanied By: daughter Schedule Follow-up Appointment: Yes Clinical Summary of Care: Electronic Signature(s) Signed: 04/04/2022 6:17:04 PM By: [name] Pilling RN, BSN Entered By: [name] [name] on  04/03/2022 13:45:18 -------------------------------------------------------------------------------- Lower Extremity Assessment Details Patient Name: Date of Service: [name], [name] 04/03/2022 12:30 PM Medical Record Number: [id number] Patient Account Number: [id number] Date of Birth/Sex: Treating RN: 08-23-1928 (89 y.o. [name] [name] Primary Care Carmela Piechowski: [name] Other Clinician: Referring Makail Watling: Treating Crystian Frith/Extender: Nelva Bush in Treatment: 0 Edema Assessment Assessed: Shirlyn Goltz: No] [name]: Yes] Edema: [Left: Ye] [Right: s] Calf Left: Right: Point of Measurement: 36 cm From Medial Instep 39.5 cm Ankle Left: Right: Point of Measurement: 10 cm From Medial Instep 25 cm Knee To Floor Left: Right: From Medial Instep 46 cm Vascular Assessment Pulses: Dorsalis Pedis Palpable: [Right:Yes] Doppler Audible: [Right:Yes] Blood Pressure: Brachial: [Right:192] Ankle: [Right:Dorsalis Pedis: 170 0.89] Electronic Signature(s) Signed: 04/04/2022 6:17:04 PM By: [name] Pilling RN, BSN Entered By: [name] [name] on 04/03/2022 12:58:52 Multi Wound Chart Details -------------------------------------------------------------------------------- [name] [name],  [name] [name] ([id number]) [id number] Page 5 of 11 Patient Name: Date of Service: [name], [name] 04/03/2022 12:30 PM Medical Record Number: [id number] Patient Account Number: [id number] Date of Birth/Sex: Treating RN: 1928-09-06 (89 y.o. M) Primary Care Micco Bourbeau: [name] Other Clinician: Referring Jasun Gasparini: Treating Tracee Mccreery/Extender: Nelva Bush in Treatment: 0 Vital Signs Height(in): 73 Pulse(bpm): 89 Weight(lbs): 185 Blood Pressure(mmHg): 192/90 Body Mass Index(BMI): 24.4 Temperature(F): 97.9 Respiratory Rate(breaths/min): 16 Wound Assessments Wound Number: 4 5 N/A Photos: N/A Right, Lateral Lower Leg Right, Medial Lower Leg  N/A Wound Location: Blister Gradually Appeared N/A Wounding Event: Venous Leg Ulcer Venous Leg Ulcer N/A Primary Etiology: Lymphedema Lymphedema N/A Secondary Etiology: Anemia, Arrhythmia, Coronary Artery Anemia, Arrhythmia, Coronary Artery N/A Comorbid History: Disease, Hypertension, End Stage Disease, Hypertension, End Stage Renal Disease Renal Disease 03/27/2022 03/27/2022 N/A Date Acquired: 0 0 N/A Weeks of Treatment: Open Open N/A Wound Status: No No N/A Wound Recurrence: Yes Yes N/A Clustered Wound: 2 2 N/A Clustered Quantity: 5.4x3.7x0.1 5x4x0.1 N/A Measurements L x W x D (cm) 15.692 15.708 N/A A (cm) : rea 1.569 1.571 N/A Volume (cm) : Full Thickness Without Exposed Full Thickness Without Exposed N/A Classification: Support Structures Support Structures Medium Medium N/A Exudate Amount: Serosanguineous Serosanguineous N/A Exudate Type: red, brown red, brown N/A Exudate Color: Distinct, outline attached Distinct, outline attached N/A Wound Margin: Medium (34-66%) Medium (34-66%) N/A Granulation Amount: Red, Pink Red, Pink N/A Granulation Quality: Medium (34-66%) Medium (34-66%) N/A Necrotic Amount: Fat Layer (Subcutaneous Tissue): Yes Fat Layer (Subcutaneous Tissue): Yes N/A Exposed Structures: Fascia: No Fascia: No Tendon: No Tendon: No Muscle: No Muscle: No Joint: No Joint: No Bone: No Bone: No Small (1-33%) Small (1-33%) N/A Epithelialization: Excoriation: No Excoriation: No N/A Periwound Skin Texture: Induration: No Induration: No Callus: No Callus: No Crepitus: No Crepitus: No Rash: No Rash: No Scarring: No Scarring: No Dry/Scaly: Yes Maceration: No N/A Periwound Skin Moisture: Maceration: No Dry/Scaly: No Hemosiderin Staining: Yes Hemosiderin Staining: Yes N/A Periwound Skin Color: Atrophie Blanche: No Atrophie Blanche: No Cyanosis: No Cyanosis: No Ecchymosis: No Ecchymosis: No Erythema: No Erythema: No Mottled:  No Mottled: No Pallor: No Pallor: No Rubor: No Rubor: No Compression Therapy Compression Therapy N/A Procedures Performed: Treatment Notes Electronic Signature(s) Signed: 04/03/2022 1:21:38 PM By: Sharlyne Pacas ([id number]) [id number] Page 6 of 11 Entered By: Kalman Shan on 04/03/2022 13:14:40 -------------------------------------------------------------------------------- Multi-Disciplinary Care Plan Details Patient Name: Date of Service: [name], [name] 04/03/2022 12:30 PM Medical Record Number: [id number] Patient Account Number: [id number] Date of Birth/Sex: Treating RN: 1928-05-22 (89 y.o. [name] [name] Primary Care Elysha Daw: [name] Other Clinician: Referring Kanika Bungert: Treating Chaquetta Schlottman/Extender: Nelva Bush in Treatment: 0 Active Inactive Orientation to the Wound Care Program Nursing Diagnoses: Knowledge deficit related to the wound healing center program Goals: Patient/caregiver will verbalize understanding of the [facility] Program Date Initiated: 04/03/2022 Target Resolution Date: 05/04/2022 Goal Status: Active Interventions: Provide education on orientation to the wound center Notes: Wound/Skin Impairment Nursing Diagnoses: Knowledge deficit related to ulceration/compromised skin integrity Goals: Patient/caregiver will verbalize understanding of skin care regimen Date Initiated: 04/03/2022 Target Resolution Date: 05/05/2022 Goal Status: Active Interventions: Assess patient/caregiver ability to perform ulcer/skin care regimen upon admission and as needed Assess ulceration(s) every visit Provide education on ulcer and skin care Treatment Activities: Skin care regimen initiated : 04/03/2022 Topical wound management initiated : 04/03/2022 Notes: Electronic Signature(s) Signed: 04/04/2022 6:17:04 PM By: [name] Pilling RN, BSN Entered By: [name] [name]  on 04/03/2022  13:43:27 -------------------------------------------------------------------------------- Pain Assessment Details Patient Name: Date of Service: [name], [name] 04/03/2022 12:30 PM Medical Record Number: [id number] Patient Account Number: [id number] Date of Birth/Sex: Treating RN: 07-28-1928 (89 y.o. [name] [name] Primary Care Asiya Cutbirth: [name] Other Clinician: [place] [name] ([id number]) [id number] Page 7 of 11 Referring Tkeyah Burkman: Treating Antoniette Peake/Extender: Nelva Bush in Treatment: 0 Active Problems Location of Pain Severity and Description of Pain Patient Has Paino No Site Locations Rate the pain. Current Pain Level: 0 Pain Management and Medication Current Pain Management: Medication: No Cold Application: No Rest: No Massage: No Activity: No T.E.N.S.: No Heat Application: No Leg drop or elevation: No Is the Current Pain Management Adequate: Adequate How does your wound impact your activities of daily livingo Sleep: No Bathing: No Appetite: No Relationship With Others: No Bladder Continence: No Emotions: No Bowel Continence: No Work: No Toileting: No Drive: No Dressing: No Hobbies: No Engineer, maintenance) Signed: 04/04/2022 6:17:04 PM By: [name] Pilling RN, BSN Entered By: [name] [name] on 04/03/2022 12:51:43 -------------------------------------------------------------------------------- Patient/Caregiver Education Details Patient Name: Date of Service: [name] [name] 2/5/2024andnbsp12:30 PM Medical Record Number: [id number] Patient Account Number: [id number] Date of Birth/Gender: Treating RN: [birth year] (89 y.o. [name] [name] Primary Care Physician: [name] Other Clinician: Referring Physician: Treating Physician/Extender: Nelva Bush in Treatment: 0 Education Assessment Education Provided To: Patient Education Topics Provided Wound/Skin  Impairment: Handouts: Caring for Your Ulcer Methods: Explain/Verbal Responses: Reinforcements needed JACARRI, ROIGER ([id number]) [phone].pdf Page 8 of 11 Electronic Signature(s) Signed: 04/04/2022 6:17:04 PM By: [name] Pilling RN, BSN Entered By: [name] [name] on 04/03/2022 13:43:36 -------------------------------------------------------------------------------- Wound Assessment Details Patient Name: Date of Service: [name], [name] 04/03/2022 12:30 PM Medical Record Number: [id number] Patient Account Number: [id number] Date of Birth/Sex: Treating RN: [birth year] (89 y.o. [name] [name], Meta.Reding Primary Care Chan Sheahan: [name] Other Clinician: Referring Naylah Cork: Treating Myrikal Messmer/Extender: Nelva Bush in Treatment: 0 Wound Status Wound Number: 4 Primary Venous Leg Ulcer Etiology: Wound Location: Right, Lateral Lower Leg Secondary Lymphedema Wounding Event: Blister Etiology: Date Acquired: 03/27/2022 Wound Open Weeks Of Treatment: 0 Status: Clustered Wound: Yes Comorbid Anemia, Arrhythmia, Coronary Artery Disease, Hypertension, History: End Stage Renal Disease Photos Wound Measurements Length: (cm) Width: (cm) Depth: (cm) Clustered Quantity: Area: (cm) Volume: (cm) 5.4 % Reduction in Area: 3.7 % Reduction in Volume: 0.1 Epithelialization: Small (1-33%) 2 Tunneling: No 15.692 Undermining: No 1.569 Wound Description Classification: Full Thickness Without Exposed Sup Wound Margin: Distinct, outline attached Exudate Amount: Medium Exudate Type: Serosanguineous Exudate Color: red, brown port Structures Foul Odor After Cleansing: No Slough/Fibrino Yes Wound Bed Granulation Amount: Medium (34-66%) Exposed Structure Granulation Quality: Red, Pink Fascia Exposed: No Necrotic Amount: Medium (34-66%) Fat Layer (Subcutaneous Tissue) Exposed: Yes Necrotic Quality: Adherent Slough Tendon Exposed: No Muscle Exposed:  No Joint Exposed: No Bone Exposed: No [address] EMERSYN, OSTERGARD V ([id number]) [phone].pdf Page 9 of 11 No Abnormalities Noted: No No Abnormalities Noted: No Callus: No Atrophie Blanche: No Crepitus: No Cyanosis: No Excoriation: No Ecchymosis: No Induration: No Erythema: No Rash: No Hemosiderin Staining: Yes Scarring: No Mottled: No Pallor: No Moisture Rubor: No No Abnormalities Noted: No Dry / Scaly: Yes Maceration: No Treatment Notes Wound #4 (Lower Leg) Wound Laterality: Right, Lateral Cleanser Soap and Water Discharge Instruction: May shower and wash wound with dial antibacterial soap and water prior to dressing change. Vashe 5.8 (oz) Discharge Instruction: Cleanse the wound with Vashe prior to  applying a clean dressing using gauze sponges, not tissue or cotton balls. Peri-Wound Care Sween Lotion (Moisturizing lotion) Discharge Instruction: Apply moisturizing lotion as directed Topical Gentamicin Discharge Instruction: As directed by physician Mupirocin Ointment Discharge Instruction: Apply Mupirocin (Bactroban) as instructed Primary Dressing Hydrofera Blue Ready Transfer Foam, 4x5 (in/in) Discharge Instruction: Apply to wound bed as instructed Secondary Dressing ABD Pad, 8x10 Discharge Instruction: Apply over primary dressing as directed. Secured With Compression Wrap ThreePress (3 layer compression wrap) Discharge Instruction: Apply three layer compression as directed. Compression Stockings Add-Ons Electronic Signature(s) Signed: 04/04/2022 6:17:04 PM By: [name] Pilling RN, BSN Entered By: [name] [name] on 04/03/2022 13:13:06 -------------------------------------------------------------------------------- Wound Assessment Details Patient Name: Date of Service: [name], [name] 04/03/2022 12:30 PM Medical Record Number: [id number] Patient Account Number: [id number] Date of Birth/Sex: Treating RN: Sep 08, 1928  (89 y.o. [name] [name] Primary Care Zunairah Devers: [name] Other Clinician: Referring Naama Sappington: Treating Despina Boan/Extender: Nelva Bush in Treatment: 0 Wound Status Wound Number: 5 Primary Venous Leg Ulcer Etiology: Wound Location: Right, Medial Lower Leg CHIOKE, JORDE V ([id number]) [phone].pdf Page 10 of 11 Secondary Lymphedema Wounding Event: Gradually Appeared Etiology: Date Acquired: 03/27/2022 Wound Open Weeks Of Treatment: 0 Status: Clustered Wound: Yes Comorbid Anemia, Arrhythmia, Coronary Artery Disease, Hypertension, History: End Stage Renal Disease Photos Wound Measurements Length: (cm) 5 Width: (cm) 4 Depth: (cm) 0.1 Clustered Quantity: 2 Area: (cm) 15.708 Volume: (cm) 1.571 % Reduction in Area: % Reduction in Volume: Epithelialization: Small (1-33%) Tunneling: No Undermining: No Wound Description Classification: Full Thickness Without Exposed Sup Wound Margin: Distinct, outline attached Exudate Amount: Medium Exudate Type: Serosanguineous Exudate Color: red, brown port Structures Foul Odor After Cleansing: No Slough/Fibrino Yes Wound Bed Granulation Amount: Medium (34-66%) Exposed Structure Granulation Quality: Red, Pink Fascia Exposed: No Necrotic Amount: Medium (34-66%) Fat Layer (Subcutaneous Tissue) Exposed: Yes Necrotic Quality: Adherent Slough Tendon Exposed: No Muscle Exposed: No Joint Exposed: No Bone Exposed: No Periwound Skin Texture Texture Color No Abnormalities Noted: No No Abnormalities Noted: No Callus: No Atrophie Blanche: No Crepitus: No Cyanosis: No Excoriation: No Ecchymosis: No Induration: No Erythema: No Rash: No Hemosiderin Staining: Yes Scarring: No Mottled: No Pallor: No Moisture Rubor: No No Abnormalities Noted: No Dry / Scaly: No Maceration: No Treatment Notes Wound #5 (Lower Leg) Wound Laterality: Right, Medial Cleanser Soap and  Water Discharge Instruction: May shower and wash wound with dial antibacterial soap and water prior to dressing change. Vashe 5.8 (oz) Discharge Instruction: Cleanse the wound with Vashe prior to applying a clean dressing using gauze sponges, not tissue or cotton balls. Peri-Wound Care Sween Lotion (Moisturizing lotion) Discharge Instruction: Apply moisturizing lotion as directed JACO, TREAS ([id number]) [id number] Page 11 of 11 Topical Gentamicin Discharge Instruction: As directed by physician Mupirocin Ointment Discharge Instruction: Apply Mupirocin (Bactroban) as instructed Primary Dressing Hydrofera Blue Ready Transfer Foam, 4x5 (in/in) Discharge Instruction: Apply to wound bed as instructed Secondary Dressing ABD Pad, 8x10 Discharge Instruction: Apply over primary dressing as directed. Secured With Compression Wrap ThreePress (3 layer compression wrap) Discharge Instruction: Apply three layer compression as directed. Compression Stockings Add-Ons Electronic Signature(s) Signed: 04/04/2022 6:17:04 PM By: [name] Pilling RN, BSN Signed: 04/06/2022 5:19:15 PM By: Sharyn Creamer RN, BSN Entered By: Sharyn Creamer on 04/03/2022 12:55:19 -------------------------------------------------------------------------------- Vitals Details Patient Name: Date of Service: NAKODA, COLLARD 04/03/2022 12:30 PM Medical Record Number: [id number] Patient Account Number: [id number] Date of Birth/Sex: Treating RN: May 25, 1928 (89 y.o. [name] [name] Primary Care Daleigh Pollinger: [name] Other Clinician: Referring Matty Deamer:  Treating Demaria Deeney/Extender: Nelva Bush in Treatment: 0 Vital Signs Time Taken: 12:45 Temperature (F): 97.9 Height (in): 73 Pulse (bpm): 71 Source: Stated Respiratory Rate (breaths/min): 16 Weight (lbs): 185 Blood Pressure (mmHg): 192/90 Source: Stated Reference Range: 80 - 120 mg / dl Body Mass Index (BMI):  24.4 Electronic Signature(s) Signed: 04/04/2022 6:17:04 PM By: [name] Pilling RN, BSN Entered By: [name] [name] on 04/03/2022 12:47:19

## 2022-04-10 ENCOUNTER — Encounter (HOSPITAL_BASED_OUTPATIENT_CLINIC_OR_DEPARTMENT_OTHER): Payer: Medicare Other | Admitting: Internal Medicine

## 2022-04-10 DIAGNOSIS — Z09 Encounter for follow-up examination after completed treatment for conditions other than malignant neoplasm: Secondary | ICD-10-CM | POA: Diagnosis not present

## 2022-04-10 DIAGNOSIS — I87311 Chronic venous hypertension (idiopathic) with ulcer of right lower extremity: Secondary | ICD-10-CM

## 2022-04-10 DIAGNOSIS — S81801A Unspecified open wound, right lower leg, initial encounter: Secondary | ICD-10-CM

## 2022-04-10 DIAGNOSIS — Z7901 Long term (current) use of anticoagulants: Secondary | ICD-10-CM | POA: Diagnosis not present

## 2022-04-10 DIAGNOSIS — I482 Chronic atrial fibrillation, unspecified: Secondary | ICD-10-CM | POA: Diagnosis not present

## 2022-04-10 DIAGNOSIS — I69351 Hemiplegia and hemiparesis following cerebral infarction affecting right dominant side: Secondary | ICD-10-CM | POA: Diagnosis not present

## 2022-04-10 DIAGNOSIS — X58XXXA Exposure to other specified factors, initial encounter: Secondary | ICD-10-CM | POA: Diagnosis not present

## 2022-04-11 NOTE — Progress Notes (Signed)
[name] ([id number]) [id number] Page 1 of 10 Visit Report for 04/10/2022 Arrival Information Details Patient Name: Date of Service: [name] 04/10/2022 1:30 PM Medical Record Number: [id number] Patient Account Number: [id number] Date of Birth/Sex: Treating RN: Dec 05, 1928 (89 y.o. [name] Primary Care Evangelia Whitaker: [name] Other Clinician: Referring Janijah Symons: Treating Sheriann Newmann/Extender: Nelva Bush in Treatment: 1 Visit Information History Since Last Visit Added or deleted any medications: No Patient Arrived: Gilford Rile Any new allergies or adverse reactions: No Arrival Time: 13:48 Had a fall or experienced change in No Accompanied By: daughter activities of daily living that may affect Transfer Assistance: None risk of falls: Patient Identification Verified: Yes Signs or symptoms of abuse/neglect since last visito No Patient Requires Transmission-Based Precautions: No Hospitalized since last visit: No Patient Has Alerts: Yes Implantable device outside of the clinic excluding No Patient Alerts: Patient on Blood Thinner cellular tissue based products placed in the center since last visit: Has Compression in Place as Prescribed: Yes Pain Present Now: No Electronic Signature(s) Signed: 04/10/2022 4:13:33 PM By: Erenest Blank Entered By: Erenest Blank on 04/10/2022 13:48:44 -------------------------------------------------------------------------------- Compression Therapy Details Patient Name: Date of Service: [name] Constable. 04/10/2022 1:30 PM Medical Record Number: [id number] Patient Account Number: [id number] Date of Birth/Sex: Treating RN: May 12, 1928 (89 y.o. [name] Primary Care Persephonie Hegwood: [name] Other Clinician: Referring Laikyn Gewirtz: Treating Cheryl Chay/Extender: Nelva Bush in Treatment: 1 Compression Therapy Performed for Wound Assessment: Wound #4  Right,Lateral Lower Leg Performed By: Clinician Deon Pilling, RN Compression Type: Double Layer Post Procedure Diagnosis Same as Pre-procedure Electronic Signature(s) Signed: 04/10/2022 5:04:26 PM By: Deon Pilling RN, BSN Entered By: Deon Pilling on 04/10/2022 14:07:59 [name] ([id number].pdf Page 2 of 10 -------------------------------------------------------------------------------- Compression Therapy Details Patient Name: Date of Service: [name] 04/10/2022 1:30 PM Medical Record Number: [id number] Patient Account Number: [id number] Date of Birth/Sex: Treating RN: 11-24-1928 (89 y.o. [name] Primary Care Obert Espindola: [name] Other Clinician: Referring Kambre Messner: Treating Randilyn Foisy/Extender: Nelva Bush in Treatment: 1 Compression Therapy Performed for Wound Assessment: Wound #5 Right,Medial Lower Leg Performed By: Clinician Deon Pilling, RN Compression Type: Double Layer Post Procedure Diagnosis Same as Pre-procedure Electronic Signature(s) Signed: 04/10/2022 5:04:26 PM By: Deon Pilling RN, BSN Entered By: Deon Pilling on 04/10/2022 14:07:59 -------------------------------------------------------------------------------- Encounter Discharge Information Details Patient Name: Date of Service: [name] Lefort RLES V. 04/10/2022 1:30 PM Medical Record Number: [id number] Patient Account Number: [id number] Date of Birth/Sex: Treating RN: [birth year] (89 y.o. [name] Primary Care Sajid Ruppert: [name] Other Clinician: Referring Stephnie Parlier: Treating Kaimana Neuzil/Extender: Nelva Bush in Treatment: 1 Encounter Discharge Information Items Post Procedure Vitals Discharge Condition: Stable Temperature (F): 98 Ambulatory Status: Walker Pulse (bpm): 48 Discharge Destination: Home Respiratory Rate (breaths/min): 18 Transportation: Private Auto Blood Pressure  (mmHg): 157/88 Accompanied By: daughter Schedule Follow-up Appointment: Yes Clinical Summary of Care: Electronic Signature(s) Signed: 04/10/2022 5:04:26 PM By: Deon Pilling RN, BSN Entered By: Deon Pilling on 04/10/2022 14:11:13 -------------------------------------------------------------------------------- Lower Extremity Assessment Details Patient Name: Date of Service: [name] 04/10/2022 1:30 PM Medical Record Number: [id number] Patient Account Number: [id number] Date of Birth/Sex: Treating RN: 1928/12/25 (89 y.o. [name] Primary Care Margo Lama: [name] Other Clinician: Referring Dashley Monts: Treating Syleena Mchan/Extender: Nelva Bush in Treatment: 1 Edema Assessment Assessed: Shirlyn Goltz: No] [name]: No] Edema: [Left: Ye] [Right: s] Calf LYNCOLN, BRAUN V ([id number]) [phone].pdf Page 3 of 10 Left: Right: Point  of Measurement: 36 cm From Medial Instep 37 cm Ankle Left: Right: Point of Measurement: 10 cm From Medial Instep 25.4 cm Electronic Signature(s) Signed: 04/10/2022 4:13:33 PM By: Erenest Blank Signed: 04/10/2022 5:04:26 PM By: Deon Pilling RN, BSN Entered By: Erenest Blank on 04/10/2022 13:50:22 -------------------------------------------------------------------------------- Multi Wound Chart Details Patient Name: Date of Service: [name] Lefort RLES V. 04/10/2022 1:30 PM Medical Record Number: [id number] Patient Account Number: [id number] Date of Birth/Sex: Treating RN: 1928/05/03 (89 y.o. [name], Meta.Reding Primary Care Icelynn Onken: [name] Other Clinician: Referring Chinenye Katzenberger: Treating Easton Fetty/Extender: Nelva Bush in Treatment: 1 Vital Signs Height(in): 73 Pulse(bpm): 48 Weight(lbs): 185 Blood Pressure(mmHg): 157/88 Body Mass Index(BMI): 24.4 Temperature(F): 98 Respiratory Rate(breaths/min): 18 [4:Photos:] [N/A:N/A] Right, Lateral Lower Leg Right, Medial Lower  Leg N/A Wound Location: Blister Gradually Appeared N/A Wounding Event: Venous Leg Ulcer Venous Leg Ulcer N/A Primary Etiology: Lymphedema Lymphedema N/A Secondary Etiology: Anemia, Arrhythmia, Coronary Artery Anemia, Arrhythmia, Coronary Artery N/A Comorbid History: Disease, Hypertension, End Stage Disease, Hypertension, End Stage Renal Disease Renal Disease 03/27/2022 03/27/2022 N/A Date Acquired: 1 1 N/A Weeks of Treatment: Open Open N/A Wound Status: No No N/A Wound Recurrence: Yes Yes N/A Clustered Wound: 2 2 N/A Clustered Quantity: 1.8x1.2x0.1 0.1x0.1x0.1 N/A Measurements L x W x D (cm) 1.696 0.008 N/A A (cm) : rea 0.17 0.001 N/A Volume (cm) : 89.20% 99.90% N/A % Reduction in Area: 89.20% 99.90% N/A % Reduction in Volume: Full Thickness Without Exposed Full Thickness Without Exposed N/A Classification: Support Structures Support Structures Medium Medium N/A Exudate Amount: Serosanguineous Serosanguineous N/A Exudate Type: red, brown red, brown N/A Exudate Color: Distinct, outline attached Distinct, outline attached N/A Wound Margin: Large (67-100%) Large (67-100%) N/A Granulation Amount: Red, Pink Red, Pink N/A Granulation Quality: None Present (0%) None Present (0%) N/A Necrotic Amount: Fat Layer (Subcutaneous Tissue): Yes Fat Layer (Subcutaneous Tissue): Yes N/A Exposed Structures: Fascia: No Fascia: No Tendon: No Tendon: No FREDDRICK, KONOW ([id number]) [phone].pdf Page 4 of 10 Muscle: No Muscle: No Joint: No Joint: No Bone: No Bone: No Small (1-33%) Large (67-100%) N/A Epithelialization: Debridement - Selective/Open Wound Debridement - Selective/Open Wound N/A Debridement: Pre-procedure Verification/Time Out 14:00 14:00 N/A Taken: Lidocaine 4% Topical Solution Lidocaine 4% Topical Solution N/A Pain Control: [employer] N/A Tissue Debrided: Skin/Epidermis Skin/Epidermis N/A Level: 2.16 1 N/A Debridement A  (sq cm): rea Curette Curette N/A Instrument: Minimum Minimum N/A Bleeding: Pressure Pressure N/A Hemostasis A chieved: 0 0 N/A Procedural Pain: 0 0 N/A Post Procedural Pain: Procedure was tolerated well Procedure was tolerated well N/A Debridement Treatment Response: 1.8x1.2x0.1 1x1x0.1 N/A Post Debridement Measurements L x W x D (cm) 0.17 0.079 N/A Post Debridement Volume: (cm) Excoriation: No Excoriation: No N/A Periwound Skin Texture: Induration: No Induration: No Callus: No Callus: No Crepitus: No Crepitus: No Rash: No Rash: No Scarring: No Scarring: No Dry/Scaly: Yes Maceration: No N/A Periwound Skin Moisture: Maceration: No Dry/Scaly: No Hemosiderin Staining: Yes Hemosiderin Staining: Yes N/A Periwound Skin Color: Atrophie Blanche: No Atrophie Blanche: No Cyanosis: No Cyanosis: No Ecchymosis: No Ecchymosis: No Erythema: No Erythema: No Mottled: No Mottled: No Pallor: No Pallor: No Rubor: No Rubor: No Compression Therapy Compression Therapy N/A Procedures Performed: Debridement Debridement Treatment Notes Wound #4 (Lower Leg) Wound Laterality: Right, Lateral Cleanser Soap and Water Discharge Instruction: May shower and wash wound with dial antibacterial soap and water prior to dressing change. Vashe 5.8 (oz) Discharge Instruction: Cleanse the wound with Vashe prior to applying a clean dressing using gauze sponges, not  tissue or cotton balls. Peri-Wound Care Sween Lotion (Moisturizing lotion) Discharge Instruction: Apply moisturizing lotion as directed Topical Primary Dressing Xeroform Occlusive Gauze Dressing, 4x4 in Discharge Instruction: Apply to wound bed as instructed Secondary Dressing ABD Pad, 8x10 Discharge Instruction: Apply over primary dressing as directed. Secured With Compression Wrap ThreePress (3 layer compression wrap) Discharge Instruction: Apply three layer compression as directed. Compression  Stockings Add-Ons Wound #5 (Lower Leg) Wound Laterality: Right, Medial Cleanser Soap and Water Discharge Instruction: May shower and wash wound with dial antibacterial soap and water prior to dressing change. Vashe 5.8 (oz) Discharge Instruction: Cleanse the wound with Vashe prior to applying a clean dressing using gauze sponges, not tissue or cotton balls. [place], SUDA ([id number]) [id number] Page 5 of 10 Peri-Wound Care Sween Lotion (Moisturizing lotion) Discharge Instruction: Apply moisturizing lotion as directed Topical Primary Dressing Xeroform Occlusive Gauze Dressing, 4x4 in Discharge Instruction: Apply to wound bed as instructed Secondary Dressing ABD Pad, 8x10 Discharge Instruction: Apply over primary dressing as directed. Secured With Compression Wrap ThreePress (3 layer compression wrap) Discharge Instruction: Apply three layer compression as directed. Compression Stockings Add-Ons Electronic Signature(s) Signed: 04/10/2022 3:45:51 PM By: Kalman Shan DO Signed: 04/10/2022 5:04:26 PM By: Deon Pilling RN, BSN Entered By: Kalman Shan on 04/10/2022 14:11:58 -------------------------------------------------------------------------------- Multi-Disciplinary Care Plan Details Patient Name: Date of Service: [name], RODEMAN 04/10/2022 1:30 PM Medical Record Number: [id number] Patient Account Number: [id number] Date of Birth/Sex: Treating RN: 08-11-1928 (89 y.o. [name] Primary Care Lissandra Keil: [name] Other Clinician: Referring Kaylanni Ezelle: Treating Jaquavian Firkus/Extender: Nelva Bush in Treatment: 1 Active Inactive Wound/Skin Impairment Nursing Diagnoses: Knowledge deficit related to ulceration/compromised skin integrity Goals: Patient/caregiver will verbalize understanding of skin care regimen Date Initiated: 04/03/2022 Target Resolution Date: 05/05/2022 Goal Status: Active Interventions: Assess  patient/caregiver ability to perform ulcer/skin care regimen upon admission and as needed Assess ulceration(s) every visit Provide education on ulcer and skin care Treatment Activities: Skin care regimen initiated : 04/03/2022 Topical wound management initiated : 04/03/2022 Notes: Electronic Signature(s) Signed: 04/10/2022 5:04:26 PM By: Deon Pilling RN, BSN Lockhart, CHARLES2/01/2023 5:04:26 PM By: Deon Pilling RN, BSN SignedClayton Bibles ([id number].pdf Page 6 of 10 Entered By: Deon Pilling on 04/10/2022 14:09:01 -------------------------------------------------------------------------------- Pain Assessment Details Patient Name: Date of Service: DAYLEN, [name] 04/10/2022 1:30 PM Medical Record Number: [id number] Patient Account Number: [id number] Date of Birth/Sex: Treating RN: February 19, 1929 (89 y.o. [name] Primary Care Maliki Gignac: [name] Other Clinician: Referring Kandace Elrod: Treating Kaleem Sartwell/Extender: Nelva Bush in Treatment: 1 Active Problems Location of Pain Severity and Description of Pain Patient Has Paino No Site Locations Pain Management and Medication Current Pain Management: Electronic Signature(s) Signed: 04/10/2022 4:13:33 PM By: Erenest Blank Signed: 04/10/2022 5:04:26 PM By: Deon Pilling RN, BSN Entered By: Erenest Blank on 04/10/2022 13:49:34 -------------------------------------------------------------------------------- Patient/Caregiver Education Details Patient Name: Date of Service: [name] Constable 2/12/2024andnbsp1:30 PM Medical Record Number: [id number] Patient Account Number: [id number] Date of Birth/Gender: Treating RN: 02-Jul-1928 (89 y.o. [name] Primary Care Physician: [name] Other Clinician: Referring Physician: Treating Physician/Extender: Nelva Bush in Treatment: 1 Education Assessment Education Provided To: Patient [place], [name]  ([id number]) [id number] Page 7 of 10 Education Topics Provided Wound/Skin Impairment: Handouts: Caring for Your Ulcer Methods: Explain/Verbal Responses: Reinforcements needed Electronic Signature(s) Signed: 04/10/2022 5:04:26 PM By: Deon Pilling RN, BSN Entered By: Deon Pilling on 04/10/2022 14:10:10 -------------------------------------------------------------------------------- Wound Assessment Details Patient Name: Date of Service: [name] Lefort RLES V. 04/10/2022  1:30 PM Medical Record Number: [id number] Patient Account Number: [id number] Date of Birth/Sex: Treating RN: [birth year] (89 y.o. [name], Meta.Reding Primary Care Maki Hege: [name] Other Clinician: Referring Ramatoulaye Pack: Treating Makaleigh Reinard/Extender: Nelva Bush in Treatment: 1 Wound Status Wound Number: 4 Primary Venous Leg Ulcer Etiology: Wound Location: Right, Lateral Lower Leg Secondary Lymphedema Wounding Event: Blister Etiology: Date Acquired: 03/27/2022 Wound Open Weeks Of Treatment: 1 Status: Clustered Wound: Yes Comorbid Anemia, Arrhythmia, Coronary Artery Disease, Hypertension, History: End Stage Renal Disease Photos Wound Measurements Length: (cm) Width: (cm) Depth: (cm) Clustered Quantity: Area: (cm) Volume: (cm) 1.8 % Reduction in Area: 89.2% 1.2 % Reduction in Volume: 89.2% 0.1 Epithelialization: Small (1-33%) 2 Tunneling: No 1.696 Undermining: No 0.17 Wound Description Classification: Full Thickness Without Exposed Sup Wound Margin: Distinct, outline attached Exudate Amount: Medium Exudate Type: Serosanguineous Exudate Color: red, brown port Structures Foul Odor After Cleansing: No Slough/Fibrino Yes Wound Bed Granulation Amount: Large (67-100%) Exposed Structure Granulation Quality: Red, Pink Fascia Exposed: No Necrotic Amount: None Present (0%) Fat Layer (Subcutaneous Tissue) Exposed: Yes Tendon Exposed: No Muscle Exposed:  No Joint Exposed: No JOVONTA, [name] ([id number]) [phone] Page 8 of 10 Bone Exposed: No Periwound Skin Texture Texture Color No Abnormalities Noted: No No Abnormalities Noted: No Callus: No Atrophie Blanche: No Crepitus: No Cyanosis: No Excoriation: No Ecchymosis: No Induration: No Erythema: No Rash: No Hemosiderin Staining: Yes Scarring: No Mottled: No Pallor: No Moisture Rubor: No No Abnormalities Noted: No Dry / Scaly: Yes Maceration: No Treatment Notes Wound #4 (Lower Leg) Wound Laterality: Right, Lateral Cleanser Soap and Water Discharge Instruction: May shower and wash wound with dial antibacterial soap and water prior to dressing change. Vashe 5.8 (oz) Discharge Instruction: Cleanse the wound with Vashe prior to applying a clean dressing using gauze sponges, not tissue or cotton balls. Peri-Wound Care Sween Lotion (Moisturizing lotion) Discharge Instruction: Apply moisturizing lotion as directed Topical Primary Dressing Xeroform Occlusive Gauze Dressing, 4x4 in Discharge Instruction: Apply to wound bed as instructed Secondary Dressing ABD Pad, 8x10 Discharge Instruction: Apply over primary dressing as directed. Secured With Compression Wrap ThreePress (3 layer compression wrap) Discharge Instruction: Apply three layer compression as directed. Compression Stockings Add-Ons Electronic Signature(s) Signed: 04/10/2022 4:13:33 PM By: Erenest Blank Signed: 04/10/2022 5:04:26 PM By: Deon Pilling RN, BSN Entered By: Erenest Blank on 04/10/2022 13:59:22 -------------------------------------------------------------------------------- Wound Assessment Details Patient Name: Date of Service: [name], LASO 04/10/2022 1:30 PM Medical Record Number: [id number] Patient Account Number: [id number] Date of Birth/Sex: Treating RN: [birth year] (89 y.o. [name] Primary Care Tayna Smethurst: [name] Other Clinician: Referring  Caty Tessler: Treating Peng Thorstenson/Extender: Nelva Bush in Treatment: 1 Wound Status Wound Number: 5 Primary Venous Leg Ulcer Etiology: Wound Location: Right, Medial Lower Leg ABRAHAM, BITZER ([id number]) [phone].pdf Page 9 of 10 Secondary Lymphedema Wounding Event: Gradually Appeared Etiology: Date Acquired: 03/27/2022 Wound Open Weeks Of Treatment: 1 Status: Clustered Wound: Yes Comorbid Anemia, Arrhythmia, Coronary Artery Disease, Hypertension, History: End Stage Renal Disease Photos Wound Measurements Length: (cm) Width: (cm) Depth: (cm) Clustered Quantity: Area: (cm) Volume: (cm) 0.1 % Reduction in Area: 99.9% 0.1 % Reduction in Volume: 99.9% 0.1 Epithelialization: Large (67-100%) 2 Tunneling: No 0.008 Undermining: No 0.001 Wound Description Classification: Full Thickness Without Exposed Sup Wound Margin: Distinct, outline attached Exudate Amount: Medium Exudate Type: Serosanguineous Exudate Color: red, brown port Structures Foul Odor After Cleansing: No Slough/Fibrino Yes Wound Bed Granulation Amount: Large (67-100%) Exposed Structure Granulation Quality: Red, Pink Fascia Exposed: No Necrotic  Amount: None Present (0%) Fat Layer (Subcutaneous Tissue) Exposed: Yes Tendon Exposed: No Muscle Exposed: No Joint Exposed: No Bone Exposed: No Periwound Skin Texture Texture Color No Abnormalities Noted: No No Abnormalities Noted: No Callus: No Atrophie Blanche: No Crepitus: No Cyanosis: No Excoriation: No Ecchymosis: No Induration: No Erythema: No Rash: No Hemosiderin Staining: Yes Scarring: No Mottled: No Pallor: No Moisture Rubor: No No Abnormalities Noted: No Dry / Scaly: No Maceration: No Treatment Notes Wound #5 (Lower Leg) Wound Laterality: Right, Medial Cleanser Soap and Water Discharge Instruction: May shower and wash wound with dial antibacterial soap and water prior to dressing  change. Vashe 5.8 (oz) Discharge Instruction: Cleanse the wound with Vashe prior to applying a clean dressing using gauze sponges, not tissue or cotton balls. Peri-Wound Care Sween Lotion (Moisturizing lotion) Discharge Instruction: Apply moisturizing lotion as directed CLEBURNE, MUSTO ([id number]) [id number] Page 10 of 10 Topical Primary Dressing Xeroform Occlusive Gauze Dressing, 4x4 in Discharge Instruction: Apply to wound bed as instructed Secondary Dressing ABD Pad, 8x10 Discharge Instruction: Apply over primary dressing as directed. Secured With Compression Wrap ThreePress (3 layer compression wrap) Discharge Instruction: Apply three layer compression as directed. Compression Stockings Add-Ons Electronic Signature(s) Signed: 04/10/2022 4:13:33 PM By: Erenest Blank Signed: 04/10/2022 5:04:26 PM By: Deon Pilling RN, BSN Entered By: Erenest Blank on 04/10/2022 13:59:46 -------------------------------------------------------------------------------- Vitals Details Patient Name: Date of Service: [name] Lefort RLES V. 04/10/2022 1:30 PM Medical Record Number: [id number] Patient Account Number: [id number] Date of Birth/Sex: Treating RN: [birth year] (89 y.o. [name] Primary Care Werner Labella: [name] Other Clinician: Referring Shonika Kolasinski: Treating Hajra Port/Extender: Nelva Bush in Treatment: 1 Vital Signs Time Taken: 13:48 Temperature (F): 98 Height (in): 73 Pulse (bpm): 48 Weight (lbs): 185 Respiratory Rate (breaths/min): 18 Body Mass Index (BMI): 24.4 Blood Pressure (mmHg): 157/88 Reference Range: 80 - 120 mg / dl Electronic Signature(s) Signed: 04/10/2022 4:13:33 PM By: Erenest Blank Entered By: Erenest Blank on 04/10/2022 14:00:35

## 2022-04-11 NOTE — Progress Notes (Signed)
EITO, OLSAVSKY ([id number]) [id number] Page 1 of 11 Visit Report for 04/10/2022 Chief Complaint Document Details Patient Name: Date of Service: [name], [name] 04/10/2022 1:30 PM Medical Record Number: [id number] Patient Account Number: [id number] Date of Birth/Sex: Treating RN: 03/19/1928 (89 y.o. [name] Primary Care Provider: [name] Other Clinician: Referring Provider: Treating Provider/Extender: Nelva Bush in Treatment: 1 Information Obtained from: Patient Chief Complaint 04/03/2022; right lower extremity wounds Electronic Signature(s) Signed: 04/10/2022 3:45:51 PM By: Kalman Shan DO Entered By: Kalman Shan on 04/10/2022 14:12:11 -------------------------------------------------------------------------------- Debridement Details Patient Name: Date of Service: [name] 04/10/2022 1:30 PM Medical Record Number: [id number] Patient Account Number: [id number] Date of Birth/Sex: Treating RN: 1929/01/09 (89 y.o. Lorette Ang, Meta.Reding Primary Care Provider: [name] Other Clinician: Referring Provider: Treating Provider/Extender: Nelva Bush in Treatment: 1 Debridement Performed for Assessment: Wound #4 Right,Lateral Lower Leg Performed By: Physician Kalman Shan, DO Debridement Type: Debridement Severity of Tissue Pre Debridement: Fat layer exposed Level of Consciousness (Pre-procedure): Awake and Alert Pre-procedure Verification/Time Out Yes - 14:00 Taken: Start Time: 14:01 Pain Control: Lidocaine 4% T opical Solution T Area Debrided (L x W): otal 1.8 (cm) x 1.2 (cm) = 2.16 (cm) Tissue and other material debrided: Viable, Non-Viable, Slough, Skin: Dermis , Skin: Epidermis, Slough Level: Skin/Epidermis Debridement Description: Selective/Open Wound Instrument: Curette Bleeding: Minimum Hemostasis Achieved: Pressure End Time: 14:07 Procedural Pain:  0 Post Procedural Pain: 0 Response to Treatment: Procedure was tolerated well Level of Consciousness (Post- Awake and Alert procedure): Post Debridement Measurements of Total Wound Length: (cm) 1.8 Width: (cm) 1.2 Depth: (cm) 0.1 Volume: (cm) 0.17 Character of Wound/Ulcer Post Debridement: Improved Severity of Tissue Post Debridement: Fat layer exposed TRELL, GETZ V ([id number]) [phone] Page 2 of 11 Post Procedure Diagnosis Same as Pre-procedure Electronic Signature(s) Signed: 04/10/2022 3:45:51 PM By: Kalman Shan DO Signed: 04/10/2022 5:04:26 PM By: Deon Pilling RN, BSN Entered By: Deon Pilling on 04/10/2022 14:07:23 -------------------------------------------------------------------------------- Debridement Details Patient Name: Date of Service: [name] 04/10/2022 1:30 PM Medical Record Number: [id number] Patient Account Number: [id number] Date of Birth/Sex: Treating RN: September 23, 1928 (89 y.o. Lorette Ang, Meta.Reding Primary Care Provider: [name] Other Clinician: Referring Provider: Treating Provider/Extender: Nelva Bush in Treatment: 1 Debridement Performed for Assessment: Wound #5 Right,Medial Lower Leg Performed By: Physician Kalman Shan, DO Debridement Type: Debridement Severity of Tissue Pre Debridement: Fat layer exposed Level of Consciousness (Pre-procedure): Awake and Alert Pre-procedure Verification/Time Out Yes - 14:00 Taken: Start Time: 14:01 Pain Control: Lidocaine 4% T opical Solution T Area Debrided (L x W): otal 1 (cm) x 1 (cm) = 1 (cm) Tissue and other material debrided: Viable, Non-Viable, Slough, Skin: Dermis , Skin: Epidermis, Slough Level: Skin/Epidermis Debridement Description: Selective/Open Wound Instrument: Curette Bleeding: Minimum Hemostasis Achieved: Pressure End Time: 14:07 Procedural Pain: 0 Post Procedural Pain: 0 Response to Treatment: Procedure was  tolerated well Level of Consciousness (Post- Awake and Alert procedure): Post Debridement Measurements of Total Wound Length: (cm) 1 Width: (cm) 1 Depth: (cm) 0.1 Volume: (cm) 0.079 Character of Wound/Ulcer Post Debridement: Improved Severity of Tissue Post Debridement: Fat layer exposed Post Procedure Diagnosis Same as Pre-procedure Electronic Signature(s) Signed: 04/10/2022 3:45:51 PM By: Kalman Shan DO Signed: 04/10/2022 5:04:26 PM By: Deon Pilling RN, BSN Entered By: Deon Pilling on 04/10/2022 14:07:46 HPI Details -------------------------------------------------------------------------------- [name] ([id number]) [id number] Page 3 of 11 Patient Name: Date of Service: [name], [name] 04/10/2022 1:30 PM Medical Record  Number: [id number] Patient Account Number: [id number] Date of Birth/Sex: Treating RN: 14-Dec-1928 (89 y.o. [name] Primary Care Provider: [name] Other Clinician: Referring Provider: Treating Provider/Extender: Nelva Bush in Treatment: 1 History of Present Illness HPI Description: 11/10/2020 upon evaluation today patient presents for initial evaluation and inspection here in the clinic concerning a wound that he has over the right lower extremity. He has been tolerating the dressing changes currently at home they have been using just more antibiotic ointment and what they could in general in that regard. With that being said the patient does have a history of having frequent issues with this over the past 2 years intermittently. He has chronic venous insufficiency, lymphedema, coronary artery disease, hypertension, paroxysmal atrial fibrillation, long-term use of anticoagulants due to his history of stroke as well where he does have residual right-sided weakness following the cerebrovascular accident. He also has previously had veins taken from his right leg to provide a harvest  material for the coronary artery bypass surgery. Subsequently this is also the leg that seems to swell the most understandably. 11/17/2020 upon evaluation today patient appears to be doing well with regard to his right leg. He is very close to complete resolution here. Unfortunately he has some areas on his left leg which have arisen over the past week. I think this is due to his swelling and I think if we get the swelling under control this will be significantly improved in general. At this point discussed with patient and his daughter today. 11/24/2020 upon evaluation today patient appears to be doing excellent and in fact is completely healed based on what I am seeing today. Unfortunately we have had an issue with getting his juxta light compression although that has been ordered apparently there was a complication with the order. We will get have to follow-up on that. 04/03/2022 Mr. 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 He was seen in our clinic 1-2 years ago for a similar issue. He has Velcro compression stockings however states he does not want to wear them. He has been using antibiotic ointment and keeping the area covered. He currently denies signs of infection including increased warmth, erythema or purulent drainage. ABI in office was 0.89. 04/10/22 Patient presents for follow-up. We have been using Hydrofera Blue with antibiotic ointment under 2 layer Coflex to the right lower extremity. He has had improvement in wound healing. He has compression stockings at home. Electronic Signature(s) Signed: 04/10/2022 3:45:51 PM By: Kalman Shan DO Entered By: Kalman Shan on 04/10/2022  14:12:58 -------------------------------------------------------------------------------- Physical Exam Details Patient Name: Date of Service: DANTRELL, [name] 04/10/2022 1:30 PM Medical Record Number: [id number] Patient Account Number: [id number] Date of Birth/Sex: Treating RN: 29-Apr-1928 (89 y.o. [name] Primary Care Provider: [name] Other Clinician: Referring Provider: Treating Provider/Extender: Nelva Bush in Treatment: 1 Constitutional respirations regular, non-labored and within target range for patient.. Cardiovascular 2+ dorsalis pedis/posterior tibialis pulses. Psychiatric pleasant and cooperative. Notes Right lower extremity: Venous stasis dermatitis throughout the leg. Several open wounds to the lateral aspect as well as the medial aspect with granulation tissue and devitalized tissue. No signs of surrounding infection including increased warmth, erythema or purulent drainage. 2+ pitting edema to the knee. Electronic Signature(s) Signed: 04/10/2022  3:45:51 PM By: Kalman Shan DO Entered By: Kalman Shan on 04/10/2022 14:13:43 [name] ([id number]) [id number] Page 4 of 11 -------------------------------------------------------------------------------- Physician Orders Details Patient Name: Date of Service: [name], [name] 04/10/2022 1:30 PM Medical Record Number: [id number] Patient Account Number: [id number] Date of Birth/Sex: Treating RN: 1928-11-04 (89 y.o. Lorette Ang, Meta.Reding Primary Care Provider: [name] Other Clinician: Referring Provider: Treating Provider/Extender: Nelva Bush in Treatment: 1 Verbal / Phone Orders: No Diagnosis Coding ICD-10 Coding Code Description [phone] Unspecified open wound, right lower leg, initial encounter I87.311 Chronic venous hypertension (idiopathic) with ulcer of right lower extremity I48.20 Chronic atrial  fibrillation, unspecified Z79.01 Long term (current) use of anticoagulants I69.351 Hemiplegia and hemiparesis following cerebral infarction affecting right dominant side Follow-up Appointments ppointment in 1 week. - Dr. Heber [place] Tuesday 04/18/2022 130pm Room 8 Return A ppointment in 2 weeks. - Dr. Heber [place] Tuesday 04/25/2022 1100 Room 8 Return A Anesthetic (In clinic) Topical Lidocaine 4% applied to wound bed Bathing/ Shower/ Hygiene May shower with protection but do not get wound dressing(s) wet. Protect dressing(s) with water repellant cover (for example, large plastic bag) or a cast cover and may then take shower. Edema Control - Lymphedema / SCD / Other Elevate legs to the level of the heart or above for 30 minutes daily and/or when sitting for 3-4 times a day throughout the day. A void standing for long periods of time. Exercise regularly Moisturize legs daily. - left leg every night before bed. Compression stocking or Garment 20-30 mm/Hg pressure to: - left leg apply in the morning and remove at night. If compression wraps slide down please call wound center and speak with a nurse. Wound Treatment Wound #4 - Lower Leg Wound Laterality: Right, Lateral Cleanser: Soap and Water 1 x Per Week/30 Days Discharge Instructions: May shower and wash wound with dial antibacterial soap and water prior to dressing change. Cleanser: Vashe 5.8 (oz) 1 x Per Week/30 Days Discharge Instructions: Cleanse the wound with Vashe prior to applying a clean dressing using gauze sponges, not tissue or cotton balls. Peri-Wound Care: Sween Lotion (Moisturizing lotion) 1 x Per Week/30 Days Discharge Instructions: Apply moisturizing lotion as directed Prim Dressing: Xeroform Occlusive Gauze Dressing, 4x4 in 1 x Per Week/30 Days ary Discharge Instructions: Apply to wound bed as instructed Secondary Dressing: ABD Pad, 8x10 1 x Per Week/30 Days Discharge Instructions: Apply over primary dressing as  directed. Compression Wrap: ThreePress (3 layer compression wrap) 1 x Per Week/30 Days Discharge Instructions: Apply three layer compression as directed. Wound #5 - Lower Leg Wound Laterality: Right, Medial Cleanser: Soap and Water 1 x Per Week/30 Days Discharge Instructions: May shower and wash wound with dial antibacterial soap and water prior to dressing change. Cleanser: Vashe 5.8 (oz) 1 x Per Week/30 Days Discharge Instructions: Cleanse the wound with Vashe prior to applying a clean dressing using gauze sponges, not tissue or cotton balls. Peri-Wound Care: Sween Lotion (Moisturizing lotion) 1 x Per Week/30 Days Discharge Instructions: Apply moisturizing lotion as directed Prim Dressing: Xeroform Occlusive Gauze Dressing, 4x4 in ary 1 x Per Week/30 Days OLANDA, CARUFEL ([id number]) [phone].pdf Page 5 of 11 Discharge Instructions: Apply to wound bed as instructed Secondary Dressing: ABD Pad, 8x10 1 x Per Week/30 Days Discharge Instructions: Apply over primary dressing as directed. Compression Wrap: ThreePress (3 layer compression wrap) 1 x Per Week/30 Days Discharge Instructions: Apply three layer compression as directed. Electronic Signature(s) Signed: 04/10/2022 3:45:51 PM By: Kalman Shan DO Entered  By: Kalman Shan on 04/10/2022 14:13:51 -------------------------------------------------------------------------------- Problem List Details Patient Name: Date of Service: QUANTEL, DRISKEL 04/10/2022 1:30 PM Medical Record Number: [id number] Patient Account Number: [id number] Date of Birth/Sex: Treating RN: 05-13-1928 (89 y.o. [name] Primary Care Provider: [name] Other Clinician: Referring Provider: Treating Provider/Extender: Nelva Bush in Treatment: 1 Active Problems ICD-10 Encounter Code Description Active Date MDM Diagnosis S81.801A Unspecified open wound, right lower leg, initial encounter  04/03/2022 No Yes I87.311 Chronic venous hypertension (idiopathic) with ulcer of right lower extremity 04/03/2022 No Yes I48.20 Chronic atrial fibrillation, unspecified 04/03/2022 No Yes Z79.01 Long term (current) use of anticoagulants 04/03/2022 No Yes I69.351 Hemiplegia and hemiparesis following cerebral infarction affecting right 04/03/2022 No Yes dominant side Inactive Problems Resolved Problems Electronic Signature(s) Signed: 04/10/2022 3:45:51 PM By: Kalman Shan DO Entered By: Kalman Shan on 04/10/2022 14:11:42 [name] ([id number]) [id number] Page 6 of 11 -------------------------------------------------------------------------------- Progress Note Details Patient Name: Date of Service: TRENELL, PARROW 04/10/2022 1:30 PM Medical Record Number: [id number] Patient Account Number: [id number] Date of Birth/Sex: Treating RN: 1928-09-28 (89 y.o. [name] Primary Care Provider: [name] Other Clinician: Referring Provider: Treating Provider/Extender: Nelva Bush in Treatment: 1 Subjective Chief Complaint Information obtained from Patient 04/03/2022; right lower extremity wounds History of Present Illness (HPI) 11/10/2020 upon evaluation today patient presents for initial evaluation and inspection here in the clinic concerning a wound that he has over the right lower extremity. He has been tolerating the dressing changes currently at home they have been using just more antibiotic ointment and what they could in general in that regard. With that being said the patient does have a history of having frequent issues with this over the past 2 years intermittently. He has chronic venous insufficiency, lymphedema, coronary artery disease, hypertension, paroxysmal atrial fibrillation, long-term use of anticoagulants due to his history of stroke as well where he does have residual right-sided weakness following the  cerebrovascular accident. He also has previously had veins taken from his right leg to provide a harvest material for the coronary artery bypass surgery. Subsequently this is also the leg that seems to swell the most understandably. 11/17/2020 upon evaluation today patient appears to be doing well with regard to his right leg. He is very close to complete resolution here. Unfortunately he has some areas on his left leg which have arisen over the past week. I think this is due to his swelling and I think if we get the swelling under control this will be significantly improved in general. At this point discussed with patient and his daughter today. 11/24/2020 upon evaluation today patient appears to be doing excellent and in fact is completely healed based on what I am seeing today. Unfortunately we have had an issue with getting his juxta light compression although that has been ordered apparently there was a complication with the order. We will get have to follow-up on that. 04/03/2022 Mr. 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 He was seen in our clinic 1-2 years ago for a similar issue. He has Velcro compression stockings however states he does not want to wear them. He has been using antibiotic ointment and keeping the area covered. He currently denies signs of infection including increased warmth,  erythema or purulent drainage. ABI in office was 0.89. 04/10/22 Patient presents for follow-up. We have been using Hydrofera Blue with antibiotic ointment under 2 layer Coflex to the right lower extremity. He has had improvement in wound healing. He has compression stockings at home. Patient History Family History Diabetes - Siblings, Heart Disease - Mother, Hypertension - Mother, Kidney  Disease - Siblings, Stroke - Mother, No family history of Cancer, Hereditary Spherocytosis, Lung Disease, Seizures, Thyroid Problems, Tuberculosis. Social History Former smoker - 70 years ago, Marital Status - Widowed, Alcohol Use - Never, Drug Use - No History, Caffeine Use - Never. Medical History Eyes Denies history of Cataracts, Glaucoma, Optic Neuritis Ear/Nose/Mouth/Throat Denies history of Chronic sinus problems/congestion, Middle ear problems Hematologic/Lymphatic Patient has history of Anemia Denies history of Hemophilia, Human Immunodeficiency Virus, Lymphedema, Sickle Cell Disease Respiratory Denies history of Aspiration, Asthma, Chronic Obstructive Pulmonary Disease (COPD), Pneumothorax, Sleep Apnea, Tuberculosis Cardiovascular Patient has history of Arrhythmia - A. Fib, Coronary Artery Disease, Hypertension Denies history of Angina, Congestive Heart Failure, Hypotension, Myocardial Infarction, Peripheral Arterial Disease, Peripheral Venous Disease, Phlebitis, Vasculitis Gastrointestinal Denies history of Cirrhosis , Colitis, Crohnoos, Hepatitis A, Hepatitis B, Hepatitis C Endocrine Denies history of Type I Diabetes, Type II Diabetes Genitourinary Patient has history of End Stage Renal Disease - Stage IV CKD Immunological Denies history of Lupus Erythematosus, Raynaudoos, Scleroderma Integumentary (Skin) Denies history of History of Burn Musculoskeletal Denies history of Gout, Rheumatoid Arthritis, Osteoarthritis, Osteomyelitis Neurologic Denies history of Dementia, Neuropathy, Quadriplegia, Paraplegia, Seizure Disorder Oncologic Denies history of Received Chemotherapy, Received Radiation Psychiatric Denies history of Anorexia/bulimia, Confinement Anxiety Hospitalization/Surgery History - CVA 15 years ago. 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 Vitals Time Taken: 1:48 PM, Height: 73 in, Weight: 185 lbs, BMI: 24.4, Temperature: 98 F, Pulse: 48 bpm, Respiratory Rate: 18 breaths/min, Blood Pressure: 157/88 mmHg. Cardiovascular 2+ dorsalis pedis/posterior tibialis pulses. Psychiatric pleasant and cooperative. General Notes: Right lower extremity: Venous stasis dermatitis throughout the leg. Several open wounds to the lateral aspect as well as the medial aspect with granulation tissue and devitalized tissue. No signs of surrounding infection including increased warmth, erythema or purulent drainage. 2+ pitting edema to the knee. Integumentary (Hair, Skin) Wound #4 status is Open. Original cause of wound was Blister. The date acquired was: 03/27/2022. The wound has been in treatment 1 weeks. The wound is located on the Right,Lateral Lower Leg. The wound measures 1.8cm length x 1.2cm width x 0.1cm depth; 1.696cm^2 area and 0.17cm^3 volume. There is Fat Layer (Subcutaneous Tissue) exposed. There is no tunneling or undermining noted. There is a medium amount of serosanguineous drainage noted. The wound margin is distinct with the outline attached to the wound base. There is large (67-100%) red, pink granulation within the wound bed. There is no necrotic tissue within the wound bed. The periwound skin appearance exhibited: Dry/Scaly, Hemosiderin Staining. The periwound skin appearance did not exhibit: Callus, Crepitus, Excoriation, Induration, Rash, Scarring, Maceration, Atrophie Blanche, Cyanosis, Ecchymosis, Mottled, Pallor, Rubor, Erythema. Wound #5 status is Open. Original cause of wound was Gradually Appeared. The date acquired was: 03/27/2022. The wound has been in treatment 1 weeks.  The wound is located on the Right,Medial Lower Leg. The wound measures 0.1cm length x 0.1cm width x 0.1cm depth;  0.008cm^2 area and 0.001cm^3 volume. There is Fat Layer (Subcutaneous Tissue) exposed. There is no tunneling or undermining noted. There is a medium amount of serosanguineous drainage noted. The wound margin is distinct with the outline attached to the wound base. There is large (67-100%) red, pink granulation within the wound bed. There is no necrotic tissue within the wound bed. The periwound skin appearance exhibited: Hemosiderin Staining. The periwound skin appearance did not exhibit: Callus, Crepitus, Excoriation, Induration, Rash, Scarring, Dry/Scaly, Maceration, Atrophie Blanche, Cyanosis, Ecchymosis, Mottled, Pallor, Rubor, Erythema. Assessment Active Problems ICD-10 Unspecified open wound, right lower leg, initial encounter Chronic venous hypertension (idiopathic) with ulcer of right lower extremity Chronic atrial fibrillation, unspecified Long term (current) use of anticoagulants Hemiplegia and hemiparesis following cerebral infarction affecting right dominant side Patient's wounds have shown improvement in size) since last clinic visit. I debrided devitalized tissue. I recommended Xeroform under compression therapy. Follow-up in 1 week. Procedures Wound #4 Pre-procedure diagnosis of Wound #4 is a Venous Leg Ulcer located on the Right,Lateral Lower Leg .Severity of Tissue Pre Debridement is: Fat layer exposed. There was a Selective/Open Wound Skin/Epidermis Debridement with a total area of 2.16 sq cm performed by Kalman Shan, DO. With the following instrument(s): Curette to remove Viable and Non-Viable tissue/material. Material removed includes Slough, Skin: Dermis, and Skin: Epidermis after achieving pain control using Lidocaine 4% Topical Solution. A time out was conducted at 14:00, prior to the start of the procedure. A Minimum amount of bleeding was controlled with  Pressure. The procedure was tolerated well with a pain level of 0 throughout and a pain level of 0 following the procedure. Post Debridement Measurements: 1.8cm length x 1.2cm width x 0.1cm depth; 0.17cm^3 volume. Character of Wound/Ulcer Post Debridement is improved. Severity of Tissue Post Debridement is: Fat layer exposed. Post procedure Diagnosis Wound #4: Same as Pre-Procedure DOREAN, WOLAK ([id number]) [id number] Page 8 of 11 Pre-procedure diagnosis of Wound #4 is a Venous Leg Ulcer located on the Right,Lateral Lower Leg . There was a Double Layer Compression Therapy Procedure by Deon Pilling, RN. Post procedure Diagnosis Wound #4: Same as Pre-Procedure Wound #5 Pre-procedure diagnosis of Wound #5 is a Venous Leg Ulcer located on the Right,Medial Lower Leg .Severity of Tissue Pre Debridement is: Fat layer exposed. There was a Selective/Open Wound Skin/Epidermis Debridement with a total area of 1 sq cm performed by Kalman Shan, DO. With the following instrument(s): Curette to remove Viable and Non-Viable tissue/material. Material removed includes Slough, Skin: Dermis, and Skin: Epidermis after achieving pain control using Lidocaine 4% Topical Solution. A time out was conducted at 14:00, prior to the start of the procedure. A Minimum amount of bleeding was controlled with Pressure. The procedure was tolerated well with a pain level of 0 throughout and a pain level of 0 following the procedure. Post Debridement Measurements: 1cm length x 1cm width x 0.1cm depth; 0.079cm^3 volume. Character of Wound/Ulcer Post Debridement is improved. Severity of Tissue Post Debridement is: Fat layer exposed. Post procedure Diagnosis Wound #5: Same as Pre-Procedure Pre-procedure diagnosis of Wound #5 is a Venous Leg Ulcer located on the Right,Medial Lower Leg . There was a Double Layer Compression Therapy Procedure by Deon Pilling, RN. Post procedure Diagnosis Wound #5: Same  as Pre-Procedure Plan Follow-up Appointments: Return Appointment in 1 week. - Dr. Heber [place] Tuesday 04/18/2022 130pm Room 8 Return Appointment in 2 weeks. - Dr. Heber [place] Tuesday 04/25/2022 1100 Room 8 Anesthetic: (In clinic) Topical Lidocaine 4% applied to wound bed Bathing/ Shower/ Hygiene:  May shower with protection but do not get wound dressing(s) wet. Protect dressing(s) with water repellant cover (for example, large plastic bag) or a cast cover and may then take shower. Edema Control - Lymphedema / SCD / Other: Elevate legs to the level of the heart or above for 30 minutes daily and/or when sitting for 3-4 times a day throughout the day. Avoid standing for long periods of time. Exercise regularly Moisturize legs daily. - left leg every night before bed. Compression stocking or Garment 20-30 mm/Hg pressure to: - left leg apply in the morning and remove at night. If compression wraps slide down please call wound center and speak with a nurse. WOUND #4: - Lower Leg Wound Laterality: Right, Lateral Cleanser: Soap and Water 1 x Per Week/30 Days Discharge Instructions: May shower and wash wound with dial antibacterial soap and water prior to dressing change. Cleanser: Vashe 5.8 (oz) 1 x Per Week/30 Days Discharge Instructions: Cleanse the wound with Vashe prior to applying a clean dressing using gauze sponges, not tissue or cotton balls. Peri-Wound Care: Sween Lotion (Moisturizing lotion) 1 x Per Week/30 Days Discharge Instructions: Apply moisturizing lotion as directed Prim Dressing: Xeroform Occlusive Gauze Dressing, 4x4 in 1 x Per Week/30 Days ary Discharge Instructions: Apply to wound bed as instructed Secondary Dressing: ABD Pad, 8x10 1 x Per Week/30 Days Discharge Instructions: Apply over primary dressing as directed. Com pression Wrap: ThreePress (3 layer compression wrap) 1 x Per Week/30 Days Discharge Instructions: Apply three layer compression as directed. WOUND #5: - Lower Leg  Wound Laterality: Right, Medial Cleanser: Soap and Water 1 x Per Week/30 Days Discharge Instructions: May shower and wash wound with dial antibacterial soap and water prior to dressing change. Cleanser: Vashe 5.8 (oz) 1 x Per Week/30 Days Discharge Instructions: Cleanse the wound with Vashe prior to applying a clean dressing using gauze sponges, not tissue or cotton balls. Peri-Wound Care: Sween Lotion (Moisturizing lotion) 1 x Per Week/30 Days Discharge Instructions: Apply moisturizing lotion as directed Prim Dressing: Xeroform Occlusive Gauze Dressing, 4x4 in 1 x Per Week/30 Days ary Discharge Instructions: Apply to wound bed as instructed Secondary Dressing: ABD Pad, 8x10 1 x Per Week/30 Days Discharge Instructions: Apply over primary dressing as directed. Com pression Wrap: ThreePress (3 layer compression wrap) 1 x Per Week/30 Days Discharge Instructions: Apply three layer compression as directed. 1. In office sharp debridement 2. Xeroform under 2 layer Coflexooright lower extremity 3. Follow-up in 1 week Electronic Signature(s) Signed: 04/10/2022 3:45:51 PM By: Kalman Shan DO Entered By: Kalman Shan on 04/10/2022 14:14:29 [name] ([id number]) [id number] Page 9 of 11 -------------------------------------------------------------------------------- HxROS Details Patient Name: Date of Service: JAMEISON, ANUSZEWSKI 04/10/2022 1:30 PM Medical Record Number: [id number] Patient Account Number: [id number] Date of Birth/Sex: Treating RN: [birth year] (89 y.o. [name] Primary Care Provider: [name] Other Clinician: Referring Provider: Treating Provider/Extender: Nelva Bush in Treatment: 1 Constitutional Symptoms (General Health) Medical History: Past Medical History Notes: CVA- right sided weakness Aphasia Eyes Medical History: Negative for: Cataracts; Glaucoma; Optic Neuritis Past Medical History  Notes: macular degeneration Ear/Nose/Mouth/Throat Medical History: Negative for: Chronic sinus problems/congestion; Middle ear problems Hematologic/Lymphatic Medical History: Positive for: Anemia Negative for: Hemophilia; Human Immunodeficiency Virus; Lymphedema; Sickle Cell Disease Respiratory Medical History: Negative for: Aspiration; Asthma; Chronic Obstructive Pulmonary Disease (COPD); Pneumothorax; Sleep Apnea; Tuberculosis Cardiovascular Medical History: Positive for: Arrhythmia - A. Fib; Coronary Artery Disease; Hypertension Negative for: Angina; Congestive Heart Failure; Hypotension; Myocardial Infarction; Peripheral Arterial  Disease; Peripheral Venous Disease; Phlebitis; Vasculitis Gastrointestinal Medical History: Negative for: Cirrhosis ; Colitis; Crohns; Hepatitis A; Hepatitis B; Hepatitis C Endocrine Medical History: Negative for: Type I Diabetes; Type II Diabetes Genitourinary Medical History: Positive for: End Stage Renal Disease - Stage IV CKD Past Medical History Notes: BPH Immunological Medical History: Negative for: Lupus Erythematosus; Raynauds; Scleroderma Integumentary (Skin) Medical History: Negative for: History of Burn [name], [name] ([id number]) [id number] Page 10 of 11 Musculoskeletal Medical History: Negative for: Gout; Rheumatoid Arthritis; Osteoarthritis; Osteomyelitis Neurologic Medical History: Negative for: Dementia; Neuropathy; Quadriplegia; Paraplegia; Seizure Disorder Oncologic Medical History: Negative for: Received Chemotherapy; Received Radiation Past Medical History Notes: Hx squamous cell right hand, 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 Psychiatric Medical History: Negative for: Anorexia/bulimia; Confinement Anxiety Immunizations Pneumococcal Vaccine: Received Pneumococcal Vaccination: No Implantable Devices No devices added Hospitalization / Surgery History Type of Hospitalization/Surgery CVA 15 years  ago Family and Social History Cancer: No; Diabetes: Yes - Siblings; Heart Disease: Yes - Mother; Hereditary Spherocytosis: No; Hypertension: Yes - Mother; Kidney Disease: Yes - Siblings; Lung Disease: No; Seizures: No; Stroke: Yes - Mother; Thyroid Problems: No; Tuberculosis: No; Former smoker - 74 years ago; Marital Status - Widowed; Alcohol Use: Never; Drug Use: No History; Caffeine Use: Never; Financial Concerns: No; Food, Clothing or Shelter Needs: No; Support System Lacking: No; Transportation Concerns: No Electronic Signature(s) Signed: 04/10/2022 3:45:51 PM By: Kalman Shan DO Signed: 04/10/2022 5:04:26 PM By: Deon Pilling RN, BSN Entered By: Kalman Shan on 04/10/2022 14:13:04 -------------------------------------------------------------------------------- SuperBill Details Patient Name: Date of Service: [name] 04/10/2022 Medical Record Number: [id number] Patient Account Number: [id number] Date of Birth/Sex: Treating RN: 1929-01-12 (89 y.o. Lorette Ang, Meta.Reding Primary Care Provider: [name] Other Clinician: Referring Provider: Treating Provider/Extender: Nelva Bush in Treatment: 1 Diagnosis Coding ICD-10 Codes Code Description [phone] Unspecified open wound, right lower leg, initial encounter I87.311 Chronic venous hypertension (idiopathic) with ulcer of right lower extremity I48.20 Chronic atrial fibrillation, unspecified Z79.01 Long term (current) use of anticoagulants I69.351 Hemiplegia and hemiparesis following cerebral infarction affecting right dominant side Facility Procedures : [place] [place] CodeAlden Hipp ([id number]) [id number] IC Description: [id number] - DEBRIDE WOUND 1ST 20 SQ CM OR < D-10 Diagnosis Description S81.801A Unspecified open wound, right lower leg, initial encounter I87.311 Chronic venous hypertension (idiopathic) with ulcer of right lower extremity Modifier: [id number] Quantity:  27.pdf Page 11 of 11 Physician Procedures : CPT4 Code Description Modifier [id number] - WC PHYS DEBR WO ANESTH 20 SQ CM ICD-10 Diagnosis Description S81.801A Unspecified open wound, right lower leg, initial encounter I87.311 Chronic venous hypertension (idiopathic) with ulcer of right lower  extremity Quantity: 1 Electronic Signature(s) Signed: 04/10/2022 3:45:51 PM By: Kalman Shan DO Entered By: Kalman Shan on 04/10/2022 14:14:44

## 2022-04-13 ENCOUNTER — Encounter (HOSPITAL_BASED_OUTPATIENT_CLINIC_OR_DEPARTMENT_OTHER): Payer: Self-pay | Admitting: Internal Medicine

## 2022-04-17 ENCOUNTER — Ambulatory Visit (INDEPENDENT_AMBULATORY_CARE_PROVIDER_SITE_OTHER): Payer: Medicare Other | Admitting: Family Medicine

## 2022-04-17 ENCOUNTER — Encounter: Payer: Self-pay | Admitting: Family Medicine

## 2022-04-17 VITALS — BP 152/62 | HR 52 | Temp 97.8°F | Ht 72.0 in | Wt 183.0 lb

## 2022-04-17 DIAGNOSIS — D696 Thrombocytopenia, unspecified: Secondary | ICD-10-CM

## 2022-04-17 DIAGNOSIS — N401 Enlarged prostate with lower urinary tract symptoms: Secondary | ICD-10-CM

## 2022-04-17 DIAGNOSIS — I482 Chronic atrial fibrillation, unspecified: Secondary | ICD-10-CM | POA: Diagnosis not present

## 2022-04-17 DIAGNOSIS — E785 Hyperlipidemia, unspecified: Secondary | ICD-10-CM

## 2022-04-17 DIAGNOSIS — N184 Chronic kidney disease, stage 4 (severe): Secondary | ICD-10-CM

## 2022-04-17 DIAGNOSIS — N138 Other obstructive and reflux uropathy: Secondary | ICD-10-CM

## 2022-04-17 DIAGNOSIS — F325 Major depressive disorder, single episode, in full remission: Secondary | ICD-10-CM

## 2022-04-17 DIAGNOSIS — Z0000 Encounter for general adult medical examination without abnormal findings: Secondary | ICD-10-CM | POA: Diagnosis not present

## 2022-04-17 DIAGNOSIS — R413 Other amnesia: Secondary | ICD-10-CM

## 2022-04-17 DIAGNOSIS — I5032 Chronic diastolic (congestive) heart failure: Secondary | ICD-10-CM | POA: Diagnosis not present

## 2022-04-17 DIAGNOSIS — I69351 Hemiplegia and hemiparesis following cerebral infarction affecting right dominant side: Secondary | ICD-10-CM

## 2022-04-17 DIAGNOSIS — I2510 Atherosclerotic heart disease of native coronary artery without angina pectoris: Secondary | ICD-10-CM

## 2022-04-17 MED ORDER — MEMANTINE HCL 10 MG PO TABS: 10.0000 mg | ORAL_TABLET | Freq: Every day | ORAL | 3 refills | Status: DC

## 2022-04-17 MED ORDER — AMLODIPINE BESYLATE 2.5 MG PO TABS
ORAL_TABLET | ORAL | 3 refills | Status: DC
Start: 2022-04-17 — End: 2022-04-19

## 2022-04-17 MED ORDER — TAMSULOSIN HCL 0.4 MG PO CAPS
ORAL_CAPSULE | ORAL | 3 refills | Status: DC
Start: 2022-04-17 — End: 2022-07-12

## 2022-04-17 NOTE — Patient Instructions (Addendum)
consider shingrix at pharmacy- consider covid shot next time a new one is released   - blood pressure slightly high today but was [facility] 138[facility] center on repeat- they will let me know if runs high at wound center on follow up visits- I was hesitant to increase dose with him living alone and potential to make him lightheaded/cause falls  Please stop by lab before you go If you have mychart- we will send your results within 3 business days of [place] receiving them.  If you do not have mychart- we will call you about results within 5 business days of [place] receiving them.  *please also note that you will see labs on mychart as soon as they post. I will later go in and write notes on them- will say "notes from Dr. Yong Channel"   Recommended follow up: Return in about 6 months (around 10/16/2022) for followup or sooner if needed.Schedule b4 you leave.

## 2022-04-17 NOTE — Progress Notes (Addendum)
Phone: [phone]   Subjective:  Patient presents today for their annual physical. Chief complaint-noted.   See problem oriented charting- ROS- full  review of systems was completed and negative  except for: level 5 caveat applies  The following were reviewed and entered/updated in epic: Past Medical History:  Diagnosis Date   Actinic keratosis    BPH with urinary obstruction    Bradycardia    CAD (coronary artery disease)    CABG x5   Cerebrovascular accident (stroke) ([place])    With right hemiparesis and persistent expressive aphasia   GERD (gastroesophageal reflux disease)    History of hydronephrosis    Hx of hydronephrosis 07/08/2011   right    Hyperlipidemia    Hypertension    Iron deficiency anemia due to chronic blood loss 03/26/2007   Now resolved     Iron deficiency anemia secondary to blood loss (chronic)    Persistent atrial fibrillation ([place])    Pulmonary hypertension ([place])    by echo 2013   Rosacea    Situational depression    Severe   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   treated after biopsy   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   KA - treated after biopsy   Tricuspid regurgitation    Patient Active Problem List   Diagnosis Date Noted   Heart failure with preserved ejection fraction ([place]) 06/14/2021    Priority: High   CKD (chronic kidney disease), stage IV ([place]) 11/22/2015    Priority: High   Memory loss 07/21/2014    Priority: High   Chronic atrial fibrillation ([place]) 07/08/2011    Priority: High   Hemiparesis affecting right side as late effect of cerebrovascular accident ([place]) 03/26/2007    Priority: High   CAD (coronary artery disease) 08/23/2006    Priority: High   Macular degeneration 01/21/2020    Priority: Medium    Venous insufficiency 10/02/2016    Priority: Medium    Right hydrocele 04/03/2016    Priority: Medium    Thrombocytopenia ([place]) 11/22/2015    Priority: Medium     Bradycardia 07/09/2011    Priority: Medium    Major depression in remission ([place]) 07/26/2007    Priority: Medium    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    Priority: Medium    Hyperlipidemia 08/23/2006    Priority: Medium    Essential hypertension 08/23/2006    Priority: Medium    Disorder of bone and cartilage 06/14/2021    Priority: Low   Disorder of gallbladder 06/14/2021    Priority: Low   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     Priority: Low   Left wrist pain 11/01/2016    Priority: Low   Sensorineural hearing loss (SNHL), bilateral 07/06/2015    Priority: Low   Actinic keratosis 07/30/2009    Priority: Low   GERD 08/23/2006    Priority: Low   Past Surgical History:  Procedure Laterality Date   ABDOMINAL SURGERY     BACK SURGERY     BAND HEMORRHOIDECTOMY     CHOLECYSTECTOMY     CORONARY ARTERY BYPASS GRAFT     x 5   dental implants     HERNIA REPAIR     KIDNEY SURGERY     stenosis with stent   TONSILLECTOMY      Family History  Problem Relation Age of Onset   Heart disease Father    Heart  attack Other        fhx   Coronary artery disease Other        fhx    Medications- reviewed and updated Current Outpatient Medications  Medication Sig Dispense Refill   ELIQUIS 2.5 MG TABS tablet TAKE 1 TABLET(2.5 MG) BY MOUTH TWICE DAILY 60 tablet 11   escitalopram (LEXAPRO) 20 MG tablet Take 1 tablet (20 mg total) by mouth daily. 90 tablet 1   Ferrous Sulfate (IRON PO) Take 1 tablet by mouth daily.     furosemide (LASIX) 40 MG tablet TAKE 1 TABLET BY MOUTH ONCE DAILY 90 tablet 3   Lansoprazole (PREVACID PO) Take 15 mg by mouth daily.     Multiple Vitamin (MULTIVITAMIN) tablet Take 1 tablet by mouth daily.     rosuvastatin (CRESTOR) 20 MG tablet TAKE 1 TABLET BY MOUTH EVERY DAY 90 tablet 3   triamcinolone cream (KENALOG) 0.1 % Apply 1 application topically 2 (two) times daily. For 7-10 days maximum 160 g 0   amLODipine (NORVASC)  2.5 MG tablet TAKE 1 TABLET BY MOUTH ONCE DAILY 90 tablet 3   memantine (NAMENDA) 10 MG tablet Take 1 tablet (10 mg total) by mouth daily. 90 tablet 3   tamsulosin (FLOMAX) 0.4 MG CAPS capsule TAKE 1 CAPSULE BY MOUTH EVERY NIGHT AT BEDTIME *DO NOT CRUSH OR CHEW* **DO NOT OPEN CAPSULE** 90 capsule 3   No current facility-administered medications for this visit.    Allergies-reviewed and updated Allergies  Allergen Reactions   Ciprofloxacin Hcl     unknown   Sulfamethoxazole-Trimethoprim     unknown    Social History   Social History Narrative   Lives in [place] with spouse. LIves at a house at a retirement home. 3 children. 6 grandkids. 5 greatgrandkids.       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 Oropharynx normal Neck: no thyromegaly CV: RRR no murmurs rubs or gallops Lungs: CTAB no crackles, wheeze, rhonchi Abdomen: soft/nontender/nondistended/normal bowel sounds. No rebound or guarding.  Ext: 1+ edema on left, 1-2+ on right Skin: warm, dry Neuro: grossly normal, moves all extremities- weak on right side, PERRLA    Assessment and Plan  89 y.o. male presenting for annual physical.  Health Maintenance counseling: 1. Anticipatory guidance: Patient counseled regarding regular dental exams - finally stabilized - a lot of work last year- 2 teeth left per daughter, eye exams-has macular degeneration and follows yearly in past (has opted out for quality purposes)- not doing puzzles or tv- family not sure if he can see well,  avoiding smoking and second hand smoke , limiting alcohol to 2 beverages per day-still has 1 glass of wine per day at 5 PM-have advised against with memory issues, no illicit  drugs .   2. Risk factor reduction:  Advised patient of need for regular exercise and diet rich and fruits and vegetables to reduce risk of heart attack and stroke.  Exercise- walking some to get to [employer] daily- takes 5 minutes there and back plus gets mail- getting fair amount of steps.  Diet/weight management-weight down 5 pounds from last physical-discussed weight maintenance.  [place] contribted Wt Readings from Last 3 Encounters:  04/17/22 183 lb (83 kg)  01/17/22 183 lb 9.6 oz (83.3 kg)  12/20/21 182 lb 3.2 oz (82.6 kg)  3. Immunizations/screenings/ancillary studies-discussed Shingrix at pharmacy- holding off, COVID-19 vaccination-they declined as had covid In sept- hold off until next season  Immunization History  Administered Date(s) Administered   Fluad Quad(high Dose 65+) 01/21/2020, 01/27/2021   Influenza Split 01/09/2011   Influenza Whole 02/28/2003, 12/18/2007, 11/24/2009, 11/08/2018   Influenza, High Dose Seasonal PF 10/28/2009, 11/22/2015, 01/22/2018   Influenza-Unspecified 11/27/2000, 11/27/2001, 02/28/2003, 12/01/2003, 12/28/2004, 01/08/2006, 11/28/2006, 10/29/2007, 11/27/2016   Moderna Covid-19 Vaccine Bivalent Booster 76yr & up 02/03/2020   Moderna Sars-Covid-2 Vaccination 04/01/2019, 04/29/2019   Pneumococcal Conjugate-13 07/21/2014   Pneumococcal Polysaccharide-23 02/28/2004   Pneumococcal-Unspecified 02/28/1999   Td 02/27/2005, 08/11/2019  4. Prostate cancer screening- past age based screening recommendations-no reported significant change in urinary symptoms Lab Results  Component Value Date   PSA 0.47 08/11/2013   PSA 0.52 04/26/2007   5. Colon cancer screening - no blood in stool or melena-past age based screening recommendations 6. Skin cancer screening-sees dermatology only as needed- did have skin biopsy in November. advised regular sunscreen use - mainly stays out of sun. Denies worrisome, changing, or new skin lesions.  7. Smoking associated screening  (lung cancer screening, AAA screen 65-75, UA)-former smoker-quit over 50 years ago-no regular screening required 8. STD screening - not dating after loss of wife- but does get a lot of attention  Status of chronic or acute concerns   #social update- lost his wife in December 2023- had just celebrated 746years. She had been on hospice.  - aide from 9-5 pm - is dressed by time she gets there but she orders dinner and leaves pills out- he's in bed by 8 - [facility]- considering [facility]F -occasional incontinence  #Chronic atrial fibrillation ([place] appropriately anticoagulated with Eliquis 2.5 mg twice daily and rate controlled without medication- continue current medicine   #CKD (chronic kidney disease), stage IV (HCC) Continue to follow closely with chronic kidney-reports recently had labs and was told kidneys were slightly worsened up to 2.71 on creatinine when prior mainly only to 2.5 but plan was still 4 month follow up   Chronic heart failure with preserved ejection fraction (HCC) Heart failure noted-has been stable on Lasix 40 mg- was increased to 60 mg by Dr. BCarolin Sicksshort term but was hard with pill packs and fluid went back down. At present no edema or shortness of breath  Hemiparesis affecting right side as late effect of cerebrovascular accident ([facility] Noted-continue risk factor modification to prevent subsequent stroke  Major depression in remission ([place] Suspect reasonably stable on escitalopram 20 mg but difficult to fully assess due to garbled speech after stroke- but hard to tell- does sleep more  Thrombocytopenia ([place] Intermittently mildly low-update CBC with labs today-anemia likely related to chronic kidney disease- [facility] without thrombocyt[facility] Lab Results  Component Value Date   WBC 5.3 05/10/2021   HGB 10.5 (L) 05/10/2021   HCT 31.0 (L) 05/10/2021   MCV 96.8 05/10/2021   PLT 165.0 05/10/2021   Coronary  artery disease involving native coronary artery of native heart without angina pectoris Remains on Eliquis (not on aspirin as a result) as well as rosuvastatin - No obvious chest pain or shortness of breath reported   Hypertension Patient remains on amlodipine 2.5 mg, Lasix 40 mg daily -Has been on Maxide in  the past before Lasix BP Readings from Last 3 Encounters:  04/17/22 (!) 152/62  01/17/22 (!) 186/74  12/20/21 138/68   - blood pressure slightly high today but was recently 138/60 [facility] and [facility] at wound center on repeat- they will let me know if runs high at wound center on follow up visits- I was hesitant to increase dose with him living alone and potential to make him lightheaded/cause falls  Hyperlipidemia Cholesterol has been well-controlled-due for repeat at this time-for now continue current medication- rosuvastatin 20 mg Lab Results  Component Value Date   CHOL 131 01/27/2021   HDL 65.60 01/27/2021   LDLCALC 52 01/27/2021   LDLDIRECT 66.0 06/01/2016   TRIG 69.0 01/27/2021   CHOLHDL 2 01/27/2021    BPH with obstruction/lower urinary tract symptoms Reasonable control on tamsulosin 0.4 mg daily  Memory loss Long-term issue-has been on memantine since before I was his doctor  GERD Well-controlled on lansoprazole over-the-counter  Recommended follow up: Return in about 6 months (around 10/16/2022) for followup or sooner if needed.Schedule b4 you leave. Future Appointments  Date Time Provider [place]  04/18/2022  1:30 PM Valinda Party, [place] [facility]  04/25/2022  2:00 PM Valinda Party, DO [facility]   Lab/Order associations:NOT fasting   ICD-10-CM   1. Preventative health care  Z00.00 tamsulosin (FLOMAX) 0.4 MG CAPS capsule    2. Chronic atrial fibrillation (HCC)  I48.20     3. CKD (chronic kidney disease), stage IV (HCC)  N18.4     4. Chronic heart failure with preserved ejection fraction (HCC)  I50.32      5. Hemiparesis affecting right side as late effect of cerebrovascular accident ([place])  I69.351     6. Major depression in remission ([place])  F32.5     7. Thrombocytopenia ([place])  D69.6     8. Coronary artery disease involving native coronary artery of native heart without angina pectoris  I25.10     9. Hyperlipidemia, unspecified hyperlipidemia type  E78.5 Comprehensive metabolic panel    Lipid panel    10. BPH with obstruction/lower urinary tract symptoms  N40.1    N13.8     11. Memory loss  R41.3       Meds ordered this encounter  Medications   amLODipine (NORVASC) 2.5 MG tablet    Sig: TAKE 1 TABLET BY MOUTH ONCE DAILY    Dispense:  90 tablet    Refill:  3    Please send script with year supply of refills if appropriate   memantine (NAMENDA) 10 MG tablet    Sig: Take 1 tablet (10 mg total) by mouth daily.    Dispense:  90 tablet    Refill:  3    please send year supply if appropriate   tamsulosin (FLOMAX) 0.4 MG CAPS capsule    Sig: TAKE 1 CAPSULE BY MOUTH EVERY NIGHT AT BEDTIME *DO NOT CRUSH OR CHEW* **DO NOT OPEN CAPSULE**    Dispense:  90 capsule    Refill:  3    Return precautions advised.  [name], MD

## 2022-04-18 ENCOUNTER — Encounter (HOSPITAL_BASED_OUTPATIENT_CLINIC_OR_DEPARTMENT_OTHER): Payer: Medicare Other | Admitting: Internal Medicine

## 2022-04-18 ENCOUNTER — Telehealth: Payer: Self-pay | Admitting: Family Medicine

## 2022-04-18 DIAGNOSIS — I87311 Chronic venous hypertension (idiopathic) with ulcer of right lower extremity: Secondary | ICD-10-CM | POA: Diagnosis not present

## 2022-04-18 DIAGNOSIS — Z7901 Long term (current) use of anticoagulants: Secondary | ICD-10-CM | POA: Diagnosis not present

## 2022-04-18 DIAGNOSIS — I69351 Hemiplegia and hemiparesis following cerebral infarction affecting right dominant side: Secondary | ICD-10-CM | POA: Diagnosis not present

## 2022-04-18 DIAGNOSIS — I482 Chronic atrial fibrillation, unspecified: Secondary | ICD-10-CM | POA: Diagnosis not present

## 2022-04-18 DIAGNOSIS — S81801A Unspecified open wound, right lower leg, initial encounter: Secondary | ICD-10-CM | POA: Diagnosis not present

## 2022-04-18 DIAGNOSIS — Z09 Encounter for follow-up examination after completed treatment for conditions other than malignant neoplasm: Secondary | ICD-10-CM | POA: Diagnosis not present

## 2022-04-18 DIAGNOSIS — X58XXXA Exposure to other specified factors, initial encounter: Secondary | ICD-10-CM | POA: Diagnosis not present

## 2022-04-18 LAB — COMPREHENSIVE METABOLIC PANEL
ALT: 24 U/L (ref 0–53)
AST: 22 U/L (ref 0–37)
Albumin: 3.9 g/dL (ref 3.5–5.2)
Alkaline Phosphatase: 128 U/L — ABNORMAL HIGH (ref 39–117)
BUN: 41 mg/dL — ABNORMAL HIGH (ref 6–23)
CO2: 25 mEq/L (ref 19–32)
Calcium: 8.3 mg/dL — ABNORMAL LOW (ref 8.4–10.5)
Chloride: 106 mEq/L (ref 96–112)
Creatinine, Ser: 2.85 mg/dL — ABNORMAL HIGH (ref 0.40–1.50)
GFR: 18.73 mL/min — ABNORMAL LOW (ref >60.00)
Glucose, Bld: 118 mg/dL — ABNORMAL HIGH (ref 70–99)
Potassium: 4.5 mEq/L (ref 3.5–5.1)
Sodium: 140 mEq/L (ref 135–145)
Total Bilirubin: 0.4 mg/dL (ref 0.2–1.2)
Total Protein: 6.7 g/dL (ref 6.0–8.3)

## 2022-04-18 LAB — LIPID PANEL
Cholesterol: 128 mg/dL (ref 0–200)
HDL: 57.5 mg/dL (ref >39.00)
LDL Cholesterol: 52 mg/dL (ref 0–99)
NonHDL: 70.88
Total CHOL/HDL Ratio: 2
Triglycerides: 92 mg/dL (ref 0.0–149.0)
VLDL: 18.4 mg/dL (ref 0.0–40.0)

## 2022-04-18 NOTE — Telephone Encounter (Signed)
Daughter calling back re BP -   Went to wound center today upon arrival BP was 198/88 - discharge Bp  170/70 with pulse of 49. No further symptoms have been noticed per daughter/ Stated patient is in no pain.   Daughter would like to know further steps or recommendations.

## 2022-04-18 NOTE — Telephone Encounter (Signed)
FYI

## 2022-04-18 NOTE — Telephone Encounter (Signed)
Team please increase amlodipine to 5 mg daily #90 with 3 refills and inform daughter who can inform patient.  Have him update me in 1-2 weeks with home readings.  This could increase swelling which is not ideal but we need to get pressure down

## 2022-04-19 MED ORDER — AMLODIPINE BESYLATE 5 MG PO TABS: ORAL_TABLET | ORAL | 3 refills | Status: DC

## 2022-04-19 NOTE — Telephone Encounter (Signed)
Called and spoke with pt daughter and below message given.

## 2022-04-19 NOTE — Progress Notes (Signed)
[name], [name] ([id number]) [id number] Page 1 of 10 Visit Report for 04/18/2022 Arrival Information Details Patient Name: Date of Service: [name] 04/18/2022 1:30 PM Medical Record Number: [id number] Patient Account Number: [id number] Date of Birth/Sex: Treating RN: May 12, 1928 (89 y.o. [name] Primary Care Shellee Streng: [name] Other Clinician: Referring Genesis Paget: Treating Dang Mathison/Extender: Nelva Bush in Treatment: 2 Visit Information History Since Last Visit Added or deleted any medications: No Patient Arrived: [name] Any new allergies or adverse reactions: No Arrival Time: 13:45 Had a fall or experienced change in No Accompanied By: daughter activities of daily living that may affect Transfer Assistance: None risk of falls: Patient Identification Verified: Yes Signs or symptoms of abuse/neglect since last visito No Secondary Verification Process Completed: Yes Hospitalized since last visit: No Patient Requires Transmission-Based Precautions: No Implantable device outside of the clinic excluding No Patient Has Alerts: Yes cellular tissue based products placed in the center Patient Alerts: Patient on Blood Thinner since last visit: Has Dressing in Place as Prescribed: No Has Compression in Place as Prescribed: No Pain Present Now: No Notes Dressing off due to getting soiled Electronic Signature(s) Signed: 04/18/2022 4:38:15 PM By: Erenest Blank Entered By: Erenest Blank on 04/18/2022 13:46:16 -------------------------------------------------------------------------------- Clinic Level of Care Assessment Details Patient Name: Date of Service: [name], [name] 04/18/2022 1:30 PM Medical Record Number: [id number] Patient Account Number: [id number] Date of Birth/Sex: Treating RN: Apr 10, 1928 (89 y.o. [name] Primary Care Devone Tousley: [name] Other Clinician: Referring Donterrius Santucci: Treating  Theadore Blunck/Extender: Nelva Bush in Treatment: 2 Clinic Level of Care Assessment Items TOOL 4 Quantity Score X- 1 0 Use when only an EandM is performed on FOLLOW-UP visit ASSESSMENTS - Nursing Assessment / Reassessment X- 1 10 Reassessment of Co-morbidities (includes updates in patient status) X- 1 5 Reassessment of Adherence to Treatment Plan ASSESSMENTS - Wound and Skin A ssessment / Reassessment []$  - 0 Simple Wound Assessment / Reassessment - one wound X- 2 5 Complex Wound Assessment / Reassessment - multiple wounds X- 1 10 Dermatologic / Skin Assessment (not related to wound area) ASSESSMENTS - Focused Assessment [name] ([id number]) [phone].pdf Page 2 of 10 X- 1 5 Circumferential Edema Measurements - multi extremities []$  - 0 Nutritional Assessment / Counseling / Intervention []$  - 0 Lower Extremity Assessment (monofilament, tuning fork, pulses) []$  - 0 Peripheral Arterial Disease Assessment (using hand held doppler) ASSESSMENTS - Ostomy and/or Continence Assessment and Care []$  - 0 Incontinence Assessment and Management []$  - 0 Ostomy Care Assessment and Management (repouching, etc.) PROCESS - Coordination of Care []$  - 0 Simple Patient / Family Education for ongoing care X- 1 20 Complex (extensive) Patient / Family Education for ongoing care X- 1 10 Staff obtains Programmer, systems, Records, T Results / Process Orders est []$  - 0 Staff telephones HHA, Nursing Homes / Clarify orders / etc []$  - 0 Routine Transfer to another Facility (non-emergent condition) []$  - 0 Routine Hospital Admission (non-emergent condition) []$  - 0 New Admissions / Biomedical engineer / Ordering NPWT Apligraf, etc. , []$  - 0 Emergency Hospital Admission (emergent condition) []$  - 0 Simple Discharge Coordination X- 1 15 Complex (extensive) Discharge Coordination PROCESS - Special Needs []$  - 0 Pediatric / Minor Patient Management []$  -  0 Isolation Patient Management []$  - 0 Hearing / Language / Visual special needs []$  - 0 Assessment of Community assistance (transportation, D/C planning, etc.) []$  - 0 Additional assistance / Altered mentation []$  - 0 Support Surface(s)  Assessment (bed, cushion, seat, etc.) INTERVENTIONS - Wound Cleansing / Measurement []$  - 0 Simple Wound Cleansing - one wound X- 2 5 Complex Wound Cleansing - multiple wounds X- 1 5 Wound Imaging (photographs - any number of wounds) []$  - 0 Wound Tracing (instead of photographs) []$  - 0 Simple Wound Measurement - one wound X- 2 5 Complex Wound Measurement - multiple wounds INTERVENTIONS - Wound Dressings []$  - 0 Small Wound Dressing one or multiple wounds []$  - 0 Medium Wound Dressing one or multiple wounds []$  - 0 Large Wound Dressing one or multiple wounds []$  - 0 Application of Medications - topical []$  - 0 Application of Medications - injection INTERVENTIONS - Miscellaneous []$  - 0 External ear exam []$  - 0 Specimen Collection (cultures, biopsies, blood, body fluids, etc.) []$  - 0 Specimen(s) / Culture(s) sent or taken to Lab for analysis []$  - 0 Patient Transfer (multiple staff / Harrel Lemon Lift / Similar devices) []$  - 0 Simple Staple / Suture removal (25 or less) [name], [name] ([id number]) [phone] Page 3 of 10 []$  - 0 Complex Staple / Suture removal (26 or more) []$  - 0 Hypo / Hyperglycemic Management (close monitor of Blood Glucose) []$  - 0 Ankle / Brachial Index (ABI) - do not check if billed separately X- 1 5 Vital Signs Has the patient been seen at the hospital within the last three years: Yes Total Score: 115 Level Of Care: New/Established - Level 3 Electronic Signature(s) Signed: 04/18/2022 4:50:49 PM By: Deon Pilling RN, BSN Entered By: Deon Pilling on 04/18/2022 14:12:50 -------------------------------------------------------------------------------- Encounter Discharge Information Details Patient  Name: Date of Service: [name] 04/18/2022 1:30 PM Medical Record Number: [id number] Patient Account Number: [id number] Date of Birth/Sex: Treating RN: Sep 13, 1928 (89 y.o. [name] Primary Care Mekisha Bittel: [name] Other Clinician: Referring Penelopi Mikrut: Treating Kalub Morillo/Extender: Nelva Bush in Treatment: 2 Encounter Discharge Information Items Discharge Condition: Stable Ambulatory Status: Walker Discharge Destination: Home Transportation: Private Auto Accompanied By: daughter Schedule Follow-up Appointment: No Clinical Summary of Care: Electronic Signature(s) Signed: 04/18/2022 4:50:49 PM By: Deon Pilling RN, BSN Entered By: Deon Pilling on 04/18/2022 14:13:18 -------------------------------------------------------------------------------- Lower Extremity Assessment Details Patient Name: Date of Service: [name], [name] 04/18/2022 1:30 PM Medical Record Number: [id number] Patient Account Number: [id number] Date of Birth/Sex: Treating RN: [birth year] (89 y.o. [name] Primary Care Collins Dimaria: [name] Other Clinician: Referring Argil Mahl: Treating Ayodele Sangalang/Extender: Nelva Bush in Treatment: 2 Edema Assessment Assessed: Shirlyn Goltz: No] [Right: No] Edema: [Left: Ye] [Right: s] Calf Left: Right: Point of Measurement: 36 cm From Medial Instep 35.2 cm Ankle Left: Right: Point of Measurement: 10 cm From Medial Instep 26 cm RENSO, MICHEAU [name] ([id number]) [phone] Page 4 of 10 Vascular Assessment Pulses: Dorsalis Pedis Palpable: [Right:Yes] Electronic Signature(s) Signed: 04/18/2022 4:38:15 PM By: Erenest Blank Signed: 04/18/2022 4:50:49 PM By: Deon Pilling RN, BSN Entered By: Erenest Blank on 04/18/2022 13:55:26 -------------------------------------------------------------------------------- Multi Wound Chart Details Patient Name: Date of Service: [name], [name]  04/18/2022 1:30 PM Medical Record Number: [id number] Patient Account Number: [id number] Date of Birth/Sex: Treating RN: 05/01/1928 (89 y.o. [name] Primary Care Ligia Duguay: [name] Other Clinician: Referring Mikelle Myrick: Treating Ky Rumple/Extender: Nelva Bush in Treatment: 2 Vital Signs Height(in): 73 Pulse(bpm): 49 Weight(lbs): 185 Blood Pressure(mmHg): 170/70 Body Mass Index(BMI): 24.4 Temperature(F): 97.8 Respiratory Rate(breaths/min): 18 [4:Photos:] [N/A:N/A] Right, Lateral Lower Leg Right, Medial Lower Leg N/A Wound Location: Blister Gradually Appeared N/A Wounding Event: Venous Leg Ulcer  Venous Leg Ulcer N/A Primary Etiology: Lymphedema Lymphedema N/A Secondary Etiology: Anemia, Arrhythmia, Coronary Artery Anemia, Arrhythmia, Coronary Artery N/A Comorbid History: Disease, Hypertension, End Stage Disease, Hypertension, End Stage Renal Disease Renal Disease 03/27/2022 03/27/2022 N/A Date Acquired: 2 2 N/A Weeks of Treatment: Healed - Epithelialized Healed - Epithelialized N/A Wound Status: No No N/A Wound Recurrence: Yes Yes N/A Clustered Wound: 2 2 N/A Clustered Quantity: 0x0x0 0x0x0 N/A Measurements L x W x D (cm) 0 0 N/A A (cm) : rea 0 0 N/A Volume (cm) : 100.00% 100.00% N/A % Reduction in Area: 100.00% 100.00% N/A % Reduction in Volume: Full Thickness Without Exposed Full Thickness Without Exposed N/A Classification: Support Structures Support Structures Medium Medium N/A Exudate Amount: Serosanguineous Serosanguineous N/A Exudate Type: red, brown red, brown N/A Exudate Color: Distinct, outline attached Distinct, outline attached N/A Wound Margin: Large (67-100%) Large (67-100%) N/A Granulation Amount: Red Red N/A Granulation Quality: None Present (0%) None Present (0%) N/A Necrotic Amount: Fat Layer (Subcutaneous Tissue): Yes Fat Layer (Subcutaneous Tissue): Yes N/A Exposed Structures: Fascia:  No Fascia: No Tendon: No Tendon: No [name], BIRKHEAD ([id number]) [phone].pdf Page 5 of 10 Muscle: No Muscle: No Joint: No Joint: No Bone: No Bone: No Large (67-100%) Large (67-100%) N/A Epithelialization: Excoriation: No Excoriation: No N/A Periwound Skin Texture: Induration: No Induration: No Callus: No Callus: No Crepitus: No Crepitus: No Rash: No Rash: No Scarring: No Scarring: No Dry/Scaly: Yes Dry/Scaly: Yes N/A Periwound Skin Moisture: Maceration: No Maceration: No Hemosiderin Staining: Yes Hemosiderin Staining: Yes N/A Periwound Skin Color: Atrophie Blanche: No Atrophie Blanche: No Cyanosis: No Cyanosis: No Ecchymosis: No Ecchymosis: No Erythema: No Erythema: No Mottled: No Mottled: No Pallor: No Pallor: No Rubor: No Rubor: No Treatment Notes Wound #4 (Lower Leg) Wound Laterality: Right, Lateral Cleanser Peri-Wound Care Topical Primary Dressing Secondary Dressing Secured With Compression Wrap Compression Stockings Add-Ons Wound #5 (Lower Leg) Wound Laterality: Right, Medial Cleanser Peri-Wound Care Topical Primary Dressing Secondary Dressing Secured With Compression Wrap Compression Stockings Add-Ons Electronic Signature(s) Signed: 04/18/2022 3:41:53 PM By: Kalman Shan DO Signed: 04/18/2022 4:50:49 PM By: Deon Pilling RN, BSN Entered By: Kalman Shan on 04/18/2022 14:49:51 -------------------------------------------------------------------------------- Multi-Disciplinary Care Plan Details Patient Name: Date of Service: [name]. 04/18/2022 1:30 PM Medical Record Number: [id number] Patient Account Number: [id number] Date of Birth/Sex: Treating RN: January 28, 1929 (89 y.o. [name] Primary Care Carl Bleecker: [name] Other Clinician: [name] ([id number]) [id number] Page 6 of 10 Referring Makyra Corprew: Treating Estreya Clay/Extender: Nelva Bush in Treatment: 2 Active Inactive Electronic Signature(s) Signed: 04/18/2022 4:50:49 PM By: Deon Pilling RN, BSN Entered By: Deon Pilling on 04/18/2022 16:08:06 -------------------------------------------------------------------------------- Pain Assessment Details Patient Name: Date of Service: [name], [name] 04/18/2022 1:30 PM Medical Record Number: [id number] Patient Account Number: [id number] Date of Birth/Sex: Treating RN: [birth year] (89 y.o. [name] Primary Care Carlin Mamone: [name] Other Clinician: Referring Wyett Narine: Treating Zoraya Fiorenza/Extender: Nelva Bush in Treatment: 2 Active Problems Location of Pain Severity and Description of Pain Patient Has Paino No Site Locations Pain Management and Medication Current Pain Management: Electronic Signature(s) Signed: 04/18/2022 4:38:15 PM By: Erenest Blank Signed: 04/18/2022 4:50:49 PM By: Deon Pilling RN, BSN Entered By: Erenest Blank on 04/18/2022 13:50:45 -------------------------------------------------------------------------------- Patient/Caregiver Education Details Patient Name: Date of Service: [name] 2/20/2024andnbsp1:30 PM Medical Record Number: [id number] Patient Account Number: [id number] Date of Birth/Gender: Treating RN: 1928-06-26 (89 y.o. [name] Primary Care Physician: [name] Other Clinician: Arneta Cliche  [name] ([id number] Page 7 of 10 Referring Physician: Treating Physician/Extender: Nelva Bush in Treatment: 2 Education Assessment Education Provided To: Patient Education Topics Provided Wound/Skin Impairment: Handouts: Caring for Your Ulcer Methods: Explain/Verbal Responses: Reinforcements needed Electronic Signature(s) Signed: 04/18/2022 4:50:49 PM By: Deon Pilling RN, BSN Entered By: Deon Pilling on 04/18/2022  13:49:00 -------------------------------------------------------------------------------- Wound Assessment Details Patient Name: Date of Service: [name] 04/18/2022 1:30 PM Medical Record Number: [id number] Patient Account Number: [id number] Date of Birth/Sex: Treating RN: 04-08-1928 (89 y.o. Lorette Ang, Meta.Reding Primary Care Stony Stegmann: [name] Other Clinician: Referring Coralynn Gaona: Treating Oceane Fosse/Extender: Nelva Bush in Treatment: 2 Wound Status Wound Number: 4 Primary Venous Leg Ulcer Etiology: Wound Location: Right, Lateral Lower Leg Secondary Lymphedema Wounding Event: Blister Etiology: Date Acquired: 03/27/2022 Wound Healed - Epithelialized Weeks Of Treatment: 2 Status: Clustered Wound: Yes Comorbid Anemia, Arrhythmia, Coronary Artery Disease, Hypertension, History: End Stage Renal Disease Photos Wound Measurements Length: (cm) Width: (cm) Depth: (cm) Clustered Quantity: Area: (cm) Volume: (cm) 0 % Reduction in Area: 100% 0 % Reduction in Volume: 100% 0 Epithelialization: Large (67-100%) 2 Tunneling: No 0 Undermining: No 0 Wound Description Classification: Full Thickness Without Exposed Support Structures Wound Margin: Distinct, outline attached Exudate Amount: Medium Exudate Type: Serosanguineous Hamill, Masashi [name] ([id number]) Exudate Color: red, brown Foul Odor After Cleansing: No Slough/Fibrino Yes [phone].pdf Page 8 of 10 Wound Bed Granulation Amount: Large (67-100%) Exposed Structure Granulation Quality: Red Fascia Exposed: No Necrotic Amount: None Present (0%) Fat Layer (Subcutaneous Tissue) Exposed: Yes Tendon Exposed: No Muscle Exposed: No Joint Exposed: No Bone Exposed: No Periwound Skin Texture Texture Color No Abnormalities Noted: No No Abnormalities Noted: No Callus: No Atrophie Blanche: No Crepitus: No Cyanosis: No Excoriation: No Ecchymosis: No Induration:  No Erythema: No Rash: No Hemosiderin Staining: Yes Scarring: No Mottled: No Pallor: No Moisture Rubor: No No Abnormalities Noted: No Dry / Scaly: Yes Maceration: No Treatment Notes Wound #4 (Lower Leg) Wound Laterality: Right, Lateral Cleanser Peri-Wound Care Topical Primary Dressing Secondary Dressing Secured With Compression Wrap Compression Stockings Add-Ons Electronic Signature(s) Signed: 04/18/2022 4:50:49 PM By: Deon Pilling RN, BSN Entered By: Deon Pilling on 04/18/2022 14:10:34 -------------------------------------------------------------------------------- Wound Assessment Details Patient Name: Date of Service: [name] 04/18/2022 1:30 PM Medical Record Number: [id number] Patient Account Number: [id number] Date of Birth/Sex: Treating RN: Nov 03, 1928 (89 y.o. [name] Primary Care Sequoia Witz: [name] Other Clinician: Referring Stephens Shreve: Treating Cleophas Yoak/Extender: Nelva Bush in Treatment: 2 Wound Status Wound Number: 5 Primary Venous Leg Ulcer Etiology: Wound Location: Right, Medial Lower Leg Secondary Lymphedema Wounding Event: Gradually Appeared Etiology: Date Acquired: 03/27/2022 Wound Healed - Epithelialized Weeks Of Treatment: 2 Status: Clustered Wound: Yes Comorbid Anemia, Arrhythmia, Coronary Artery Disease, Hypertension, History: End Stage Renal Disease VONTRELL, STENDAHL ([id number]) [phone].pdf Page 9 of 10 Photos Wound Measurements Length: (cm) Width: (cm) Depth: (cm) Clustered Quantity: Area: (cm) Volume: (cm) 0 % Reduction in Area: 100% 0 % Reduction in Volume: 100% 0 Epithelialization: Large (67-100%) 2 Tunneling: No 0 Undermining: No 0 Wound Description Classification: Full Thickness Without Exposed Sup Wound Margin: Distinct, outline attached Exudate Amount: Medium Exudate Type: Serosanguineous Exudate Color: red, brown port Structures Foul Odor After  Cleansing: No Slough/Fibrino Yes Wound Bed Granulation Amount: Large (67-100%) Exposed Structure Granulation Quality: Red Fascia Exposed: No Necrotic Amount: None Present (0%) Fat Layer (Subcutaneous Tissue) Exposed: Yes Tendon Exposed: No Muscle Exposed: No Joint Exposed: No Bone Exposed: No Periwound Skin Texture Texture  Color No Abnormalities Noted: No No Abnormalities Noted: No Callus: No Atrophie Blanche: No Crepitus: No Cyanosis: No Excoriation: No Ecchymosis: No Induration: No Erythema: No Rash: No Hemosiderin Staining: Yes Scarring: No Mottled: No Pallor: No Moisture Rubor: No No Abnormalities Noted: No Dry / Scaly: Yes Maceration: No Treatment Notes Wound #5 (Lower Leg) Wound Laterality: Right, Medial Cleanser Peri-Wound Care Topical Primary Dressing Secondary Dressing Secured With Compression Wrap Compression Stockings Add-Ons Electronic Signature(s) Signed: 04/18/2022 4:50:49 PM By: Deon Pilling RN, BSN [place], CHARLES2/20/2024 4:50:49 PM By: Deon Pilling RN, BSN SignedClayton Bibles ([id number] Page 10 of 10 Entered By: Deon Pilling on 04/18/2022 14:10:34 -------------------------------------------------------------------------------- Vitals Details Patient Name: Date of Service: SHOAIB, BARGERON 04/18/2022 1:30 PM Medical Record Number: [id number] Patient Account Number: [id number] Date of Birth/Sex: Treating RN: 05/22/1928 (89 y.o. [name] Primary Care Shelly Spenser: [name] Other Clinician: Referring Dejean Tribby: Treating Criselda Starke/Extender: Nelva Bush in Treatment: 2 Vital Signs Time Taken: 13:46 Temperature (F): 97.8 Height (in): 73 Pulse (bpm): 49 Weight (lbs): 185 Respiratory Rate (breaths/min): 18 Body Mass Index (BMI): 24.4 Blood Pressure (mmHg): 170/70 Reference Range: 80 - 120 mg / dl Electronic Signature(s) Signed: 04/18/2022 4:38:15 PM By: Erenest Blank Entered By: Erenest Blank on 04/18/2022 14:00:01

## 2022-04-19 NOTE — Progress Notes (Signed)
MIAN, KOLM ([id number]) [id number] Page 1 of 8 Visit Report for 04/18/2022 Chief Complaint Document Details Patient Name: Date of Service: [name], [name] 04/18/2022 1:30 PM Medical Record Number: [id number] Patient Account Number: [id number] Date of Birth/Sex: Treating RN: 1928/06/14 (89 y.o. [name] Primary Care Provider: Garret [name] Other Clinician: Referring Provider: Treating Provider/Extender: [name] in Treatment: 2 Information Obtained from: Patient Chief Complaint 04/03/2022; right lower extremity wounds Electronic Signature(s) Signed: 04/18/2022 3:41:53 PM By: [name] Shan DO Entered By: [name] on 04/18/2022 14:50:07 -------------------------------------------------------------------------------- HPI Details Patient Name: Date of Service: [name] 04/18/2022 1:30 PM Medical Record Number: [id number] Patient Account Number: [id number] Date of Birth/Sex: Treating RN: February 25, 1929 (89 y.o. [name] Primary Care Provider: Garret [name] Other Clinician: Referring Provider: Treating Provider/Extender: [name] in Treatment: 2 History of Present Illness HPI Description: 11/10/2020 upon evaluation today patient presents for initial evaluation and inspection here in the clinic concerning a wound that he has over the right lower extremity. He has been tolerating the dressing changes currently at home they have been using just more antibiotic ointment and what they could in general in that regard. With that being said the patient does have a history of having frequent issues with this over the past 2 years intermittently. He has chronic venous insufficiency, lymphedema, coronary artery disease, hypertension, paroxysmal atrial fibrillation, long-term use of anticoagulants due to his history of stroke as well where he does have residual right-sided weakness  following the cerebrovascular accident. He also has previously had veins taken from his right leg to provide a harvest material for the coronary artery bypass surgery. Subsequently this is also the leg that seems to swell the most understandably. 11/17/2020 upon evaluation today patient appears to be doing well with regard to his right leg. He is very close to complete resolution here. Unfortunately he has some areas on his left leg which have arisen over the past week. I think this is due to his swelling and I think if we get the swelling under control this will be significantly improved in general. At this point discussed with patient and his daughter today. 11/24/2020 upon evaluation today patient appears to be doing excellent and in fact is completely healed based on what I am seeing today. Unfortunately we have had an issue with getting his juxta light compression although that has been ordered apparently there was a complication with the order. We will get have to follow-up on that. 04/03/2022 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 He was seen in our clinic 1-2 years ago for a similar issue. He has Velcro compression stockings however states he does not want to wear them. He has been using antibiotic ointment and keeping the area covered. He currently denies signs of infection including increased warmth, erythema or purulent drainage. ABI in office was 0.89. 04/10/22 Patient presents for follow-up. We have been using Hydrofera Blue with antibiotic ointment under 2 layer Coflex to the right lower extremity. He has had improvement in wound healing. He has compression stockings at home. 2/20; patient presents for follow-up. We have been using Xeroform under 3 layer compression  to the right lower extremity. His wounds of healed. Electronic Signature(s) [name] ([id number]) [id number]  Page 2 of 8 Signed: 04/18/2022 3:41:53 PM By: [name] Shan DO Entered By: [name] on 04/18/2022 14:50:44 -------------------------------------------------------------------------------- Physical Exam Details Patient Name: Date of Service: [name], [name] 04/18/2022 1:30 PM Medical Record Number: [id number] Patient Account Number: [id number] Date of Birth/Sex: Treating RN: [birth year] (89 y.o. [name] Primary Care Provider: Garret [name] Other Clinician: Referring Provider: Treating Provider/Extender: [name] in Treatment: 2 Constitutional respirations regular, non-labored and within target range for patient.. Cardiovascular 2+ dorsalis pedis/posterior tibialis pulses. Psychiatric pleasant and cooperative. Notes Epithelization to the previous wound sites to the right lower extremity. Venous stasis dermatitis. 2+ pitting edema to the knee. Electronic Signature(s) Signed: 04/18/2022 3:41:53 PM By: [name] Shan DO Entered By: [name] on 04/18/2022 14:51:50 -------------------------------------------------------------------------------- Physician Orders Details Patient Name: Date of Service: [name], [name] 04/18/2022 1:30 PM Medical Record Number: [id number] Patient Account Number: [id number] Date of Birth/Sex: Treating RN: March 16, 1928 (89 y.o. [name], Meta.Reding Primary Care Provider: Garret [name] Other Clinician: Referring Provider: Treating Provider/Extender: [name] in Treatment: 2 Verbal / Phone Orders: No Diagnosis Coding ICD-10 Coding Code Description [phone] Unspecified open wound, right lower leg, initial encounter I87.311 Chronic venous hypertension (idiopathic) with ulcer of right lower extremity I48.20 Chronic atrial  fibrillation, unspecified Z79.01 Long term (current) use of anticoagulants I69.351 Hemiplegia and hemiparesis following cerebral infarction affecting right dominant side Discharge From [facility] Services Discharge from [facility] Edema Control - Lymphedema / SCD / Other Elevate legs to the level of the heart or above for 30 minutes daily and/or when sitting for 3-4 times a day throughout the day. Avoid standing for long periods of time. Patient to wear own compression stockings every day. Exercise regularly Moisturize legs daily. - every night before bed. Compression stocking or Garment 20-30 mm/Hg pressure to: - apply in the morning and remove at night. [name], [name] ([id number]) [id number] Page 3 of 8 Electronic Signature(s) Signed: 04/18/2022 3:41:53 PM By: [name] Shan DO Entered By: [name] on 04/18/2022 14:51:57 -------------------------------------------------------------------------------- Problem List Details Patient Name: Date of Service: [name], [name] 04/18/2022 1:30 PM Medical Record Number: [id number] Patient Account Number: [id number] Date of Birth/Sex: Treating RN: April 28, 1928 (89 y.o. [name] Primary Care Provider: Garret [name] Other Clinician: Referring Provider: Treating Provider/Extender: [name] in Treatment: 2 Active Problems ICD-10 Encounter Code Description Active Date MDM Diagnosis S81.801A Unspecified open wound, right lower leg, initial encounter 04/03/2022 No Yes I87.311 Chronic venous hypertension (idiopathic) with ulcer of right lower extremity 04/03/2022 No Yes I48.20 Chronic atrial fibrillation, unspecified 04/03/2022 No Yes Z79.01 Long term (current) use of anticoagulants 04/03/2022 No Yes I69.351 Hemiplegia and hemiparesis following cerebral infarction affecting right 04/03/2022 No Yes dominant side Inactive Problems Resolved Problems Electronic Signature(s) Signed:  04/18/2022 3:41:53 PM By: [name] Shan DO Entered By: [name] on 04/18/2022 14:49:43 -------------------------------------------------------------------------------- Progress Note Details Patient Name: Date of Service: [name] 04/18/2022 1:30 PM Medical Record Number: [id number] Patient Account Number: [id number] Date of Birth/Sex: Treating RN: [birth year] (89 y.o. [name] Primary Care Provider: Garret [name] Other Clinician: Referring Provider: Treating Provider/Extender: [name] in Treatment: 2 [name], [name] ([id number]) [id number] Page 4 of 8 Subjective Chief Complaint Information obtained from Patient 04/03/2022; right lower extremity wounds History of Present Illness (HPI) 11/10/2020 upon evaluation today patient presents for initial evaluation and inspection here in the clinic concerning a wound that he has over the right lower extremity.  He has been tolerating the dressing changes currently at home they have been using just more antibiotic ointment and what they could in general in that regard. With that being said the patient does have a history of having frequent issues with this over the past 2 years intermittently. He has chronic venous insufficiency, lymphedema, coronary artery disease, hypertension, paroxysmal atrial fibrillation, long-term use of anticoagulants due to his history of stroke as well where he does have residual right-sided weakness following the cerebrovascular accident. He also has previously had veins taken from his right leg to provide a harvest material for the coronary artery bypass surgery. Subsequently this is also the leg that seems to swell the most understandably. 11/17/2020 upon evaluation today patient appears to be doing well with regard to his right leg. He is very close to complete resolution here. Unfortunately he has some areas on his left leg which have arisen  over the past week. I think this is due to his swelling and I think if we get the swelling under control this will be significantly improved in general. At this point discussed with patient and his daughter today. 11/24/2020 upon evaluation today patient appears to be doing excellent and in fact is completely healed based on what I am seeing today. Unfortunately we have had an issue with getting his juxta light compression although that has been ordered apparently there was a complication with the order. We will get have to follow-up on that. 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 He was seen in our clinic 1-2 years ago for a similar issue. He has Velcro compression stockings however states he does not want to wear them. He has been using antibiotic ointment and keeping the area covered. He currently denies signs of infection including increased warmth, erythema or purulent drainage. ABI in office was 0.89. 04/10/22 Patient presents for follow-up. We have been using Hydrofera Blue with antibiotic ointment under 2 layer Coflex to the right lower extremity. He has had improvement in wound healing. He has compression stockings at home. 2/20; patient presents for follow-up. We have been using Xeroform under 3 layer compression to the right lower extremity. His wounds of healed. Patient History Family History Diabetes - Siblings, Heart Disease - Mother, Hypertension - Mother, Kidney Disease - Siblings, Stroke - Mother, No family history of Cancer, Hereditary Spherocytosis, Lung Disease, Seizures, Thyroid Problems, Tuberculosis. Social History Former smoker - 23 years ago, Marital Status - Widowed, Alcohol Use - Never, Drug Use - No History, Caffeine Use - Never. Medical  History Eyes Denies history of Cataracts, Glaucoma, Optic Neuritis Ear/Nose/Mouth/Throat Denies history of Chronic sinus problems/congestion, Middle ear problems Hematologic/Lymphatic Patient has history of Anemia Denies history of Hemophilia, Human Immunodeficiency Virus, Lymphedema, Sickle Cell Disease Respiratory Denies history of Aspiration, Asthma, Chronic Obstructive Pulmonary Disease (COPD), Pneumothorax, Sleep Apnea, Tuberculosis Cardiovascular Patient has history of Arrhythmia - A. Fib, Coronary Artery Disease, Hypertension Denies history of Angina, Congestive Heart Failure, Hypotension, Myocardial Infarction, Peripheral Arterial Disease, Peripheral Venous Disease, Phlebitis, Vasculitis Gastrointestinal Denies history of Cirrhosis , Colitis, Crohnoos, Hepatitis A, Hepatitis B, Hepatitis C Endocrine Denies history of Type I Diabetes, Type II Diabetes Genitourinary Patient has history of End Stage Renal Disease - Stage IV CKD Immunological Denies history of Lupus Erythematosus, Raynaudoos, Scleroderma  Integumentary (Skin) Denies history of History of Burn Musculoskeletal Denies history of Gout, Rheumatoid Arthritis, Osteoarthritis, Osteomyelitis Neurologic Denies history of Dementia, Neuropathy, Quadriplegia, Paraplegia, Seizure Disorder Oncologic Denies history of Received Chemotherapy, Received Radiation Psychiatric Denies history of Anorexia/bulimia, Confinement Anxiety Hospitalization/Surgery History - CVA 15 years ago. 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 Vitals Time Taken: 1:46 PM, Height: 73 in, Weight: 185  lbs, BMI: 24.4, Temperature: 97.8 F, Pulse: 49 bpm, Respiratory Rate: 18 breaths/min, Blood Pressure: 170/70 mmHg. Cardiovascular 2+ dorsalis pedis/posterior tibialis pulses. Psychiatric pleasant and cooperative. General Notes: Epithelization to the previous wound sites to the right lower extremity. Venous stasis dermatitis. 2+ pitting edema to the knee. Integumentary (Hair, Skin) Wound #4 status is Healed - Epithelialized. Original cause of wound was Blister. The date acquired was: 03/27/2022. The wound has been in treatment 2 weeks. The wound is located on the Right,Lateral Lower Leg. The wound measures 0cm length x 0cm width x 0cm depth; 0cm^2 area and 0cm^3 volume. There is Fat Layer (Subcutaneous Tissue) exposed. There is no tunneling or undermining noted. There is a medium amount of serosanguineous drainage noted. The wound margin is distinct with the outline attached to the wound base. There is large (67-100%) red granulation within the wound bed. There is no necrotic tissue within the wound bed. The periwound skin appearance exhibited: Dry/Scaly, Hemosiderin Staining. The periwound skin appearance did not exhibit: Callus, Crepitus, Excoriation, Induration, Rash, Scarring, Maceration, Atrophie Blanche, Cyanosis, Ecchymosis, Mottled, Pallor, Rubor, Erythema. Wound #5 status is Healed - Epithelialized. Original cause of wound was Gradually Appeared. The date acquired was: 03/27/2022. The wound has been in treatment 2 weeks. The wound is located on the Right,Medial Lower Leg. The wound measures 0cm length x 0cm width x 0cm depth; 0cm^2 area and 0cm^3 volume. There is Fat Layer (Subcutaneous Tissue) exposed. There is no tunneling or undermining noted. There is a medium amount of serosanguineous drainage noted. The wound margin is distinct with the outline attached to the wound base. There is large (67-100%) red granulation within the wound bed. There is no necrotic tissue within the wound bed.  The periwound skin appearance exhibited: Dry/Scaly, Hemosiderin Staining. The periwound skin appearance did not exhibit: Callus, Crepitus, Excoriation, Induration, Rash, Scarring, Maceration, Atrophie Blanche, Cyanosis, Ecchymosis, Mottled, Pallor, Rubor, Erythema. Assessment Active Problems ICD-10 Unspecified open wound, right lower leg, initial encounter Chronic venous hypertension (idiopathic) with ulcer of right lower extremity Chronic atrial fibrillation, unspecified Long term (current) use of anticoagulants Hemiplegia and hemiparesis following cerebral infarction affecting right dominant side Patient has done well with Xeroform under compression therapy. His right lower extremity wounds have healed. I recommended compression stockings daily. Follow-up as needed. Plan Discharge From [facility] Services: Discharge from [facility] Edema Control - Lymphedema / SCD / Other: Elevate legs to the level of the heart or above for 30 minutes daily and/or when sitting for 3-4 times a day throughout the day. Avoid standing for long periods of time. Patient to wear own compression stockings every day. Exercise regularly Moisturize legs daily. - every night before bed. Compression stocking or Garment 20-30 mm/Hg pressure to: - apply in the morning and remove at night. 1. Compression stockings daily 2. Discharge from clinic due to closed wound 3. Follow-up as  needed Electronic Signature(s) Signed: 04/18/2022 3:41:53 PM By: Sharlyne Pacas ([id number]) [id number] Page 6 of 8 Entered By: [name] on 04/18/2022 14:53:30 -------------------------------------------------------------------------------- HxROS Details Patient Name: Date of Service: [name], [name] 04/18/2022 1:30 PM Medical Record Number: [id number] Patient Account Number: [id number] Date of Birth/Sex: Treating RN: Aug 09, 1928 (89 y.o. [name] Primary Care Provider:  Garret [name] Other Clinician: Referring Provider: Treating Provider/Extender: [name] in Treatment: 2 Constitutional Symptoms (General Health) Medical History: Past Medical History Notes: CVA- right sided weakness Aphasia Eyes Medical History: Negative for: Cataracts; Glaucoma; Optic Neuritis Past Medical History Notes: macular degeneration Ear/Nose/Mouth/Throat Medical History: Negative for: Chronic sinus problems/congestion; Middle ear problems Hematologic/Lymphatic Medical History: Positive for: Anemia Negative for: Hemophilia; Human Immunodeficiency Virus; Lymphedema; Sickle Cell Disease Respiratory Medical History: Negative for: Aspiration; Asthma; Chronic Obstructive Pulmonary Disease (COPD); Pneumothorax; Sleep Apnea; Tuberculosis Cardiovascular Medical History: Positive for: Arrhythmia - A. Fib; Coronary Artery Disease; Hypertension Negative for: Angina; Congestive Heart Failure; Hypotension; Myocardial Infarction; Peripheral Arterial Disease; Peripheral Venous Disease; Phlebitis; Vasculitis Gastrointestinal Medical History: Negative for: Cirrhosis ; Colitis; Crohns; Hepatitis A; Hepatitis B; Hepatitis C Endocrine Medical History: Negative for: Type I Diabetes; Type II Diabetes Genitourinary Medical History: Positive for: End Stage Renal Disease - Stage IV CKD Past Medical History Notes: BPH Immunological Medical History: Negative for: Lupus Erythematosus; Raynauds; Scleroderma Integumentary (Skin) [name], [name] ([id number]) [id number] Page 7 of 8 Medical History: Negative for: History of Burn Musculoskeletal Medical History: Negative for: Gout; Rheumatoid Arthritis; Osteoarthritis; Osteomyelitis Neurologic Medical History: Negative for: Dementia; Neuropathy; Quadriplegia; Paraplegia; Seizure Disorder Oncologic Medical History: Negative for: Received Chemotherapy; Received Radiation Past  Medical History Notes: Hx squamous cell right hand, 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 Psychiatric Medical History: Negative for: Anorexia/bulimia; Confinement Anxiety Immunizations Pneumococcal Vaccine: Received Pneumococcal Vaccination: No Implantable Devices No devices added Hospitalization / Surgery History Type of Hospitalization/Surgery CVA 15 years ago Family and Social History Cancer: No; Diabetes: Yes - Siblings; Heart Disease: Yes - Mother; Hereditary Spherocytosis: No; Hypertension: Yes - Mother; Kidney Disease: Yes - Siblings; Lung Disease: No; Seizures: No; Stroke: Yes - Mother; Thyroid Problems: No; Tuberculosis: No; Former smoker - 60 years ago; Marital Status - Widowed; Alcohol Use: Never; Drug Use: No History; Caffeine Use: Never; Financial Concerns: No; Food, Clothing or Shelter Needs: No; Support System Lacking: No; Transportation Concerns: No Electronic Signature(s) Signed: 04/18/2022 3:41:53 PM By: [name] Shan DO Signed: 04/18/2022 4:50:49 PM By: Deon Pilling RN, BSN Entered By: [name] on 04/18/2022 14:50:58 -------------------------------------------------------------------------------- [place] Details Patient Name: Date of Service: [name] 04/18/2022 Medical Record Number: [id number] Patient Account Number: [id number] Date of Birth/Sex: Treating RN: [birth year] (89 y.o. [name], Meta.Reding Primary Care Provider: Garret [name] Other Clinician: Referring Provider: Treating Provider/Extender: [name] in Treatment: 2 Diagnosis Coding ICD-10 Codes Code Description [phone] Unspecified open wound, right lower leg, initial encounter I87.311 Chronic venous hypertension (idiopathic) with ulcer of right lower extremity I48.20 Chronic atrial fibrillation, unspecified Z79.01 Long term (current) use of anticoagulants I69.351 Hemiplegia and hemiparesis following cerebral infarction affecting right dominant side [name], DEGRAAF  ([id number]) [id number] Page 8 of 8 Facility Procedures : CPT4 Code: [id number] Description: 99213 - WOUND CARE VISIT-LEV 3 EST PT Modifier: Quantity: 1 Physician Procedures : CPT4 Code Description Modifier [id number] - WC PHYS LEVEL 3 - EST PT ICD-10 Diagnosis Description I87.311 Chronic venous hypertension (idiopathic) with ulcer of right lower extremity S81.801A Unspecified open wound, right lower leg, initial  encounter  Quantity: 1 Electronic Signature(s) Signed: 04/18/2022 3:41:53 PM By: [name] Shan DO Entered By: [name] on 04/18/2022 14:54:12

## 2022-04-25 ENCOUNTER — Ambulatory Visit (HOSPITAL_BASED_OUTPATIENT_CLINIC_OR_DEPARTMENT_OTHER): Payer: Medicare Other | Admitting: Internal Medicine

## 2022-04-25 NOTE — Telephone Encounter (Signed)
I am out of office wednesday-we could try to add him on last slot of morning on Friday if he is agreeable (may be substantial wait but I'm willing to try)

## 2022-04-25 NOTE — Telephone Encounter (Signed)
Pt's daughter states pt's bp continues to stay elevated. She is wanting to know if Yong Channel can squeeze him in sometime this week. Please advise

## 2022-04-25 NOTE — Telephone Encounter (Signed)
See below

## 2022-04-26 NOTE — Telephone Encounter (Signed)
Pt seeing dr Yong Channel on 3/1 at 1020am

## 2022-04-26 NOTE — Telephone Encounter (Signed)
Pt daughter was agreeable, sending to [facility]

## 2022-04-28 ENCOUNTER — Ambulatory Visit (INDEPENDENT_AMBULATORY_CARE_PROVIDER_SITE_OTHER): Payer: Medicare Other | Admitting: Family Medicine

## 2022-04-28 ENCOUNTER — Encounter: Payer: Self-pay | Admitting: Family Medicine

## 2022-04-28 VITALS — BP 166/62 | HR 52 | Temp 97.4°F | Ht 72.0 in | Wt 181.4 lb

## 2022-04-28 DIAGNOSIS — I10 Essential (primary) hypertension: Secondary | ICD-10-CM

## 2022-04-28 MED ORDER — FUROSEMIDE 20 MG PO TABS: 20.0000 mg | ORAL_TABLET | Freq: Every day | ORAL | 3 refills | Status: DC

## 2022-04-28 NOTE — Progress Notes (Signed)
Phone [phone] In person visit   Subjective:   [name] is a 89 y.o. year old very pleasant male patient who presents for/with See problem oriented charting Chief Complaint  Patient presents with   Follow-up   Hypertension    Pt daughter states they have been checking readings at home and numbers are staying high 167/77 and 174/78   Past Medical History-  Patient Active Problem List   Diagnosis Date Noted   Heart failure with preserved ejection fraction ([place]) 06/14/2021    Priority: High   CKD (chronic kidney disease), stage IV ([place]) 11/22/2015    Priority: High   Memory loss 07/21/2014    Priority: High   Chronic atrial fibrillation ([place]) 07/08/2011    Priority: High   Hemiparesis affecting right side as late effect of cerebrovascular accident ([place]) 03/26/2007    Priority: High   CAD (coronary artery disease) 08/23/2006    Priority: High   Macular degeneration 01/21/2020    Priority: Medium    Venous insufficiency 10/02/2016    Priority: Medium    Right hydrocele 04/03/2016    Priority: Medium    Thrombocytopenia ([place]) 11/22/2015    Priority: Medium    Bradycardia 07/09/2011    Priority: Medium    Major depression in remission ([place]) 07/26/2007    Priority: Medium    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    Priority: Medium    Hyperlipidemia 08/23/2006    Priority: Medium    Essential hypertension 08/23/2006    Priority: Medium    Disorder of bone and cartilage 06/14/2021    Priority: Low   Disorder of gallbladder 06/14/2021    Priority: Low   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 90 tablet 1   Ferrous Sulfate (IRON PO) Take 1 tablet by mouth daily.     furosemide (LASIX) 20 MG tablet Take 1 tablet (20 mg total) by mouth daily. To be taken in addition to 40 mg dose for total of '60mg'$  daily furosemide 90 tablet 3   furosemide (LASIX) 40 MG tablet TAKE 1 TABLET BY MOUTH ONCE DAILY 90 tablet 3   Lansoprazole (PREVACID PO) Take 15 mg by mouth daily.     memantine (NAMENDA) 10 MG tablet Take 1 tablet (10 mg total) by mouth daily. 90 tablet 3   Multiple Vitamin (MULTIVITAMIN) tablet Take 1 tablet by mouth daily.     rosuvastatin (CRESTOR) 20 MG tablet TAKE 1 TABLET BY MOUTH EVERY DAY 90 tablet 3   tamsulosin (FLOMAX) 0.4 MG CAPS capsule TAKE 1 CAPSULE BY MOUTH EVERY NIGHT AT BEDTIME *DO NOT CRUSH OR CHEW* **DO NOT OPEN CAPSULE** 90 capsule 3   triamcinolone cream (KENALOG) 0.1 % Apply 1 application topically 2 (two) times daily. For 7-10 days maximum 160 g 0   No  current facility-administered medications for this visit.     Objective:  BP (!) 166/62   Pulse (!) 52   Temp (!) 97.4 F (36.3 C)   Ht 6' (1.829 m)   Wt 181 lb 6.4 oz (82.3 kg)   SpO2 95%   BMI 24.60 kg/m  Gen: NAD, resting comfortably CV: irregularly irregular  Lungs: CTAB no crackles, wheeze, rhonchi Ext: 1+ edema on left, 1-2+ on right Skin: warm, dry     Assessment and Plan   #hypertension S: medication: amlodipine 5 mg, lasix 40 mg  - looking back at notes from nephrology was to be on 60 mg lasix BP Readings from Last 3 Encounters:  04/28/22 (!) 166/62  04/17/22 (!) 152/62  01/17/22 (!) 186/74  A/P: poor control- Increase furosemide/lasix by 20 mg to 40 mg total per day. Continue amlodipine 5 mg- hesitant to increase further with edema history and HF  -recheck  renal function next visit -also reached out to nephrology Dr. Carolin Sicks for thoughts on blood pressure management if this is not effective   Recommended follow up: Follow up in 1-2 weeks. Can use same day slot  Future Appointments  Date Time Provider [place]  10/16/2022 11:00 AM [name], MD [facility]    Lab/Order associations: No diagnosis found.  Meds ordered this encounter  Medications   furosemide (LASIX) 20 MG tablet    Sig: Take 1 tablet (20 mg total) by mouth daily. To be taken in addition to 40 mg dose for total of '60mg'$  daily furosemide    Dispense:  90 tablet    Refill:  3    Return precautions advised.  [name], MD

## 2022-04-28 NOTE — Patient Instructions (Addendum)
Increase furosemide/lasix by 20 mg to 40 mg total per day.  - continue amlodipine 5 mg -continue to monitor at home and bring in to each visit  Recommended follow up: Follow up in 1-2 weeks. Can use same day slot

## 2022-05-01 ENCOUNTER — Telehealth: Payer: Self-pay | Admitting: Family Medicine

## 2022-05-01 NOTE — Telephone Encounter (Signed)
That's fine the 1.5 tablets of 40 mg lasix - can they work it out with pill packs?

## 2022-05-01 NOTE — Telephone Encounter (Signed)
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   Caller states: - Unable to bill lasix to insurance since 2 different dosages listed - Requests authorization to change dosage to 40 mg, 1 and 1/2 tabs daily; quantity 135 tablets  - Direct line [phone]

## 2022-05-01 NOTE — Telephone Encounter (Signed)
See below, ok for change?

## 2022-05-02 MED ORDER — FUROSEMIDE 40 MG PO TABS: ORAL_TABLET | ORAL | 3 refills | Status: DC

## 2022-05-02 NOTE — Telephone Encounter (Signed)
Rx edited and sent to pharmacy.

## 2022-05-04 ENCOUNTER — Encounter: Payer: Self-pay | Admitting: Radiology

## 2022-05-08 ENCOUNTER — Encounter: Payer: Self-pay | Admitting: Family Medicine

## 2022-05-08 ENCOUNTER — Ambulatory Visit (INDEPENDENT_AMBULATORY_CARE_PROVIDER_SITE_OTHER): Payer: Medicare Other | Admitting: Family Medicine

## 2022-05-08 VITALS — BP 154/70 | HR 66 | Temp 97.7°F | Ht 72.0 in | Wt 185.8 lb

## 2022-05-08 DIAGNOSIS — N184 Chronic kidney disease, stage 4 (severe): Secondary | ICD-10-CM

## 2022-05-08 DIAGNOSIS — I10 Essential (primary) hypertension: Secondary | ICD-10-CM

## 2022-05-08 LAB — CBC WITH DIFFERENTIAL/PLATELET
Basophils Absolute: 0 10*3/uL (ref 0.0–0.1)
Basophils Relative: 0.8 % (ref 0.0–3.0)
Eosinophils Absolute: 0.3 10*3/uL (ref 0.0–0.7)
Eosinophils Relative: 4.9 % (ref 0.0–5.0)
HCT: 29.3 % — ABNORMAL LOW (ref 39.0–52.0)
Hemoglobin: 9.9 g/dL — ABNORMAL LOW (ref 13.0–17.0)
Lymphocytes Relative: 18.1 % (ref 12.0–46.0)
Lymphs Abs: 0.9 10*3/uL (ref 0.7–4.0)
MCHC: 33.9 g/dL (ref 30.0–36.0)
MCV: 97 fl (ref 78.0–100.0)
Monocytes Absolute: 0.6 10*3/uL (ref 0.1–1.0)
Monocytes Relative: 11.5 % (ref 3.0–12.0)
Neutro Abs: 3.3 10*3/uL (ref 1.4–7.7)
Neutrophils Relative %: 64.7 % (ref 43.0–77.0)
Platelets: 179 10*3/uL (ref 150.0–400.0)
RBC: 3.02 Mil/uL — ABNORMAL LOW (ref 4.22–5.81)
RDW: 15 % (ref 11.5–15.5)
WBC: 5.2 10*3/uL (ref 4.0–10.5)

## 2022-05-08 LAB — RENAL FUNCTION PANEL
Albumin: 3.8 g/dL (ref 3.5–5.2)
BUN: 48 mg/dL — ABNORMAL HIGH (ref 6–23)
CO2: 22 mEq/L (ref 19–32)
Calcium: 8.9 mg/dL (ref 8.4–10.5)
Chloride: 110 mEq/L (ref 96–112)
Creatinine, Ser: 3.13 mg/dL — ABNORMAL HIGH (ref 0.40–1.50)
GFR: 16.73 mL/min — ABNORMAL LOW (ref >60.00)
Glucose, Bld: 102 mg/dL — ABNORMAL HIGH (ref 70–99)
Phosphorus: 4.4 mg/dL (ref 2.3–4.6)
Potassium: 4.7 mEq/L (ref 3.5–5.1)
Sodium: 143 mEq/L (ref 135–145)

## 2022-05-08 LAB — HEPATIC FUNCTION PANEL
ALT: 21 U/L (ref 0–53)
AST: 18 U/L (ref 0–37)
Albumin: 3.8 g/dL (ref 3.5–5.2)
Alkaline Phosphatase: 124 U/L — ABNORMAL HIGH (ref 39–117)
Bilirubin, Direct: 0.1 mg/dL (ref 0.0–0.3)
Total Bilirubin: 0.4 mg/dL (ref 0.2–1.2)
Total Protein: 6.7 g/dL (ref 6.0–8.3)

## 2022-05-08 LAB — TSH: TSH: 3.88 u[IU]/mL (ref 0.35–5.50)

## 2022-05-08 NOTE — Progress Notes (Signed)
Phone [phone] In person visit   Subjective:   [name] is a 89 y.o. year old very pleasant male patient who presents for/with See problem oriented charting Chief Complaint  Patient presents with   Follow-up    Pt has no questions or concerns    Hypertension   Past Medical History-  Patient Active Problem List   Diagnosis Date Noted   Heart failure with preserved ejection fraction ([place]) 06/14/2021    Priority: High   CKD (chronic kidney disease), stage IV ([place]) 11/22/2015    Priority: High   Memory loss 07/21/2014    Priority: High   Chronic atrial fibrillation ([place]) 07/08/2011    Priority: High   Hemiparesis affecting right side as late effect of cerebrovascular accident ([place]) 03/26/2007    Priority: High   CAD (coronary artery disease) 08/23/2006    Priority: High   Macular degeneration 01/21/2020    Priority: Medium    Venous insufficiency 10/02/2016    Priority: Medium    Right hydrocele 04/03/2016    Priority: Medium    Thrombocytopenia ([place]) 11/22/2015    Priority: Medium    Bradycardia 07/09/2011    Priority: Medium    Major depression in remission ([place]) 07/26/2007    Priority: Medium    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    Priority: Medium    Hyperlipidemia 08/23/2006    Priority: Medium    Essential hypertension 08/23/2006    Priority: Medium    Disorder of bone and cartilage 06/14/2021    Priority: Low   Disorder of gallbladder 06/14/2021    Priority: Low   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 90 tablet 1   Ferrous Sulfate (IRON PO) Take 1 tablet by mouth daily.     furosemide (LASIX) 20 MG tablet Take 1 tablet (20 mg total) by mouth daily. To be taken in addition to 40 mg dose for total of '60mg'$  daily furosemide 90 tablet 3   furosemide (LASIX) 40 MG tablet Take 1 and 1/2 tablets daily. 135 tablet 3   Lansoprazole (PREVACID PO) Take 15 mg by mouth daily.     memantine (NAMENDA) 10 MG tablet Take 1 tablet (10 mg total) by mouth daily. 90 tablet 3   Multiple Vitamin (MULTIVITAMIN) tablet Take 1 tablet by mouth daily.     rosuvastatin (CRESTOR) 20 MG tablet TAKE 1 TABLET BY MOUTH EVERY DAY 90 tablet 3   tamsulosin (FLOMAX) 0.4 MG CAPS capsule TAKE 1 CAPSULE BY MOUTH EVERY NIGHT AT BEDTIME *DO NOT CRUSH OR CHEW* **DO NOT OPEN CAPSULE** 90 capsule 3   triamcinolone cream (KENALOG) 0.1 % Apply 1 application topically 2 (two) times daily. For 7-10 days maximum 160 g 0   No current facility-administered medications for this visit.     Objective:  BP (!) 154/70   Pulse 66   Temp 97.7 F (36.5 C) (Temporal)   Ht 6' (1.829 m)   Wt 185 lb 12.8 oz (84.3 kg)   SpO2 99%   BMI 25.20 kg/m  Gen: NAD, resting comfortably CV:  irregularly irregular  Lungs: CTAB no crackles, wheeze, rhonchi Abdomen: soft/nontender/nondistended/normal bowel sounds. No rebound or guarding.  Ext: 1+ edema on the left edema, 1-2+ edema on the right lower extremity-10 x 5 cm skin tear that appears to be healing well without significant surrounding erythema, warmth, tenderness when compared to the opposite leg Skin: warm, dry Neuro: Garbled baseline speech    Assessment and Plan   #hypertension S: medication: amlodipine 5 mg, lasix 60 mg up from '40mg'$ - we called aide at home who confirmed 1.5 tablets in jar BP Readings from Last 3 Encounters:   05/08/22 (!) 154/70  04/28/22 (!) 166/62  04/17/22 (!) 152/62  A/P: Blood pressure remains elevated but trending down-not clear when patient started the Lasix 60 mg exactly and edema similar with weight up some-hoping for further blood pressure reduction as he continues to take this -continue current medications for now  - Check CMP with labs today as well as TSH to see if any relation to elevated blood pressure - We also wondered if patient had increased smalt intake at home-daughter is going to check with [facility] and reinforce low in salt diet -He sees nephrology in April and we thought that was a reasonable as next evaluation-with edema hesitant to increase amlodipine further -Update labs and can forward to Dr. Katheren Shams are more challenging due to CKD stage IV and risk of worsening that.  Suspect CKD stage IV stable or at least hopefully this is we have increased furosemide  # Right leg wound-developed sometime last week-patient unable to share details.  Daughter saw her on Friday and states that appears similar today about 10 x 5 cm.  No surrounding redness or warmth or tenderness-baseline edema noted - We discussed daily wound changes and need for this to heal by secondary intention -Up-to-date on tetanus shot from 2021 -Discussed indications for sooner follow-up  Recommended follow up: Return in about 2 months (around 07/08/2022) for followup or sooner if needed.Schedule b4 you leave. Future Appointments  Date Time Provider [place]  07/12/2022 11:20 AM [name], MD [facility]  10/16/2022 11:00 AM [name], MD [facility]   Lab/Order associations:   ICD-10-CM   1. Essential hypertension  I10 Renal Function Panel    TSH    2. CKD (chronic kidney disease), stage IV ([facility])  N18.4 CBC with Differential/Platelet    Hepatic function panel    Renal Function Panel     Return precautions advised.  [name], MD

## 2022-05-08 NOTE — Patient Instructions (Addendum)
Blood pressure improving but still above goal- lets try to refocus efforts on reducing salt intake- get low salt diet at [place] pushed more aggressively and avoid adding salt as well  Let meknow how pressure looks in April with Raeanne Gathers- I really need their help for other options at this point  Dressing changes daily on the right leg- see if aide can help- if redness, warmth, tenderness worsen see [place] back asap- would be signs of infection- should gradually improve with time without infection- could call wound center back as option  Please stop by lab before you go If you have mychart- we will send your results within 3 business days of [place] receiving them.  If you do not have mychart- we will call you about results within 5 business days of [place] receiving them.  *please also note that you will see labs on mychart as soon as they post. I will later go in and write notes on them- will say "notes from Dr. Yong Channel"   Recommended follow up: Return in about 2 months (around 07/08/2022) for followup or sooner if needed.Schedule b4 you leave.

## 2022-05-16 ENCOUNTER — Encounter: Payer: Self-pay | Admitting: Family Medicine

## 2022-07-05 ENCOUNTER — Telehealth: Payer: Self-pay | Admitting: Family Medicine

## 2022-07-05 NOTE — Telephone Encounter (Signed)
Contacted [name] to schedule their annual wellness visit. Appointment made for 07/13/2022.  [name] [facility] TEAM Direct Dial [phone]

## 2022-07-12 ENCOUNTER — Encounter: Payer: Self-pay | Admitting: Family Medicine

## 2022-07-12 ENCOUNTER — Ambulatory Visit (INDEPENDENT_AMBULATORY_CARE_PROVIDER_SITE_OTHER): Payer: Medicare Other | Admitting: Family Medicine

## 2022-07-12 VITALS — BP 156/62 | HR 58 | Temp 98.4°F | Ht 72.0 in | Wt 180.6 lb

## 2022-07-12 DIAGNOSIS — I482 Chronic atrial fibrillation, unspecified: Secondary | ICD-10-CM

## 2022-07-12 DIAGNOSIS — I10 Essential (primary) hypertension: Secondary | ICD-10-CM

## 2022-07-12 DIAGNOSIS — N184 Chronic kidney disease, stage 4 (severe): Secondary | ICD-10-CM | POA: Diagnosis not present

## 2022-07-12 MED ORDER — DOXAZOSIN MESYLATE 4 MG PO TABS: 4.0000 mg | ORAL_TABLET | Freq: Every day | ORAL | 5 refills | Status: DC

## 2022-07-12 NOTE — Patient Instructions (Addendum)
When current pill pack runs out STOP tamsulosin 0.4 mg Start doxazosin 4 mg  Hoping maybe blood pressure even better by kidney doctor visit  Recommended follow up: Return in about 1 month (around 08/12/2022) for followup or sooner if needed.Schedule b4 you leave.

## 2022-07-12 NOTE — Progress Notes (Signed)
Phone [phone] In person visit   Subjective:   [name] is a 89 y.o. year old very pleasant male patient who presents for/with See problem oriented charting Chief Complaint  Patient presents with   Medical Management of Chronic Issues   Hypertension    Has not been taking consistently    Past Medical History-  Patient Active Problem List   Diagnosis Date Noted   Heart failure with preserved ejection fraction ([facility]) 06/14/2021    Priority: High   CKD (chronic kidney disease), stage IV ([facility]) 11/22/2015    Priority: High   Memory loss 07/21/2014    Priority: High   Chronic atrial fibrillation ([facility]) 07/08/2011    Priority: High   Hemiparesis affecting right side as late effect of cerebrovascular accident ([facility]) 03/26/2007    Priority: High   CAD (coronary artery disease) 08/23/2006    Priority: High   Macular degeneration 01/21/2020    Priority: Medium    Venous insufficiency 10/02/2016    Priority: Medium    Right hydrocele 04/03/2016    Priority: Medium    Thrombocytopenia ([facility]) 11/22/2015    Priority: Medium    Bradycardia 07/09/2011    Priority: Medium    Major depression in remission ([facility]) 07/26/2007    Priority: Medium    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    Priority: Medium    Hyperlipidemia 08/23/2006    Priority: Medium    Essential hypertension 08/23/2006    Priority: Medium    Disorder of bone and cartilage 06/14/2021    Priority: Low   Disorder of gallbladder 06/14/2021    Priority: Low   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 90 tablet 1   Ferrous Sulfate (IRON PO) Take 1 tablet by mouth daily.     furosemide (LASIX) 20 MG tablet Take 1 tablet (20 mg total) by mouth daily. To be taken in addition to 40 mg dose for total of 60mg  daily furosemide 90 tablet 3   furosemide (LASIX) 40 MG tablet Take 1 and 1/2 tablets daily. 135 tablet 3   Lansoprazole (PREVACID PO) Take 15 mg by mouth daily.     memantine (NAMENDA) 10 MG tablet Take 1 tablet (10 mg total) by mouth daily. 90 tablet 3   Multiple Vitamin (MULTIVITAMIN) tablet Take 1 tablet by mouth daily.     rosuvastatin (CRESTOR) 20 MG tablet TAKE 1 TABLET BY MOUTH EVERY DAY 90 tablet 3   tamsulosin (FLOMAX) 0.4 MG CAPS capsule TAKE 1 CAPSULE BY MOUTH EVERY NIGHT AT BEDTIME *DO NOT CRUSH OR CHEW* **DO NOT OPEN CAPSULE** 90 capsule 3   triamcinolone cream (KENALOG) 0.1 % Apply 1 application topically 2 (two) times daily. For 7-10 days maximum 160 g 0   No current facility-administered medications for this visit.  Objective:  BP (!) 156/62   Pulse (!) 58   Temp 98.4 F (36.9 C)   Ht 6' (1.829 m)   Wt 180 lb 9.6 oz (81.9 kg)   SpO2 96%   BMI 24.49 kg/m  Gen: NAD, resting comfortably CV: RRR no murmurs rubs or gallops Lungs: CTAB no crackles, wheeze, rhonchi Ext: trace edema Skin: warm, dry Neuro: walks with rollator walker    Assessment and Plan   #social update- more sleepy after loss of wife  #hypertension S: medication: Amlodipine 5 mg, Lasix 60 mg -Plan last visit was to try to reduce salt intake as well- not eating much per family Slightly more winded than in past BP Readings from Last 3 Encounters:  07/12/22 (!) 156/62  05/08/22 (!) 154/70  04/28/22 (!) 166/62  A/P: blood pressure above goal - continue amlodipine and lasix When current pill pack runs  out STOP tamsulosin 0.4 mg Start doxazosin 4 mg (warned of orthostatic potential)  # Atrial fibrillation S: Rate controlled without medicine Anticoagulated with eliquis 2.5 mg  A/P: appropriately anticoagulated and rate controlled (without medications)- continue current medicine   #CKD IV- follows with Dr. Ronalee Belts- has upcoming visit- we opted to hold off onlabs today as result. Hoping getting blood pressure better will help kidney function  Recommended follow up: Return in about 1 month (around 08/12/2022) for followup or sooner if needed.Schedule b4 you leave. Future Appointments  Date Time Provider Department Center  07/13/2022  3:30 PM [facility] ANNUAL WELLNESS VISIT 1 [facility]  10/16/2022 11:00 AM Shelva Majestic, MD [facility]    Lab/Order associations:   ICD-10-CM   1. Chronic atrial fibrillation (HCC)  I48.20     2. Essential hypertension  I10     3. CKD (chronic kidney disease), stage IV (HCC)  N18.4       Meds ordered this encounter  Medications   doxazosin (CARDURA) 4 MG tablet    Sig: Take 1 tablet (4 mg total) by mouth daily.    Dispense:  30 tablet    Refill:  5    Replacement for tamsulosin to also help with blood pressure    Return precautions advised.  Tana Conch, MD

## 2022-07-13 ENCOUNTER — Ambulatory Visit (INDEPENDENT_AMBULATORY_CARE_PROVIDER_SITE_OTHER): Payer: Medicare Other

## 2022-07-13 VITALS — Wt 180.0 lb

## 2022-07-13 DIAGNOSIS — Z0000 Encounter for general adult medical examination without abnormal findings: Secondary | ICD-10-CM

## 2022-07-13 NOTE — Progress Notes (Signed)
I connected with  [name] on 07/13/22 by a audio enabled telemedicine application and verified that I am speaking with the correct person using two identifiers.  Patient Location: Home  Provider Location: Office/Clinic  I discussed the limitations of evaluation and management by telemedicine. The patient expressed understanding and agreed to proceed. [name] daughter    Subjective:   [name] is a 89 y.o. male who presents for Medicare Annual/Subsequent preventive examination.  Review of Systems     Cardiac Risk Factors include: advanced age (>41men, >68 women);dyslipidemia;male gender;hypertension     Objective:    Today's Vitals   07/13/22 1536  Weight: 180 lb (81.6 kg)   Body mass index is 24.41 kg/m.     07/13/2022    3:46 PM 09/17/2021   12:22 PM 10/05/2017    4:39 PM 07/08/2011    5:47 PM  Advanced Directives  Does Patient Have a Medical Advance Directive? Yes No Yes Patient has advance directive, copy in chart  Type of Advance Directive Healthcare Power of [place];Living will   Healthcare Power of Attorney  Copy of Healthcare Power of Attorney in Chart? No - copy requested     Would patient like information on creating a medical advance directive?  No - Patient declined    Pre-existing out of facility DNR order (yellow form or pink MOST form)    No    Current Medications (verified) Outpatient Encounter Medications as of 07/13/2022  Medication Sig   amLODipine (NORVASC) 5 MG tablet TAKE 1 TABLET BY MOUTH ONCE DAILY (Patient taking differently: TAKE 1 TABLET BY MOUTH ONCE DAILY stop at the end of the week)   Calcium Carb-Cholecalciferol (CALCIUM 500 + D3 PO) Take by mouth.   doxazosin (CARDURA) 4 MG tablet Take 1 tablet (4 mg total) by mouth daily. (Patient taking differently: Take 4 mg by mouth daily. Starts on Sunday)   ELIQUIS 2.5 MG TABS tablet TAKE 1 TABLET(2.5 MG) BY MOUTH TWICE DAILY   escitalopram (LEXAPRO) 20 MG tablet Take 1 tablet (20 mg total) by  mouth daily.   Ferrous Sulfate (IRON PO) Take 1 tablet by mouth daily.   furosemide (LASIX) 20 MG tablet Take 1 tablet (20 mg total) by mouth daily. To be taken in addition to 40 mg dose for total of 60mg  daily furosemide   furosemide (LASIX) 40 MG tablet Take 1 and 1/2 tablets daily.   Lansoprazole (PREVACID PO) Take 15 mg by mouth daily.   memantine (NAMENDA) 10 MG tablet Take 1 tablet (10 mg total) by mouth daily.   Multiple Vitamin (MULTIVITAMIN) tablet Take 1 tablet by mouth daily.   rosuvastatin (CRESTOR) 20 MG tablet TAKE 1 TABLET BY MOUTH EVERY DAY   [DISCONTINUED] triamcinolone cream (KENALOG) 0.1 % Apply 1 application topically 2 (two) times daily. For 7-10 days maximum   No facility-administered encounter medications on file as of 07/13/2022.    Allergies (verified) Ciprofloxacin hcl and Sulfamethoxazole-trimethoprim   History: Past Medical History:  Diagnosis Date   Actinic keratosis    BPH with urinary obstruction    Bradycardia    CAD (coronary artery disease)    CABG x5   Cerebrovascular accident (stroke) (HCC)    With right hemiparesis and persistent expressive aphasia   GERD (gastroesophageal reflux disease)    History of hydronephrosis    Hx of hydronephrosis 07/08/2011   right    Hyperlipidemia    Hypertension    Iron deficiency anemia due to chronic blood loss 03/26/2007  Now resolved     Iron deficiency anemia secondary to blood loss (chronic)    Persistent atrial fibrillation (HCC)    Pulmonary hypertension (HCC)    by echo 2013   Rosacea    Situational depression    Severe   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   treated after biopsy   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   [facility] - treated after biopsy   Tricuspid regurgitation    Past Surgical History:  Procedure Laterality Date   ABDOMINAL SURGERY     BACK SURGERY     BAND HEMORRHOIDECTOMY     CHOLECYSTECTOMY     CORONARY ARTERY BYPASS GRAFT      x 5   dental implants     HERNIA REPAIR     KIDNEY SURGERY     stenosis with stent   TONSILLECTOMY     Family History  Problem Relation Age of Onset   Heart disease Father    Heart attack Other        fhx   Coronary artery disease Other        fhx   Social History   Socioeconomic History   Marital status: Widowed    Spouse name: Not on file   Number of children: Not on file   Years of education: Not on file   Highest education level: Not on file  Occupational History   Not on file  Tobacco Use   Smoking status: Former   Smokeless tobacco: Never   Tobacco comments:    x 89 yo per the dtr  Substance and Sexual Activity   Alcohol use: Yes    Alcohol/week: 1.0 standard drink of alcohol    Types: 1 Glasses of wine per week    Comment: glass of wine at 7   Drug use: No   Sexual activity: Not Currently  Other Topics Concern   Not on file  Social History Narrative   Lives in [place] with spouse. LIves at a house at a retirement home. 3 children. 6 grandkids. 5 greatgrandkids.       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 [employer]   Social Determinants of Health   Financial Resource Strain: Low Risk  (07/13/2022)   Overall Financial Resource Strain (CARDIA)    Difficulty of Paying Living Expenses: Not hard at all  Food Insecurity: No Food Insecurity (07/13/2022)   Hunger Vital Sign    Worried About Running Out of Food in the Last Year: Never true    Ran Out of Food in the Last Year: Never true  Transportation Needs: No Transportation Needs (07/13/2022)   PRAPARE - Administrator, Civil Service (Medical): No    Lack of Transportation (Non-Medical): No  Physical Activity: Inactive (07/13/2022)   Exercise Vital Sign    Days of Exercise per Week: 0 days    Minutes of Exercise per Session: 0 min  Stress: No Stress Concern Present (07/13/2022)   [employer] of Occupational Health - Occupational Stress  Questionnaire    Feeling of Stress : Not at all  Social Connections: Socially Isolated (07/13/2022)   Social Connection and Isolation Panel [NHANES]    Frequency of Communication with Friends and Family: Never    Frequency of Social Gatherings with Friends and Family: More than three times a week    Attends Religious Services: Never  Active Member of Clubs or Organizations: No    Attends Banker Meetings: Never    Marital Status: Widowed    Tobacco Counseling Counseling given: Not Answered Tobacco comments: x 89 yo per the dtr   Clinical Intake:  Pre-visit preparation completed: Yes  Pain : No/denies pain     BMI - recorded: 24.41 Nutritional Status: BMI of 19-24  Normal Nutritional Risks: None Diabetes: No  How often do you need to have someone help you when you read instructions, pamphlets, or other written materials from your doctor or pharmacy?: 1 - Never  Diabetic?no  Interpreter Needed?: No  Information entered by :: Lanier Ensign, LPN   Activities of Daily Living    07/13/2022    3:48 PM  In your present state of health, do you have any difficulty performing the following activities:  Hearing? 1  Comment unsure pt can draw thought  Vision? 1  Comment macular degeneration  Difficulty concentrating or making decisions? 1  Walking or climbing stairs? 0  Dressing or bathing? 0  Doing errands, shopping? 0  Preparing Food and eating ? N  Using the Toilet? N  In the past six months, have you accidently leaked urine? N  Do you have problems with loss of bowel control? N  Managing your Medications? Y  Comment assistance  Managing your Finances? N  Housekeeping or managing your Housekeeping? Y  Comment assistance    Patient Care Team: Shelva Majestic, MD as PCP - General (Family Medicine) Kathleene Hazel, MD as PCP - Cardiology (Cardiology)  Indicate any recent Medical Services you may have received from other than [facility] providers  in the past year (date may be approximate).     Assessment:   This is a routine wellness examination for [place].  Hearing/Vision screen Hearing Screening - Comments:: Can write what he sees  Vision Screening - Comments:: Encouraged to follow up   Dietary issues and exercise activities discussed: Current Exercise Habits: The patient does not participate in regular exercise at present   Goals Addressed             This Visit's Progress    Patient Stated       Maintain health       Depression Screen    07/13/2022    3:44 PM 04/17/2022    2:21 PM 09/07/2021   11:17 AM 05/10/2021    4:10 PM 01/21/2020   11:31 AM 08/11/2019    1:49 PM 04/10/2019    1:55 PM  PHQ 2/9 Scores  PHQ - 2 Score 1 1 2  0 0    PHQ- 9 Score  3 3 0 0    Exception Documentation      Medical reason Medical reason  Not completed       Patient not able to answer    Fall Risk    07/13/2022    3:47 PM 04/17/2022    2:06 PM 01/27/2021    1:13 PM 08/11/2019    1:48 PM 04/15/2019    1:02 PM  Fall Risk   Falls in the past year? 0 1 1 0 0  Number falls in past yr: 0 0 0 0 0  Injury with Fall? 0 1 0 0 0  Risk for fall due to : Impaired vision;Impaired mobility;Impaired balance/gait   Impaired balance/gait;Mental status change   Risk for fall due to: Comment right side weaker side      Follow up Falls prevention discussed Falls evaluation  completed       FALL RISK PREVENTION PERTAINING TO THE HOME:  Any stairs in or around the home? No  If so, are there any without handrails? No  Home free of loose throw rugs in walkways, pet beds, electrical cords, etc? Yes  Adequate lighting in your home to reduce risk of falls? Yes   ASSISTIVE DEVICES UTILIZED TO PREVENT FALLS:  Life alert? Yes  Use of a cane, walker or w/c? Yes  Grab bars in the bathroom? Yes  Shower chair or bench in shower? Yes  Elevated toilet seat or a handicapped toilet? Yes   TIMED UP AND GO:  Was the test performed? No .   Cognitive  Function:declined         Immunizations Immunization History  Administered Date(s) Administered   Fluad Quad(high Dose 65+) 01/21/2020, 01/27/2021   Influenza Split 01/09/2011   Influenza Whole 02/28/2003, 12/18/2007, 11/24/2009, 11/08/2018   Influenza, High Dose Seasonal PF 10/28/2009, 11/22/2015, 01/22/2018   Influenza-Unspecified 11/27/2000, 11/27/2001, 02/28/2003, 12/01/2003, 12/28/2004, 01/08/2006, 11/28/2006, 10/29/2007, 11/27/2016   Moderna Covid-19 Vaccine Bivalent Booster 22yrs & up 02/03/2020   Moderna Sars-Covid-2 Vaccination 04/01/2019, 04/29/2019   Pneumococcal Conjugate-13 07/21/2014   Pneumococcal Polysaccharide-23 02/28/2004   Pneumococcal-Unspecified 02/28/1999   Td 02/27/2005, 08/11/2019    TDAP status: Up to date  Flu Vaccine status: Declined, Education has been provided regarding the importance of this vaccine but patient still declined. Advised may receive this vaccine at local pharmacy or Health Dept. Aware to provide a copy of the vaccination record if obtained from local pharmacy or Health Dept. Verbalized acceptance and understanding.  Pneumococcal vaccine status: Up to date  Covid-19 vaccine status: Completed vaccines  Qualifies for Shingles Vaccine? Yes   Zostavax completed No   Shingrix Completed?: No.    Education has been provided regarding the importance of this vaccine. Patient has been advised to call insurance company to determine out of pocket expense if they have not yet received this vaccine. Advised may also receive vaccine at local pharmacy or Health Dept. Verbalized acceptance and understanding.  Screening Tests Health Maintenance  Topic Date Due   COVID-19 Vaccine (4 - 2023-24 season) 10/28/2021   Zoster Vaccines- Shingrix (1 of 2) 07/29/2022 (Originally 10/22/1947)   INFLUENZA VACCINE  09/28/2022   Medicare Annual Wellness (AWV)  07/13/2023   DTaP/Tdap/Td (3 - Tdap) 08/10/2029   Pneumonia Vaccine 74+ Years old  Completed   HPV  VACCINES  Aged Out    Health Maintenance  Health Maintenance Due  Topic Date Due   COVID-19 Vaccine (4 - 2023-24 season) 10/28/2021    Colorectal cancer screening: No longer required.    Additional Screening:   Vision Screening: Recommended annual ophthalmology exams for early detection of glaucoma and other disorders of the eye. Is the patient up to date with their annual eye exam?  No  Who is the provider or what is the name of the office in which the patient attends annual eye exams? Dr Cathey Endow was last provider  If pt is not established with a provider, would they like to be referred to a provider to establish care? No .   Dental Screening: Recommended annual dental exams for proper oral hygiene  Community Resource Referral / Chronic Care Management: CRR required this visit?  No   CCM required this visit?  No      Plan:     I have personally reviewed and noted the following in the patient's chart:   Medical and social  history Use of alcohol, tobacco or illicit drugs  Current medications and supplements including opioid prescriptions. Patient is not currently taking opioid prescriptions. Functional ability and status Nutritional status Physical activity Advanced directives List of other physicians Hospitalizations, surgeries, and ER visits in previous 12 months Vitals Screenings to include cognitive, depression, and falls Referrals and appointments  In addition, I have reviewed and discussed with patient certain preventive protocols, quality metrics, and best practice recommendations. A written personalized care plan for preventive services as well as general preventive health recommendations were provided to patient.     Marzella Schlein, LPN   [id number]/61/[id number]   Nurse Notes: Pt unable to verbally express , this will be his last AWV per daughters request

## 2022-07-13 NOTE — Patient Instructions (Signed)
[name] , Thank you for taking time to come for your Medicare Wellness Visit. I appreciate your ongoing commitment to your health goals. Please review the following plan we discussed and let me know if I can assist you in the future.   These are the goals we discussed:  Goals      Patient Stated     To maintain his health     Patient Stated     Maintain health        This is a list of the screening recommended for you and due dates:  Health Maintenance  Topic Date Due   COVID-19 Vaccine (4 - 2023-24 season) 10/28/2021   Zoster (Shingles) Vaccine (1 of 2) 07/29/2022*   Flu Shot  09/28/2022   Medicare Annual Wellness Visit  07/13/2023   DTaP/Tdap/Td vaccine (3 - Tdap) 08/10/2029   Pneumonia Vaccine  Completed   HPV Vaccine  Aged Out  *Topic was postponed. The date shown is not the original due date.    Advanced directives: Please bring a copy of your health care power of attorney and living will to the office at your convenience.  Conditions/risks identified: maintain health   Next appointment: Follow up in one year for your annual wellness visit.   Preventive Care 87 Years and Older, Male  Preventive care refers to lifestyle choices and visits with your health care provider that can promote health and wellness. What does preventive care include? A yearly physical exam. This is also called an annual well check. Dental exams once or twice a year. Routine eye exams. Ask your health care provider how often you should have your eyes checked. Personal lifestyle choices, including: Daily care of your teeth and gums. Regular physical activity. Eating a healthy diet. Avoiding tobacco and drug use. Limiting alcohol use. Practicing safe sex. Taking low doses of aspirin every day. Taking vitamin and mineral supplements as recommended by your health care provider. What happens during an annual well check? The services and screenings done by your health care provider during your  annual well check will depend on your age, overall health, lifestyle risk factors, and family history of disease. Counseling  Your health care provider may ask you questions about your: Alcohol use. Tobacco use. Drug use. Emotional well-being. Home and relationship well-being. Sexual activity. Eating habits. History of falls. Memory and ability to understand (cognition). Work and work Astronomer. Screening  You may have the following tests or measurements: Height, weight, and BMI. Blood pressure. Lipid and cholesterol levels. These may be checked every 5 years, or more frequently if you are over 82 years old. Skin check. Lung cancer screening. You may have this screening every year starting at age 24 if you have a 30-pack-year history of smoking and currently smoke or have quit within the past 15 years. Fecal occult blood test (FOBT) of the stool. You may have this test every year starting at age 72. Flexible sigmoidoscopy or colonoscopy. You may have a sigmoidoscopy every 5 years or a colonoscopy every 10 years starting at age 30. Prostate cancer screening. Recommendations will vary depending on your family history and other risks. Hepatitis C blood test. Hepatitis B blood test. Sexually transmitted disease (STD) testing. Diabetes screening. This is done by checking your blood sugar (glucose) after you have not eaten for a while (fasting). You may have this done every 1-3 years. Abdominal aortic aneurysm (AAA) screening. You may need this if you are a current or former smoker. Osteoporosis. You  may be screened starting at age 30 if you are at high risk. Talk with your health care provider about your test results, treatment options, and if necessary, the need for more tests. Vaccines  Your health care provider may recommend certain vaccines, such as: Influenza vaccine. This is recommended every year. Tetanus, diphtheria, and acellular pertussis (Tdap, Td) vaccine. You may need a Td  booster every 10 years. Zoster vaccine. You may need this after age 27. Pneumococcal 13-valent conjugate (PCV13) vaccine. One dose is recommended after age 61. Pneumococcal polysaccharide (PPSV23) vaccine. One dose is recommended after age 26. Talk to your health care provider about which screenings and vaccines you need and how often you need them. This information is not intended to replace advice given to you by your health care provider. Make sure you discuss any questions you have with your health care provider. Document Released: 03/12/2015 Document Revised: 11/03/2015 Document Reviewed: 12/15/2014 Elsevier Interactive Patient Education  2017 [place] Prevention in the Home Falls can cause injuries. They can happen to people of all ages. There are many things you can do to make your home safe and to help prevent falls. What can I do on the outside of my home? Regularly fix the edges of walkways and driveways and fix any cracks. Remove anything that might make you trip as you walk through a door, such as a raised step or threshold. Trim any bushes or trees on the path to your home. Use bright outdoor lighting. Clear any walking paths of anything that might make someone trip, such as rocks or tools. Regularly check to see if handrails are loose or broken. Make sure that both sides of any steps have handrails. Any raised decks and porches should have guardrails on the edges. Have any leaves, snow, or ice cleared regularly. Use sand or salt on walking paths during winter. Clean up any spills in your garage right away. This includes oil or grease spills. What can I do in the bathroom? Use night lights. Install grab bars by the toilet and in the tub and shower. Do not use towel bars as grab bars. Use non-skid mats or decals in the tub or shower. If you need to sit down in the shower, use a plastic, non-slip stool. Keep the floor dry. Clean up any water that spills on the floor  as soon as it happens. Remove soap buildup in the tub or shower regularly. Attach bath mats securely with double-sided non-slip rug tape. Do not have throw rugs and other things on the floor that can make you trip. What can I do in the bedroom? Use night lights. Make sure that you have a light by your bed that is easy to reach. Do not use any sheets or blankets that are too big for your bed. They should not hang down onto the floor. Have a firm chair that has side arms. You can use this for support while you get dressed. Do not have throw rugs and other things on the floor that can make you trip. What can I do in the kitchen? Clean up any spills right away. Avoid walking on wet floors. Keep items that you use a lot in easy-to-reach places. If you need to reach something above you, use a strong step stool that has a grab bar. Keep electrical cords out of the way. Do not use floor polish or wax that makes floors slippery. If you must use wax, use non-skid floor wax. Do  not have throw rugs and other things on the floor that can make you trip. What can I do with my stairs? Do not leave any items on the stairs. Make sure that there are handrails on both sides of the stairs and use them. Fix handrails that are broken or loose. Make sure that handrails are as long as the stairways. Check any carpeting to make sure that it is firmly attached to the stairs. Fix any carpet that is loose or worn. Avoid having throw rugs at the top or bottom of the stairs. If you do have throw rugs, attach them to the floor with carpet tape. Make sure that you have a light switch at the top of the stairs and the bottom of the stairs. If you do not have them, ask someone to add them for you. What else can I do to help prevent falls? Wear shoes that: Do not have high heels. Have rubber bottoms. Are comfortable and fit you well. Are closed at the toe. Do not wear sandals. If you use a stepladder: Make sure that it is  fully opened. Do not climb a closed stepladder. Make sure that both sides of the stepladder are locked into place. Ask someone to hold it for you, if possible. Clearly mark and make sure that you can see: Any grab bars or handrails. First and last steps. Where the edge of each step is. Use tools that help you move around (mobility aids) if they are needed. These include: Canes. Walkers. Scooters. Crutches. Turn on the lights when you go into a dark area. Replace any light bulbs as soon as they burn out. Set up your furniture so you have a clear path. Avoid moving your furniture around. If any of your floors are uneven, fix them. If there are any pets around you, be aware of where they are. Review your medicines with your doctor. Some medicines can make you feel dizzy. This can increase your chance of falling. Ask your doctor what other things that you can do to help prevent falls. This information is not intended to replace advice given to you by your health care provider. Make sure you discuss any questions you have with your health care provider. Document Released: 12/10/2008 Document Revised: 07/22/2015 Document Reviewed: 03/20/2014 Elsevier Interactive Patient Education  2017 [employer].

## 2022-07-14 DIAGNOSIS — N184 Chronic kidney disease, stage 4 (severe): Secondary | ICD-10-CM | POA: Diagnosis not present

## 2022-07-14 DIAGNOSIS — N189 Chronic kidney disease, unspecified: Secondary | ICD-10-CM | POA: Diagnosis not present

## 2022-07-18 ENCOUNTER — Other Ambulatory Visit: Payer: Self-pay | Admitting: Family Medicine

## 2022-07-18 DIAGNOSIS — I129 Hypertensive chronic kidney disease with stage 1 through stage 4 chronic kidney disease, or unspecified chronic kidney disease: Secondary | ICD-10-CM | POA: Diagnosis not present

## 2022-07-18 DIAGNOSIS — N2581 Secondary hyperparathyroidism of renal origin: Secondary | ICD-10-CM | POA: Diagnosis not present

## 2022-07-18 DIAGNOSIS — N184 Chronic kidney disease, stage 4 (severe): Secondary | ICD-10-CM | POA: Diagnosis not present

## 2022-07-18 DIAGNOSIS — D631 Anemia in chronic kidney disease: Secondary | ICD-10-CM | POA: Diagnosis not present

## 2022-08-14 ENCOUNTER — Ambulatory Visit (INDEPENDENT_AMBULATORY_CARE_PROVIDER_SITE_OTHER): Payer: Medicare Other | Admitting: Family Medicine

## 2022-08-14 ENCOUNTER — Encounter: Payer: Self-pay | Admitting: Family Medicine

## 2022-08-14 VITALS — BP 142/62 | HR 54 | Temp 98.2°F | Ht 72.0 in | Wt 181.4 lb

## 2022-08-14 DIAGNOSIS — N184 Chronic kidney disease, stage 4 (severe): Secondary | ICD-10-CM

## 2022-08-14 DIAGNOSIS — I482 Chronic atrial fibrillation, unspecified: Secondary | ICD-10-CM

## 2022-08-14 DIAGNOSIS — I2510 Atherosclerotic heart disease of native coronary artery without angina pectoris: Secondary | ICD-10-CM | POA: Diagnosis not present

## 2022-08-14 DIAGNOSIS — I10 Essential (primary) hypertension: Secondary | ICD-10-CM

## 2022-08-14 DIAGNOSIS — I5032 Chronic diastolic (congestive) heart failure: Secondary | ICD-10-CM

## 2022-08-14 DIAGNOSIS — E785 Hyperlipidemia, unspecified: Secondary | ICD-10-CM

## 2022-08-14 MED ORDER — AMLODIPINE BESYLATE 5 MG PO TABS: ORAL_TABLET | ORAL | 3 refills | Status: DC

## 2022-08-14 MED ORDER — DOXAZOSIN MESYLATE 4 MG PO TABS: 4.0000 mg | ORAL_TABLET | Freq: Every day | ORAL | 3 refills | Status: DC

## 2022-08-14 MED ORDER — ROSUVASTATIN CALCIUM 20 MG PO TABS: ORAL_TABLET | ORAL | 3 refills | Status: DC

## 2022-08-14 MED ORDER — FUROSEMIDE 40 MG PO TABS: 40.0000 mg | ORAL_TABLET | Freq: Two times a day (BID) | ORAL | 3 refills | Status: DC

## 2022-08-14 NOTE — Patient Instructions (Addendum)
Let us know if you get any COVID vaccines this fall or your [facility] vaccine at the pharmacy.  No changes today- blood pressure much improved  Recommended follow up: Return for next already scheduled visit or sooner if needed.

## 2022-08-14 NOTE — Progress Notes (Signed)
Phone [phone] In person visit   Subjective:   [name] is a 89 y.o. year old very pleasant male patient who presents for/with See problem oriented charting Chief Complaint  Patient presents with   f/u    Pt is here for 1 month follow up on afib, pt daughter states pt has not been looking as If he doesn't feel good and something is "off"   Past Medical History-  Patient Active Problem List   Diagnosis Date Noted   Heart failure with preserved ejection fraction (HCC) 06/14/2021    Priority: High   CKD (chronic kidney disease), stage IV (HCC) 11/22/2015    Priority: High   Memory loss 07/21/2014    Priority: High   Chronic atrial fibrillation (HCC) 07/08/2011    Priority: High   Hemiparesis affecting right side as late effect of cerebrovascular accident (HCC) 03/26/2007    Priority: High   CAD (coronary artery disease) 08/23/2006    Priority: High   Macular degeneration 01/21/2020    Priority: Medium    Venous insufficiency 10/02/2016    Priority: Medium    Right hydrocele 04/03/2016    Priority: Medium    Thrombocytopenia (HCC) 11/22/2015    Priority: Medium    Bradycardia 07/09/2011    Priority: Medium    Major depression in remission (HCC) 07/26/2007    Priority: Medium    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    Priority: Medium    Hyperlipidemia 08/23/2006    Priority: Medium    Essential hypertension 08/23/2006    Priority: Medium    Disorder of bone and cartilage 06/14/2021    Priority: Low   Disorder of gallbladder 06/14/2021    Priority: Low   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     ELIQUIS 2.5 MG TABS tablet TAKE 1 TABLET(2.5 MG) BY MOUTH TWICE DAILY 60 tablet 11   escitalopram (LEXAPRO) 20 MG tablet Take 1 tablet (20 mg total) by mouth daily. 90 tablet 1   Ferrous Sulfate (IRON PO) Take 1 tablet by mouth daily.     Lansoprazole (PREVACID PO) Take 15 mg by mouth daily.     memantine (NAMENDA) 10 MG tablet Take 1 tablet (10 mg total) by mouth daily. 90 tablet 3   Multiple Vitamin (MULTIVITAMIN) tablet Take 1 tablet by mouth daily.     rosuvastatin (CRESTOR) 20 MG tablet TAKE 1 TABLET BY MOUTH ONCE DAILY 7 tablet 10   amLODipine (NORVASC) 5 MG tablet TAKE 1 TABLET BY MOUTH ONCE DAILY 90 tablet 3   doxazosin (CARDURA) 4 MG tablet Take 1 tablet (4 mg total) by mouth daily. 90 tablet 3   furosemide (LASIX) 40 MG tablet Take 1 tablet (40 mg total) by mouth 2 (two) times daily. Take 1 and 1/2 tablets daily. 180 tablet 3   No current facility-administered medications for this visit.     Objective:  BP (!) 142/62   Pulse (!) 54   Temp 98.2 F (36.8 C)   Ht 6' (1.829 m)  Wt 181 lb 6.4 oz (82.3 kg)   SpO2 97%   BMI 24.60 kg/m  Gen: NAD, resting comfortably CV: RRR no murmurs rubs or gallops Lungs: CTAB no crackles, wheeze, rhonchi Ext: 1+ edema Skin: warm, dry     Assessment and Plan   #social update- lost his wife in December 2023- had just celebrated 70 years. Some concern that with loss of wife that he has unexpressed grief.  - most of time when aide gets to independent living he is dressed. Puts himself to bed. Support 9-5 pm with aide.   # History of stroke-hemiparesis of right side as late effect of stroke.  Also substantial garbled speech.  Has been on memantine since before I was his physician.    #CKD IV- follows with Dr. Oleh Genin polycystic disease, cortical atrophy, significant lower extremity edema (per Dr. Ronalee Belts likely lymphedema after CABG) per baseline (prior NSAID  use) #hypertension S: medication: Amlodipine 5 mg, Lasix 60 mg- 40 mg twice daily after recent visit due to edema from Dr. Ronalee Belts, doxazosin 4 mg - apparently in last few weeks seems somewhat slower or more cautious with steps but hard to get more information   Home readings #s: no recent checks BP Readings from Last 3 Encounters:  08/14/22 (!) 142/62  07/12/22 (!) 156/62  05/08/22 (!) 154/70  A/P: kidney function overall stable- continue current medications  Blood pressure much improved - we will tolerate up to 150- Dr. Ronalee Belts was ok with 140 at last visit with him as well   # Chronic atrial fibrillation S: Rate controlled without medication  Anticoagulated with Eliquis 2.5 mg twice daily A/P: appropriately anticoagulated and rate controlled (no medications)- continue current medicine   # Chronic heart failure with preserved ejection fraction S: Medication: Lasix 40 mg twice daily. Seems to be slightly more winded. No increased swelling. Weight within a pounds of last visit A/P: overall stable likely but monitor with shortness of breath and edema- continue current medications for now  . Edema seems slightly worse- follow up if worsening sooner than 2 months  # CAD  #hyperlipidemia S: Medication:Not on aspirin as result of Eliquis, rosuvastatin 20 mg Symptoms:no chest pain- possible mild shortness of breath   Lab Results  Component Value Date   CHOL 128 04/17/2022   HDL 57.50 04/17/2022   LDLCALC 52 04/17/2022   LDLDIRECT 66.0 06/01/2016   TRIG 92.0 04/17/2022   CHOLHDL 2 04/17/2022   A/P: CAD noted- appears asymptomatic but hard to get full information Lipids at goal- continue current medications    # Depression S: Medication:Lexapro 20 mg -Difficult to fully assess due to garbled speech    08/14/2022   11:10 AM 07/13/2022    3:44 PM 04/17/2022    2:21 PM  Depression screen PHQ 2/9  Decreased Interest 0 0   Down, Depressed, Hopeless 0 1 1  PHQ - 2 Score 0 1 1   Altered sleeping 0  0  Tired, decreased energy 0  2  Change in appetite 0  0  Feeling bad or failure about yourself  0  0  Trouble concentrating 0  0  Moving slowly or fidgety/restless 0  0  Suicidal thoughts 0  0  PHQ-9 Score 0  3  Difficult doing work/chores Not difficult at all  Not difficult at all  A/P: I do think mourning is playing a role in how he is feeling- continue to monitor    # BPH with obstruction/luts-doxazosin trial 2024-prior Flomax  Recommended follow up: Return for next already scheduled visit or sooner if needed. Future Appointments  Date Time Provider Department Center  10/16/2022 11:00 AM Shelva Majestic, MD [facility]   Lab/Order associations:   ICD-10-CM   1. Coronary artery disease involving native coronary artery of native heart without angina pectoris  I25.10     2. Chronic atrial fibrillation (HCC)  I48.20     3. Chronic heart failure with preserved ejection fraction (HCC)  I50.32     4. CKD (chronic kidney disease), stage IV (HCC)  N18.4     5. Essential hypertension  I10     6. Hyperlipidemia, unspecified hyperlipidemia type  E78.5       Meds ordered this encounter  Medications   doxazosin (CARDURA) 4 MG tablet    Sig: Take 1 tablet (4 mg total) by mouth daily.    Dispense:  90 tablet    Refill:  3    Please make sure not on tamsulosin   amLODipine (NORVASC) 5 MG tablet    Sig: TAKE 1 TABLET BY MOUTH ONCE DAILY    Dispense:  90 tablet    Refill:  3   furosemide (LASIX) 40 MG tablet    Sig: Take 1 tablet (40 mg total) by mouth 2 (two) times daily. Take 1 and 1/2 tablets daily.    Dispense:  180 tablet    Refill:  3    Return precautions advised.  Tana Conch, MD

## 2022-08-15 ENCOUNTER — Telehealth: Payer: Self-pay | Admitting: Family Medicine

## 2022-08-15 ENCOUNTER — Other Ambulatory Visit: Payer: Self-pay | Admitting: Family Medicine

## 2022-08-15 MED ORDER — FUROSEMIDE 40 MG PO TABS: 40.0000 mg | ORAL_TABLET | Freq: Two times a day (BID) | ORAL | 3 refills | Status: DC

## 2022-08-15 NOTE — Telephone Encounter (Signed)
Edited Rx has been sent to pharmacy.

## 2022-08-15 NOTE — Telephone Encounter (Signed)
Caller states she called yesterday for clarification of lasix but was informed nurse was @ lunch. Caller requests clarification on lasix dosage today if possible.

## 2022-08-16 ENCOUNTER — Other Ambulatory Visit: Payer: Self-pay

## 2022-08-16 MED ORDER — APIXABAN 2.5 MG PO TABS: 2.5000 mg | ORAL_TABLET | Freq: Two times a day (BID) | ORAL | 3 refills | Status: DC

## 2022-08-16 MED ORDER — ESCITALOPRAM OXALATE 20 MG PO TABS: 20.0000 mg | ORAL_TABLET | Freq: Every day | ORAL | 11 refills | Status: DC

## 2022-09-27 ENCOUNTER — Encounter (INDEPENDENT_AMBULATORY_CARE_PROVIDER_SITE_OTHER): Payer: Self-pay

## 2022-10-16 ENCOUNTER — Encounter: Payer: Self-pay | Admitting: Family Medicine

## 2022-10-16 ENCOUNTER — Ambulatory Visit: Payer: Medicare Other | Admitting: Family Medicine

## 2022-10-16 VITALS — BP 140/62 | HR 90 | Temp 98.1°F | Ht 72.0 in | Wt 176.2 lb

## 2022-10-16 DIAGNOSIS — I482 Chronic atrial fibrillation, unspecified: Secondary | ICD-10-CM

## 2022-10-16 DIAGNOSIS — E785 Hyperlipidemia, unspecified: Secondary | ICD-10-CM

## 2022-10-16 DIAGNOSIS — N184 Chronic kidney disease, stage 4 (severe): Secondary | ICD-10-CM

## 2022-10-16 DIAGNOSIS — I10 Essential (primary) hypertension: Secondary | ICD-10-CM

## 2022-10-16 DIAGNOSIS — I2510 Atherosclerotic heart disease of native coronary artery without angina pectoris: Secondary | ICD-10-CM | POA: Diagnosis not present

## 2022-10-16 DIAGNOSIS — I5032 Chronic diastolic (congestive) heart failure: Secondary | ICD-10-CM

## 2022-10-16 LAB — RENAL FUNCTION PANEL
Albumin: 3.8 g/dL (ref 3.5–5.2)
BUN: 50 mg/dL — ABNORMAL HIGH (ref 6–23)
CO2: 23 mEq/L (ref 19–32)
Calcium: 8.5 mg/dL (ref 8.4–10.5)
Chloride: 109 mEq/L (ref 96–112)
Creatinine, Ser: 2.96 mg/dL — ABNORMAL HIGH (ref 0.40–1.50)
GFR: 17.83 mL/min — ABNORMAL LOW (ref >60.00)
Glucose, Bld: 170 mg/dL — ABNORMAL HIGH (ref 70–99)
Phosphorus: 4.5 mg/dL (ref 2.3–4.6)
Potassium: 3.7 mEq/L (ref 3.5–5.1)
Sodium: 142 mEq/L (ref 135–145)

## 2022-10-16 LAB — CBC WITH DIFFERENTIAL/PLATELET
Basophils Absolute: 0 10*3/uL (ref 0.0–0.1)
Basophils Relative: 0.9 % (ref 0.0–3.0)
Eosinophils Absolute: 0.1 10*3/uL (ref 0.0–0.7)
Eosinophils Relative: 3.7 % (ref 0.0–5.0)
HCT: 27.7 % — ABNORMAL LOW (ref 39.0–52.0)
Hemoglobin: 9 g/dL — ABNORMAL LOW (ref 13.0–17.0)
Lymphocytes Relative: 13.8 % (ref 12.0–46.0)
Lymphs Abs: 0.5 10*3/uL — ABNORMAL LOW (ref 0.7–4.0)
MCHC: 32.7 g/dL (ref 30.0–36.0)
MCV: 97.3 fl (ref 78.0–100.0)
Monocytes Absolute: 0.5 10*3/uL (ref 0.1–1.0)
Monocytes Relative: 12.3 % — ABNORMAL HIGH (ref 3.0–12.0)
Neutro Abs: 2.6 10*3/uL (ref 1.4–7.7)
Neutrophils Relative %: 69.3 % (ref 43.0–77.0)
Platelets: 142 10*3/uL — ABNORMAL LOW (ref 150.0–400.0)
RBC: 2.84 Mil/uL — ABNORMAL LOW (ref 4.22–5.81)
RDW: 16.1 % — ABNORMAL HIGH (ref 11.5–15.5)
WBC: 3.8 10*3/uL — ABNORMAL LOW (ref 4.0–10.5)

## 2022-10-16 LAB — HEPATIC FUNCTION PANEL
ALT: 24 U/L (ref 0–53)
AST: 20 U/L (ref 0–37)
Albumin: 3.8 g/dL (ref 3.5–5.2)
Alkaline Phosphatase: 144 U/L — ABNORMAL HIGH (ref 39–117)
Bilirubin, Direct: 0.1 mg/dL (ref 0.0–0.3)
Total Bilirubin: 0.4 mg/dL (ref 0.2–1.2)
Total Protein: 7 g/dL (ref 6.0–8.3)

## 2022-10-16 NOTE — Patient Instructions (Addendum)
Let us know if you get your flu or COVID vaccine this fall.  Schedule cardiology follow up   Please stop by lab before you go- we will try to forward to Dr. Ronalee Belts If you have mychart- we will send your results within 3 business days of [place] receiving them.  If you do not have mychart- we will call you about results within 5 business days of [place] receiving them.  *please also note that you will see labs on mychart as soon as they post. I will later go in and write notes on them- will say "notes from Dr. Durene Cal"   Recommended follow up: Return in about 3 months (around 01/16/2023) for followup or sooner if needed.Schedule b4 you leave.

## 2022-10-16 NOTE — Progress Notes (Signed)
Phone [phone] In person visit   Subjective:   [name] is a 89 y.o. year old very pleasant male patient who presents for/with See problem oriented charting Chief Complaint  Patient presents with   Medical Management of Chronic Issues   Hypertension   increased sleep   right hand    Can use right hand anymore    Past Medical History-  Patient Active Problem List   Diagnosis Date Noted   Heart failure with preserved ejection fraction (HCC) 06/14/2021    Priority: High   CKD (chronic kidney disease), stage IV (HCC) 11/22/2015    Priority: High   Memory loss 07/21/2014    Priority: High   Chronic atrial fibrillation (HCC) 07/08/2011    Priority: High   Hemiparesis affecting right side as late effect of cerebrovascular accident (HCC) 03/26/2007    Priority: High   CAD (coronary artery disease) 08/23/2006    Priority: High   Macular degeneration 01/21/2020    Priority: Medium    Venous insufficiency 10/02/2016    Priority: Medium    Right hydrocele 04/03/2016    Priority: Medium    Thrombocytopenia (HCC) 11/22/2015    Priority: Medium    Bradycardia 07/09/2011    Priority: Medium    Major depression in remission (HCC) 07/26/2007    Priority: Medium    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    Priority: Medium    Hyperlipidemia 08/23/2006    Priority: Medium    Essential hypertension 08/23/2006    Priority: Medium    Disorder of bone and cartilage 06/14/2021    Priority: Low   Disorder of gallbladder 06/14/2021    Priority: Low   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 60 tablet 3   Calcium Carb-Cholecalciferol (CALCIUM 500 + D3 PO) Take by mouth.     doxazosin (CARDURA) 4 MG tablet Take 1 tablet (4 mg total) by mouth daily. 90 tablet 3   escitalopram (LEXAPRO) 20 MG tablet Take 1 tablet (20 mg total) by mouth daily. 30 tablet 11   Ferrous Sulfate (IRON PO) Take 1 tablet by mouth daily.     furosemide (LASIX) 40 MG tablet Take 1 tablet (40 mg total) by mouth 2 (two) times daily. 180 tablet 3   Lansoprazole (PREVACID PO) Take 15 mg by mouth daily.     memantine (NAMENDA) 10 MG tablet Take 1 tablet (10 mg total) by mouth daily. 90 tablet 3   Multiple Vitamin (MULTIVITAMIN) tablet Take 1 tablet by mouth daily.     rosuvastatin (CRESTOR) 20 MG tablet TAKE 1 TABLET BY MOUTH ONCE DAILY 90 tablet 3   No current facility-administered medications for this visit.     Objective:  BP (!) 140/62   Pulse 90   Temp 98.1 F (36.7 C)   Ht 6' (1.829 m)   Wt 176 lb 3.2 oz (79.9 kg)  SpO2 96%   BMI 23.90 kg/m  Gen: NAD, resting comfortably CV: irregularly irregular no murmurs rubs or gallops Lungs: CTAB no crackles, wheeze, rhonchi Abdomen: soft/nontender/nondistended/normal bowel sounds. No rebound or guarding.  Ext: trace to 1+ edema, some superficial abrasions that have scabbed over Skin: warm, dry Neuro: right sided weakness noted- grip 4-/5 on right for instanc    Assessment and Plan   # History of stroke-hemiparesis of right side as late effect of stroke.  Also substantial garbled speech.  Has been on memantine since before I was his physician.  -ongoing hemiparesis- has hard time grabbing bills with right hand now.  Also with weakness in right leg- offered repeat scan we jointly agreed to hold off.  -family has noted sleeping more and less active in general as time goes on- still lost wife December 2023-  had been married 70 years.  -still adie 9-5 PM -still bathing himself daily and clothing himself and feeds himself if meal prepared  #CKD IV- follows with Dr. Ronalee Belts (sees next week)-known polycystic disease, cortical atrophy, significant lower extremity edema (per Dr. Ronalee Belts likely lymphedema after CABG) per baseline (prior NSAID use) #hypertension S: medication: Amlodipine 5 mg, Lasix 40 mg twice daily, doxazosin 4 mg -If renal function worsened would consider hospice- no dialysis  Home readings #s: not checking lately BP Readings from Last 3 Encounters:  10/16/22 (!) 140/62  08/14/22 (!) 142/62  07/12/22 (!) 156/62  A/P: blood pressure reasonably controlled when factoring in age and living alone. Hesitant to increase dose and worsen fall risk.  CKD IV- update labs today   # Chronic atrial fibrillation - plans to schedule  S: Rate controlled without medication  Anticoagulated with Eliquis 2.5 mg twice daily A/P: appropriately anticoagulated and rate controlled (even without medicine)-  continue current medicine     # Chronic heart failure with preserved ejection fraction S: Medication: Lasix 40 mg twice daily  A/P: edema looking better overall- weight down 5 lbs- appears in better fluid status position- continue current medications    # CAD  #hyperlipidemia S: Medication:Not on aspirin as result of Eliquis, rosuvastatin20 mg Symptoms:no known complaints of chest pain or shortness of breath -  may be difficult for him to express though Lab Results  Component Value Date   CHOL 128 04/17/2022   HDL 57.50 04/17/2022   LDLCALC 52 04/17/2022   LDLDIRECT 66.0 06/01/2016   TRIG 92.0 04/17/2022   CHOLHDL 2 04/17/2022   A/P: coronary artery disease asymptomatic as far as we are aware. Lipids at goal- continue current medications    # Depression S: Medication:Lexapro 20 mg. Also still mourning loss of wife- increased sleepiness may be sign of this -Difficult to fully assess due to  garbled speech A/P: depression hopefully controlled- continue current medications    # BPH with obstruction/luts-doxazosin trial 2024-prior Flomax- control difficult to assess   # GERD-well-controlled on lansoprazole . He asked for refill from daughter today  Recommended follow up: Return in about 3 months (around 01/16/2023) for followup or sooner if needed.Schedule b4 you leave.  Lab/Order associations:   ICD-10-CM   1. Essential hypertension  I10 Hepatic function panel    CBC w/Diff    2. CKD (chronic kidney disease), stage IV (HCC)  N18.4 Renal Function Panel    3. Coronary artery disease involving native coronary artery of native heart without angina pectoris  I25.10     4. Chronic atrial fibrillation (HCC)  I48.20     5. Chronic  heart failure with preserved ejection fraction (HCC)  I50.32     6. Hyperlipidemia, unspecified hyperlipidemia type  E78.5       No orders of the defined types were placed in this encounter.   Return precautions advised.  Tana Conch, MD

## 2022-10-27 DIAGNOSIS — R609 Edema, unspecified: Secondary | ICD-10-CM | POA: Diagnosis not present

## 2022-10-27 DIAGNOSIS — I129 Hypertensive chronic kidney disease with stage 1 through stage 4 chronic kidney disease, or unspecified chronic kidney disease: Secondary | ICD-10-CM | POA: Diagnosis not present

## 2022-10-27 DIAGNOSIS — D631 Anemia in chronic kidney disease: Secondary | ICD-10-CM | POA: Diagnosis not present

## 2022-10-27 DIAGNOSIS — N184 Chronic kidney disease, stage 4 (severe): Secondary | ICD-10-CM | POA: Diagnosis not present

## 2022-11-21 ENCOUNTER — Telehealth (INDEPENDENT_AMBULATORY_CARE_PROVIDER_SITE_OTHER): Payer: Medicare Other | Admitting: Internal Medicine

## 2022-11-21 ENCOUNTER — Encounter: Payer: Self-pay | Admitting: Internal Medicine

## 2022-11-21 DIAGNOSIS — N184 Chronic kidney disease, stage 4 (severe): Secondary | ICD-10-CM | POA: Diagnosis not present

## 2022-11-21 DIAGNOSIS — U071 COVID-19: Secondary | ICD-10-CM | POA: Diagnosis not present

## 2022-11-21 DIAGNOSIS — I69351 Hemiplegia and hemiparesis following cerebral infarction affecting right dominant side: Secondary | ICD-10-CM | POA: Diagnosis not present

## 2022-11-21 MED ORDER — MOLNUPIRAVIR EUA 200MG CAPSULE: 4.0000 | ORAL_CAPSULE | Freq: Two times a day (BID) | ORAL | 0 refills | Status: AC

## 2022-11-21 NOTE — Progress Notes (Signed)
Virtual Visit via Video Note  I connected with [name] on 11/21/22 at  3:00 PM EDT by a video enabled telemedicine application and verified that I am speaking with the correct person using two identifiers. Location patient: home Location provider:work  office Persons participating in the virtual visit: patient, provider  Patient aware  of the limitations of evaluation and management by telemedicine and  availability of in person appointments. and agreed to proceed.   HPI: [name] presents for video visit with daughter   care taker is also present  [name] has dysphagia  Noted onset cough congestion maybe 3+ days ago  and on going without fever but could tell not feeling that well . No sig vd dyspnea   Had covid with family last year and rx with moupinivir and helped . Whole family had illness  Coughing in day  and congested no fever . No obv dyspnea   ROS: See pertinent positives and negatives per HPI.  Past Medical History:  Diagnosis Date   Actinic keratosis    BPH with urinary obstruction    Bradycardia    CAD (coronary artery disease)    CABG x5   Cerebrovascular accident (stroke) (HCC)    With right hemiparesis and persistent expressive aphasia   GERD (gastroesophageal reflux disease)    History of hydronephrosis    Hx of hydronephrosis 07/08/2011   right    Hyperlipidemia    Hypertension    Iron deficiency anemia due to chronic blood loss 03/26/2007   Now resolved     Iron deficiency anemia secondary to blood loss (chronic)    Persistent atrial fibrillation (HCC)    Pulmonary hypertension (HCC)    by echo 2013   Rosacea    Situational depression    Severe   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   treated after biopsy   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   [facility] - treated after biopsy   Tricuspid regurgitation     Past Surgical History:  Procedure Laterality Date   ABDOMINAL SURGERY     BACK  SURGERY     BAND HEMORRHOIDECTOMY     CHOLECYSTECTOMY     CORONARY ARTERY BYPASS GRAFT     x 5   dental implants     HERNIA REPAIR     KIDNEY SURGERY     stenosis with stent   TONSILLECTOMY      Family History  Problem Relation Age of Onset   Heart disease Father    Heart attack Other        fhx   Coronary artery disease Other        fhx    Social History   Tobacco Use   Smoking status: Former   Smokeless tobacco: Never   Tobacco comments:    x 89 yo per the dtr  Substance Use Topics   Alcohol use: Yes    Alcohol/week: 1.0 standard drink of alcohol    Types: 1 Glasses of wine per week    Comment: glass of wine at 7   Drug use: No      Current Outpatient Medications:    amLODipine (NORVASC) 5 MG tablet, TAKE 1 TABLET BY MOUTH ONCE DAILY, Disp: 90 tablet, Rfl: 3   apixaban (ELIQUIS) 2.5 MG TABS tablet, Take 1 tablet (2.5 mg total) by mouth 2 (two) times daily., Disp: 60 tablet, Rfl: 3   doxazosin (CARDURA) 4  MG tablet, Take 1 tablet (4 mg total) by mouth daily., Disp: 90 tablet, Rfl: 3   escitalopram (LEXAPRO) 20 MG tablet, Take 1 tablet (20 mg total) by mouth daily., Disp: 30 tablet, Rfl: 11   Ferrous Sulfate (IRON PO), Take 1 tablet by mouth daily., Disp: , Rfl:    furosemide (LASIX) 40 MG tablet, Take 1 tablet (40 mg total) by mouth 2 (two) times daily., Disp: 180 tablet, Rfl: 3   Lansoprazole (PREVACID PO), Take 15 mg by mouth daily., Disp: , Rfl:    memantine (NAMENDA) 10 MG tablet, Take 1 tablet (10 mg total) by mouth daily., Disp: 90 tablet, Rfl: 3   molnupiravir EUA (LAGEVRIO) 200 mg CAPS capsule, Take 4 capsules (800 mg total) by mouth 2 (two) times daily for 5 days., Disp: 40 capsule, Rfl: 0   rosuvastatin (CRESTOR) 20 MG tablet, TAKE 1 TABLET BY MOUTH ONCE DAILY, Disp: 90 tablet, Rfl: 3   Calcium Carb-Cholecalciferol (CALCIUM 500 + D3 PO), Take by mouth. (Patient not taking: Reported on 11/21/2022), Disp: , Rfl:    Multiple Vitamin (MULTIVITAMIN) tablet, Take  1 tablet by mouth daily. (Patient not taking: Reported on 11/21/2022), Disp: , Rfl:   EXAM: BP Readings from Last 3 Encounters:  10/16/22 (!) 140/62  08/14/22 (!) 142/62  07/12/22 (!) 156/62    VITALS per patient if applicable:  GENERAL: alert, dyphagia  no resp distress  non toxic  appears well and in no acute distress  HEENT: atraumatic, conjunttiva clear, no obvious abnormalities on inspection of external nose and ears  NECK: normal movements of the head and neck  LUNGS: on inspection no signs of respiratory distress, breathing rate appears normal, no obvious gross SOB, gasping or wheezing pulse ox 97 %  CV: no obvious cyanosis   PSYCH/NEURO: aphasiic responsive  and alert  Lab Results  Component Value Date   WBC 3.8 (L) 10/16/2022   HGB 9.0 (L) 10/16/2022   HCT 27.7 (L) 10/16/2022   PLT 142.0 (L) 10/16/2022   GLUCOSE 170 (H) 10/16/2022   CHOL 128 04/17/2022   TRIG 92.0 04/17/2022   HDL 57.50 04/17/2022   LDLDIRECT 66.0 06/01/2016   LDLCALC 52 04/17/2022   ALT 24 10/16/2022   AST 20 10/16/2022   NA 142 10/16/2022   K 3.7 10/16/2022   CL 109 10/16/2022   CREATININE 2.96 (H) 10/16/2022   BUN 50 (H) 10/16/2022   CO2 23 10/16/2022   TSH 3.88 05/08/2022   PSA 0.47 08/11/2013   INR 1.3 06/12/2008   HGBA1C 6.2 10/07/2012   MICROALBUR 215.0 (H) 05/13/2021    ASSESSMENT AND PLAN:  Discussed the following assessment and plan:    ICD-10-CM   1. COVID-19  U07.1    with cough and uri sx day 3-4    2. CKD (chronic kidney disease), stage IV (HCC)  N18.4     3. Hemiparesis affecting right side as late effect of cerebrovascular accident [facility])  [phone]      High risk by age and condition but gfr is est below 20  so not a cndidate for  recommended paxlovid  unless out of  indications  dosing  Will rx with Molnupiravir even though data not compelling seemed to help last time . Supportive care  follow for alarm complication sx .  Counseled.   Expectant management and  discussion of plan and treatment with opportunity to ask questions and all were answered. The patient agreed with the plan and demonstrated an understanding of  the instructions.   Advised to call back or seek an in-person evaluation if worsening  or having  further concerns  in interim. Record revewi counsel about condition and interventions  25 minutes  Return if symptoms worsen or fail to improve as expected.    Berniece Andreas, MD  From UTD   Observational studies have described safe use of reduced doses (eg, nirmatrelvir 300 mg-ritonavir 100 mg once on day 1 followed by nirmatrelvir 150 mg-ritonavir 100 mg once daily for the next 4 days) in patients with eGFR <30 mL/min, including those on dialysis. Refer to other UpToDate content for details on potential dosing in such situations.

## 2022-11-22 ENCOUNTER — Telehealth: Payer: Self-pay | Admitting: Family Medicine

## 2022-11-22 NOTE — Telephone Encounter (Signed)
Please see if angela can help with this on an urgent basis . Thanks

## 2022-11-22 NOTE — Telephone Encounter (Signed)
Pt daughter call and stated the medication dr.Panosh prescribe for covid  for  pt cost 200.00 and want to know should she get it or is their something else he can get.pt daughter want a call back.

## 2022-11-22 NOTE — Telephone Encounter (Signed)
Spoke with daughter [name]. No better cost alternative or method to get cost down. Discussed that patient is on day 4 of symptoms and lack of data showing much benefit past 48 hours of symptom onset.  Reports patient is starting to feel better already, staying hydrated and eating. Will not pick up prescription at this time.  Sherrill Raring, PharmD Clinical Pharmacist [phone]

## 2022-12-20 ENCOUNTER — Telehealth: Payer: Self-pay | Admitting: Family Medicine

## 2022-12-20 NOTE — Telephone Encounter (Signed)
[employer] called and states - Needs 24-7 care at an Assisted Living because he is declining, living conditions are poor and poor hygiene, urinating on rug and cognitive decline. They will send orders to be signed.   Any questions please call [phone] - Kyla Balzarine

## 2022-12-25 NOTE — Telephone Encounter (Signed)
Awaiting orders

## 2023-01-18 ENCOUNTER — Telehealth: Payer: Self-pay | Admitting: Family Medicine

## 2023-01-18 ENCOUNTER — Encounter: Payer: Self-pay | Admitting: Family Medicine

## 2023-01-18 ENCOUNTER — Ambulatory Visit: Payer: Medicare Other | Admitting: Family Medicine

## 2023-01-18 VITALS — BP 120/64 | HR 58 | Temp 97.8°F | Ht 72.0 in | Wt 178.0 lb

## 2023-01-18 DIAGNOSIS — E785 Hyperlipidemia, unspecified: Secondary | ICD-10-CM | POA: Diagnosis not present

## 2023-01-18 DIAGNOSIS — I69351 Hemiplegia and hemiparesis following cerebral infarction affecting right dominant side: Secondary | ICD-10-CM | POA: Diagnosis not present

## 2023-01-18 DIAGNOSIS — I482 Chronic atrial fibrillation, unspecified: Secondary | ICD-10-CM

## 2023-01-18 DIAGNOSIS — N184 Chronic kidney disease, stage 4 (severe): Secondary | ICD-10-CM | POA: Diagnosis not present

## 2023-01-18 DIAGNOSIS — I2510 Atherosclerotic heart disease of native coronary artery without angina pectoris: Secondary | ICD-10-CM

## 2023-01-18 DIAGNOSIS — I5032 Chronic diastolic (congestive) heart failure: Secondary | ICD-10-CM

## 2023-01-18 DIAGNOSIS — I10 Essential (primary) hypertension: Secondary | ICD-10-CM | POA: Diagnosis not present

## 2023-01-18 NOTE — Telephone Encounter (Addendum)
Patient daughter requests to be called re: [name] is concerned that [name] at [place] will suspect that [name] forged/altered information on the AVS from [facility] 01/18/23

## 2023-01-18 NOTE — Progress Notes (Signed)
Phone [phone] In person visit   Subjective:   [name] is a 89 y.o. year old very pleasant male patient who presents for/with See problem oriented charting Chief Complaint  Patient presents with   Medical Management of Chronic Issues   Hypertension    Past Medical History-  Patient Active Problem List   Diagnosis Date Noted   Heart failure with preserved ejection fraction (HCC) 06/14/2021    Priority: High   CKD (chronic kidney disease), stage IV (HCC) 11/22/2015    Priority: High   Memory loss 07/21/2014    Priority: High   Chronic atrial fibrillation (HCC) 07/08/2011    Priority: High   Hemiparesis affecting right side as late effect of cerebrovascular accident (HCC) 03/26/2007    Priority: High   CAD (coronary artery disease) 08/23/2006    Priority: High   Macular degeneration 01/21/2020    Priority: Medium    Venous insufficiency 10/02/2016    Priority: Medium    Right hydrocele 04/03/2016    Priority: Medium    Thrombocytopenia (HCC) 11/22/2015    Priority: Medium    Bradycardia 07/09/2011    Priority: Medium    Major depression in remission (HCC) 07/26/2007    Priority: Medium    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    Priority: Medium    Hyperlipidemia 08/23/2006    Priority: Medium    Essential hypertension 08/23/2006    Priority: Medium    Disorder of bone and cartilage 06/14/2021    Priority: Low   Disorder of gallbladder 06/14/2021    Priority: Low   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 60 tablet 3   Calcium Carb-Cholecalciferol (CALCIUM 500 + D3 PO) Take by mouth.     doxazosin (CARDURA) 4 MG tablet Take 1 tablet (4 mg total) by mouth daily. 90 tablet 3   escitalopram (LEXAPRO) 20 MG tablet Take 1 tablet (20 mg total) by mouth daily. 30 tablet 11   Ferrous Sulfate (IRON PO) Take 1 tablet by mouth daily.     furosemide (LASIX) 40 MG tablet Take 1 tablet (40 mg total) by mouth 2 (two) times daily. 180 tablet 3   Lansoprazole (PREVACID PO) Take 15 mg by mouth daily.     memantine (NAMENDA) 10 MG tablet Take 1 tablet (10 mg total) by mouth daily. 90 tablet 3   Multiple Vitamin (MULTIVITAMIN) tablet Take 1 tablet by mouth daily.     rosuvastatin (CRESTOR) 20 MG tablet TAKE 1 TABLET BY MOUTH ONCE DAILY 90 tablet 3   No current facility-administered medications for this visit.     Objective:  BP 120/64   Pulse (!) 58   Temp 97.8 F (36.6 C)   Ht 6' (1.829 m)   Wt 178 lb (80.7 kg)   SpO2 98%   BMI 24.14 kg/m  Gen: NAD, resting comfortably CV: irregularly irregular no murmurs  rubs or gallops Lungs: CTAB no crackles, wheeze, rhonchi Abdomen: soft/nontender/nondistended/normal bowel sounds. No rebound or guarding.  Ext: 1+ edema Skin: warm, dry Neuro: grossly normal, moves all extremities      Assessment and Plan   # History of stroke-hemiparesis of right side as late effect of stroke.  Also substantial garbled speech.  Has been on memantine since before I was his physician S: -From 12/20/2022 phone note "[employer] called and states - Needs 24-7 care at an Assisted Living because he is declining, living conditions are poor and poor hygiene, urinating on rug and cognitive decline. They will send orders to be signed."  -we are not aware of aides being sent -from daughters perspective that was a fill in aide (apparently who was allergic to his cat) and they have  had several meetings since then - there are some spots on carpet but this is from where his legs drained previously (denies urine stains)- they are going to get different floors put down that are more easily cleaned -may have spot of urine on his pants at times per daughter but never soaked or running down legs - she has not noted further cognitive decline - he has not fallen, he is still eating regular meals, no clear safety concerns -for moving he would lose his cat (which is very important for him for quality of life)  -still has aides 9-5 MF and daughter there every other day if not more -hasn't even been a year since losing his wife -they have considered 5-9 pm aide but he didn't feel comfortable last time- they are trying one more time to see if better fit - aide from 9-5 pm and 9-2 on weekends - is dressed by time she gets there but she orders dinner and leaves pills out- he's in bed by 8 A/P: history of stroke with hemiparesis- patient has continued to do incredibly well in this setting with support of daughter and aides M-F 9-5 and 9-2 on weekends. I see no obvious safety concerns at present- no falls, no injuries and family is working to address the prior damage from his medical issues (leg drainage from venous insufficiency  and venous stasis changes) -I am supportive of him maintaining current residence as long as no safety concerns develop -the call mentioned worsening cognitive decline but his cognition shows no obvious change from even the first visit I had with him in 2016. For instance he still to this day he gets his tax papers together and gives to his daughter to submit.  - may maintain memantine which he has been on since before I started caring for him -focusing on quality of life has always been important to patient and family - strong preference to age in place -there was concern about access to the house but the daughter has a key in an available spot- occasionally when a new  caregiver comes he may not hear them knocking. Advised hearing evaluation but he has had in past and had hearing aide but does not want to use.   #CKD IV- follows with Dr. Oleh Genin polycystic disease, cortical atrophy, significant lower extremity edema (per Dr. Ronalee Belts likely lymphedema after CABG) per baseline (prior NSAID use) #hypertension S: medication: Amlodipine 5 mg, Lasix 40 mg twice daily, doxazosin 4 mg -If renal function worsened could consider hospice but has been stable lately  BP Readings from Last 3 Encounters:  01/18/23 120/64  10/16/22 (!) 140/62  08/14/22 (!) 142/62  A/P: chronic kidney  disease IV stable on most recent check with [facility] Hypertension stable- continue current medicines - looks better than last few checks -does have anemia of chronic disease but was slightly better when he saw Dr. Ronalee Belts then our last check down to 9    # Chronic atrial fibrillation S: Rate controlled without medication  Anticoagulated with Eliquis 2.5 mg twice daily A/P: appropriately anticoagulated. Rate controlled without medications. Continue current medications     # Chronic heart failure with preserved ejection fraction S: Medication: Lasix 40 mg twice daily  A/P: weight up 2 lbs but edema roughly stable- suspect euvolemic- continue current medications  # CAD  #hyperlipidemia S: Medication:Not on aspirin as result of Eliquis, rosuvastatin20 mg Symptoms:no reported chest pain or shortness of breath but this is limited by speech  Lab Results  Component Value Date   CHOL 128 04/17/2022   HDL 57.50 04/17/2022   LDLCALC 52 04/17/2022   LDLDIRECT 66.0 06/01/2016   TRIG 92.0 04/17/2022   CHOLHDL 2 04/17/2022   A/P: coronary artery disease appears asymptomatic continue current medications    # Depression S: Medication:Lexapro 20 mg -Difficult to fully assess due to garbled speech /P: appears well controlled but moving out of current home /change of life situation  coud exacerbate    #prior low white count- no worsening with Dr. Ronalee Belts- holding off on further evaluation.   Recommended follow up: Return in about 3 months (around 04/20/2023) for followup or sooner if needed.Schedule b4 you leave. Future Appointments  Date Time Provider Department Center  03/09/2023 11:00 AM Kathleene Hazel, MD [facility]  04/20/2023 11:00 AM Shelva Majestic, MD [facility]    Lab/Order associations:   ICD-10-CM   1. Essential hypertension  I10     2. Hyperlipidemia, unspecified hyperlipidemia type  E78.5     3. Hemiparesis affecting right side as late effect of cerebrovascular accident (HCC)  I69.351     4. CKD (chronic kidney disease), stage IV (HCC)  N18.4     5. Chronic heart failure with preserved ejection fraction (HCC)  I50.32     6. Coronary artery disease involving native coronary artery of native heart without angina pectoris  I25.10     7. Chronic atrial fibrillation (HCC)  I48.20       No orders of the defined types were placed in this encounter.   Return precautions advised.  Tana Conch, MD

## 2023-01-18 NOTE — Patient Instructions (Addendum)
Labs next visit  Let me know how I can help but here is my assessment   history of stroke with right hemiparesis- patient has continued to do incredibly well in this setting with support of daughter and aides M-F 9-5 and 9-2 on weekends. I see no obvious safety concerns at present- no falls, no injuries and family is working to address the prior damage to property such as rug from his medical issues (leg drainage from venous insufficiency and venous stasis changes) -I am supportive of him maintaining current residence as long as no safety concerns develop -the call from [facility] mentioned worsening cognitive decline but his cognition shows no obvious change from even the first visit I had with him in 2016. For instance he still to this day he gets his tax papers together and gives to his daughter to submit.  - may maintain memantine which he has been on since before I started caring for him -focusing on quality of life has always been important to patient and family - strong preference to age in place  Recommended follow up: Return in about 3 months (around 04/20/2023) for followup or sooner if needed.Schedule b4 you leave.

## 2023-01-19 NOTE — Telephone Encounter (Signed)
Called and lm for [employer].

## 2023-01-22 NOTE — Telephone Encounter (Signed)
Patient's daughter [name] returned call. Requests to be called.

## 2023-01-23 NOTE — Telephone Encounter (Signed)
Called and spoke with [name] and all questions answered. [name] will cb with the fax number to [facility].

## 2023-03-08 NOTE — Progress Notes (Signed)
 Chief Complaint  Patient presents with   Follow-up    CAD, atrial fibrillation   History of Present Illness: 89 yo male with history of BPH, CAD s/p CABG remotely, prior CVA, GERD, hyperlipidemia, HTN, iron deficiency anemia, CKD stage IV, persistent atrial fibrillation, depression here today for cardiac follow up. I saw him as a new patient in March 2023. He has been followed by Dr. Kelsie. Long standing history of persistent atrial fibrillation and has been on Eliquis . He has bradycardia and is off of all AV nodal blocking drugs. He is aphasic from a prior stroke. He is known to have CAD with 5V CABG in the mid 90s. He has chronic lower extremity edema felt to be due to venous insufficiency. This had worsened in March 2023 and when Dr. Katrinka saw him his BNP was elevated. His family found out that he had not been taking his Lasix  for two months. His Lasix  was restarted and his LE edema resolved.  He is here today for follow up. The patient denies any chest pain, dyspnea, palpitations, lower extremity edema, orthopnea, PND, dizziness, near syncope or syncope.   Primary Care Physician: Katrinka [name], MD  Past Medical History:  Diagnosis Date   Actinic keratosis    BPH with urinary obstruction    Bradycardia    CAD (coronary artery disease)    CABG x5   Cerebrovascular accident (stroke) [facility])    With right hemiparesis and persistent expressive aphasia   GERD (gastroesophageal reflux disease)    History of hydronephrosis    Hx of hydronephrosis 07/08/2011   right    Hyperlipidemia    Hypertension    Iron deficiency anemia due to chronic blood loss 03/26/2007   Now resolved     Iron deficiency anemia secondary to blood loss (chronic)    Persistent atrial fibrillation (HCC)    Pulmonary hypertension (HCC)    by echo 2013   Rosacea    Situational depression    Severe   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5 MG TABS tablet Take 1 tablet (2.5 mg total) by mouth 2 (two) times daily. 60 tablet 3   Calcium  Carb-Cholecalciferol (CALCIUM  500 + D3 PO) Take by mouth.     doxazosin  (CARDURA ) 4 MG tablet Take 1 tablet (4 mg total) by mouth daily. 90 tablet 3   escitalopram  (LEXAPRO ) 20 MG tablet Take 1 tablet (20 mg total) by mouth daily. 30 tablet 11   Ferrous Sulfate  (IRON PO) Take 1 tablet by mouth daily.     furosemide  (LASIX ) 40 MG tablet Take 1 tablet (40 mg total) by mouth 2 (two) times daily. 180 tablet 3   Lansoprazole (PREVACID  PO) Take 15 mg by mouth daily.     memantine  (NAMENDA ) 10 MG tablet Take 1 tablet (10 mg total) by mouth daily. 90 tablet 3   Multiple Vitamin (MULTIVITAMIN) tablet Take 1 tablet by mouth daily.     rosuvastatin  (CRESTOR ) 20 MG tablet TAKE 1 TABLET BY MOUTH ONCE DAILY 90 tablet 3   No current facility-administered medications for this visit.    Allergies  Allergen Reactions   Ciprofloxacin Hcl     unknown   Sulfamethoxazole-Trimethoprim     unknown    Social History   Socioeconomic History   Marital status: Widowed    Spouse name: Not on file   Number of children: Not on file   Years of education: Not on file   Highest education level: Not on file  Occupational History   Not on file  Tobacco Use   Smoking status: Former   Smokeless tobacco: Never   Tobacco comments:    x 89 yo per the dtr  Substance  and Sexual Activity   Alcohol  use: Yes    Alcohol /week: 1.0 standard drink of alcohol     Types: 1 Glasses of wine per week    Comment: glass of wine at 7   Drug use: No   Sexual activity: Not Currently  Other Topics Concern   Not on file  Social History Narrative   Aide 9-5   Widower. Lost spouse December 2023. Lives at a house at a retirement home- independent but they bring meals. 3 children. 7 grandkids. 8 greatgrandkids.       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 [employer]   Social Drivers of Health   Financial Resource Strain: Low Risk  (07/13/2022)   Overall Financial Resource Strain (CARDIA)    Difficulty of Paying Living Expenses: Not hard at all  Food Insecurity: No Food Insecurity (07/13/2022)   Hunger Vital Sign    Worried About Running Out of Food in the Last Year: Never true    Ran Out of Food in the Last Year: Never true  Transportation Needs: No Transportation Needs (07/13/2022)   PRAPARE - Administrator, Civil Service (Medical): No    Lack of Transportation (Non-Medical): No  Physical Activity: Inactive (07/13/2022)   Exercise Vital Sign    Days of Exercise per Week: 0 days    Minutes of Exercise per Session: 0 min  Stress: No Stress Concern Present (07/13/2022)   [employer] of Occupational Health - Occupational Stress Questionnaire    Feeling of Stress : Not at all  Social Connections: Socially Isolated (07/13/2022)   Social Connection and Isolation Panel [NHANES]    Frequency of Communication with Friends and Family: Never    Frequency of Social Gatherings with Friends and Family: More than three times a week    Attends Religious Services: Never    Database Administrator or Organizations: No    Attends Banker Meetings: Never    Marital Status: Widowed  Intimate Partner Violence: Not At Risk (07/13/2022)   Humiliation, Afraid, Rape, and Kick questionnaire    Fear of Current or  Ex-Partner: No    Emotionally Abused: No    Physically Abused: No    Sexually Abused: No    Family History  Problem Relation Age of Onset   Heart disease Father    Heart attack Other        fhx   Coronary artery  disease Other        fhx    Review of Systems:  As stated in the HPI and otherwise negative.   BP (!) 154/64 (BP Location: Right Arm, Patient Position: Sitting, Cuff Size: Normal)   Pulse (!) 44   Ht 6' (1.829 m)   Wt 81.9 kg   SpO2 98%   BMI 24.49 kg/m   Physical Examination: General: Well developed, well nourished, NAD  HEENT: OP clear, mucus membranes moist  SKIN: warm, dry. No rashes. Neuro: No focal deficits  Musculoskeletal: Muscle strength 5/5 all ext  Psychiatric: Mood and affect normal  Neck: No JVD, no carotid bruits, no thyromegaly, no lymphadenopathy.  Lungs:Clear bilaterally, no wheezes, rhonci, crackles Cardiovascular: Irregular, bradycardic. No murmurs, gallops or rubs. Abdomen:Soft. Bowel sounds present. Non-tender.  Extremities: No lower extremity edema. Pulses are 2 + in the bilateral DP/PT.  EKG:  EKG is ordered today. The ekg ordered today demonstrates  EKG Interpretation Date/Time:  Friday March 09 2023 11:26:23 EST Ventricular Rate:  44 PR Interval:    QRS Duration:  104 QT Interval:  532 QTC Calculation: 454 R Axis:   -35  Text Interpretation: Atrial fibrillation with slow ventricular response Left axis deviation Septal infarct (cited on or before 17-Jan-2022) Confirmed by Verlin Bruckner [phone]) on 03/09/2023 11:33:58 AM    Recent Labs: 05/08/2022: TSH 3.88 10/16/2022: ALT 24; BUN 50; Creatinine, Ser 2.96; Hemoglobin 9.0; Platelets 142.0; Potassium 3.7; Sodium 142   Lipid Panel    Component Value Date/Time   CHOL 128 04/17/2022 1525   TRIG 92.0 04/17/2022 1525   TRIG 41 12/29/2005 0930   HDL 57.50 04/17/2022 1525   CHOLHDL 2 04/17/2022 1525   VLDL 18.4 04/17/2022 1525   LDLCALC 52 04/17/2022 1525   LDLCALC 54  01/21/2020 1218   LDLDIRECT 66.0 06/01/2016 1136     Wt Readings from Last 3 Encounters:  03/09/23 81.9 kg  01/18/23 80.7 kg  10/16/22 79.9 kg    Assessment and Plan:   1. CAD s/p CABG without angina: No chest pain. He is not on an ASA since he is on Eliquis . No beta blocker secondary to long standing bradycardia. Continue statin.   2. Persistent atrial fib: Rate controlled atrial fib. Continue Eliquis   3. Chronic diastolic CHF:  Weight stable. No LE edema. Continue Lasix  40 mg po BID  Labs/ tests ordered today include:   Orders Placed This Encounter  Procedures   EKG 12-Lead   Disposition:   F/U with me 12 months.   Signed, Bruckner Verlin, MD 03/09/2023 11:55 AM    [facility] [address] [place], [place], [place]  [address] Phone: [phone]; Fax: [phone]

## 2023-03-09 ENCOUNTER — Encounter: Payer: Self-pay | Admitting: Cardiovascular Disease

## 2023-03-09 ENCOUNTER — Ambulatory Visit: Payer: Medicare Other | Attending: Cardiovascular Disease | Admitting: Cardiovascular Disease

## 2023-03-09 VITALS — BP 154/64 | HR 44 | Ht 72.0 in | Wt 180.6 lb

## 2023-03-09 DIAGNOSIS — I2510 Atherosclerotic heart disease of native coronary artery without angina pectoris: Secondary | ICD-10-CM | POA: Diagnosis not present

## 2023-03-09 DIAGNOSIS — I4821 Permanent atrial fibrillation: Secondary | ICD-10-CM | POA: Diagnosis not present

## 2023-03-09 DIAGNOSIS — I5032 Chronic diastolic (congestive) heart failure: Secondary | ICD-10-CM

## 2023-03-09 NOTE — Patient Instructions (Signed)
Medication Instructions:  No changes *If you need a refill on your cardiac medications before your next appointment, please call your pharmacy*   Lab Work: none If you have labs (blood work) drawn today and your tests are completely normal, you will receive your results only by: [place] (if you have MyChart) OR A paper copy in the mail If you have any lab test that is abnormal or we need to change your treatment, we will call you to review the results.   Testing/Procedures: none   Follow-Up: At [facility], you and your health needs are our priority.  As part of our continuing mission to provide you with exceptional heart care, we have created designated Provider Care Teams.  These Care Teams include your primary Cardiologist (physician) and Advanced Practice Providers (APPs -  Physician Assistants and Nurse Practitioners) who all work together to provide you with the care you need, when you need it.  We recommend signing up for the patient portal called "MyChart".  Sign up information is provided on this After Visit Summary.  MyChart is used to connect with patients for Virtual Visits (Telemedicine).  Patients are able to view lab/test results, encounter notes, upcoming appointments, etc.  Non-urgent messages can be sent to your provider as well.   To learn more about what you can do with MyChart, go to [email or web address].    Your next appointment:   12 month(s)  Provider:   Lauree Chandler, MD     Other Instructions

## 2023-03-20 ENCOUNTER — Telehealth: Payer: Self-pay

## 2023-03-20 NOTE — Telephone Encounter (Signed)
Called and lm on pt daughter vm that paperwork had been completed and it is upfront for pick up or tcb if she would like for me to place it in the mail.

## 2023-03-21 DIAGNOSIS — N2581 Secondary hyperparathyroidism of renal origin: Secondary | ICD-10-CM | POA: Diagnosis not present

## 2023-03-21 DIAGNOSIS — N184 Chronic kidney disease, stage 4 (severe): Secondary | ICD-10-CM | POA: Diagnosis not present

## 2023-03-21 DIAGNOSIS — R609 Edema, unspecified: Secondary | ICD-10-CM | POA: Diagnosis not present

## 2023-03-21 DIAGNOSIS — N189 Chronic kidney disease, unspecified: Secondary | ICD-10-CM | POA: Diagnosis not present

## 2023-03-21 DIAGNOSIS — I129 Hypertensive chronic kidney disease with stage 1 through stage 4 chronic kidney disease, or unspecified chronic kidney disease: Secondary | ICD-10-CM | POA: Diagnosis not present

## 2023-03-21 DIAGNOSIS — D631 Anemia in chronic kidney disease: Secondary | ICD-10-CM | POA: Diagnosis not present

## 2023-04-20 ENCOUNTER — Ambulatory Visit: Payer: Medicare Other | Admitting: Family Medicine

## 2023-04-20 ENCOUNTER — Other Ambulatory Visit: Payer: Self-pay

## 2023-04-20 MED ORDER — MEMANTINE HCL 10 MG PO TABS: 10.0000 mg | ORAL_TABLET | Freq: Every day | ORAL | 3 refills | Status: DC

## 2023-04-23 ENCOUNTER — Encounter: Payer: Self-pay | Admitting: Family Medicine

## 2023-04-23 ENCOUNTER — Ambulatory Visit (INDEPENDENT_AMBULATORY_CARE_PROVIDER_SITE_OTHER): Payer: Medicare Other | Admitting: Family Medicine

## 2023-04-23 VITALS — BP 140/72 | HR 55 | Temp 97.7°F | Ht 72.0 in | Wt 179.8 lb

## 2023-04-23 DIAGNOSIS — I10 Essential (primary) hypertension: Secondary | ICD-10-CM

## 2023-04-23 DIAGNOSIS — I2510 Atherosclerotic heart disease of native coronary artery without angina pectoris: Secondary | ICD-10-CM

## 2023-04-23 DIAGNOSIS — I5032 Chronic diastolic (congestive) heart failure: Secondary | ICD-10-CM

## 2023-04-23 DIAGNOSIS — I482 Chronic atrial fibrillation, unspecified: Secondary | ICD-10-CM

## 2023-04-23 DIAGNOSIS — E785 Hyperlipidemia, unspecified: Secondary | ICD-10-CM

## 2023-04-23 DIAGNOSIS — N184 Chronic kidney disease, stage 4 (severe): Secondary | ICD-10-CM

## 2023-04-23 DIAGNOSIS — I69351 Hemiplegia and hemiparesis following cerebral infarction affecting right dominant side: Secondary | ICD-10-CM | POA: Diagnosis not present

## 2023-04-23 DIAGNOSIS — F325 Major depressive disorder, single episode, in full remission: Secondary | ICD-10-CM

## 2023-04-23 NOTE — Addendum Note (Signed)
 Addended by: Shelva Majestic on: 04/23/2023 09:46 PM   Modules accepted: Level of Service

## 2023-04-23 NOTE — Patient Instructions (Addendum)
 No labs today- thanks for pointing out ones from [facility]  Glad you are doing reasonably well. No changes today other than try to prop up those legs  Recommended follow up: Return in about 4 months (around 08/21/2023) for physical or sooner if needed.Schedule b4 you leave.

## 2023-04-23 NOTE — Progress Notes (Signed)
 Phone [phone] In person visit   Subjective:   [name] is a 89 y.o. year old very pleasant male patient who presents for/with See problem oriented charting Chief Complaint  Patient presents with   Medical Management of Chronic Issues   Hypertension    Past Medical History-  Patient Active Problem List   Diagnosis Date Noted   Heart failure with preserved ejection fraction (HCC) 06/14/2021    Priority: High   CKD (chronic kidney disease), stage IV (HCC) 11/22/2015    Priority: High   Memory loss 07/21/2014    Priority: High   Chronic atrial fibrillation (HCC) 07/08/2011    Priority: High   Hemiparesis affecting right side as late effect of cerebrovascular accident (HCC) 03/26/2007    Priority: High   CAD (coronary artery disease) 08/23/2006    Priority: High   Macular degeneration 01/21/2020    Priority: Medium    Venous insufficiency 10/02/2016    Priority: Medium    Right hydrocele 04/03/2016    Priority: Medium    Thrombocytopenia (HCC) 11/22/2015    Priority: Medium    Bradycardia 07/09/2011    Priority: Medium    Major depression in remission (HCC) 07/26/2007    Priority: Medium    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    Priority: Medium    Hyperlipidemia 08/23/2006    Priority: Medium    Essential hypertension 08/23/2006    Priority: Medium    Disorder of bone and cartilage 06/14/2021    Priority: Low   Disorder of gallbladder 06/14/2021    Priority: Low   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 60 tablet 3   Calcium Carb-Cholecalciferol (CALCIUM 500 + D3 PO) Take by mouth.     doxazosin (CARDURA) 4 MG tablet Take 1 tablet (4 mg total) by mouth daily. 90 tablet 3   escitalopram (LEXAPRO) 20 MG tablet Take 1 tablet (20 mg total) by mouth daily. 30 tablet 11   Ferrous Sulfate (IRON PO) Take 1 tablet by mouth daily.     furosemide (LASIX) 40 MG tablet Take 1 tablet (40 mg total) by mouth 2 (two) times daily. 180 tablet 3   Lansoprazole (PREVACID PO) Take 15 mg by mouth daily.     memantine (NAMENDA) 10 MG tablet Take 1 tablet (10 mg total) by mouth daily. 90 tablet 3   Multiple Vitamin (MULTIVITAMIN) tablet Take 1 tablet by mouth daily.     rosuvastatin (CRESTOR) 20 MG tablet TAKE 1 TABLET BY MOUTH ONCE DAILY 90 tablet 3   No current facility-administered medications for this visit.     Objective:  BP (!) 140/72 Comment: no improvement on repeat  Pulse (!) 55   Temp 97.7 F (36.5 C)   Ht 6' (1.829 m)   Wt 179 lb 12.8 oz (81.6 kg)   SpO2 95%   BMI 24.39 kg/m  Gen: NAD,  resting comfortably CV: RRR no murmurs rubs or gallops Lungs: CTAB no crackles, wheeze, rhonchi Ext: 1+ edema with chonic venous stasis changes- no open wounds or drainage Skin: warm, dry     Assessment and Plan   #social update- had to put down cat- helped with mess on carpets - also got new floors put down so prior stains are gone - aide from 9-5 pm  - is dressed by time she gets there but she orders dinner and leaves pills out- he's in bed by 8. Prior 5-9 pm person he kicked out.  - lives at [facility] still -still cooking and no obvious safety concerns. Bacon in microwave and egg on stove usually.   # History of stroke-hemiparesis of right side as late effect of stroke.  Also substantial garbled speech.  Has been on memantine since before I was his physician- maintains on this as of now  #CKD IV-  follows with Dr. Ronalee Belts and seen recently with GFR in 15-20 range-known polycystic disease, cortical atrophy, significant lower extremity edema (per Dr. Ronalee Belts likely lymphedema after CABG) per baseline (prior NSAID use) #hypertension S: medication: Amlodipine 5 mg, Lasix 40 mg twice daily, doxazosin 4 mg -If renal function worsened would consider hospice- not interested in dialysis  -hemoglobin 9.3 due to CKD IV. Iron stores look adequate- may get ESA in long run BP Readings from Last 3 Encounters:  04/23/23 (!) 140/72  03/09/23 (!) 154/64  01/18/23 120/64  A/P: CKD IV reasonably stable lately though does have anemia of chronic disease/renal failure  -may get ESA In long run- could cause fatigue at 9.3 hemoglobin  Hypertension - discussed could add more medicine to bring this down some but with his age and living alone we opted to hold off on this- though there are some reisks   # Chronic atrial fibrillation S: Rate controlled without medication  Anticoagulated with Eliquis 2.5 mg twice daily. No falls A/P: .appropriately anticoagulated and rate controlled (no prescription for this)- continue current medicine   # Chronic heart failure with preserved ejection fraction S: Medication: Lasix 40 mg twice daily  A/P: overall stable- continue current medications - amlodipine contributes to edema but don't want to pull back as could make blood pressure higher   # CAD  #hyperlipidemia S: Medication:Not on aspirin as result of Eliquis, rosuvastatin 20 mg Symptoms:no reported chest pain or shortness of breath when asked but would be hard to communicate   A/P: coronary artery disease appears asymptomatic- continue current medications    # Depression S: Medication:Lexapro 20 mg -Difficult to fully assess due to garbled speech A/P: no obvious eorsening- continue current medications    # BPH with obstruction/luts-doxazosin trial 2024-prior Flomax- also helps blood pressure some    #  GERD-well-controlled on lansoprazole as needed   Recommended follow up: Return in about 4 months (around 08/21/2023) for physical or sooner if needed.Schedule b4 you leave.  Lab/Order associations:   ICD-10-CM   1. Coronary artery disease involving native coronary artery of native heart without angina pectoris  I25.10     2. Hemiparesis affecting right side as late effect of cerebrovascular accident (HCC)  I69.351     3. Chronic atrial fibrillation (HCC)  I48.20     4. CKD (chronic kidney disease), stage IV (HCC)  N18.4     5. Chronic heart failure with preserved ejection fraction (HCC)  I50.32     6. Hyperlipidemia, unspecified hyperlipidemia type  E78.5     7. Major  depression in remission (HCC)  F32.5     8. Essential hypertension  I10       No orders of the defined types were placed in this encounter.   Return precautions advised.  Tana Conch, MD

## 2023-06-25 ENCOUNTER — Inpatient Hospital Stay (HOSPITAL_COMMUNITY)
Admission: EM | Admit: 2023-06-25 | Discharge: 2023-06-27 | DRG: 291 | Disposition: A | Discharge status: Hospice | Attending: Internal Medicine | Admitting: Internal Medicine

## 2023-06-25 ENCOUNTER — Ambulatory Visit: Payer: Self-pay

## 2023-06-25 ENCOUNTER — Emergency Department (HOSPITAL_COMMUNITY)

## 2023-06-25 ENCOUNTER — Telehealth: Payer: Self-pay

## 2023-06-25 ENCOUNTER — Encounter (HOSPITAL_COMMUNITY): Payer: Self-pay | Admitting: Internal Medicine

## 2023-06-25 DIAGNOSIS — R001 Bradycardia, unspecified: Secondary | ICD-10-CM | POA: Diagnosis present

## 2023-06-25 DIAGNOSIS — Z881 Allergy status to other antibiotic agents status: Secondary | ICD-10-CM

## 2023-06-25 DIAGNOSIS — E785 Hyperlipidemia, unspecified: Secondary | ICD-10-CM | POA: Diagnosis not present

## 2023-06-25 DIAGNOSIS — I10 Essential (primary) hypertension: Secondary | ICD-10-CM | POA: Diagnosis present

## 2023-06-25 DIAGNOSIS — F0393 Unspecified dementia, unspecified severity, with mood disturbance: Secondary | ICD-10-CM | POA: Diagnosis present

## 2023-06-25 DIAGNOSIS — R7989 Other specified abnormal findings of blood chemistry: Secondary | ICD-10-CM | POA: Diagnosis not present

## 2023-06-25 DIAGNOSIS — Z515 Encounter for palliative care: Secondary | ICD-10-CM | POA: Diagnosis not present

## 2023-06-25 DIAGNOSIS — I130 Hypertensive heart and chronic kidney disease with heart failure and stage 1 through stage 4 chronic kidney disease, or unspecified chronic kidney disease: Secondary | ICD-10-CM | POA: Diagnosis not present

## 2023-06-25 DIAGNOSIS — I160 Hypertensive urgency: Secondary | ICD-10-CM

## 2023-06-25 DIAGNOSIS — N184 Chronic kidney disease, stage 4 (severe): Secondary | ICD-10-CM | POA: Diagnosis present

## 2023-06-25 DIAGNOSIS — I5033 Acute on chronic diastolic (congestive) heart failure: Secondary | ICD-10-CM | POA: Diagnosis present

## 2023-06-25 DIAGNOSIS — Z66 Do not resuscitate: Secondary | ICD-10-CM | POA: Diagnosis not present

## 2023-06-25 DIAGNOSIS — Z79899 Other long term (current) drug therapy: Secondary | ICD-10-CM

## 2023-06-25 DIAGNOSIS — I69320 Aphasia following cerebral infarction: Secondary | ICD-10-CM | POA: Diagnosis not present

## 2023-06-25 DIAGNOSIS — Z8249 Family history of ischemic heart disease and other diseases of the circulatory system: Secondary | ICD-10-CM | POA: Diagnosis not present

## 2023-06-25 DIAGNOSIS — F32A Depression, unspecified: Secondary | ICD-10-CM | POA: Diagnosis not present

## 2023-06-25 DIAGNOSIS — N401 Enlarged prostate with lower urinary tract symptoms: Secondary | ICD-10-CM | POA: Diagnosis not present

## 2023-06-25 DIAGNOSIS — N179 Acute kidney failure, unspecified: Secondary | ICD-10-CM | POA: Insufficient documentation

## 2023-06-25 DIAGNOSIS — Z951 Presence of aortocoronary bypass graft: Secondary | ICD-10-CM

## 2023-06-25 DIAGNOSIS — I482 Chronic atrial fibrillation, unspecified: Secondary | ICD-10-CM | POA: Diagnosis present

## 2023-06-25 DIAGNOSIS — D696 Thrombocytopenia, unspecified: Secondary | ICD-10-CM | POA: Diagnosis not present

## 2023-06-25 DIAGNOSIS — Z87891 Personal history of nicotine dependence: Secondary | ICD-10-CM

## 2023-06-25 DIAGNOSIS — Z86008 Personal history of in-situ neoplasm of other site: Secondary | ICD-10-CM

## 2023-06-25 DIAGNOSIS — I2510 Atherosclerotic heart disease of native coronary artery without angina pectoris: Secondary | ICD-10-CM | POA: Diagnosis present

## 2023-06-25 DIAGNOSIS — I272 Pulmonary hypertension, unspecified: Secondary | ICD-10-CM | POA: Diagnosis not present

## 2023-06-25 DIAGNOSIS — I071 Rheumatic tricuspid insufficiency: Secondary | ICD-10-CM | POA: Diagnosis not present

## 2023-06-25 DIAGNOSIS — Z7901 Long term (current) use of anticoagulants: Secondary | ICD-10-CM

## 2023-06-25 DIAGNOSIS — I4819 Other persistent atrial fibrillation: Secondary | ICD-10-CM | POA: Diagnosis not present

## 2023-06-25 DIAGNOSIS — I509 Heart failure, unspecified: Secondary | ICD-10-CM

## 2023-06-25 DIAGNOSIS — K219 Gastro-esophageal reflux disease without esophagitis: Secondary | ICD-10-CM | POA: Diagnosis not present

## 2023-06-25 DIAGNOSIS — I872 Venous insufficiency (chronic) (peripheral): Secondary | ICD-10-CM | POA: Diagnosis present

## 2023-06-25 DIAGNOSIS — Z8679 Personal history of other diseases of the circulatory system: Secondary | ICD-10-CM

## 2023-06-25 DIAGNOSIS — I2489 Other forms of acute ischemic heart disease: Secondary | ICD-10-CM | POA: Diagnosis present

## 2023-06-25 DIAGNOSIS — I69351 Hemiplegia and hemiparesis following cerebral infarction affecting right dominant side: Secondary | ICD-10-CM

## 2023-06-25 DIAGNOSIS — E7849 Other hyperlipidemia: Secondary | ICD-10-CM

## 2023-06-25 DIAGNOSIS — I5031 Acute diastolic (congestive) heart failure: Secondary | ICD-10-CM | POA: Diagnosis not present

## 2023-06-25 DIAGNOSIS — N189 Chronic kidney disease, unspecified: Secondary | ICD-10-CM | POA: Insufficient documentation

## 2023-06-25 DIAGNOSIS — K2100 Gastro-esophageal reflux disease with esophagitis, without bleeding: Secondary | ICD-10-CM

## 2023-06-25 LAB — URINALYSIS, ROUTINE W REFLEX MICROSCOPIC
Bilirubin Urine: NEGATIVE
Glucose, UA: NEGATIVE mg/dL
Hgb urine dipstick: NEGATIVE
Ketones, ur: NEGATIVE mg/dL
Leukocytes,Ua: NEGATIVE
Nitrite: NEGATIVE
Protein, ur: 100 mg/dL — AB
Specific Gravity, Urine: 1.01 (ref 1.005–1.030)
pH: 5 (ref 5.0–8.0)

## 2023-06-25 LAB — CBC
HCT: 29.9 % — ABNORMAL LOW (ref 39.0–52.0)
Hemoglobin: 9.7 g/dL — ABNORMAL LOW (ref 13.0–17.0)
MCH: 32.4 pg (ref 26.0–34.0)
MCHC: 32.4 g/dL (ref 30.0–36.0)
MCV: 100 fL (ref 80.0–100.0)
Platelets: 121 10*3/uL — ABNORMAL LOW (ref 150–400)
RBC: 2.99 MIL/uL — ABNORMAL LOW (ref 4.22–5.81)
RDW: 15.1 % (ref 11.5–15.5)
WBC: 5.2 10*3/uL (ref 4.0–10.5)
nRBC: 0 % (ref 0.0–0.2)

## 2023-06-25 LAB — BASIC METABOLIC PANEL WITH GFR
Anion gap: 12 (ref 5–15)
BUN: 61 mg/dL — ABNORMAL HIGH (ref 8–23)
CO2: 20 mmol/L — ABNORMAL LOW (ref 22–32)
Calcium: 8.8 mg/dL — ABNORMAL LOW (ref 8.9–10.3)
Chloride: 108 mmol/L (ref 98–111)
Creatinine, Ser: 3.24 mg/dL — ABNORMAL HIGH (ref 0.61–1.24)
GFR, Estimated: 17 mL/min — ABNORMAL LOW (ref >60)
Glucose, Bld: 108 mg/dL — ABNORMAL HIGH (ref 70–99)
Potassium: 4 mmol/L (ref 3.5–5.1)
Sodium: 140 mmol/L (ref 135–145)

## 2023-06-25 LAB — TROPONIN I (HIGH SENSITIVITY)
Troponin I (High Sensitivity): 25 ng/L — ABNORMAL HIGH (ref <18)
Troponin I (High Sensitivity): 20 ng/L — ABNORMAL HIGH (ref <18)

## 2023-06-25 LAB — CBG MONITORING, ED: Glucose-Capillary: 98 mg/dL (ref 70–99)

## 2023-06-25 LAB — BRAIN NATRIURETIC PEPTIDE: B Natriuretic Peptide: 896.6 pg/mL — ABNORMAL HIGH (ref 0.0–100.0)

## 2023-06-25 MED ORDER — FUROSEMIDE 10 MG/ML IJ SOLN: 20.0000 mg | Freq: Two times a day (BID) | INTRAMUSCULAR | Status: DC

## 2023-06-25 MED ORDER — ESCITALOPRAM OXALATE 10 MG PO TABS
20.0000 mg | ORAL_TABLET | Freq: Every day | ORAL | Status: DC
  Administered 2023-06-26 – 2023-06-27 (×2): 20 mg via ORAL
  Filled 2023-06-25 (×2): qty 2

## 2023-06-25 MED ORDER — ONDANSETRON HCL 4 MG/2ML IJ SOLN: 4.0000 mg | Freq: Four times a day (QID) | INTRAMUSCULAR | Status: DC | PRN

## 2023-06-25 MED ORDER — AMLODIPINE BESYLATE 5 MG PO TABS
5.0000 mg | ORAL_TABLET | Freq: Once | ORAL | Status: AC
  Administered 2023-06-25: 5 mg via ORAL
  Filled 2023-06-25: qty 1

## 2023-06-25 MED ORDER — FUROSEMIDE 10 MG/ML IJ SOLN
20.0000 mg | INTRAMUSCULAR | Status: AC
  Administered 2023-06-25: 20 mg via INTRAVENOUS
  Filled 2023-06-25: qty 2

## 2023-06-25 MED ORDER — AMLODIPINE BESYLATE 5 MG PO TABS
5.0000 mg | ORAL_TABLET | Freq: Every day | ORAL | Status: DC
  Administered 2023-06-26 – 2023-06-27 (×2): 5 mg via ORAL
  Filled 2023-06-25 (×2): qty 1

## 2023-06-25 MED ORDER — SODIUM CHLORIDE 0.9% FLUSH
3.0000 mL | Freq: Two times a day (BID) | INTRAVENOUS | Status: DC
  Administered 2023-06-26: 3 mL via INTRAVENOUS

## 2023-06-25 MED ORDER — APIXABAN 2.5 MG PO TABS
2.5000 mg | ORAL_TABLET | Freq: Two times a day (BID) | ORAL | Status: DC
  Administered 2023-06-25 – 2023-06-27 (×4): 2.5 mg via ORAL
  Filled 2023-06-25 (×5): qty 1

## 2023-06-25 MED ORDER — FERROUS SULFATE 325 (65 FE) MG PO TABS
325.0000 mg | ORAL_TABLET | Freq: Every day | ORAL | Status: DC
  Administered 2023-06-26 – 2023-06-27 (×2): 325 mg via ORAL
  Filled 2023-06-25 (×2): qty 1

## 2023-06-25 MED ORDER — SODIUM CHLORIDE 0.9 % IV BOLUS: 500.0000 mL | Freq: Once | INTRAVENOUS | Status: DC

## 2023-06-25 MED ORDER — PANTOPRAZOLE SODIUM 20 MG PO TBEC
20.0000 mg | DELAYED_RELEASE_TABLET | Freq: Every day | ORAL | Status: DC
  Administered 2023-06-25 – 2023-06-27 (×3): 20 mg via ORAL
  Filled 2023-06-25 (×3): qty 1

## 2023-06-25 MED ORDER — MEMANTINE HCL 10 MG PO TABS
10.0000 mg | ORAL_TABLET | Freq: Every day | ORAL | Status: DC
  Administered 2023-06-26 – 2023-06-27 (×2): 10 mg via ORAL
  Filled 2023-06-25 (×2): qty 1

## 2023-06-25 MED ORDER — SODIUM CHLORIDE 0.9% FLUSH: 3.0000 mL | INTRAVENOUS | Status: DC | PRN

## 2023-06-25 MED ORDER — ACETAMINOPHEN 650 MG RE SUPP: 650.0000 mg | Freq: Four times a day (QID) | RECTAL | Status: DC | PRN

## 2023-06-25 MED ORDER — SODIUM CHLORIDE 0.9% FLUSH
3.0000 mL | Freq: Two times a day (BID) | INTRAVENOUS | Status: DC
  Administered 2023-06-25 – 2023-06-26 (×2): 3 mL via INTRAVENOUS

## 2023-06-25 MED ORDER — SODIUM CHLORIDE 0.9 % IV SOLN: 250.0000 mL | INTRAVENOUS | Status: AC | PRN

## 2023-06-25 MED ORDER — ROSUVASTATIN CALCIUM 20 MG PO TABS
20.0000 mg | ORAL_TABLET | Freq: Every day | ORAL | Status: DC
  Administered 2023-06-26 – 2023-06-27 (×2): 20 mg via ORAL
  Filled 2023-06-25 (×2): qty 1

## 2023-06-25 MED ORDER — ONDANSETRON HCL 4 MG PO TABS: 4.0000 mg | ORAL_TABLET | Freq: Four times a day (QID) | ORAL | Status: DC | PRN

## 2023-06-25 MED ORDER — DOXAZOSIN MESYLATE 4 MG PO TABS
4.0000 mg | ORAL_TABLET | Freq: Every day | ORAL | Status: DC
  Administered 2023-06-26 – 2023-06-27 (×2): 4 mg via ORAL
  Filled 2023-06-25 (×2): qty 1

## 2023-06-25 MED ORDER — ACETAMINOPHEN 325 MG PO TABS: 650.0000 mg | ORAL_TABLET | Freq: Four times a day (QID) | ORAL | Status: DC | PRN

## 2023-06-25 MED ORDER — HYDRALAZINE HCL 20 MG/ML IJ SOLN
5.0000 mg | Freq: Four times a day (QID) | INTRAMUSCULAR | Status: DC | PRN
Start: 2023-06-25 — End: 2023-06-26

## 2023-06-25 MED ORDER — FUROSEMIDE 10 MG/ML IJ SOLN
20.0000 mg | Freq: Once | INTRAMUSCULAR | Status: AC
  Administered 2023-06-25: 20 mg via INTRAVENOUS
  Filled 2023-06-25: qty 2

## 2023-06-25 NOTE — ED Notes (Signed)
 1 set of [facility] sent down when starting IV just in case.

## 2023-06-25 NOTE — ED Triage Notes (Addendum)
 Pt BIB [facility] from [facility] for Malaise and SOB. PT is aphasic at baseline. Answers yes/no questions.  Pretty active at baseline but has not gotten out of bed today.  Lung sounds clear.  Family is coming.  96-98% RA, 188/73, afib 50-70 bpm, RR 16 CBG 111

## 2023-06-25 NOTE — Telephone Encounter (Signed)
 Copied from CRM [id number]. Topic: Clinical - Medical Advice >> Jun 25, 2023 10:03 AM Adaysia C wrote: Reason for CRM: Patients daughter called in to ask the provider if now would be the right time to move the patient to hospice care; please follow up with patients daughter(Verna)                 [phone]

## 2023-06-25 NOTE — ED Notes (Signed)
 PT attempted to urinate in a urinal, unable to at this time

## 2023-06-25 NOTE — H&P (Signed)
 History and Physical    [name]:[id number] DOB: 1928/09/16 DOA: 06/25/2023  PCP: Almira Jaeger, MD   Patient coming from: ALF   Chief Complaint:  Chief Complaint  Patient presents with   Weakness   Shortness of Breath   ED TRIAGE note:    Pt BIB [facility] from [facility] for [facility] and SOB. PT is aphasic at baseline. Answers yes/no questions.  Pretty active at baseline but has not gotten out of bed today.  Lung sounds clear.  Family is coming.  96-98% RA, 188/73, afib 50-70 bpm, RR 16 CBG 111      HPI:  [name] is a 89 y.o. male with medical history significant of persistent atrial fibrillation on Eliquis , CAD status post CABG in mid 90s, CVA, diastolic heart failure preserved EF, essential hypertension, hyperlipidemia, chronic thrombocytopenia, BPH, iron deficiency anemia, CKD stage IV, chronic depression and age-related frailty presented to emergency department complaining of shortness of breath and not as active as baseline.  Patient's daughter at the bedside reported that she has been noticing patient has more progressive weakness with associated shortness of breath.  Patient reported feeling sensation of chest pressure, orthopnea dyspnea and PND.  Denies any bilateral lower extremity swelling. Denies any chest pain, diaphoresis, nausea, headache, blurry vision, abdominal pain, constipation or diarrhea.  ED Course:  Presentation to ED patient found to have elevated blood pressure 197/77, bradycardic heart rate in between 52-63 otherwise hemodynamically stable. Elevated troponin 25 and 20. Elevated BNP around 900. BMP showing creatinine 3.24 and GFR 17.  Renal function at baseline.  Low bicarb 20, elevated BUN 61. CBC showing stable H&H 9.7 and 29.  Chronically low platelet count 121 and normal WBC count. UA showing rare bacteria otherwise unremarkable. EKG showing atrial fibrillation heart rate 64.  Chest x-ray showed congestive changes  suggested with cardiac enlargement, pulmonary vascular congestion, mild perihilar edema, and small pleural effusions.  In the ED patient has been given amlodipine  5 mg and Lasix  20 mg.  Hospitalist has been consulted for further evaluation and management of acute on chronic diastolic heart failure exacerbation and hypertensive urgency.    Significant labs in the ED: Lab Orders         Basic metabolic panel         CBC         Urinalysis, Routine w reflex microscopic -Urine, Clean Catch         Brain natriuretic peptide         Comprehensive metabolic panel         CBC         CBG monitoring, ED       Review of Systems:  Review of Systems  Constitutional:  Negative for chills, fever, malaise/fatigue and weight loss.  Respiratory:  Positive for shortness of breath. Negative for cough, sputum production and wheezing.   Cardiovascular:  Positive for orthopnea and PND. Negative for chest pain, palpitations and leg swelling.  Gastrointestinal:  Negative for abdominal pain, heartburn, nausea and vomiting.  Musculoskeletal:  Negative for back pain, falls, joint pain, myalgias and neck pain.  Neurological:  Negative for dizziness and headaches.    Past Medical History:  Diagnosis Date   Actinic keratosis    BPH with urinary obstruction    Bradycardia    CAD (coronary artery disease)    CABG x5   Cerebrovascular accident (stroke) (HCC)    With right hemiparesis and persistent expressive aphasia   GERD (  gastroesophageal reflux disease)    History of hydronephrosis    Hx of hydronephrosis 07/08/2011   right    Hyperlipidemia    Hypertension    Iron deficiency anemia due to chronic blood loss 03/26/2007   Now resolved     Iron deficiency anemia secondary to blood loss (chronic)    Persistent atrial fibrillation (HCC)    Pulmonary hypertension (HCC)    by echo 2013   Rosacea    Situational depression    Severe   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 He has never used smokeless tobacco. He reports current alcohol use of about 1.0 standard drink of alcohol per week. He reports that he does not use drugs.  Allergies  Allergen Reactions   Ciprofloxacin Hcl Other (See Comments)    Reaction unknown by family   Sulfamethoxazole-Trimethoprim Other (See Comments)    Reaction unknown by family    Family History  Problem Relation Age of Onset   Heart disease Father    Heart attack Other        fhx   Coronary artery disease Other        fhx    Prior to Admission medications   Medication Sig Start Date End Date Taking? Authorizing Provider  amLODipine  (NORVASC ) 5 MG tablet TAKE 1 TABLET BY MOUTH ONCE DAILY 08/14/22   Almira Jaeger, MD  apixaban  (ELIQUIS ) 2.5 MG TABS tablet Take 1 tablet (2.5 mg total) by mouth 2 (two) times daily. 08/16/22   Almira Jaeger, MD  Calcium  Carb-Cholecalciferol (CALCIUM  500 + D3 PO) Take by mouth.    [provider]  doxazosin  (CARDURA ) 4 MG tablet Take 1 tablet (4 mg total) by mouth daily. 08/14/22   Almira Jaeger, MD  escitalopram  (LEXAPRO ) 20 MG tablet Take 1 tablet (20 mg total) by mouth daily. 08/16/22   Almira Jaeger, MD  Ferrous Sulfate (IRON PO) Take 1 tablet by mouth daily.    [provider]  furosemide  (LASIX ) 40 MG tablet Take 1 tablet (40 mg total) by mouth 2 (two) times daily. 08/15/22   Almira Jaeger, MD  Lansoprazole (PREVACID PO) Take 15 mg by mouth daily.    [provider]  memantine  (NAMENDA )  10 MG tablet Take 1 tablet (10 mg total) by mouth daily. 04/20/23   Almira Jaeger, MD  Multiple Vitamin (MULTIVITAMIN) tablet Take 1 tablet by mouth daily.    [provider]  rosuvastatin  (CRESTOR ) 20 MG tablet TAKE 1 TABLET BY MOUTH ONCE DAILY 08/14/22   Almira Jaeger, MD     Physical Exam: Vitals:   06/25/23 1930 06/25/23 2015 06/25/23 2136 06/26/23 0003  BP: (!) 173/88 (!) 187/83 (!) 201/68 (!) 158/93  Pulse: 68 70 77 69  Resp: 19 18 20 19   Temp:  97.6 F (36.4 C) 97.7 F (36.5 C)  TempSrc:   Oral Oral  SpO2: 97% 100% 97% 94%  Weight:   78 kg 78.1 kg  Height:   6' (1.829 m)     Physical Exam Vitals and nursing note reviewed.  Constitutional:      Appearance: He is not ill-appearing.  Cardiovascular:     Rate and Rhythm: Normal rate. Rhythm irregular.     Heart sounds: No murmur heard. Pulmonary:     Effort: Pulmonary effort is normal.     Breath sounds: Examination of the right-middle field reveals rales. Examination of the left-middle field reveals rales. Rales present. No decreased breath sounds, wheezing or rhonchi.  Musculoskeletal:     Cervical back: Neck supple.     Right lower leg: No edema.     Left lower leg: No edema.  Skin:    General: Skin is warm.     Capillary Refill: Capillary refill takes less than 2 seconds.  Neurological:     Mental Status: He is alert.     Comments: Patient is alert oriented to self and 2 daughter at the bedside  Psychiatric:        Mood and Affect: Mood normal. Mood is not anxious.      Labs on Admission: I have personally reviewed following labs and imaging studies  CBC: Recent Labs  Lab 06/25/23 1412  WBC 5.2  HGB 9.7*  HCT 29.9*  MCV 100.0  PLT 121*   Basic Metabolic Panel: Recent Labs  Lab 06/25/23 1412  NA 140  K 4.0  CL 108  CO2 20*  GLUCOSE 108*  BUN 61*  CREATININE 3.24*  CALCIUM  8.8*   GFR: Estimated Creatinine Clearance: 16 mL/min (A) (by C-G formula based on SCr of 3.24 mg/dL  (H)). Liver Function Tests: No results for input(s): "AST", "ALT", "ALKPHOS", "BILITOT", "PROT", "ALBUMIN" in the last 168 hours. No results for input(s): "LIPASE", "AMYLASE" in the last 168 hours. No results for input(s): "AMMONIA" in the last 168 hours. Coagulation Profile: No results for input(s): "INR", "PROTIME" in the last 168 hours. Cardiac Enzymes: Recent Labs  Lab 06/25/23 1412 06/25/23 1804  TROPONINIHS 25* 20*   BNP (last 3 results) Recent Labs    06/25/23 1412  BNP 896.6*   HbA1C: No results for input(s): "HGBA1C" in the last 72 hours. CBG: Recent Labs  Lab 06/25/23 1412  GLUCAP 98   Lipid Profile: No results for input(s): "CHOL", "HDL", "LDLCALC", "TRIG", "CHOLHDL", "LDLDIRECT" in the last 72 hours. Thyroid  Function Tests: No results for input(s): "TSH", "T4TOTAL", "FREET4", "T3FREE", "THYROIDAB" in the last 72 hours. Anemia Panel: No results for input(s): "VITAMINB12", "FOLATE", "FERRITIN", "TIBC", "IRON", "RETICCTPCT" in the last 72 hours. Urine analysis:    Component Value Date/Time   COLORURINE STRAW (A) 06/25/2023 1412   APPEARANCEUR CLEAR 06/25/2023 1412   LABSPEC 1.010 06/25/2023 1412   PHURINE 5.0 06/25/2023 1412   GLUCOSEU NEGATIVE 06/25/2023 1412   GLUCOSEU NEGATIVE 10/01/2017 1409   HGBUR NEGATIVE 06/25/2023 1412   BILIRUBINUR NEGATIVE 06/25/2023 1412   BILIRUBINUR neg 05/10/2021 1710   KETONESUR NEGATIVE 06/25/2023 1412   PROTEINUR 100 (A) 06/25/2023 1412   UROBILINOGEN 0.2 05/10/2021 1710   UROBILINOGEN 0.2 10/01/2017 1409   NITRITE NEGATIVE 06/25/2023 1412   LEUKOCYTESUR NEGATIVE 06/25/2023 1412    Radiological Exams on Admission: I have personally reviewed images DG Chest 2 View Result Date: 06/25/2023 CLINICAL DATA:  Weakness and heavier breathing. Shortness of breath. Aphasia baseline. EXAM:  CHEST - 2 VIEW COMPARISON:  07/09/2011 FINDINGS: Postoperative changes in the mediastinum. Cardiac enlargement. Pulmonary vascular congestion.  Mild perihilar infiltration likely edema. Small bilateral pleural effusions. No pneumothorax. Mediastinal contours appear intact. Calcification of the aorta. IMPRESSION: Congestive changes suggested with cardiac enlargement, pulmonary vascular congestion, mild perihilar edema, and small pleural effusions. Electronically Signed   By: Boyce Byes M.D.   On: 06/25/2023 16:14   EKG: My personal interpretation of EKG shows: Atrial fibrillation rate controlled to 64.   Assessment/Plan: Principal Problem:   Acute on chronic diastolic (congestive) heart failure (HCC) Active Problems:   Acute on chronic diastolic CHF (congestive heart failure) (HCC)   Hypertensive urgency   Elevated troponin   Hyperlipidemia   Essential hypertension   GERD   Chronic atrial fibrillation (HCC)   Bradycardia   Thrombocytopenia (HCC)   CKD (chronic kidney disease), stage IV (HCC)   Venous insufficiency   History of CAD (coronary artery disease)   Chronic depression   Acute kidney injury superimposed on chronic kidney disease (HCC)    Assessment and Plan: Acute on chronic diastolic heart failure exacerbation Hypertensive urgency -Present emergency with complaining of shortness of breath on exertion, chest pressure, and not active as baseline. - At presentation to ED patient blood pressure was 197/75 which has been improved to 158/93 after giving total 40 mg of IV Lasix  and restarting home amlodipine  10 mg - Elevated troponin 25 trended up to 20.  EKG showing atrial fibrillation rate controlled to 64. - Elevated BNP around 900. - Chest x-ray showed congestive changes suggestive of enlargement, pulmonary vascular congestion mild perihilar edema and small pleural effusion. - Continue IV Lasix  40 mg twice daily, strict I's/O, daily weight and goal to improve -1 to 1.5 L announced next 24-hour. - Continue amlodipine  5 mg daily. - Continue hydralazine as needed. - Obtaining echocardiogram.   Elevated  troponin-secondary to demand ischemia -Elevated troponin 25-10 or 20.  EKG showing atrial fibrillation with controlled 64.  Admit troponin the setting of demand ischemia. - Continue cardiac monitoring.  Chronic atrial fibrillation History of chronic bradycardia -EKG showing atrial fibrillation rate controlled to 64. -At patient is Eliquis  5 mg twice daily however given creatinine> 1.5, age above 90 need to adjusted dose of Eliquis  to  2.5 mg twice daily. -Rate controlled without any beta-blocker/antiarrhythmic agent.  Continue to monitor heart rate  Chronic thrombocytopenia -Low platelet count 121.  Continue to monitor as patient is on Eliquis   Acute kidney injury superimposed CKD stage IV -Elevated creatinine 3.24.  Baseline creatinines around 1.9-3.1 -Acute kidney injury in the setting of CHF exacerbation and hypertensive urgency. - Continue to treat for heart failure as mentioned above. - Monitor renal function.   History of CAD status post CABG in mid 80s -Cardiology discontinued aspirin  outpatient as patient is on Eliquis  for management of A-fib - Continue Eliquis  and Crestor .  Hyperlipidemia -Continue Crestor   Chronic depression -Continue Lexapro   Memory impairment due to advanced age -Continue memantine   GERD -Continue Protonix    DVT prophylaxis:  Eliquis  Code Status:  Full Code.  Verified CODE STATUS with patient's daughter at the bedside.  At this time Ms. Venra is very overwhelmed and planning to keep full code as of now.  Patient's daughter requested for palliative consult to discuss further goal of care with palliative team. Diet: Heart healthy with fluid restriction and salt restriction Family Communication:  Family was present at bedside, at the time of interview. Opportunity was given to ask question and  all questions were answered satisfactorily.  Disposition Plan: Continue to monitor improvement of blood pressure and volume status. Consults: Palliative  team Admission status:   Observation, Telemetry bed  Severity of Illness: The appropriate patient status for this patient is OBSERVATION. Observation status is judged to be reasonable and necessary in order to provide the required intensity of service to ensure the patient's safety. The patient's presenting symptoms, physical exam findings, and initial radiographic and laboratory data in the context of their medical condition is felt to place them at decreased risk for further clinical deterioration. Furthermore, it is anticipated that the patient will be medically stable for discharge from the hospital within 2 midnights of admission.     Gaynor Ferreras, MD [facility]  How to contact the [facility] Attending or Consulting provider 7A - 7P or covering provider during after hours 7P -7A, for this patient.  Check the care team in [facility] and look for a) attending/consulting [facility] provider listed and b) the [facility] team listed Log into [email or web address] and use [facility] universal password to access. If you do not have the password, please contact the hospital operator. Locate the [facility] provider you are looking for under [facility] and page to a number that you can be directly reached. If you still have difficulty reaching the provider, please page the [facility] (Director on Call) for the Hospitalists listed on amion for assistance.  06/26/2023, 2:13 AM

## 2023-06-25 NOTE — ED Notes (Signed)
 Pt unable to void at this time.

## 2023-06-25 NOTE — Telephone Encounter (Signed)
 Any recent changes in his health to suggest further decline?  Usually there is some level of decline when we make this decision

## 2023-06-25 NOTE — Telephone Encounter (Signed)
 Chief Complaint: shallow breathing Symptoms: shallow breathing Frequency: this morning Pertinent Negatives: Patient denies respiratory symptoms Disposition: [x] ED /[] Urgent Care (no appt availability in office) / [] Appointment(In office/virtual)/ []  [facility]/ [] Home Care/ [] Refused Recommended Disposition /[] [facility]/ [x]  Follow-up with PCP Additional Notes: pt daughter states that yesterday pt went to bed at 1530, and states that he got up this morning and then was back in bed about 0930.  Pt states that the his chronic kidney disease and she is questioning. States pt is not eating good either. States that patient is now more sob with movements. Daughter states that his aide states that he did have some periods earlier where he did stop breathing and then resumed. Daughter is calling 911 but would still like a call back from Dr. Arlene Ben.    Copied from CRM [id number]. Topic: Clinical - Red Word Triage >> Jun 25, 2023 12:22 PM Albertha Alosa wrote: Red Word that prompted transfer to Nurse Triage: Patient daughter, Angelina Bare called in regarding concerns about patient, stated patient is breathing shallow, and wanted to know what she needs to do Reason for Disposition  [1] Periods where breathing stops and then resumes normally AND [2] bedridden (e.g., CVA)  Slow, shallow and weak breathing  Answer Assessment - Initial Assessment Questions 1. RESPIRATORY STATUS: "Describe your breathing?" (e.g., wheezing, shortness of breath, unable to speak, severe coughing)      Shallow breathing 2. ONSET: "When did this breathing problem begin?"      yesterday 3. PATTERN "Does the difficult breathing come and go, or has it been constant since it started?"      constant 4. SEVERITY: "How bad is your breathing?" (e.g., mild, moderate, severe)    - MILD: No SOB at rest, mild SOB with walking, speaks normally in sentences, can lie down, no retractions, pulse < 100.    - MODERATE: SOB at rest, SOB  with minimal exertion and prefers to sit, cannot lie down flat, speaks in phrases, mild retractions, audible wheezing, pulse 100-120.    - SEVERE: Very SOB at rest, speaks in single words, struggling to breathe, sitting hunched forward, retractions, pulse > 120      Shallow breathing 5. RECURRENT SYMPTOM: "Have you had difficulty breathing before?" If Yes, ask: "When was the last time?" and "What happened that time?"      no 8. CAUSE: "What do you think is causing the breathing problem?"      unknown 9. OTHER SYMPTOMS: "Do you have any other symptoms? (e.g., dizziness, runny nose, cough, chest pain, fever)     no 10. O2 SATURATION MONITOR:  "Do you use an oxygen saturation monitor (pulse oximeter) at home?" If Yes, ask: "What is your reading (oxygen level) today?" "What is your usual oxygen saturation reading?" (e.g., 95%)       Can't find it  Protocols used: Breathing Difficulty-A-AH

## 2023-06-25 NOTE — ED Provider Notes (Signed)
 [place] EMERGENCY DEPARTMENT AT [facility] Provider Note   CSN: [id number] Arrival date & time: 06/25/23  1350     History  Chief Complaint  Patient presents with   Weakness   Shortness of Breath    ITZHAK BARTNIK is a 89 y.o. male, hx of CVA, chronic afib, who presents to the ED 2/2 to increased fatigue for the last day.  Daughter at bedside, states the patient's home health aide (from [facility]) states that he has been more sleepy lately.  Went to sleep at 330 yesterday, and then this a.m.  Notes that he also had some increased work of breath, but denies any runny nose sore throat, cough.  Patient has significant aphasia thus history limited.  Denies any pain with urination, chest pain, nausea, or vomiting.  Per daughter patient did not eat today    Home Medications Prior to Admission medications   Medication Sig Start Date End Date Taking? Authorizing Provider  amLODipine  (NORVASC ) 5 MG tablet TAKE 1 TABLET BY MOUTH ONCE DAILY 08/14/22  Yes Almira Jaeger, MD  apixaban  (ELIQUIS ) 2.5 MG TABS tablet Take 1 tablet (2.5 mg total) by mouth 2 (two) times daily. 08/16/22  Yes Almira Jaeger, MD  doxazosin  (CARDURA ) 4 MG tablet Take 1 tablet (4 mg total) by mouth daily. 08/14/22  Yes Almira Jaeger, MD  escitalopram  (LEXAPRO ) 20 MG tablet Take 1 tablet (20 mg total) by mouth daily. 08/16/22  Yes Almira Jaeger, MD  ferrous sulfate 325 (65 FE) MG tablet Take 325 mg by mouth daily with breakfast.   Yes [provider]  furosemide  (LASIX ) 40 MG tablet Take 1 tablet (40 mg total) by mouth 2 (two) times daily. 08/15/22  Yes Almira Jaeger, MD  memantine  (NAMENDA ) 10 MG tablet Take 1 tablet (10 mg total) by mouth daily. 04/20/23  Yes Almira Jaeger, MD  rosuvastatin  (CRESTOR ) 20 MG tablet TAKE 1 TABLET BY MOUTH ONCE DAILY Patient taking differently: Take 20 mg by mouth at bedtime. 08/14/22  Yes Almira Jaeger, MD  lansoprazole (PREVACID  SOLUTAB) 15 MG disintegrating tablet Take 15 mg by mouth 2 (two) times daily as needed (for heartburn- dissolve orally).    [provider]      Allergies    Ciprofloxacin hcl and Sulfamethoxazole-trimethoprim    Review of Systems   Review of Systems  Constitutional:  Positive for fatigue. Negative for fever.  Respiratory:  Positive for shortness of breath.   Neurological:  Positive for weakness.    Physical Exam Updated Vital Signs BP (!) 207/92   Pulse 70   Temp (!) 97.5 F (36.4 C) (Oral)   Resp 18   SpO2 99%  Physical Exam Vitals and nursing note reviewed.  Constitutional:      General: He is not in acute distress.    Appearance: He is well-developed.  HENT:     Head: Normocephalic and atraumatic.  Eyes:     Conjunctiva/sclera: Conjunctivae normal.  Cardiovascular:     Rate and Rhythm: Normal rate and regular rhythm.     Heart sounds: No murmur heard. Pulmonary:     Effort: Pulmonary effort is normal. No respiratory distress.     Breath sounds: Normal breath sounds.  Abdominal:     Palpations: Abdomen is soft.     Tenderness: There is no abdominal tenderness.  Musculoskeletal:        General: No swelling.     Cervical back: Neck  supple.  Skin:    General: Skin is warm and dry.     Capillary Refill: Capillary refill takes less than 2 seconds.  Neurological:     Mental Status: He is alert.     Comments: +aphasic (baseline per daughter)  Psychiatric:        Mood and Affect: Mood normal.     ED Results / Procedures / Treatments   Labs (all labs ordered are listed, but only abnormal results are displayed) Labs Reviewed  BASIC METABOLIC PANEL WITH GFR - Abnormal; Notable for the following components:      Result Value   CO2 20 (*)    Glucose, Bld 108 (*)    BUN 61 (*)    Creatinine, Ser 3.24 (*)    Calcium  8.8 (*)    GFR, Estimated 17 (*)    All other components within normal limits  CBC - Abnormal; Notable for the following components:   RBC  2.99 (*)    Hemoglobin 9.7 (*)    HCT 29.9 (*)    Platelets 121 (*)    All other components within normal limits  URINALYSIS, ROUTINE W REFLEX MICROSCOPIC - Abnormal; Notable for the following components:   Color, Urine STRAW (*)    Protein, ur 100 (*)    Bacteria, UA RARE (*)    All other components within normal limits  BRAIN NATRIURETIC PEPTIDE - Abnormal; Notable for the following components:   B Natriuretic Peptide 896.6 (*)    All other components within normal limits  TROPONIN I (HIGH SENSITIVITY) - Abnormal; Notable for the following components:   Troponin I (High Sensitivity) 25 (*)    All other components within normal limits  TROPONIN I (HIGH SENSITIVITY) - Abnormal; Notable for the following components:   Troponin I (High Sensitivity) 20 (*)    All other components within normal limits  COMPREHENSIVE METABOLIC PANEL WITH GFR  CBC  CBG MONITORING, ED    EKG EKG Interpretation Date/Time:  Monday June 25 2023 14:00:04 EDT Ventricular Rate:  64 PR Interval:    QRS Duration:  104 QT Interval:  474 QTC Calculation: 489 R Axis:   -39  Text Interpretation: Atrial fibrillation Left axis deviation Incomplete right bundle branch block Septal infarct , age undetermined Abnormal ECG When compared with ECG of 09-Mar-2023 11:26, PREVIOUS ECG IS PRESENT Confirmed by Afton Horse [phone]) on 06/25/2023 4:56:34 PM  Radiology DG Chest 2 View Result Date: 06/25/2023 CLINICAL DATA:  Weakness and heavier breathing. Shortness of breath. Aphasia baseline. EXAM: CHEST - 2 VIEW COMPARISON:  07/09/2011 FINDINGS: Postoperative changes in the mediastinum. Cardiac enlargement. Pulmonary vascular congestion. Mild perihilar infiltration likely edema. Surina Storts bilateral pleural effusions. No pneumothorax. Mediastinal contours appear intact. Calcification of the aorta. IMPRESSION: Congestive changes suggested with cardiac enlargement, pulmonary vascular congestion, mild perihilar edema, and Charlsey Moragne  pleural effusions. Electronically Signed   By: Boyce Byes M.D.   On: 06/25/2023 16:14    Procedures Procedures    Medications Ordered in ED Medications  amLODipine  (NORVASC ) tablet 5 mg (has no administration in time range)  doxazosin  (CARDURA ) tablet 4 mg (has no administration in time range)  rosuvastatin  (CRESTOR ) tablet 20 mg (has no administration in time range)  escitalopram  (LEXAPRO ) tablet 20 mg (has no administration in time range)  memantine  (NAMENDA ) tablet 10 mg (has no administration in time range)  pantoprazole  (PROTONIX ) EC tablet 20 mg (has no administration in time range)  apixaban  (ELIQUIS ) tablet 2.5 mg (has no administration in time  range)  Iron TABS (has no administration in time range)  furosemide  (LASIX ) injection 20 mg (has no administration in time range)  sodium chloride  flush (NS) 0.9 % injection 3 mL (has no administration in time range)  sodium chloride  flush (NS) 0.9 % injection 3 mL (has no administration in time range)  sodium chloride  flush (NS) 0.9 % injection 3 mL (has no administration in time range)  0.9 %  sodium chloride  infusion (has no administration in time range)  acetaminophen  (TYLENOL ) tablet 650 mg (has no administration in time range)    Or  acetaminophen  (TYLENOL ) suppository 650 mg (has no administration in time range)  ondansetron  (ZOFRAN ) tablet 4 mg (has no administration in time range)    Or  ondansetron  (ZOFRAN ) injection 4 mg (has no administration in time range)  furosemide  (LASIX ) injection 20 mg (20 mg Intravenous Given 06/25/23 1730)  amLODipine  (NORVASC ) tablet 5 mg (5 mg Oral Given 06/25/23 1847)    ED Course/ Medical Decision Making/ A&P                                 Medical Decision Making Patient is a 89 year old male, here for weakness, and fatigue that is been going on for the last day, as well as increased work of breathing.  He is overall well-appearing on my exam aphasic.  Will obtain a chest x-ray,  urinalysis, BNP, troponin for further evaluation. He has nonlabored respirations on my exam  Amount and/or Complexity of Data Reviewed Labs: ordered.    Details: BNP, elevated, creatinine elevated but similar to baseline Radiology: ordered.    Details: Chest x-ray shows vascular congestion with perihilar edema and Presley Summerlin pleural effusions Discussion of management or test interpretation with external provider(s): Discussed plan at length, with daughter, who is his POA, she would prefer palliative, comfort care, is in the process of possibly setting up hospice, and is asking for help in regards to this.  She states that she would not like to have CPR, or intubation done on the patient.  Patient is overall well-appearing however given his increased work of breathing at home, CKD that is existing, not use of Lasix , I will admit him to the hospitalist, for management of cardiorenal syndrome, given his elevated creatinine, and this fluid overload status.  For gentle diuresis.  Daughter voiced understanding, and was admitted to Dr. Ulice Gamer  Risk Prescription drug management. Decision regarding hospitalization.   Final Clinical Impression(s) / ED Diagnoses Final diagnoses:  Cardiorenal syndrome with renal failure, stage 1-4 or unspecified chronic kidney disease, with heart failure [facility])    Rx / DC Orders ED Discharge Orders     None         Timmy Forbes, [place] 06/25/23 1936    Tegeler, Marine Sia, MD 06/26/23 1500

## 2023-06-25 NOTE — Telephone Encounter (Signed)
 Patient currently in ED.

## 2023-06-26 ENCOUNTER — Observation Stay (HOSPITAL_COMMUNITY)

## 2023-06-26 DIAGNOSIS — I272 Pulmonary hypertension, unspecified: Secondary | ICD-10-CM | POA: Diagnosis present

## 2023-06-26 DIAGNOSIS — F32A Depression, unspecified: Secondary | ICD-10-CM | POA: Diagnosis present

## 2023-06-26 DIAGNOSIS — I4819 Other persistent atrial fibrillation: Secondary | ICD-10-CM | POA: Diagnosis present

## 2023-06-26 DIAGNOSIS — Z881 Allergy status to other antibiotic agents status: Secondary | ICD-10-CM | POA: Diagnosis not present

## 2023-06-26 DIAGNOSIS — Z515 Encounter for palliative care: Secondary | ICD-10-CM

## 2023-06-26 DIAGNOSIS — D696 Thrombocytopenia, unspecified: Secondary | ICD-10-CM | POA: Diagnosis present

## 2023-06-26 DIAGNOSIS — I071 Rheumatic tricuspid insufficiency: Secondary | ICD-10-CM | POA: Diagnosis present

## 2023-06-26 DIAGNOSIS — Z66 Do not resuscitate: Secondary | ICD-10-CM

## 2023-06-26 DIAGNOSIS — I160 Hypertensive urgency: Secondary | ICD-10-CM | POA: Diagnosis present

## 2023-06-26 DIAGNOSIS — I130 Hypertensive heart and chronic kidney disease with heart failure and stage 1 through stage 4 chronic kidney disease, or unspecified chronic kidney disease: Secondary | ICD-10-CM

## 2023-06-26 DIAGNOSIS — I5031 Acute diastolic (congestive) heart failure: Secondary | ICD-10-CM

## 2023-06-26 DIAGNOSIS — N401 Enlarged prostate with lower urinary tract symptoms: Secondary | ICD-10-CM | POA: Diagnosis present

## 2023-06-26 DIAGNOSIS — I5033 Acute on chronic diastolic (congestive) heart failure: Secondary | ICD-10-CM | POA: Diagnosis present

## 2023-06-26 DIAGNOSIS — Z951 Presence of aortocoronary bypass graft: Secondary | ICD-10-CM | POA: Diagnosis not present

## 2023-06-26 DIAGNOSIS — E785 Hyperlipidemia, unspecified: Secondary | ICD-10-CM | POA: Diagnosis present

## 2023-06-26 DIAGNOSIS — F0393 Unspecified dementia, unspecified severity, with mood disturbance: Secondary | ICD-10-CM | POA: Diagnosis present

## 2023-06-26 DIAGNOSIS — I2510 Atherosclerotic heart disease of native coronary artery without angina pectoris: Secondary | ICD-10-CM | POA: Diagnosis present

## 2023-06-26 DIAGNOSIS — I509 Heart failure, unspecified: Secondary | ICD-10-CM

## 2023-06-26 DIAGNOSIS — Z8249 Family history of ischemic heart disease and other diseases of the circulatory system: Secondary | ICD-10-CM | POA: Diagnosis not present

## 2023-06-26 DIAGNOSIS — I2489 Other forms of acute ischemic heart disease: Secondary | ICD-10-CM | POA: Diagnosis present

## 2023-06-26 DIAGNOSIS — K219 Gastro-esophageal reflux disease without esophagitis: Secondary | ICD-10-CM | POA: Diagnosis present

## 2023-06-26 DIAGNOSIS — N184 Chronic kidney disease, stage 4 (severe): Secondary | ICD-10-CM | POA: Insufficient documentation

## 2023-06-26 DIAGNOSIS — I69320 Aphasia following cerebral infarction: Secondary | ICD-10-CM | POA: Diagnosis not present

## 2023-06-26 DIAGNOSIS — N179 Acute kidney failure, unspecified: Secondary | ICD-10-CM | POA: Diagnosis present

## 2023-06-26 DIAGNOSIS — Z87891 Personal history of nicotine dependence: Secondary | ICD-10-CM | POA: Diagnosis not present

## 2023-06-26 DIAGNOSIS — I69351 Hemiplegia and hemiparesis following cerebral infarction affecting right dominant side: Secondary | ICD-10-CM | POA: Diagnosis not present

## 2023-06-26 DIAGNOSIS — Z7901 Long term (current) use of anticoagulants: Secondary | ICD-10-CM | POA: Diagnosis not present

## 2023-06-26 LAB — CBC
HCT: 30 % — ABNORMAL LOW (ref 39.0–52.0)
Hemoglobin: 9.9 g/dL — ABNORMAL LOW (ref 13.0–17.0)
MCH: 32.8 pg (ref 26.0–34.0)
MCHC: 33 g/dL (ref 30.0–36.0)
MCV: 99.3 fL (ref 80.0–100.0)
Platelets: 128 10*3/uL — ABNORMAL LOW (ref 150–400)
RBC: 3.02 MIL/uL — ABNORMAL LOW (ref 4.22–5.81)
RDW: 15.1 % (ref 11.5–15.5)
WBC: 7.3 10*3/uL (ref 4.0–10.5)
nRBC: 0 % (ref 0.0–0.2)

## 2023-06-26 LAB — ECHOCARDIOGRAM COMPLETE
Area-P 1/2: 2.6 cm2
Height: 72 in
S' Lateral: 3 cm
Weight: 2797.2 [oz_av]

## 2023-06-26 LAB — COMPREHENSIVE METABOLIC PANEL WITH GFR
ALT: 36 U/L (ref 0–44)
AST: 29 U/L (ref 15–41)
Albumin: 3.6 g/dL (ref 3.5–5.0)
Alkaline Phosphatase: 134 U/L — ABNORMAL HIGH (ref 38–126)
Anion gap: 12 (ref 5–15)
BUN: 58 mg/dL — ABNORMAL HIGH (ref 8–23)
CO2: 18 mmol/L — ABNORMAL LOW (ref 22–32)
Calcium: 8.8 mg/dL — ABNORMAL LOW (ref 8.9–10.3)
Chloride: 111 mmol/L (ref 98–111)
Creatinine, Ser: 3.21 mg/dL — ABNORMAL HIGH (ref 0.61–1.24)
GFR, Estimated: 17 mL/min — ABNORMAL LOW (ref >60)
Glucose, Bld: 147 mg/dL — ABNORMAL HIGH (ref 70–99)
Potassium: 4 mmol/L (ref 3.5–5.1)
Sodium: 141 mmol/L (ref 135–145)
Total Bilirubin: 0.9 mg/dL (ref 0.0–1.2)
Total Protein: 7 g/dL (ref 6.5–8.1)

## 2023-06-26 MED ORDER — FUROSEMIDE 10 MG/ML IJ SOLN
60.0000 mg | Freq: Two times a day (BID) | INTRAMUSCULAR | Status: DC
  Administered 2023-06-26 – 2023-06-27 (×3): 60 mg via INTRAVENOUS
  Filled 2023-06-26 (×3): qty 6

## 2023-06-26 MED ORDER — FUROSEMIDE 10 MG/ML IJ SOLN
40.0000 mg | Freq: Two times a day (BID) | INTRAMUSCULAR | Status: DC
  Filled 2023-06-26: qty 4

## 2023-06-26 MED ORDER — HYDRALAZINE HCL 20 MG/ML IJ SOLN
10.0000 mg | Freq: Four times a day (QID) | INTRAMUSCULAR | Status: DC | PRN
  Administered 2023-06-27: 10 mg via INTRAVENOUS
  Filled 2023-06-26: qty 1

## 2023-06-26 NOTE — Telephone Encounter (Signed)
 Appears he is in the hospital with CHF exacerbation and they are going to get palliative consult-I think that is a great idea and that should give us  some direction when he leaves the hospital.  Please call family/daughter to let them know we are following along

## 2023-06-26 NOTE — Plan of Care (Signed)
  Problem: Education: Goal: Knowledge of General Education information will improve Description: Including pain rating scale, medication(s)/side effects and non-pharmacologic comfort measures Outcome: Progressing   Problem: Coping: Goal: Level of anxiety will decrease Outcome: Progressing   Problem: Elimination: Goal: Will not experience complications related to bowel motility Outcome: Progressing   

## 2023-06-26 NOTE — Progress Notes (Signed)
 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 - In the ED hypertensive, 197/77, troponin 25, 20, BNP 900, creatinine 3.2, hemoglobin 9.7, chest x-ray with cardiac enlargement, pulmonary vascular congestion, perihilar edema and small pleural effusions  Subjective: -Feels fair, denies significant complaints but extremely limited historian with aphasia  Assessment and Plan:  Acute on chronic diastolic CHF Hypertensive urgency - Last echo 3/23 with EF 60-65%, indeterminate diastolic function, moderately elevated PASP, mildly reduced RV - Continue IV Lasix , increased dose to 60 Mg twice daily -Continue hydralazine -Repeat echo ordered on admission, will follow-up   Elevated troponin-secondary to demand ischemia - No ACS.   Chronic atrial fibrillation History of chronic bradycardia -Eliquis  dose decreased   Chronic thrombocytopenia - Stable, monitor   Acute kidney injury superimposed CKD stage IV -Elevated creatinine 3.24.  Baseline creatinines around 2.5-3.1 - Diuretics as above, monitor, avoid hypotension, he is not a candidate for dialysis -Would benefit from palliative care discussions for goals of care    History of CAD status post CABG in mid 90s - Continue Eliquis  and Crestor .   Hyperlipidemia -Continue Crestor    Chronic depression -Continue Lexapro    Memory loss,?  Early dementia -Continue memantine    GERD -Continue Protonix      DVT prophylaxis:  Eliquis  Code Status: Full code Family Communication:  No family at bedside, will update daughter Disposition Plan:   Consultants:    Procedures:   Antimicrobials:    Objective: Vitals:   06/26/23 0003 06/26/23 0500 06/26/23 0501 06/26/23 0750  BP: (!) 158/93  (!) 158/116 (!) 146/55  Pulse: 69  (!) 55 (!) 56  Resp: 19  20 (!) 22  Temp: 97.7 F (36.5 C)  97.7 F (36.5 C) 97.8 F (36.6 C)  TempSrc: Oral  Oral Oral  SpO2: 94%  95% 91%  Weight: 78.1 kg 79.3 kg    Height:        Intake/Output Summary (Last 24 hours) at 06/26/2023 1015 Last data filed at 06/26/2023 0845 Gross per 24 hour  Intake 103 ml  Output 1300 ml  Net -1197 ml   Filed Weights   06/25/23 2136 06/26/23 0003 06/26/23 0500  Weight: 78 kg 78.1 kg 79.3 kg    Examination:  General exam: Chronically ill elderly male laying in bed, AAO x2, aphasic HEENT: Positive JVD CVS: S1-S2, irregular rhythm Lungs: Few basilar Rales Abdomen: Soft, nontender, bowel sounds present Ext: Trace edema Psych: Poor insight and judgment    Data Reviewed:   CBC: Recent Labs  Lab 06/25/23 1412 06/26/23 0311  WBC 5.2 7.3  HGB 9.7* 9.9*  HCT 29.9* 30.0*  MCV 100.0 99.3  PLT 121* 128*   Basic Metabolic Panel: Recent Labs  Lab 06/25/23 1412 06/26/23 0311  NA 140 141  K 4.0 4.0  CL 108 111  CO2 20* 18*  GLUCOSE 108* 147*  BUN 61* 58*  CREATININE 3.24* 3.21*  CALCIUM  8.8* 8.8*   GFR: Estimated Creatinine Clearance: 16.1 mL/min (A) (by C-G formula based on SCr of 3.21 mg/dL (H)). Liver Function Tests: Recent Labs  Lab 06/26/23 [phone]  AST 29  ALT 36  ALKPHOS 134*  BILITOT 0.9  PROT 7.0  ALBUMIN 3.6   No results for input(s): "LIPASE", "AMYLASE" in the last 168 hours. No results for input(s): "AMMONIA" in the last 168 hours. Coagulation Profile: No results for input(s): "INR", "PROTIME" in the last 168 hours. Cardiac Enzymes: No results for input(s): "CKTOTAL", "CKMB", "CKMBINDEX", "TROPONINI" in the last 168 hours. BNP (last 3 results) No results for  input(s): "PROBNP" in the last 8760 hours. HbA1C: No results for input(s): "HGBA1C" in the last 72 hours. CBG: Recent Labs  Lab 06/25/23 1412  GLUCAP 98   Lipid Profile: No results for input(s): "CHOL", "HDL", "LDLCALC", "TRIG", "CHOLHDL", "LDLDIRECT" in the last 72 hours. Thyroid  Function Tests: No results for input(s): "TSH", "T4TOTAL", "FREET4", "T3FREE", "THYROIDAB" in the last 72 hours. Anemia Panel: No results for input(s): "VITAMINB12", "FOLATE", "FERRITIN", "TIBC", "IRON", "RETICCTPCT" in the last 72 hours. Urine analysis:    Component Value Date/Time   COLORURINE STRAW (A) 06/25/2023 1412   APPEARANCEUR CLEAR 06/25/2023 1412   LABSPEC 1.010 06/25/2023 1412   PHURINE 5.0 06/25/2023 1412   GLUCOSEU NEGATIVE 06/25/2023 1412   GLUCOSEU NEGATIVE 10/01/2017 1409   HGBUR NEGATIVE 06/25/2023 1412   BILIRUBINUR NEGATIVE 06/25/2023 1412   BILIRUBINUR neg 05/10/2021 1710   KETONESUR NEGATIVE 06/25/2023 1412   PROTEINUR 100 (A) 06/25/2023 1412   UROBILINOGEN 0.2 05/10/2021 1710   UROBILINOGEN 0.2 10/01/2017 1409   NITRITE NEGATIVE 06/25/2023 1412   LEUKOCYTESUR NEGATIVE 06/25/2023 1412   Sepsis Labs: @LABRCNTIP (procalcitonin:4,lacticidven:4)  )No results found for this or any previous visit (from the past 240 hours).   Radiology Studies: DG Chest 2 View Result Date: 06/25/2023 CLINICAL DATA:  Weakness and heavier breathing. Shortness of breath. Aphasia baseline. EXAM: CHEST - 2 VIEW COMPARISON:  07/09/2011 FINDINGS: Postoperative changes in the mediastinum. Cardiac enlargement. Pulmonary vascular congestion. Mild perihilar infiltration likely edema. Small bilateral pleural effusions. No pneumothorax. Mediastinal contours appear intact. Calcification of the aorta. IMPRESSION: Congestive changes suggested with cardiac enlargement, pulmonary vascular congestion, mild perihilar edema, and small pleural effusions. Electronically Signed   By: Boyce Byes M.D.   On: 06/25/2023  16:14     Scheduled Meds:  amLODipine   5 mg Oral Daily   apixaban   2.5 mg Oral BID   doxazosin   4 mg Oral Daily   escitalopram   20 mg Oral Daily   ferrous sulfate  325 mg Oral Daily   furosemide   60 mg Intravenous BID   memantine   10 mg Oral Daily   pantoprazole   20 mg Oral Daily   rosuvastatin   20 mg Oral Daily   sodium chloride  flush  3 mL Intravenous Q12H   sodium chloride  flush  3 mL Intravenous Q12H   Continuous Infusions:  sodium chloride        LOS: 0 days    Time spent:    Deforest Fast, MD [facility]   06/26/2023, 10:15 AM

## 2023-06-26 NOTE — TOC CM/SW Note (Addendum)
 Transition of Care [facility]) - Inpatient Brief Assessment   Patient Details  Name: [name] MRN: [id number] Date of Birth: Nov 20, 1928  [facility] CM/SW Contact:    Jennett Model, RN Phone Number: 06/26/2023, 1:31 PM   Clinical Narrative: Patient gives this NCM permission to speak with daughter who is the POA, From [facility], has PCP and insurance on file, states has no HH services in place at this time, has rollator at home.  Daughter will transport them home at dc and she  is support system, states gets medications from [facility] pharmacy.  Pta self ambulatory with rollator.  This NCM received consult to set up home hospice,  NCM offered choice to Daughter she states a family member had [facility] in the past so she would like [facility].  NCM made referral to Madelene Schanz with [facility] for home hospice. Patient will have 24 hr care per daughter , who is the POA.   Transition of Care Asessment: Insurance and Status: Insurance coverage has been reviewed Patient has primary care physician: Yes Home environment has been reviewed: from [employer] [facility] Prior level of function:: ambulates with rollator Prior/Current Home Services: Current home services (aide that is there from 9 to 5 , and now will be there 24 hrs) Social Drivers of Health Review: SDOH reviewed no interventions necessary Readmission risk has been reviewed: Yes Transition of care needs: transition of care needs identified, TOC will continue to follow

## 2023-06-26 NOTE — Progress Notes (Signed)
 Heart Failure Navigator Progress Note  Assessed for Heart & Vascular TOC clinic readiness.  Patient does not meet criteria due to plan for discharge with home Hospice.   Navigator will sign off at this time.   Randie Bustle, BSN, Scientist, clinical (histocompatibility and immunogenetics) Only

## 2023-06-26 NOTE — Telephone Encounter (Signed)
 Called and spoke with Verna and below message given.

## 2023-06-26 NOTE — Progress Notes (Signed)
 [place] [facility] Liaison Note  Received request from Bernardo Bridgeman, Transitions of Care Manager, for hospice services in facility after discharge. Spoke with daughter, Verna, to initiate education related to hospice philosophy, services, and team approach to care. Patient/family verbalized understanding of information given. Per discussion, the plan is for discharge to facility tomorrow or the next day.  DME needs discussed. Currently there are no DME needs.   Please send signed and completed DNR home with patient/family.   Please provide prescriptions at discharge as needed to ensure ongoing symptom management.   [facility] information and contact numbers given to Verna. Above information shared with Bernardo Bridgeman, Transitions of Care Manager.   Please call with any questions or concerns.   Thank you for the opportunity to participate in this patient's care.   Madelene Schanz BSN, Charity fundraiser, OCN [employer] [phone]

## 2023-06-26 NOTE — Consult Note (Signed)
 Consultation Note Date: 06/26/2023   Patient Name: [name]  DOB: 16-Apr-1928  MRN: [id number]  Age / Sex: 89 y.o., male  PCP: Almira Jaeger, MD Referring Physician: Deforest Fast, MD  Reason for Consultation: Establishing goals of care  HPI/Patient Profile: 89 y.o. male   admitted on 06/25/2023 with    medical history significant of persistent atrial fibrillation on Eliquis , CAD status post CABG in mid 90s, CVA, diastolic heart failure preserved EF, essential hypertension, hyperlipidemia, chronic thrombocytopenia, BPH, iron deficiency anemia, CKD stage IV, chronic depression and age-related frailty presented to emergency department complaining of shortness of breath and not as active as baseline.   Patient's daughter at the bedside reported that she has been noticing patient has more progressive weakness with associated shortness of breath.  Patient reported feeling sensation of chest pressure, orthopnea dyspnea and PND.  Denies any bilateral lower extremity swelling. Denies any chest pain, diaphoresis, nausea, headache, blurry vision, abdominal pain, constipation or diarrhea.  Patient and family face treatment option decisions, advanced directive decisions and anticipatory care needs.   Clinical Assessment and Goals of Care:  This NP Thena Fireman reviewed medical records, received report from team, assessed the patient and then meet at the patient's bedside  to discuss diagnosis, prognosis, GOC, EOL wishes disposition and options.   Concept of Palliative Care was introduced as specialized medical care for people and their families living with serious illness.  If focuses on providing relief from the symptoms and stress of a serious illness.  The goal is to improve quality of life for both the patient and the family.  Created space and opportunity for patient  and family to explore thoughts and  feelings regarding current medical situation     A  discussion was had today regarding advanced directives.  Concepts specific to code status, artifical feeding and hydration, continued IV antibiotics and rehospitalization was had.  The difference between a aggressive medical intervention path  and a palliative comfort care path for this patient at this time was had.  Values and goals of care important to patient and family were attempted to be elicited.   MOST form    Natural trajectory and expectations at EOL were discussed.  Questions and concerns addressed.  Patient  encouraged to call with questions or concerns.     PMT will continue to support holistically.           Daughter is H POA      SUMMARY OF RECOMMENDATIONS    Code Status/Advance Care Planning: Limited code    Palliative Prophylaxis:  Aspiration, Bowel Regimen, Delirium Protocol, and Frequent Pain Assessment  Additional Recommendations (Limitations, Scope, Preferences): Avoid Hospitalization and No Hemodialysis  Psycho-social/Spiritual:  Desire for further Chaplaincy support:no Additional Recommendations: Education on Hospice  Prognosis:  < 6 months  Discharge Planning: Home with Hospice      Primary Diagnoses: Present on Admission:  Chronic atrial fibrillation (HCC)  CKD (chronic kidney disease), stage IV (HCC)  GERD  Thrombocytopenia (HCC)  Venous insufficiency  Bradycardia  Essential hypertension  Hyperlipidemia  Acute on chronic diastolic (congestive) heart failure (HCC)   I have reviewed the medical record, interviewed the patient and family, and examined the patient. The following aspects are pertinent.  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)    Persistent atrial fibrillation (HCC)    Pulmonary hypertension (HCC)    by echo 2013   Rosacea    Situational depression    Severe   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   treated after biopsy   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   KA - treated after biopsy   Tricuspid regurgitation    Social History   Socioeconomic History   Marital status: Widowed    Spouse name: Not on file   Number of children: Not on file   Years of education: Not on file   Highest education level: Not on file  Occupational History   Not on file  Tobacco Use   Smoking status: Former   Smokeless tobacco: Never   Tobacco comments:    x 89 yo per the dtr  Substance and Sexual Activity   Alcohol use: Yes    Alcohol/week: 1.0 standard drink of alcohol    Types: 1 Glasses of wine per week    Comment: glass of wine at 7   Drug use: No   Sexual activity: Not Currently  Other Topics Concern   Not on file  Social History Narrative   Aide 9-5   Widower. Lost spouse December 2023. Lives at a house at a retirement home- independent but they bring meals. 3 children. 7 grandkids. 8 greatgrandkids.       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 [employer]   Social Drivers of Health   Financial Resource Strain: Low Risk  (07/13/2022)   Overall Financial Resource Strain (CARDIA)    Difficulty of Paying Living Expenses: Not hard at all  Food Insecurity: Unknown (06/25/2023)   Hunger Vital Sign    Worried About Running Out of Food in the Last Year: Never true    Ran Out of Food in the Last Year: Patient unable to answer  Transportation Needs: No  Transportation Needs (06/25/2023)   PRAPARE - Administrator, Civil Service (Medical): No    Lack of Transportation (Non-Medical): No  Physical Activity: Inactive (07/13/2022)   Exercise Vital Sign    Days of Exercise per Week: 0 days    Minutes of Exercise per Session: 0 min  Stress: No Stress Concern Present (07/13/2022)   [employer] of Occupational Health - Occupational Stress Questionnaire    Feeling of Stress : Not at all  Social Connections: Unknown (06/25/2023)   Social Connection and Isolation Panel [NHANES]    Frequency of Communication with Friends and Family: Never  Frequency of Social Gatherings with Friends and Family: Patient unable to answer    Attends Religious Services: Never    Database administrator or Organizations: No    Attends Banker Meetings: Never    Marital Status: Widowed   Family History  Problem Relation Age of Onset   Heart disease Father    Heart attack Other        fhx   Coronary artery disease Other        fhx   Scheduled Meds:  amLODipine   5 mg Oral Daily   apixaban   2.5 mg Oral BID   doxazosin   4 mg Oral Daily   escitalopram   20 mg Oral Daily   ferrous sulfate  325 mg Oral Daily   furosemide   60 mg Intravenous BID   memantine   10 mg Oral Daily   pantoprazole   20 mg Oral Daily   rosuvastatin   20 mg Oral Daily   sodium chloride  flush  3 mL Intravenous Q12H   sodium chloride  flush  3 mL Intravenous Q12H   Continuous Infusions:  sodium chloride      PRN Meds:.sodium chloride , acetaminophen  **OR** acetaminophen , hydrALAZINE, ondansetron  **OR** ondansetron  (ZOFRAN ) IV, sodium chloride  flush Medications Prior to Admission:  Prior to Admission medications   Medication Sig Start Date End Date Taking? Authorizing Provider  amLODipine  (NORVASC ) 5 MG tablet TAKE 1 TABLET BY MOUTH ONCE DAILY 08/14/22  Yes Almira Jaeger, MD  apixaban  (ELIQUIS ) 2.5 MG TABS tablet Take 1 tablet (2.5 mg total) by mouth 2 (two) times  daily. 08/16/22  Yes Almira Jaeger, MD  doxazosin  (CARDURA ) 4 MG tablet Take 1 tablet (4 mg total) by mouth daily. 08/14/22  Yes Almira Jaeger, MD  escitalopram  (LEXAPRO ) 20 MG tablet Take 1 tablet (20 mg total) by mouth daily. 08/16/22  Yes Almira Jaeger, MD  ferrous sulfate 325 (65 FE) MG tablet Take 325 mg by mouth daily with breakfast.   Yes [provider]  furosemide  (LASIX ) 40 MG tablet Take 1 tablet (40 mg total) by mouth 2 (two) times daily. 08/15/22  Yes Almira Jaeger, MD  memantine  (NAMENDA ) 10 MG tablet Take 1 tablet (10 mg total) by mouth daily. 04/20/23  Yes Almira Jaeger, MD  rosuvastatin  (CRESTOR ) 20 MG tablet TAKE 1 TABLET BY MOUTH ONCE DAILY Patient taking differently: Take 20 mg by mouth at bedtime. 08/14/22  Yes Almira Jaeger, MD  lansoprazole (PREVACID SOLUTAB) 15 MG disintegrating tablet Take 15 mg by mouth 2 (two) times daily as needed (for heartburn- dissolve orally).    [provider]   Allergies  Allergen Reactions   Ciprofloxacin Hcl Other (See Comments)    Reaction unknown by family   Sulfamethoxazole-Trimethoprim Other (See Comments)    Reaction unknown by family   Review of Systems  Unable to perform ROS: Patient nonverbal    Physical Exam Cardiovascular:     Rate and Rhythm: Normal rate.  Pulmonary:     Effort: Pulmonary effort is normal.  Skin:    General: Skin is warm and dry.  Neurological:     Mental Status: He is alert.     Vital Signs: BP (!) 146/55 (BP Location: Right Arm)   Pulse (!) 56   Temp 97.8 F (36.6 C) (Oral)   Resp (!) 22   Ht 6' (1.829 m)   Wt 79.3 kg   SpO2 91%   BMI 23.71 kg/m  Pain Scale: 0-10   Pain Score:  0-No pain   SpO2: SpO2: 91 % O2 Device:SpO2: 91 % O2 Flow Rate: .   IO: Intake/output summary:  Intake/Output Summary (Last 24 hours) at 06/26/2023 1002 Last data filed at 06/26/2023 0845 Gross per 24 hour  Intake 103 ml  Output 1300 ml  Net -1197 ml    LBM: Last BM  Date : 06/26/23 Baseline Weight: Weight: 78 kg Most recent weight: Weight: 79.3 kg     Palliative Assessment/Data:     Time 75 minutes  Discussed with Dr. Drexel Gentles and treatment team  Signed by: Thena Fireman, NP   Please contact Palliative Medicine Team phone at [phone] for questions and concerns.  For individual provider: See Tilford Foley

## 2023-06-27 DIAGNOSIS — I5033 Acute on chronic diastolic (congestive) heart failure: Secondary | ICD-10-CM | POA: Diagnosis not present

## 2023-06-27 LAB — BASIC METABOLIC PANEL WITH GFR
Anion gap: 11 (ref 5–15)
BUN: 57 mg/dL — ABNORMAL HIGH (ref 8–23)
CO2: 19 mmol/L — ABNORMAL LOW (ref 22–32)
Calcium: 8.4 mg/dL — ABNORMAL LOW (ref 8.9–10.3)
Chloride: 109 mmol/L (ref 98–111)
Creatinine, Ser: 3.34 mg/dL — ABNORMAL HIGH (ref 0.61–1.24)
GFR, Estimated: 17 mL/min — ABNORMAL LOW (ref >60)
Glucose, Bld: 212 mg/dL — ABNORMAL HIGH (ref 70–99)
Potassium: 3.3 mmol/L — ABNORMAL LOW (ref 3.5–5.1)
Sodium: 139 mmol/L (ref 135–145)

## 2023-06-27 NOTE — Discharge Summary (Signed)
 Physician Discharge Summary  [name] MVH:[id number] DOB: Jul 26, 1928 DOA: 06/25/2023  PCP: Almira Jaeger, MD  Admit date: 06/25/2023 Discharge date: 06/27/2023  Time spent: 45 minutes  Recommendations for Outpatient Follow-up:  Home with hospice services for symptom and comfort focused care   Discharge Diagnoses:  Principal Problem:   Acute on chronic diastolic (congestive) heart failure (HCC) Severe pulmonary hypertension   Hypertensive urgency   Elevated troponin   Hyperlipidemia   Essential hypertension   GERD   Chronic atrial fibrillation (HCC)   Bradycardia   Thrombocytopenia (HCC)   CKD (chronic kidney disease), stage IV (HCC)   Venous insufficiency   History of CAD (coronary artery disease)   Chronic depression   Acute kidney injury superimposed on chronic kidney disease (HCC)   CHF (congestive heart failure) (HCC)   Discharge Condition: Fair but guarded prognosis  Diet recommendation: Low-sodium  Filed Weights   06/26/23 0500 06/27/23 0043 06/27/23 0500  Weight: 79.3 kg 75.6 kg 75.6 kg    History of present illness:  89/M w  Aphasia, persistent atrial fibrillation on Eliquis , CAD status post CABG in mid 90s, CVA, diastolic CHF, pulmonary hypertension  chronic thrombocytopenia, BPH, iron deficiency anemia, CKD stage IV, chronic depression and age-related frailty presented to emergency department complaining of shortness of breath and fatigue as reported by daughter, unable to obtain meaningful history from the patient. - In the ED hypertensive, 197/77, troponin 25, 20, BNP 900, creatinine 3.2, hemoglobin 9.7, chest x-ray with cardiac enlargement, pulmonary vascular congestion, perihilar edema and small pleural effusions  Hospital Course:   Acute on chronic diastolic CHF Severe pulmonary hypertension Hypertensive urgency - Last echo 3/23 with EF 60-65%, indeterminate diastolic function, moderately elevated PASP, mildly reduced RV -Repeat echo with  increased EF, 70-75%, RV pressure overload, mildly reduced RV, severely elevated PASP -Admitted with volume overload, diuresed with IV Lasix , volume status improving, Continue hydralazine - GDMT limited by AKI/CKD 4, chronically ill elderly debilitated and aphasic with some memory deficits, palliative consulted for goals of care, family meeting completed, plan for discharge back home with hospice services, now DNR  Acute kidney injury superimposed CKD stage IV -Elevated creatinine 3.24.  Baseline creatinines around 2.5-3.1 - Diuretics as above, monitor, avoid hypotension, he is not a candidate for dialysis - Now DNR, plan to add hospice at discharge   Elevated troponin-secondary to demand ischemia - No ACS.   Chronic atrial fibrillation History of chronic bradycardia -Eliquis  dose decreased   Chronic thrombocytopenia - Stable, monitor    History of CAD status post CABG in mid 90s - Continue Eliquis  and Crestor .  CVA Aphasia - Continue Eliquis    Hyperlipidemia -Continue Crestor    Chronic depression -Continue Lexapro    Memory loss,?  Early dementia -Continue memantine    GERD -Continue Protonix   Discharge Exam: Vitals:   06/27/23 0601 06/27/23 0735  BP:  (!) 172/52  Pulse:  67  Resp: 20 (!) 21  Temp:  97.9 F (36.6 C)  SpO2:  93%   General exam: Chronically ill elderly male laying in bed, AAO x2, aphasic HEENT: no  JVD CVS: S1-S2, irregular rhythm Lungs: Few basilar Rales Abdomen: Soft, nontender, bowel sounds present Ext: Trace edema Psych: Poor insight and judgment  Discharge Instructions   Discharge Instructions     Diet - low sodium heart healthy   Complete by: As directed    Increase activity slowly   Complete by: As directed       Allergies as of 06/27/2023  Reactions   Ciprofloxacin Hcl Other (See Comments)   Reaction unknown by family   Sulfamethoxazole-trimethoprim Other (See Comments)   Reaction unknown by family         Medication List     STOP taking these medications    ferrous sulfate 325 (65 FE) MG tablet       TAKE these medications    amLODipine  5 MG tablet Commonly known as: NORVASC  TAKE 1 TABLET BY MOUTH ONCE DAILY   apixaban  2.5 MG Tabs tablet Commonly known as: Eliquis  Take 1 tablet (2.5 mg total) by mouth 2 (two) times daily.   doxazosin  4 MG tablet Commonly known as: Cardura  Take 1 tablet (4 mg total) by mouth daily.   escitalopram  20 MG tablet Commonly known as: LEXAPRO  Take 1 tablet (20 mg total) by mouth daily.   furosemide  40 MG tablet Commonly known as: LASIX  Take 1 tablet (40 mg total) by mouth 2 (two) times daily.   lansoprazole 15 MG disintegrating tablet Commonly known as: PREVACID SOLUTAB Take 15 mg by mouth 2 (two) times daily as needed (for heartburn- dissolve orally).   memantine  10 MG tablet Commonly known as: NAMENDA  Take 1 tablet (10 mg total) by mouth daily.   rosuvastatin  20 MG tablet Commonly known as: CRESTOR  TAKE 1 TABLET BY MOUTH ONCE DAILY What changed:  how much to take how to take this when to take this additional instructions       Allergies  Allergen Reactions   Ciprofloxacin Hcl Other (See Comments)    Reaction unknown by family   Sulfamethoxazole-Trimethoprim Other (See Comments)    Reaction unknown by family    Follow-up Information     [facility] Follow up.   Why: Home Hospice Contact information: [address] [place] [address] [phone]                  The results of significant diagnostics from this hospitalization (including imaging, microbiology, ancillary and laboratory) are listed below for reference.    Significant Diagnostic Studies: ECHOCARDIOGRAM COMPLETE Result Date: 06/26/2023    ECHOCARDIOGRAM REPORT   Patient Name:   [name] Date of Exam: 06/26/2023 Medical Rec #:  [id number]      Height:       72.0 in Accession #:    [id number]     Weight:       174.8 lb Date of  Birth:  [birth year]      BSA:          2.012 m Patient Age:    89 years       BP:           158/116 mmHg Patient Gender: M              HR:           57 bpm. Exam Location:  Inpatient Procedure: 2D Echo, Color Doppler and Cardiac Doppler (Both Spectral and Color            Flow Doppler were utilized during procedure). Indications:    I50.31 Acute diastolic (congestive) heart failure  History:        Patient has prior history of Echocardiogram examinations, most                 recent 05/16/2021. CAD, Prior CABG, CKD, Arrythmias:Atrial                 Fibrillation; Risk Factors:Hypertension and Dyslipidemia.  Sonographer:    Sherline Distel Senior  RDCS Referring Phys: [id number] SUBRINA SUNDIL IMPRESSIONS  1. Left ventricular ejection fraction, by estimation, is 70 to 75%. Left ventricular ejection fraction by PLAX is 73 %. The left ventricle has hyperdynamic function. The left ventricle has no regional wall motion abnormalities. There is mild concentric left ventricular hypertrophy. There is the interventricular septum is flattened in systole and diastole, consistent with right ventricular pressure and volume overload.  2. Right ventricular systolic function is mildly reduced. The right ventricular size is severely enlarged. There is severely elevated pulmonary artery systolic pressure. The estimated right ventricular systolic pressure is 65.1 mmHg.  3. Left atrial size was severely dilated.  4. Right atrial size was severely dilated.  5. The mitral valve is normal in structure. Mild mitral valve regurgitation.  6. The tricuspid valve is abnormal. Tricuspid valve regurgitation is moderate.  7. The aortic valve is tricuspid. There is mild calcification of the aortic valve. Aortic valve regurgitation is trivial. Aortic valve sclerosis/calcification is present, without any evidence of aortic stenosis.  8. The inferior vena cava is dilated in size with <50% respiratory variability, suggesting right atrial pressure of 15 mmHg. FINDINGS   Left Ventricle: Left ventricular ejection fraction, by estimation, is 70 to 75%. Left ventricular ejection fraction by PLAX is 73 %. The left ventricle has hyperdynamic function. The left ventricle has no regional wall motion abnormalities. The left ventricular internal cavity size was normal in size. There is mild concentric left ventricular hypertrophy. The interventricular septum is flattened in systole and diastole, consistent with right ventricular pressure and volume overload. Left ventricular  diastolic function could not be evaluated due to atrial fibrillation. Right Ventricle: The right ventricular size is severely enlarged. No increase in right ventricular wall thickness. Right ventricular systolic function is mildly reduced. There is severely elevated pulmonary artery systolic pressure. The tricuspid regurgitant velocity is 3.54 m/s, and with an assumed right atrial pressure of 15 mmHg, the estimated right ventricular systolic pressure is 65.1 mmHg. Left Atrium: Left atrial size was severely dilated. Right Atrium: Right atrial size was severely dilated. Pericardium: There is no evidence of pericardial effusion. Mitral Valve: The mitral valve is normal in structure. Mild mitral annular calcification. Mild mitral valve regurgitation, with posteriorly-directed jet. Tricuspid Valve: The tricuspid valve is abnormal. Tricuspid valve regurgitation is moderate. Aortic Valve: The aortic valve is tricuspid. There is mild calcification of the aortic valve. Aortic valve regurgitation is trivial. Aortic valve sclerosis/calcification is present, without any evidence of aortic stenosis. Pulmonic Valve: The pulmonic valve was normal in structure. Pulmonic valve regurgitation is trivial. No evidence of pulmonic stenosis. Aorta: The aortic root and ascending aorta are structurally normal, with no evidence of dilitation. Venous: The inferior vena cava is dilated in size with less than 50% respiratory variability,  suggesting right atrial pressure of 15 mmHg. IAS/Shunts: No atrial level shunt detected by color flow Doppler.  LEFT VENTRICLE PLAX 2D LV EF:         Left            Diastology                ventricular     LV e' medial:    7.93 cm/s                ejection        LV E/e' medial:  11.0                fraction by     LV e' lateral:  11.30 cm/s                PLAX is 73      LV E/e' lateral: 7.7                %. LVIDd:         5.20 cm LVIDs:         3.00 cm LV PW:         1.10 cm LV IVS:        1.10 cm LVOT diam:     2.10 cm LV SV:         64 LV SV Index:   32 LVOT Area:     3.46 cm  RIGHT VENTRICLE RV S prime:     6.84 cm/s TAPSE (M-mode): 1.4 cm LEFT ATRIUM              Index        RIGHT ATRIUM           Index LA diam:        5.90 cm  2.93 cm/m   RA Area:     34.60 cm LA Vol (A2C):   112.0 ml 55.66 ml/m  RA Volume:   123.00 ml 61.13 ml/m LA Vol (A4C):   117.0 ml 58.15 ml/m LA Biplane Vol: 114.0 ml 56.66 ml/m  AORTIC VALVE LVOT Vmax:   71.40 cm/s LVOT Vmean:  49.000 cm/s LVOT VTI:    0.185 m  AORTA Ao Root diam: 3.00 cm Ao Asc diam:  3.20 cm MITRAL VALVE               TRICUSPID VALVE MV Area (PHT): 2.60 cm    TR Peak grad:   50.1 mmHg MV Decel Time: 292 msec    TR Vmax:        354.00 cm/s MV E velocity: 87.00 cm/s                            SHUNTS                            Systemic VTI:  0.18 m                            Systemic Diam: 2.10 cm Karyl Paget Croitoru MD Electronically signed by Luana Rumple MD Signature Date/Time: 06/26/2023/2:45:50 PM    Final    DG Chest 2 View Result Date: 06/25/2023 CLINICAL DATA:  Weakness and heavier breathing. Shortness of breath. Aphasia baseline. EXAM: CHEST - 2 VIEW COMPARISON:  07/09/2011 FINDINGS: Postoperative changes in the mediastinum. Cardiac enlargement. Pulmonary vascular congestion. Mild perihilar infiltration likely edema. Small bilateral pleural effusions. No pneumothorax. Mediastinal contours appear intact. Calcification of the aorta. IMPRESSION:  Congestive changes suggested with cardiac enlargement, pulmonary vascular congestion, mild perihilar edema, and small pleural effusions. Electronically Signed   By: Boyce Byes M.D.   On: 06/25/2023 16:14    Microbiology: No results found for this or any previous visit (from the past 240 hours).   Labs: Basic Metabolic Panel: Recent Labs  Lab 06/25/23 1412 06/26/23 0311  NA 140 141  K 4.0 4.0  CL 108 111  CO2 20* 18*  GLUCOSE 108* 147*  BUN 61* 58*  CREATININE 3.24* 3.21*  CALCIUM  8.8* 8.8*   Liver Function Tests: Recent Labs  Lab 06/26/23 [phone]  AST 29  ALT 36  ALKPHOS 134*  BILITOT 0.9  PROT 7.0  ALBUMIN 3.6   No results for input(s): "LIPASE", "AMYLASE" in the last 168 hours. No results for input(s): "AMMONIA" in the last 168 hours. CBC: Recent Labs  Lab 06/25/23 1412 06/26/23 0311  WBC 5.2 7.3  HGB 9.7* 9.9*  HCT 29.9* 30.0*  MCV 100.0 99.3  PLT 121* 128*   Cardiac Enzymes: No results for input(s): "CKTOTAL", "CKMB", "CKMBINDEX", "TROPONINI" in the last 168 hours. BNP: BNP (last 3 results) Recent Labs    06/25/23 1412  BNP 896.6*    ProBNP (last 3 results) No results for input(s): "PROBNP" in the last 8760 hours.  CBG: Recent Labs  Lab 06/25/23 1412  GLUCAP 98       Signed:  Deforest Fast MD.  [facility] 06/27/2023, 9:28 AM

## 2023-06-27 NOTE — Plan of Care (Signed)

## 2023-06-27 NOTE — TOC Transition Note (Addendum)
 Transition of Care [facility]) - Discharge Note   Patient Details  Name: [name] MRN: [id number] Date of Birth: [birth year]  [facility] CM/SW Contact:  Jennett Model, RN Phone Number: 06/27/2023, 10:20 AM   Clinical Narrative:    For dc today, patient  going home with home hospice,  daughter will transport him home, she will pick him up in an hour. NCM notified Shawn and Melissa with [facility].          Patient Goals and CMS Choice            Discharge Placement                       Discharge Plan and Services Additional resources added to the After Visit Summary for                                       Social Drivers of Health (SDOH) Interventions SDOH Screenings   Food Insecurity: Unknown (06/25/2023)  Housing: Unknown (06/25/2023)  Transportation Needs: No Transportation Needs (06/25/2023)  Utilities: Patient Unable To Answer (06/25/2023)  Depression (PHQ2-9): Low Risk  (04/23/2023)  Financial Resource Strain: Low Risk  (07/13/2022)  Physical Activity: Inactive (07/13/2022)  Social Connections: Unknown (06/25/2023)  Stress: No Stress Concern Present (07/13/2022)  Tobacco Use: Medium Risk (06/25/2023)     Readmission Risk Interventions     No data to display

## 2023-06-28 ENCOUNTER — Telehealth: Payer: Self-pay | Admitting: *Deleted

## 2023-06-28 NOTE — Transitions of Care (Post Inpatient/ED Visit) (Signed)
   06/28/2023  Name: [name] MRN: [id number] DOB: 11/11/1928  Today's TOC FU Call Status: Today's TOC FU Call Status:: Successful TOC FU Call Completed TOC FU Call Complete Date: 06/27/23 Patient's Name and Date of Birth confirmed.  Transition Care Management Follow-up Telephone Call Date of Discharge: 06/27/23 Discharge Facility: [facility]) Type of Discharge: Inpatient Admission Primary Inpatient Discharge Diagnosis:: Cardiorenal syndrome (verified with daughter that pt is active with hospice at home, she states they will be at the home today to start services, no concerns voiced)   Cecilie Coffee [facility], BSN RN Care Manager/ Transition of Care [place]/ [facility] [phone]

## 2023-07-09 ENCOUNTER — Encounter: Payer: Self-pay | Admitting: Pharmacist

## 2023-07-09 NOTE — Progress Notes (Signed)
 Pharmacy Quality Measure Review  This patient is appearing on a report for being at risk of failing the adherence measure for cholesterol (statin) medications this calendar year.   Medication: rosuvastatin   Last fill date: 04/23/2023 for 30 day supply  Mr. Crombie resides at [facility]. His meds are filled every 30 days by [facility].    Insurance report was not up to date. No action needed at this time.  Per Dr Anson Basta database - all maintenance medications except lansoprazole were filled 05/21/2023  Cecilie Coffee, PharmD Clinical Pharmacist [facility] Primary Care  Population Health [phone]

## 2023-09-21 ENCOUNTER — Ambulatory Visit: Payer: Medicare Other | Admitting: Family Medicine

## 2023-09-24 ENCOUNTER — Encounter: Payer: Self-pay | Admitting: Pharmacist

## 2023-09-24 NOTE — Progress Notes (Signed)
 Pharmacy Quality Measure Review  This patient is appearing on a report for being at risk of failing the adherence measure for cholesterol (statin) medications this calendar year.   Medication: rosuvastatin    Last fill date: 07/17/2023 for 4 day supply per adherence report  Reviewed recent refill history in Dr Annemarie database. Patient receives medications thru [facility]. Actual last refill date was 08/21/2023 for 28 day supply.  There is a notation that appointment 09/21/2023 was cancelled because patient is on Hospice now.    Insurance report was not up to date. No action needed at this time.   Madelin Ray, PharmD Clinical Pharmacist [facility] Primary Care  Population Health [phone]

## 2023-10-08 ENCOUNTER — Emergency Department (HOSPITAL_COMMUNITY)

## 2023-10-08 ENCOUNTER — Other Ambulatory Visit: Payer: Self-pay

## 2023-10-08 ENCOUNTER — Encounter (HOSPITAL_COMMUNITY): Payer: Self-pay

## 2023-10-08 ENCOUNTER — Inpatient Hospital Stay (HOSPITAL_COMMUNITY)
Admission: EM | Admit: 2023-10-08 | Discharge: 2023-10-29 | DRG: 481 | Disposition: E | Source: Skilled Nursing Facility | Attending: Internal Medicine | Admitting: Internal Medicine

## 2023-10-08 DIAGNOSIS — E870 Hyperosmolality and hypernatremia: Secondary | ICD-10-CM | POA: Diagnosis present

## 2023-10-08 DIAGNOSIS — D631 Anemia in chronic kidney disease: Secondary | ICD-10-CM | POA: Diagnosis present

## 2023-10-08 DIAGNOSIS — Z951 Presence of aortocoronary bypass graft: Secondary | ICD-10-CM

## 2023-10-08 DIAGNOSIS — I5033 Acute on chronic diastolic (congestive) heart failure: Secondary | ICD-10-CM | POA: Diagnosis not present

## 2023-10-08 DIAGNOSIS — M25422 Effusion, left elbow: Secondary | ICD-10-CM | POA: Diagnosis present

## 2023-10-08 DIAGNOSIS — N189 Chronic kidney disease, unspecified: Secondary | ICD-10-CM

## 2023-10-08 DIAGNOSIS — I69320 Aphasia following cerebral infarction: Secondary | ICD-10-CM | POA: Diagnosis not present

## 2023-10-08 DIAGNOSIS — E785 Hyperlipidemia, unspecified: Secondary | ICD-10-CM | POA: Diagnosis present

## 2023-10-08 DIAGNOSIS — I4819 Other persistent atrial fibrillation: Secondary | ICD-10-CM | POA: Diagnosis present

## 2023-10-08 DIAGNOSIS — Y92009 Unspecified place in unspecified non-institutional (private) residence as the place of occurrence of the external cause: Secondary | ICD-10-CM | POA: Diagnosis not present

## 2023-10-08 DIAGNOSIS — I69351 Hemiplegia and hemiparesis following cerebral infarction affecting right dominant side: Secondary | ICD-10-CM | POA: Diagnosis not present

## 2023-10-08 DIAGNOSIS — E861 Hypovolemia: Secondary | ICD-10-CM | POA: Diagnosis present

## 2023-10-08 DIAGNOSIS — D62 Acute posthemorrhagic anemia: Secondary | ICD-10-CM | POA: Diagnosis not present

## 2023-10-08 DIAGNOSIS — I5022 Chronic systolic (congestive) heart failure: Secondary | ICD-10-CM | POA: Diagnosis present

## 2023-10-08 DIAGNOSIS — I272 Pulmonary hypertension, unspecified: Secondary | ICD-10-CM | POA: Diagnosis present

## 2023-10-08 DIAGNOSIS — Z9981 Dependence on supplemental oxygen: Secondary | ICD-10-CM

## 2023-10-08 DIAGNOSIS — W19XXXA Unspecified fall, initial encounter: Secondary | ICD-10-CM | POA: Diagnosis not present

## 2023-10-08 DIAGNOSIS — Z7901 Long term (current) use of anticoagulants: Secondary | ICD-10-CM

## 2023-10-08 DIAGNOSIS — S00212A Abrasion of left eyelid and periocular area, initial encounter: Secondary | ICD-10-CM | POA: Diagnosis present

## 2023-10-08 DIAGNOSIS — E878 Other disorders of electrolyte and fluid balance, not elsewhere classified: Secondary | ICD-10-CM | POA: Diagnosis present

## 2023-10-08 DIAGNOSIS — W1839XA Other fall on same level, initial encounter: Secondary | ICD-10-CM | POA: Diagnosis present

## 2023-10-08 DIAGNOSIS — R6510 Systemic inflammatory response syndrome (SIRS) of non-infectious origin without acute organ dysfunction: Secondary | ICD-10-CM | POA: Diagnosis present

## 2023-10-08 DIAGNOSIS — E782 Mixed hyperlipidemia: Secondary | ICD-10-CM

## 2023-10-08 DIAGNOSIS — S51012A Laceration without foreign body of left elbow, initial encounter: Secondary | ICD-10-CM | POA: Diagnosis present

## 2023-10-08 DIAGNOSIS — R1312 Dysphagia, oropharyngeal phase: Secondary | ICD-10-CM | POA: Diagnosis not present

## 2023-10-08 DIAGNOSIS — I5082 Biventricular heart failure: Secondary | ICD-10-CM | POA: Diagnosis present

## 2023-10-08 DIAGNOSIS — N184 Chronic kidney disease, stage 4 (severe): Secondary | ICD-10-CM | POA: Diagnosis not present

## 2023-10-08 DIAGNOSIS — Z8249 Family history of ischemic heart disease and other diseases of the circulatory system: Secondary | ICD-10-CM

## 2023-10-08 DIAGNOSIS — F0393 Unspecified dementia, unspecified severity, with mood disturbance: Secondary | ICD-10-CM | POA: Diagnosis present

## 2023-10-08 DIAGNOSIS — S0083XA Contusion of other part of head, initial encounter: Secondary | ICD-10-CM | POA: Diagnosis present

## 2023-10-08 DIAGNOSIS — S72002A Fracture of unspecified part of neck of left femur, initial encounter for closed fracture: Secondary | ICD-10-CM | POA: Diagnosis not present

## 2023-10-08 DIAGNOSIS — N179 Acute kidney failure, unspecified: Secondary | ICD-10-CM | POA: Diagnosis not present

## 2023-10-08 DIAGNOSIS — Z8679 Personal history of other diseases of the circulatory system: Secondary | ICD-10-CM | POA: Diagnosis not present

## 2023-10-08 DIAGNOSIS — Y92091 Bathroom in other non-institutional residence as the place of occurrence of the external cause: Secondary | ICD-10-CM

## 2023-10-08 DIAGNOSIS — Z79899 Other long term (current) drug therapy: Secondary | ICD-10-CM

## 2023-10-08 DIAGNOSIS — Z881 Allergy status to other antibiotic agents status: Secondary | ICD-10-CM

## 2023-10-08 DIAGNOSIS — D72819 Decreased white blood cell count, unspecified: Secondary | ICD-10-CM | POA: Diagnosis present

## 2023-10-08 DIAGNOSIS — N401 Enlarged prostate with lower urinary tract symptoms: Secondary | ICD-10-CM | POA: Diagnosis present

## 2023-10-08 DIAGNOSIS — F32A Depression, unspecified: Secondary | ICD-10-CM | POA: Diagnosis present

## 2023-10-08 DIAGNOSIS — S0093XA Contusion of unspecified part of head, initial encounter: Secondary | ICD-10-CM

## 2023-10-08 DIAGNOSIS — D649 Anemia, unspecified: Secondary | ICD-10-CM | POA: Diagnosis not present

## 2023-10-08 DIAGNOSIS — K219 Gastro-esophageal reflux disease without esophagitis: Secondary | ICD-10-CM | POA: Diagnosis present

## 2023-10-08 DIAGNOSIS — I130 Hypertensive heart and chronic kidney disease with heart failure and stage 1 through stage 4 chronic kidney disease, or unspecified chronic kidney disease: Secondary | ICD-10-CM | POA: Diagnosis present

## 2023-10-08 DIAGNOSIS — S72142A Displaced intertrochanteric fracture of left femur, initial encounter for closed fracture: Principal | ICD-10-CM | POA: Diagnosis present

## 2023-10-08 DIAGNOSIS — Z515 Encounter for palliative care: Secondary | ICD-10-CM

## 2023-10-08 DIAGNOSIS — Z86008 Personal history of in-situ neoplasm of other site: Secondary | ICD-10-CM

## 2023-10-08 DIAGNOSIS — E8721 Acute metabolic acidosis: Secondary | ICD-10-CM | POA: Diagnosis present

## 2023-10-08 DIAGNOSIS — Z66 Do not resuscitate: Secondary | ICD-10-CM | POA: Diagnosis present

## 2023-10-08 DIAGNOSIS — Z87891 Personal history of nicotine dependence: Secondary | ICD-10-CM

## 2023-10-08 DIAGNOSIS — I2510 Atherosclerotic heart disease of native coronary artery without angina pectoris: Secondary | ICD-10-CM | POA: Diagnosis present

## 2023-10-08 DIAGNOSIS — R1310 Dysphagia, unspecified: Secondary | ICD-10-CM | POA: Diagnosis present

## 2023-10-08 LAB — COMPREHENSIVE METABOLIC PANEL WITH GFR
ALT: 25 U/L (ref 0–44)
AST: 17 U/L (ref 15–41)
Albumin: 3 g/dL — ABNORMAL LOW (ref 3.5–5.0)
Alkaline Phosphatase: 134 U/L — ABNORMAL HIGH (ref 38–126)
Anion gap: 13 (ref 5–15)
BUN: 89 mg/dL — ABNORMAL HIGH (ref 8–23)
CO2: 19 mmol/L — ABNORMAL LOW (ref 22–32)
Calcium: 7.8 mg/dL — ABNORMAL LOW (ref 8.9–10.3)
Chloride: 115 mmol/L — ABNORMAL HIGH (ref 98–111)
Creatinine, Ser: 4.46 mg/dL — ABNORMAL HIGH (ref 0.61–1.24)
GFR, Estimated: 12 mL/min — ABNORMAL LOW (ref >60)
Glucose, Bld: 118 mg/dL — ABNORMAL HIGH (ref 70–99)
Potassium: 4.2 mmol/L (ref 3.5–5.1)
Sodium: 147 mmol/L — ABNORMAL HIGH (ref 135–145)
Total Bilirubin: 0.8 mg/dL (ref 0.0–1.2)
Total Protein: 6.2 g/dL — ABNORMAL LOW (ref 6.5–8.1)

## 2023-10-08 LAB — I-STAT CHEM 8, ED
BUN: 79 mg/dL — ABNORMAL HIGH (ref 8–23)
Calcium, Ion: 1.06 mmol/L — ABNORMAL LOW (ref 1.15–1.40)
Chloride: 115 mmol/L — ABNORMAL HIGH (ref 98–111)
Creatinine, Ser: 4.5 mg/dL — ABNORMAL HIGH (ref 0.61–1.24)
Glucose, Bld: 112 mg/dL — ABNORMAL HIGH (ref 70–99)
HCT: 25 % — ABNORMAL LOW (ref 39.0–52.0)
Hemoglobin: 8.5 g/dL — ABNORMAL LOW (ref 13.0–17.0)
Potassium: 4.2 mmol/L (ref 3.5–5.1)
Sodium: 149 mmol/L — ABNORMAL HIGH (ref 135–145)
TCO2: 18 mmol/L — ABNORMAL LOW (ref 22–32)

## 2023-10-08 LAB — CBC WITH DIFFERENTIAL/PLATELET
Abs Immature Granulocytes: 0.01 K/uL (ref 0.00–0.07)
Basophils Absolute: 0 K/uL (ref 0.0–0.1)
Basophils Relative: 1 %
Eosinophils Absolute: 0.1 K/uL (ref 0.0–0.5)
Eosinophils Relative: 3 %
HCT: 25.4 % — ABNORMAL LOW (ref 39.0–52.0)
Hemoglobin: 7.8 g/dL — ABNORMAL LOW (ref 13.0–17.0)
Immature Granulocytes: 0 %
Lymphocytes Relative: 19 %
Lymphs Abs: 0.7 K/uL (ref 0.7–4.0)
MCH: 32.8 pg (ref 26.0–34.0)
MCHC: 30.7 g/dL (ref 30.0–36.0)
MCV: 106.7 fL — ABNORMAL HIGH (ref 80.0–100.0)
Monocytes Absolute: 0.5 K/uL (ref 0.1–1.0)
Monocytes Relative: 12 %
Neutro Abs: 2.5 K/uL (ref 1.7–7.7)
Neutrophils Relative %: 65 %
Platelets: 109 K/uL — ABNORMAL LOW (ref 150–400)
RBC: 2.38 MIL/uL — ABNORMAL LOW (ref 4.22–5.81)
RDW: 18.3 % — ABNORMAL HIGH (ref 11.5–15.5)
WBC: 3.9 K/uL — ABNORMAL LOW (ref 4.0–10.5)
nRBC: 0 % (ref 0.0–0.2)

## 2023-10-08 LAB — MAGNESIUM: Magnesium: 2.5 mg/dL — ABNORMAL HIGH (ref 1.7–2.4)

## 2023-10-08 LAB — PROTIME-INR
INR: 1.5 — ABNORMAL HIGH (ref 0.8–1.2)
Prothrombin Time: 18.7 s — ABNORMAL HIGH (ref 11.4–15.2)

## 2023-10-08 LAB — I-STAT CG4 LACTIC ACID, ED: Lactic Acid, Venous: 1.5 mmol/L (ref 0.5–1.9)

## 2023-10-08 MED ORDER — ONDANSETRON HCL 4 MG/2ML IJ SOLN: 4.0000 mg | Freq: Four times a day (QID) | INTRAMUSCULAR | Status: DC | PRN

## 2023-10-08 MED ORDER — ACETAMINOPHEN 325 MG PO TABS: 650.0000 mg | ORAL_TABLET | Freq: Four times a day (QID) | ORAL | Status: DC | PRN

## 2023-10-08 MED ORDER — FENTANYL CITRATE PF 50 MCG/ML IJ SOSY
50.0000 ug | PREFILLED_SYRINGE | Freq: Once | INTRAMUSCULAR | Status: AC
  Administered 2023-10-08 (×2): 50 ug via INTRAVENOUS
  Filled 2023-10-08: qty 1

## 2023-10-08 MED ORDER — MELATONIN 3 MG PO TABS: 3.0000 mg | ORAL_TABLET | Freq: Every evening | ORAL | Status: DC | PRN

## 2023-10-08 MED ORDER — MORPHINE SULFATE (PF) 2 MG/ML IV SOLN
2.0000 mg | INTRAVENOUS | Status: DC | PRN
  Administered 2023-10-08 (×2): 2 mg via INTRAVENOUS
  Filled 2023-10-08: qty 1

## 2023-10-08 MED ORDER — FENTANYL CITRATE PF 50 MCG/ML IJ SOSY
50.0000 ug | PREFILLED_SYRINGE | INTRAMUSCULAR | Status: DC | PRN
  Administered 2023-10-08 (×2): 50 ug via INTRAVENOUS
  Filled 2023-10-08 (×2): qty 1

## 2023-10-08 MED ORDER — BACITRACIN ZINC 500 UNIT/GM EX OINT
TOPICAL_OINTMENT | Freq: Two times a day (BID) | CUTANEOUS | Status: DC
  Administered 2023-10-08 (×2): 1 via TOPICAL
  Administered 2023-10-09: 31.5 via TOPICAL
  Administered 2023-10-09: 1 via TOPICAL
  Administered 2023-10-09: 31.5 via TOPICAL
  Administered 2023-10-09: 1 via TOPICAL
  Administered 2023-10-10 – 2023-10-11 (×6): 31.5 via TOPICAL
  Administered 2023-10-12: 1 via TOPICAL
  Administered 2023-10-12 – 2023-10-15 (×5): 31.5 via TOPICAL
  Filled 2023-10-08 (×3): qty 28.35
  Filled 2023-10-08: qty 0.9

## 2023-10-08 MED ORDER — ACETAMINOPHEN 650 MG RE SUPP: 650.0000 mg | Freq: Four times a day (QID) | RECTAL | Status: DC | PRN

## 2023-10-08 MED ORDER — LACTATED RINGERS IV SOLN: INTRAVENOUS | Status: AC

## 2023-10-08 NOTE — ED Provider Notes (Signed)
 [place] EMERGENCY DEPARTMENT AT [facility] Provider Note   CSN: [id number] Arrival date & time: 10/08/23  2022     Patient presents with: Fall on thinners   [name] is a 89 y.o. male.   HPI  Patient is a 89 year old male brought in from [facility] facility after a fall that occurred when he was attempting to ambulate to toilet.  Seems that he fell to the ground struck his head and was brought to the emergency room by EMS as a result of this.  Patient is not verbal at baseline per EMS account.  He is on Eliquis  for persistent A-fib.  Patient does have DNR.  He is on 2 L nasal cannula oxygen at baseline.  No medications were given and round.  History was limited due to patient's nonverbal condition.      Prior to Admission medications   Medication Sig Start Date End Date Taking? Authorizing Provider  amLODipine  (NORVASC ) 5 MG tablet TAKE 1 TABLET BY MOUTH ONCE DAILY 08/14/22  Yes Katrinka [name], MD  apixaban  (ELIQUIS ) 2.5 MG TABS tablet Take 1 tablet (2.5 mg total) by mouth 2 (two) times daily. 08/16/22  Yes Katrinka [name], MD  doxazosin  (CARDURA ) 4 MG tablet Take 1 tablet (4 mg total) by mouth daily. 08/14/22  Yes Katrinka [name], MD  escitalopram  (LEXAPRO ) 20 MG tablet Take 1 tablet (20 mg total) by mouth daily. 08/16/22  Yes Katrinka [name], MD  furosemide  (LASIX ) 40 MG tablet Take 1 tablet (40 mg total) by mouth 2 (two) times daily. 08/15/22  Yes Katrinka [name], MD  memantine  (NAMENDA ) 10 MG tablet Take 1 tablet (10 mg total) by mouth daily. 04/20/23  Yes Katrinka [name], MD  rosuvastatin  (CRESTOR ) 20 MG tablet TAKE 1 TABLET BY MOUTH ONCE DAILY Patient taking differently: Take 20 mg by mouth at bedtime. 08/14/22  Yes Katrinka [name], MD    Allergies: Ciprofloxacin hcl and Sulfamethoxazole-trimethoprim    Review of Systems  Updated Vital Signs BP (!) 118/40   Pulse (!) 56   Temp (!) 96.3 F (35.7 C) (Temporal)   Resp (!) 24   Ht  6' (1.829 m)   Wt 75.6 kg   SpO2 99%   BMI 22.60 kg/m   Physical Exam Vitals and nursing note reviewed.  Constitutional:      General: He is not in acute distress. HENT:     Head: Normocephalic and atraumatic.     Nose: Nose normal.  Eyes:     General: No scleral icterus. Cardiovascular:     Rate and Rhythm: Normal rate and regular rhythm.     Pulses: Normal pulses.     Heart sounds: Normal heart sounds.  Pulmonary:     Effort: Pulmonary effort is normal. No respiratory distress.     Breath sounds: No wheezing.  Abdominal:     Palpations: Abdomen is soft.     Tenderness: There is no abdominal tenderness.  Musculoskeletal:     Cervical back: Normal range of motion.     Comments: Left leg is shortened and externally rotated, DP PT pulses palpable.  There is bilateral symmetric foot edema no calf asymmetry or swelling.  Some discomfort with range of motion of left upper extremity at the shoulder and elbow, bilateral grip strength 5/5 normal for patient effort and age/condition  No step-off or deformity of C, T, L-spine.  Patient is in c-collar.  No deformity of either upper extremity,  no evidence of trauma to bilateral shins or feet.  Skin:    General: Skin is warm and dry.     Capillary Refill: Capillary refill takes less than 2 seconds.     Comments: Bruising to left eyebrow and left side of face and there is punctate laceration with some dried blood surrounding.  No active bleeding   Skin tear to left upper extremity approximately 6 cm x 4 cm with no active bleeding  Neurological:     Mental Status: He is alert. Mental status is at baseline.     Comments: Patient is at his neurologic baseline of nonverbal Is following simple commands.  Psychiatric:        Behavior: Behavior normal.     Comments: Unable to assess mood     (all labs ordered are listed, but only abnormal results are displayed) Labs Reviewed  CBC WITH DIFFERENTIAL/PLATELET - Abnormal; Notable for the  following components:      Result Value   WBC 3.9 (*)    RBC 2.38 (*)    Hemoglobin 7.8 (*)    HCT 25.4 (*)    MCV 106.7 (*)    RDW 18.3 (*)    Platelets 109 (*)    All other components within normal limits  COMPREHENSIVE METABOLIC PANEL WITH GFR - Abnormal; Notable for the following components:   Sodium 147 (*)    Chloride 115 (*)    CO2 19 (*)    Glucose, Bld 118 (*)    BUN 89 (*)    Creatinine, Ser 4.46 (*)    Calcium  7.8 (*)    Total Protein 6.2 (*)    Albumin 3.0 (*)    Alkaline Phosphatase 134 (*)    GFR, Estimated 12 (*)    All other components within normal limits  PROTIME-INR - Abnormal; Notable for the following components:   Prothrombin Time 18.7 (*)    INR 1.5 (*)    All other components within normal limits  MAGNESIUM - Abnormal; Notable for the following components:   Magnesium 2.5 (*)    All other components within normal limits  I-STAT CHEM 8, ED - Abnormal; Notable for the following components:   Sodium 149 (*)    Chloride 115 (*)    BUN 79 (*)    Creatinine, Ser 4.50 (*)    Glucose, Bld 112 (*)    Calcium , Ion 1.06 (*)    TCO2 18 (*)    Hemoglobin 8.5 (*)    HCT 25.0 (*)    All other components within normal limits  I-STAT CG4 LACTIC ACID, ED  TYPE AND SCREEN    EKG: None  Radiology: CT Head Wo Contrast Result Date: 10/08/2023 CLINICAL DATA:  Recent fall with headaches and neck pain, initial encounter EXAM: CT HEAD WITHOUT CONTRAST CT CERVICAL SPINE WITHOUT CONTRAST TECHNIQUE: Multidetector CT imaging of the head and cervical spine was performed following the standard protocol without intravenous contrast. Multiplanar CT image reconstructions of the cervical spine were also generated. RADIATION DOSE REDUCTION: This exam was performed according to the departmental dose-optimization program which includes automated exposure control, adjustment of the mA and/or kV according to patient size and/or use of iterative reconstruction technique. COMPARISON:   09/17/2021 FINDINGS: CT HEAD FINDINGS Brain: There are again noted changes consistent with prior left MCA infarct with considerable encephalomalacia. Atrophic changes and chronic white matter ischemic changes are seen as well. No acute hemorrhage, acute infarction or space-occupying mass lesion is noted. Vascular: No hyperdense  vessel or unexpected calcification. Skull: Normal. Negative for fracture or focal lesion. Sinuses/Orbits: No acute finding. Other: None. CT CERVICAL SPINE FINDINGS Alignment: Within normal limits. Skull base and vertebrae: 7 cervical segments are well visualized. Vertebral body height is well maintained. Prior fusion at C6-7 is noted. Multilevel facet hypertrophic changes and osteophytic changes are seen. No acute fracture or acute facet abnormality is noted. Soft tissues and spinal canal: Surrounding soft tissue structures are within normal limits. Upper chest: Visualized lung apices show bilateral pleural effusions which appear moderate to large worse on the right than the left. Other: None IMPRESSION: CT of the head: No acute intracranial abnormality noted. Changes of prior left MCA infarct. Chronic atrophic and ischemic changes. CT of the cervical spine: Multilevel degenerative change without acute abnormality. Electronically Signed   By: Oneil Devonshire M.D.   On: 10/08/2023 22:35   CT Cervical Spine Wo Contrast Result Date: 10/08/2023 CLINICAL DATA:  Recent fall with headaches and neck pain, initial encounter EXAM: CT HEAD WITHOUT CONTRAST CT CERVICAL SPINE WITHOUT CONTRAST TECHNIQUE: Multidetector CT imaging of the head and cervical spine was performed following the standard protocol without intravenous contrast. Multiplanar CT image reconstructions of the cervical spine were also generated. RADIATION DOSE REDUCTION: This exam was performed according to the departmental dose-optimization program which includes automated exposure control, adjustment of the mA and/or kV according to  patient size and/or use of iterative reconstruction technique. COMPARISON:  09/17/2021 FINDINGS: CT HEAD FINDINGS Brain: There are again noted changes consistent with prior left MCA infarct with considerable encephalomalacia. Atrophic changes and chronic white matter ischemic changes are seen as well. No acute hemorrhage, acute infarction or space-occupying mass lesion is noted. Vascular: No hyperdense vessel or unexpected calcification. Skull: Normal. Negative for fracture or focal lesion. Sinuses/Orbits: No acute finding. Other: None. CT CERVICAL SPINE FINDINGS Alignment: Within normal limits. Skull base and vertebrae: 7 cervical segments are well visualized. Vertebral body height is well maintained. Prior fusion at C6-7 is noted. Multilevel facet hypertrophic changes and osteophytic changes are seen. No acute fracture or acute facet abnormality is noted. Soft tissues and spinal canal: Surrounding soft tissue structures are within normal limits. Upper chest: Visualized lung apices show bilateral pleural effusions which appear moderate to large worse on the right than the left. Other: None IMPRESSION: CT of the head: No acute intracranial abnormality noted. Changes of prior left MCA infarct. Chronic atrophic and ischemic changes. CT of the cervical spine: Multilevel degenerative change without acute abnormality. Electronically Signed   By: Oneil Devonshire M.D.   On: 10/08/2023 22:35   DG Hip Unilat W or Wo Pelvis 2-3 Views Left Result Date: 10/08/2023 CLINICAL DATA:  trauma EXAM: DG HIP (WITH OR WITHOUT PELVIS) 2-3V LEFT COMPARISON:  September 17, 2021 FINDINGS: Osteopenia.Obliquely oriented, comminuted, and displaced subtrochanteric fracture of the proximal femur.The femoral shaft is posteromedially displaced at least 1 shaft width. The fracture line extends into the lesser trochanter, which is medially displaced 1.6 cm.No dislocation of the hip. Lumbar degenerative disc disease. Extensive aortoiliac and peripheral  vascular atherosclerosis. IMPRESSION: Obliquely oriented, displaced subtrochanteric fracture of the proximal femur with involvement of the lesser trochanter, as further described above. If clinically warranted, a dedicated CT of the left hip may be of benefit for preoperative planning. Electronically Signed   By: Rogelia Myers M.D.   On: 10/08/2023 21:03   DG Chest Port 1 View Result Date: 10/08/2023 CLINICAL DATA:  trauma EXAM: PORTABLE CHEST - 1 VIEW COMPARISON:  June 25, 2023 FINDINGS: Bilateral perihilar interstitial opacities throughout both lungs. Moderate volume bilateral pleural effusions. No pneumothorax. Mild cardiomegaly. Sternotomy wires and CABG changes. Tortuous aorta with aortic atherosclerosis. No acute, displaced fracture or destructive lesions. Multilevel thoracic osteophytosis. IMPRESSION: Mild cardiomegaly findings of interstitial edema throughout both lungs. Moderate volume bilateral pleural effusions. Electronically Signed   By: Rogelia Myers M.D.   On: 10/08/2023 20:58   DG Elbow Complete Left Result Date: 10/08/2023 CLINICAL DATA:  trauma EXAM: LEFT ELBOW - COMPLETE 3+ VIEW COMPARISON:  None Available. FINDINGS: Osteopenia.No acute, displaced fracture or dislocation. Superior elevation of the anterior fat pad. There is no evidence of arthropathy or other focal bone abnormality. Soft tissues are unremarkable. IMPRESSION: Elevated anterior fat pad suggests the presence of an elbow joint effusion. While no acute, displaced fracture is visualized, in the setting of trauma, elevation of the anterior fat pad is worrisome for underlying effusion and occult elbow fracture. Electronically Signed   By: Rogelia Myers M.D.   On: 10/08/2023 20:56   DG Shoulder Left Result Date: 10/08/2023 CLINICAL DATA:  fall, injury EXAM: LEFT SHOULDER - 2+ VIEW COMPARISON:  None Available. FINDINGS: Osteopenia.No acute fracture or dislocation. There is no evidence of arthropathy or other focal bone  abnormality. Soft tissues are unremarkable. IMPRESSION: No acute fracture or dislocation. Electronically Signed   By: Rogelia Myers M.D.   On: 10/08/2023 20:54     .Critical Care  Performed by: Neldon Hamp RAMAN, PA Authorized by: Neldon Hamp RAMAN, PA   Critical care provider statement:    Critical care time (minutes):  35   Critical care time was exclusive of:  Separately billable procedures and treating other patients and teaching time   Critical care was necessary to treat or prevent imminent or life-threatening deterioration of the following conditions:  Trauma and metabolic crisis   Critical care was time spent personally by me on the following activities:  Development of treatment plan with patient or surrogate, review of old charts, re-evaluation of patient's condition, pulse oximetry, ordering and review of radiographic studies, ordering and review of laboratory studies, ordering and performing treatments and interventions, obtaining history from patient or surrogate, examination of patient and evaluation of patient's response to treatment   Care discussed with: admitting provider   Ultrasound ED FAST  Date/Time: 10/08/2023 10:42 PM  Performed by: Neldon Hamp RAMAN, PA Authorized by: Neldon Hamp RAMAN, PA  Procedure details:     Indications comment:  Level 2 trauma    Assess for:  Pneumothorax and intra-abdominal fluid     Images: archived    Study Limitations: body habitus and bowel gas  Comments:     Negative all 4 quadrants  - noticeable pleural effusions BL   Medications Ordered in the ED  bacitracin  ointment (1 Application Topical Given 10/08/23 2134)  fentaNYL  (SUBLIMAZE ) injection 50 mcg (50 mcg Intravenous Given 10/08/23 2201)  fentaNYL  (SUBLIMAZE ) injection 50 mcg (50 mcg Intravenous Given 10/08/23 2055)    Clinical Course as of 10/08/23 2244  Mon Oct 08, 2023  2141 DG Elbow Complete Left ? Occult elbow fx [WF]  2141 DG Hip Unilat W or Wo Pelvis 2-3 Views  Left Obliquely oriented, displaced subtrochanteric fracture of the proximal femur with involvement of the lesser trochanter, as further described above   [WF]  2210 Discussed the with Dr. Elsa of the orthopedic service who hopes to be able to do surgery tomorrow.  Will admit to medicine [BM]  2240 Dr. Elsa ortho [WF]  Clinical Course User Index [BM] Cleotilde Rogue, MD [WF] Neldon Hamp RAMAN, [place]                                 Medical Decision Making Amount and/or Complexity of Data Reviewed Labs: ordered. Radiology: ordered. Decision-making details documented in ED Course.  Risk OTC drugs. Prescription drug management. Decision regarding hospitalization.   This patient presents to the ED for concern of trauma/fall, this involves a number of treatment options, and is a complaint that carries with it a moderate to high risk of complications and morbidity. A differential diagnosis was considered for the patient's symptoms which is discussed below:      Co morbidities: Discussed in HPI   Brief History:  Patient is a 89 year old male brought in from [facility] facility after a fall that occurred when he was attempting to ambulate to toilet.  Seems that he fell to the ground struck his head and was brought to the emergency room by EMS as a result of this.  Patient is not verbal at baseline per EMS account.  He is on Eliquis  for persistent A-fib.  Patient does have DNR.  He is on 2 L nasal cannula oxygen at baseline.  No medications were given and round.  History was limited due to patient's nonverbal condition.     EMR reviewed including pt PMHx, past surgical history and past visits to ER.   See HPI for more details   Lab Tests:  CMP with mild hyponatremia, AKI on CKD, magnesium mildly elevated 2.5  CBC with anemia 7.8, INR mildly elevated at 1.5  Imaging Studies:  Abnormal findings. I personally reviewed all imaging studies. Imaging notable for  CT  head with no acute hemorrhage.  Findings consistent with old prior stroke likely left middle cerebral artery.  CT C-spine unremarkable  Left hip fracture subtrochanteric fracture Posterior fat pad notable on the left elbow x-ray.  Placed in splint.  Cardiac Monitoring:      Medicines ordered:  I ordered medication including fentanyl  for pain, bacitracin  ointment for skin tear Reevaluation of the patient after these medicines showed that the patient improved I have reviewed the patients home medicines and have made adjustments as needed   Critical Interventions:   admission for surgery, pain control, hyponatremia, AKI   Consults/Attending Physician  Dr. Cleotilde had consultation discussion with both Dr. Elsa and Dr. Marcene   Requested consultation with Dr. Elsa who states surgery likely tomorrow,  and discussed lab and imaging findings as well as pertinent plan - they recommend: admission and will see tomorrow  Dr. Marcene consulted for admission.   Reevaluation:  After the interventions noted above I re-evaluated patient and found that they have :stayed the same   Social Determinants of Health:      Problem List / ED Course:  Fall/traumatic subtrochanteric left hip fracture. Pain medication ordered Ortho consulted  Superficial hematoma to left side of scalp AKI on CKD Hypernatremia  Hyperchloremia   Patient with multiple medical problems admitted to hospitalist service orthopedic aware of patient for hip fracture.  Patient's daughter at bedside updated on plan for likely surgery tomorrow.  She understands that this may not change patient's overall trajectory with decline.  He is DNR  Dispostion:  After consideration of the diagnostic results and the patients response to treatment, I feel that the patent would benefit from admission    Final diagnoses:  Closed fracture of left hip, initial encounter [facility])  Traumatic hematoma of head, initial encounter   Acute renal failure superimposed on chronic kidney disease, unspecified acute renal failure type, unspecified CKD stage [facility])  Hypernatremia  Hyperchloremia    ED Discharge Orders     None          Neldon Hamp RAMAN, [place] 10/08/23 2350    Cleotilde Rogue, MD 10/09/23 1249

## 2023-10-08 NOTE — Progress Notes (Signed)
 Orthopedic Tech Progress Note Patient Details:  [name] 1928-06-12 [id number]  [name], male   DOB: 1928-10-15, 89 y.o.   MRN: [id number] I attended trauma page. Chandra Dorn PARAS 10/08/2023, 8:57 PM

## 2023-10-08 NOTE — Consult Note (Signed)
 Brief orthopedic consult note:  Called by EDP for patient status post fall with left displaced subtrochanteric femur fracture.  Patient is on Eliquis  at baseline and hemoglobin in the ED is 7.8.  Patient is ambulatory at baseline and therefore indicated for open treatment of his fracture likely with cephalomedullary nail fixation.  Keep n.p.o. past midnight for possible OR tomorrow.  Will await clearance from the hospitalist team and defer any blood transfusion to them.  Bedrest for now.

## 2023-10-08 NOTE — Progress Notes (Signed)
 Orthopedic Tech Progress Note Patient Details:  TABITHA TUPPER Aug 22, 1928 [id number]  Ortho Devices Type of Ortho Device: Arm sling, Post (long arm) splint Ortho Device/Splint Location: lue Ortho Device/Splint Interventions: Ordered, Application, Adjustment   Post Interventions Instructions Provided: Care of device, Adjustment of device  Chandra Dorn PARAS 10/08/2023, 10:44 PM

## 2023-10-08 NOTE — Progress Notes (Signed)
   10/08/23 2030  Spiritual Encounters  Type of Visit Initial  Care provided to: Family  Referral source Trauma page  Reason for visit Trauma  OnCall Visit No   Chaplain responded to a level two trauma. I provided support to the daughter of the patient, escorting [name] to the patient's room. I provided support as needed, and assured her if she wished to see a chaplain later in their stay to reach out to their nurse and someone would respond.   Carley Birmingham  [facility] [phone]

## 2023-10-08 NOTE — H&P (Signed)
 History and Physical      [name] FMW:[id number] DOB: 01-08-1929 DOA: 10/08/2023; DOS: 10/08/2023  PCP: Katrinka [name], MD  Patient coming from: SNF   I have personally briefly reviewed patient's old medical records in [facility]  Chief Complaint: Fall  HPI: [name] is a 89 y.o. male with medical history significant for ischemic stroke with residual expressive aphasia, nonverbal at baseline, residual right hemiparesis, persistent atrial fibrillation chronically anticoagulated on Eliquis , anemia of chronic disease baseline hemoglobin 9-10, CKD stage IV with baseline creatinine 3.2-3.4, essential hypertension, chronic right-sided systolic heart failure, who is admitted to [facility] on 10/08/2023 with acute left hip fracture after presenting from snf to [facility] ED for evaluation of fall.   As the patient is nonverbal at baseline, following history is provided by SNF staff in addition to my discussions with the EDP and via chart review.  The patient reportedly experienced a fall while attempting to ambulate between the dining room and the bathroom at his SNF.  This was a witnessed fall, will to SNF staff able to confirm no evidence of loss of consciousness.  The patient was noted to fall to his left, his face/forehead on the floor as a result of this fall.  He is chronically anticoagulated on Eliquis  in the setting of persistent atrial fibrillation.  Otherwise, does not appear to be on any additional blood thinners as an outpatient.  His medical history is notable for chronic right-sided systolic heart failure, with most recent echocardiogram on 06/26/2023 notable for LVEF 70 to 75%, no focal motion abnormalities, mildly reduced right ventricular systolic function is severely enlarged right ventricular cavity size, severely dilated bilateral atria, mild mitral gravitation, moderate tricuspid regurgitation and trivial aortic regurgitation.  Lasix  40 mg p.o. twice daily as an  outpatient.    ED Course:  Vital signs in the ED were notable for the following: Temperature 96.3 F, heart rates in the 50s to 60s; systolic pressures in 1 teens 879d; respiratory rate 2124, oxygen saturation 98 to 100% on room air.  Labs were notable for the following: CMP was notable for the following: Sodium 147 compared to 139 on 06/27/2023, potassium 4.2, creatinine 4.46 compared to 3.34 on 06/27/2023, glucose 118, calcium  adjusted for hypoalbuminemia noted to be 8.6, avidin 3.0.  Otherwise, liver enzymes within normal limits.  CBC notable for will blood cell count 3900 with 65% neutrophils compared to open cell count of 7300 on 06/26/2023, hemoglobin 7.8 associated macrocytic finding and relative demonstration prior hemoglobin data point of 9.9 on 06/26/2023, platelet count 109 compared to 128 on 06/17/2023.  Lactic acid 1.5.  Type and screen ordered.  Per my interpretation, EKG in ED demonstrated the following: No EKG performed in the ED today.  Imaging in the ED, per corresponding formal radiology read, was notable for the following: 1 view chest x-ray, in comparison to x-ray from 06/25/2023 shows interval improvement of previously noted pulmonary edema, now with interstitial edema, moderate bilateral pleural effusions, in the absence of evidence of infiltrate or pneumothorax.  Plain films of the left hip show displaced subtrochanteric fracture of the proximal femur.  Plain films of the left ankle showed no overt evidence of acute fracture.  Symptoms of the left shoulder showed no evidence of acute fracture or dislocation.  Noncontrast CT head showed no evidence of acute intracranial process, Cleen evidence of intracranial injury evidence of acute infarct.  CT cervical spine showed no sign of acute cervical spine fracture subluxation  injury.  EDP d/w on-call orthopedic surgeon, Dr. [facility], who will formally consult and is planning to take patient to the OR in the morning  (8/12).  While in the ED, the following were administered: Fentanyl  50 mcg IV x 2 doses, morphine  2 mg IV x 1.   Subsequently, the patient was admitted for further evaluation management of presenting acute left hip fracture after apparent ground-level mechanical fall at SNF, with presenting labs notable for acute kidney injury superimposed on CKD stage IV, acute on chronic anemia, acute hyponatremia.     Review of Systems: As per HPI otherwise 10 point review of systems negative.   Past Medical History:  Diagnosis Date   Actinic keratosis    BPH with urinary obstruction    Bradycardia    CAD (coronary artery disease)    CABG x5   Cerebrovascular accident (stroke) (HCC)    With right hemiparesis and persistent expressive aphasia   GERD (gastroesophageal reflux disease)    History of hydronephrosis    Hx of hydronephrosis 07/08/2011   right    Hyperlipidemia    Hypertension    Iron deficiency anemia due to chronic blood loss 03/26/2007   Now resolved     Iron deficiency anemia secondary to blood loss (chronic)    Persistent atrial fibrillation (HCC)    Pulmonary hypertension (HCC)    by echo 2013   Rosacea    Situational depression    Severe   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 He has never used smokeless tobacco. He reports current alcohol  use of about 1.0 standard drink of alcohol  per week. He reports that he does not use drugs.   Allergies  Allergen Reactions    Ciprofloxacin Hcl Other (See Comments)    Reaction unknown by family   Sulfamethoxazole-Trimethoprim Other (See Comments)    Reaction unknown by family    Family History  Problem Relation Age of Onset   Heart disease Father    Heart attack Other        fhx   Coronary artery disease Other        fhx    Family history reviewed and not pertinent    Prior to Admission medications   Medication Sig Start Date End Date Taking? Authorizing Provider  amLODipine  (NORVASC ) 5 MG tablet TAKE 1 TABLET BY MOUTH ONCE DAILY 08/14/22  Yes Katrinka [name], MD  apixaban  (ELIQUIS ) 2.5 MG TABS tablet Take 1 tablet (2.5 mg total) by mouth 2 (two) times daily. 08/16/22  Yes Katrinka [name], MD  doxazosin  (CARDURA ) 4 MG tablet Take 1 tablet (4 mg total) by mouth daily. 08/14/22  Yes Katrinka [name], MD  escitalopram  (LEXAPRO ) 20 MG tablet Take 1 tablet (20 mg total) by mouth daily. 08/16/22  Yes Katrinka [name],  MD  furosemide  (LASIX ) 40 MG tablet Take 1 tablet (40 mg total) by mouth 2 (two) times daily. 08/15/22  Yes Katrinka [name], MD  memantine  (NAMENDA ) 10 MG tablet Take 1 tablet (10 mg total) by mouth daily. 04/20/23  Yes Katrinka [name], MD  OXYGEN Inhale 2 L into the lungs continuous.   Yes [provider]  rosuvastatin  (CRESTOR ) 20 MG tablet TAKE 1 TABLET BY MOUTH ONCE DAILY Patient taking differently: Take 20 mg by mouth at bedtime. 08/14/22  Yes Katrinka [name], MD     Objective    Physical Exam: Vitals:   10/08/23 2030 10/08/23 2045 10/08/23 2122  BP: (!) 118/40    Pulse: (!) 51 (!) 56   Resp:  (!) 24   Temp:  (!) 96.3 F (35.7 C)   TempSrc:  Temporal   SpO2: 98% 99%   Weight:   75.6 kg  Height:   6' (1.829 m)    General: appears to be stated age; somnolent, non-verbal Skin: warm, dry, no rash Head:  abrasion over left eye Mouth:  Oral mucosa membranes appear dry, normal dentition Neck: supple; trachea midline Heart: Irregular; 2 out of 6 holosystolic murmur  noted Lungs: CTAB, did not appreciate any wheezes, rales, or rhonchi Abdomen: + BS; soft, ND, NT Vascular: 2+ pedal pulses b/l; 2+ radial pulses b/l Extremities: Left lower extremity appears externally rotated and shorter than right lower extremity, no muscle wasting    Labs on Admission: I have personally reviewed following labs and imaging studies  CBC: Recent Labs  Lab 10/08/23 2036 10/08/23 2037  WBC 3.9*  --   NEUTROABS 2.5  --   HGB 7.8* 8.5*  HCT 25.4* 25.0*  MCV 106.7*  --   PLT 109*  --    Basic Metabolic Panel: Recent Labs  Lab 10/08/23 2036 10/08/23 2037  NA 147* 149*  K 4.2 4.2  CL 115* 115*  CO2 19*  --   GLUCOSE 118* 112*  BUN 89* 79*  CREATININE 4.46* 4.50*  CALCIUM  7.8*  --   MG 2.5*  --    GFR: Estimated Creatinine Clearance: 11.2 mL/min (A) (by C-G formula based on SCr of 4.5 mg/dL (H)). Liver Function Tests: Recent Labs  Lab 10/08/23 2036  AST 17  ALT 25  ALKPHOS 134*  BILITOT 0.8  PROT 6.2*  ALBUMIN 3.0*   No results for input(s): LIPASE, AMYLASE in the last 168 hours. No results for input(s): AMMONIA in the last 168 hours. Coagulation Profile: Recent Labs  Lab 10/08/23 2036  INR 1.5*   Cardiac Enzymes: No results for input(s): CKTOTAL, CKMB, CKMBINDEX, TROPONINI in the last 168 hours. BNP (last 3 results) No results for input(s): PROBNP in the last 8760 hours. HbA1C: No results for input(s): HGBA1C in the last 72 hours. CBG: No results for input(s): GLUCAP in the last 168 hours. Lipid Profile: No results for input(s): CHOL, HDL, LDLCALC, TRIG, CHOLHDL, LDLDIRECT in the last 72 hours. Thyroid  Function Tests: No results for input(s): TSH, T4TOTAL, FREET4, T3FREE, THYROIDAB in the last 72 hours. Anemia Panel: No results for input(s): VITAMINB12, FOLATE, FERRITIN, TIBC, IRON, RETICCTPCT in the last 72 hours. Urine analysis:    Component Value Date/Time   COLORURINE STRAW  (A) 06/25/2023 1412   APPEARANCEUR CLEAR 06/25/2023 1412   LABSPEC 1.010 06/25/2023 1412   PHURINE 5.0 06/25/2023 1412   GLUCOSEU NEGATIVE 06/25/2023 1412   GLUCOSEU NEGATIVE 10/01/2017 1409   HGBUR NEGATIVE 06/25/2023 1412   BILIRUBINUR  NEGATIVE 06/25/2023 1412   BILIRUBINUR neg 05/10/2021 1710   KETONESUR NEGATIVE 06/25/2023 1412   PROTEINUR 100 (A) 06/25/2023 1412   UROBILINOGEN 0.2 05/10/2021 1710   UROBILINOGEN 0.2 10/01/2017 1409   NITRITE NEGATIVE 06/25/2023 1412   LEUKOCYTESUR NEGATIVE 06/25/2023 1412    Radiological Exams on Admission: CT Head Wo Contrast Result Date: 10/08/2023 CLINICAL DATA:  Recent fall with headaches and neck pain, initial encounter EXAM: CT HEAD WITHOUT CONTRAST CT CERVICAL SPINE WITHOUT CONTRAST TECHNIQUE: Multidetector CT imaging of the head and cervical spine was performed following the standard protocol without intravenous contrast. Multiplanar CT image reconstructions of the cervical spine were also generated. RADIATION DOSE REDUCTION: This exam was performed according to the departmental dose-optimization program which includes automated exposure control, adjustment of the mA and/or kV according to patient size and/or use of iterative reconstruction technique. COMPARISON:  09/17/2021 FINDINGS: CT HEAD FINDINGS Brain: There are again noted changes consistent with prior left MCA infarct with considerable encephalomalacia. Atrophic changes and chronic white matter ischemic changes are seen as well. No acute hemorrhage, acute infarction or space-occupying mass lesion is noted. Vascular: No hyperdense vessel or unexpected calcification. Skull: Normal. Negative for fracture or focal lesion. Sinuses/Orbits: No acute finding. Other: None. CT CERVICAL SPINE FINDINGS Alignment: Within normal limits. Skull base and vertebrae: 7 cervical segments are well visualized. Vertebral body height is well maintained. Prior fusion at C6-7 is noted. Multilevel facet hypertrophic  changes and osteophytic changes are seen. No acute fracture or acute facet abnormality is noted. Soft tissues and spinal canal: Surrounding soft tissue structures are within normal limits. Upper chest: Visualized lung apices show bilateral pleural effusions which appear moderate to large worse on the right than the left. Other: None IMPRESSION: CT of the head: No acute intracranial abnormality noted. Changes of prior left MCA infarct. Chronic atrophic and ischemic changes. CT of the cervical spine: Multilevel degenerative change without acute abnormality. Electronically Signed   By: Oneil Devonshire M.D.   On: 10/08/2023 22:35   CT Cervical Spine Wo Contrast Result Date: 10/08/2023 CLINICAL DATA:  Recent fall with headaches and neck pain, initial encounter EXAM: CT HEAD WITHOUT CONTRAST CT CERVICAL SPINE WITHOUT CONTRAST TECHNIQUE: Multidetector CT imaging of the head and cervical spine was performed following the standard protocol without intravenous contrast. Multiplanar CT image reconstructions of the cervical spine were also generated. RADIATION DOSE REDUCTION: This exam was performed according to the departmental dose-optimization program which includes automated exposure control, adjustment of the mA and/or kV according to patient size and/or use of iterative reconstruction technique. COMPARISON:  09/17/2021 FINDINGS: CT HEAD FINDINGS Brain: There are again noted changes consistent with prior left MCA infarct with considerable encephalomalacia. Atrophic changes and chronic white matter ischemic changes are seen as well. No acute hemorrhage, acute infarction or space-occupying mass lesion is noted. Vascular: No hyperdense vessel or unexpected calcification. Skull: Normal. Negative for fracture or focal lesion. Sinuses/Orbits: No acute finding. Other: None. CT CERVICAL SPINE FINDINGS Alignment: Within normal limits. Skull base and vertebrae: 7 cervical segments are well visualized. Vertebral body height is well  maintained. Prior fusion at C6-7 is noted. Multilevel facet hypertrophic changes and osteophytic changes are seen. No acute fracture or acute facet abnormality is noted. Soft tissues and spinal canal: Surrounding soft tissue structures are within normal limits. Upper chest: Visualized lung apices show bilateral pleural effusions which appear moderate to large worse on the right than the left. Other: None IMPRESSION: CT of the head: No acute intracranial abnormality noted.  Changes of prior left MCA infarct. Chronic atrophic and ischemic changes. CT of the cervical spine: Multilevel degenerative change without acute abnormality. Electronically Signed   By: Oneil Devonshire M.D.   On: 10/08/2023 22:35   DG Hip Unilat W or Wo Pelvis 2-3 Views Left Result Date: 10/08/2023 CLINICAL DATA:  trauma EXAM: DG HIP (WITH OR WITHOUT PELVIS) 2-3V LEFT COMPARISON:  September 17, 2021 FINDINGS: Osteopenia.Obliquely oriented, comminuted, and displaced subtrochanteric fracture of the proximal femur.The femoral shaft is posteromedially displaced at least 1 shaft width. The fracture line extends into the lesser trochanter, which is medially displaced 1.6 cm.No dislocation of the hip. Lumbar degenerative disc disease. Extensive aortoiliac and peripheral vascular atherosclerosis. IMPRESSION: Obliquely oriented, displaced subtrochanteric fracture of the proximal femur with involvement of the lesser trochanter, as further described above. If clinically warranted, a dedicated CT of the left hip may be of benefit for preoperative planning. Electronically Signed   By: Rogelia Myers M.D.   On: 10/08/2023 21:03   DG Chest Port 1 View Result Date: 10/08/2023 CLINICAL DATA:  trauma EXAM: PORTABLE CHEST - 1 VIEW COMPARISON:  June 25, 2023 FINDINGS: Bilateral perihilar interstitial opacities throughout both lungs. Moderate volume bilateral pleural effusions. No pneumothorax. Mild cardiomegaly. Sternotomy wires and CABG changes. Tortuous aorta with  aortic atherosclerosis. No acute, displaced fracture or destructive lesions. Multilevel thoracic osteophytosis. IMPRESSION: Mild cardiomegaly findings of interstitial edema throughout both lungs. Moderate volume bilateral pleural effusions. Electronically Signed   By: Rogelia Myers M.D.   On: 10/08/2023 20:58   DG Elbow Complete Left Result Date: 10/08/2023 CLINICAL DATA:  trauma EXAM: LEFT ELBOW - COMPLETE 3+ VIEW COMPARISON:  None Available. FINDINGS: Osteopenia.No acute, displaced fracture or dislocation. Superior elevation of the anterior fat pad. There is no evidence of arthropathy or other focal bone abnormality. Soft tissues are unremarkable. IMPRESSION: Elevated anterior fat pad suggests the presence of an elbow joint effusion. While no acute, displaced fracture is visualized, in the setting of trauma, elevation of the anterior fat pad is worrisome for underlying effusion and occult elbow fracture. Electronically Signed   By: Rogelia Myers M.D.   On: 10/08/2023 20:56   DG Shoulder Left Result Date: 10/08/2023 CLINICAL DATA:  fall, injury EXAM: LEFT SHOULDER - 2+ VIEW COMPARISON:  None Available. FINDINGS: Osteopenia.No acute fracture or dislocation. There is no evidence of arthropathy or other focal bone abnormality. Soft tissues are unremarkable. IMPRESSION: No acute fracture or dislocation. Electronically Signed   By: Rogelia Myers M.D.   On: 10/08/2023 20:54      Assessment/Plan   Principal Problem:   Closed left hip fracture (HCC) Active Problems:   HLD (hyperlipidemia)   History of essential hypertension   Depression   Acute renal failure superimposed on stage 4 chronic kidney disease (HCC)   Fall at home, initial encounter   Acute on chronic anemia   Acute hypernatremia   SIRS (systemic inflammatory response syndrome) (HCC)   Persistent atrial fibrillation (HCC)     #) Acute displaced subtrochanteric fracture of proximal left femur: confirmed via presenting plain  films and stemming from ground level mechanical fall without associated loss of consciousness that occurred earlier on the day of admission at SNF, as further described above.  Patient noted to be on Eliquis  as an outpatient in the setting of persistent atrial fibrillation.   EDP d/w on-call orthopedic surgeon, Dr. [facility], who will formally consult and is planning to take patient to the OR in the morning (8/12).  It appears that patient is scheduledthe for surgery at 735 AM.  Chest x-ray shows no focal resolution in the pulmonary edema noted on chest x-ray in April 2025, now with interstitial edema, suggesting improvement in volume status, with persistent pleural effusions.  Preoperative EKG pending.  Overall, in the absence of overt evidence to suggest acutely decompensated heart failure or acute MI, failure no absolute contraindications to proceeding with proposed orthopedic surgery at this time.  Gupta Score for this patient in the context of anticipated aforementioned orthopedic surgery conveys a 12.6% perioperative risk for significant cardiac event.   Plan: Formal orthopedic surgery consult for definitive surgical management, with plan to take patient to the OR tomorrow (8/12). NPO after MN in anticipation of this procedure.  No pharmacologic anticoagulation leading up to this anticipated surgery.  Hold outpatient Eliquis .  SCD's. Prn IV fentanyl . Anticipate postoperative PT consult. Pre-op EKG has been ordered.  Type and screen ordered.                 #) Ground level mechanical fall: As reported by SNF staff, without any loss of consciousness .  Patient did hit his head as a component of this fall, which is notable in the context of being chronically anticoagulated on Eliquis .  However, CT head shows no evidence of acute intracranial process, including no evidence of intra hemorrhage We will CT 0 spine shows no evidence of acute cervical spine fracture or subluxation  injury.  Aside from his presenting acute left hip fracture, traumatic workup performed in the ED showed no evidence of additional acute traumatic fracture.  In terms of predisposing factors it may have increased likelihood of the patient, the mechanical fall, there is no overt evidence of infectious process at this time, including chest x-ray which showed no evidence of infiltrate.  Will also check urinalysis.   Plan: Check urinalysis, as above.  CMP and CBC with differential in the morning. Fall precautions. Anticipate postoperative PT consult.                       #) Acute Kidney Injury superimposed on CKD stage IV:   In the context of his history of CKD stage IV with baseline creatinine 3.2-3.4, most recent prior creatinine noted to be 3.34 on 06/27/2023, he is currently receiving demonstrates interval elevation today at 4.46.  Volume status for this patient complicated by his history of chronic right-sided systolic heart failure.  Clinically, he appears slightly intravascularly dry.  Will pursue further diagnostic evaluation of the patient's acute kidney injury with some very gentle IV fluids and close monitoring of ensuing renal function trend as outlined below.   Plan: monitor strict I's & O's and daily weights. Attempt to avoid nephrotoxic agents. Refrain from NSAIDs. Repeat CMP in the morning. Check serum magnesium level.  Check urinalysis with microscopy.  Add-on random urine sodium and random urine creatinine.  Check CPK level. LR 75/hr x 8 hrs.                     #) Acute on chronic anemia: In the setting of history of anemia of chronic disease with baseline hemoglobin range of 9-10, presenting hemoglobin is slightly lower at 7.8.  No overt evidence of acute contributory blood loss at this time.  Potential contribution from interval decline in renal function, as outlined above.  His anemia is noted to be macrocytic in nature, with remainder of CBC,  potentially suggestive of a myelodysplastic  pathology.  He has no known history of underlying liver disease, and liver enzymes were found to be nonelevated this evening.  Will also check B12 and folic acid levels.  No indication for PRBC transfusion at this time.  Appears medically stable.  Type and screen ordered.  INR this evening is 1.5 in the setting of chronic anticoagulation on Eliquis .  Plan: Type and screen.  Add on iron studies, B12 level, folic acid level.  CBC in the morning.                   #) Acute hypernatremia: Presenting sodium 147 compared to most recent prior value of 139 on 06/27/2023.  As noted above, evaluation of his volume status is complicated, although some suspicion for a degree of intravascular depletion, including potential inadequate free water  intake.  In the setting of his presenting fall with head trauma, differential would also include central diabetes insipidus.  Not on lithium as an outpatient.  Will pursue additional diagnostic evaluation of patient's acute hyponatremia, as below, will providing very gentle IV fluids overnight with LR, and close monitoring of in setting trend and serum sodium as well as close monitoring of his hearing volume status, as outlined below.   Plan: Check urinalysis, random urine sodium, random urine creatinine, urine osmolality, serum osmolality.  Monitor strict I's and O's and daily weights.  Repeat CMP in the morning.   Lactated Ringer's at 75 cc x 8 hours.  Check BNP.                        #) SIRS criteria present: Presentation associated with leukopenia, hypothermia, tachypnea.  However, in the absence of e/o underlying infection at this time, including, chest x-ray which shows no evidence of infiltrate, criteria for sepsis not currently met.  patient appears hemodynamically stable at this time.  Consequently, will refrain from initiation of IV antibiotics at this time.   Plan: Repeat CBC with diff  in the AM.  Monitor strict I's and O's and daily weights.  Monitor on telemetry. Refraining from IV abx for now, as above.  Check urinalysis.                       #) Persistent atrial fibrillation: Documented history of such. In setting of CHA2DS2-VASc score of  6, there is an indication for chronic anticoagulation for thromboembolic prophylaxis. Consistent with this, patient is chronically anticoagulated on Eliquis , which will be held for now in the context of presenting acute left hip fracture with plan for definitive surgical management thereafter in the morning. Home AV nodal blocking regimen: None.  Most recent echocardiogram was performed in April 2025, with results that include severely dilated bilateral atria, predisposing him to the persistent/chronic nature of his atrial fibrillation, with additional results as conveyed above.  Appears to be in rate controlled atrial fibrillation this evening.  Plan: monitor strict I's & O's and daily weights. CMP/CBC in AM. Check serum mag level.  Check EKG, as above.  Hold home Eliquis  for now, as above.  Monitor on telemetry.                      #) Essential Hypertension: documented h/o such, with outpatient antihypertensive regimen including Norvasc , doxazosin .  SBP's in the ED today: 1 teens 120s mmHg.   Plan: Close monitoring of subsequent BP via routine VS. in the setting of current n.p.o. status, will hold him  antihypertensive medications for now.                      #) Hyperlipidemia: documented h/o such. On high intensity rosuvastatin  as outpatient.   Plan: In the setting of current n.p.o. status, will hold home statin for now.                     #) Depression: documented h/o such. On Lexapro  as outpatient.    Plan: In the setting of current n.p.o. status, will hold home Lexapro  for now.       DVT prophylaxis: SCD's   Code Status: DNR/DNI, active DNR form  on file. Family Communication: none Disposition Plan: Per Rounding Team Consults called:  EDP d/w on-call orthopedic surgeon, Dr. [facility] of [facility], who will formally consult and is planning to take patient to the OR in the morning (8/12).;  Admission status: Inpatient     I SPENT GREATER THAN 75  MINUTES IN CLINICAL CARE TIME/MEDICAL DECISION-MAKING IN COMPLETING THIS ADMISSION.      Braeson Rupe B Jakaria Lavergne DO [facility]  From 7PM - 7AM   10/08/2023, 11:13 PM

## 2023-10-08 NOTE — ED Provider Notes (Signed)
 Elderly 89 year old male presents from nursing facility after having a fall, the patient did strike his head, has a hematoma to the left forehead a skin tear to the left elbow and pain in his left hip.  On exam he has a shortened and externally rotated left lower extremity with edema of bilateral lower extremities.  He has no decreased range of motion of his either of his arms but does have skin tears over the left elbow.  He has a small hematoma to the left forehead just above the eyebrow, able to follow some commands but is had a recent stroke and is essentially nonverbal.  Normal level of alertness, will need to be admitted, likely has hip fracture, CT scans of the head and cervical spine ordered, I do not think he needs to have reversal of his Eliquis  at this time.  Pt is critically ill with acute on chronic kidney injury - with head injury and hip fracture -  on eliquis .  Needs admission to hospital  Discussed with Dr. Cathern who will admit, discussed with Dr. Elsa who will consult from the orthopedic service.   Cleotilde Rogue, MD 10/08/23 [id number]

## 2023-10-08 NOTE — ED Notes (Signed)
 FALL RISK ARMBAND APPLIED BY EMS AT REGISTRATION

## 2023-10-08 NOTE — ED Triage Notes (Signed)
 Pt was BIB [facility] from [facility]. Pt was ambulating with aid from toilet to the walker when he fell and hit his head on the ground. 2 in laceration on left eyebrow. Skin tear on left elbow. Pt takes eloquis. Pt is on 2 L [place] at baseline PERRLA  Bp 114/48 61 HR 18 rr 95%@L  [place] 125 CBG  Nonverbal do to recent stroke per EMS DNR IN place at bedside

## 2023-10-09 ENCOUNTER — Encounter (HOSPITAL_COMMUNITY): Admission: EM | Disposition: E | Payer: Self-pay | Source: Skilled Nursing Facility | Attending: Internal Medicine

## 2023-10-09 ENCOUNTER — Other Ambulatory Visit: Payer: Self-pay

## 2023-10-09 ENCOUNTER — Encounter (HOSPITAL_COMMUNITY): Payer: Self-pay | Admitting: Anesthesiology

## 2023-10-09 ENCOUNTER — Inpatient Hospital Stay (HOSPITAL_COMMUNITY)

## 2023-10-09 ENCOUNTER — Encounter (HOSPITAL_COMMUNITY): Payer: Self-pay | Admitting: Internal Medicine

## 2023-10-09 DIAGNOSIS — W19XXXA Unspecified fall, initial encounter: Secondary | ICD-10-CM | POA: Diagnosis not present

## 2023-10-09 DIAGNOSIS — D649 Anemia, unspecified: Secondary | ICD-10-CM | POA: Diagnosis present

## 2023-10-09 DIAGNOSIS — S72002A Fracture of unspecified part of neck of left femur, initial encounter for closed fracture: Secondary | ICD-10-CM | POA: Diagnosis not present

## 2023-10-09 DIAGNOSIS — Z8679 Personal history of other diseases of the circulatory system: Secondary | ICD-10-CM | POA: Diagnosis not present

## 2023-10-09 DIAGNOSIS — E870 Hyperosmolality and hypernatremia: Secondary | ICD-10-CM | POA: Diagnosis not present

## 2023-10-09 DIAGNOSIS — I4819 Other persistent atrial fibrillation: Secondary | ICD-10-CM | POA: Diagnosis present

## 2023-10-09 DIAGNOSIS — R6510 Systemic inflammatory response syndrome (SIRS) of non-infectious origin without acute organ dysfunction: Secondary | ICD-10-CM | POA: Diagnosis present

## 2023-10-09 LAB — COMPREHENSIVE METABOLIC PANEL WITH GFR
ALT: 21 U/L (ref 0–44)
AST: 16 U/L (ref 15–41)
Albumin: 2.5 g/dL — ABNORMAL LOW (ref 3.5–5.0)
Alkaline Phosphatase: 106 U/L (ref 38–126)
Anion gap: 12 (ref 5–15)
BUN: 90 mg/dL — ABNORMAL HIGH (ref 8–23)
CO2: 19 mmol/L — ABNORMAL LOW (ref 22–32)
Calcium: 7.6 mg/dL — ABNORMAL LOW (ref 8.9–10.3)
Chloride: 118 mmol/L — ABNORMAL HIGH (ref 98–111)
Creatinine, Ser: 4.53 mg/dL — ABNORMAL HIGH (ref 0.61–1.24)
GFR, Estimated: 12 mL/min — ABNORMAL LOW (ref >60)
Glucose, Bld: 112 mg/dL — ABNORMAL HIGH (ref 70–99)
Potassium: 4.3 mmol/L (ref 3.5–5.1)
Sodium: 149 mmol/L — ABNORMAL HIGH (ref 135–145)
Total Bilirubin: 0.9 mg/dL (ref 0.0–1.2)
Total Protein: 5 g/dL — ABNORMAL LOW (ref 6.5–8.1)

## 2023-10-09 LAB — CBC WITH DIFFERENTIAL/PLATELET
Abs Immature Granulocytes: 0.01 K/uL (ref 0.00–0.07)
Basophils Absolute: 0 K/uL (ref 0.0–0.1)
Basophils Relative: 0 %
Eosinophils Absolute: 0 K/uL (ref 0.0–0.5)
Eosinophils Relative: 1 %
HCT: 18.3 % — ABNORMAL LOW (ref 39.0–52.0)
Hemoglobin: 5.6 g/dL — CL (ref 13.0–17.0)
Immature Granulocytes: 0 %
Lymphocytes Relative: 6 %
Lymphs Abs: 0.3 K/uL — ABNORMAL LOW (ref 0.7–4.0)
MCH: 32.7 pg (ref 26.0–34.0)
MCHC: 30.6 g/dL (ref 30.0–36.0)
MCV: 107 fL — ABNORMAL HIGH (ref 80.0–100.0)
Monocytes Absolute: 0.6 K/uL (ref 0.1–1.0)
Monocytes Relative: 13 %
Neutro Abs: 3.6 K/uL (ref 1.7–7.7)
Neutrophils Relative %: 80 %
Platelets: 82 K/uL — ABNORMAL LOW (ref 150–400)
RBC: 1.71 MIL/uL — ABNORMAL LOW (ref 4.22–5.81)
RDW: 18.1 % — ABNORMAL HIGH (ref 11.5–15.5)
WBC: 4.5 K/uL (ref 4.0–10.5)
nRBC: 0 % (ref 0.0–0.2)

## 2023-10-09 LAB — OSMOLALITY, URINE: Osmolality, Ur: 381 mosm/kg (ref 300–900)

## 2023-10-09 LAB — URINALYSIS, COMPLETE (UACMP) WITH MICROSCOPIC
Bilirubin Urine: NEGATIVE
Glucose, UA: NEGATIVE mg/dL
Hgb urine dipstick: NEGATIVE
Ketones, ur: NEGATIVE mg/dL
Leukocytes,Ua: NEGATIVE
Nitrite: NEGATIVE
Protein, ur: 30 mg/dL — AB
Specific Gravity, Urine: 1.013 (ref 1.005–1.030)
pH: 5 (ref 5.0–8.0)

## 2023-10-09 LAB — CBC
HCT: 21.8 % — ABNORMAL LOW (ref 39.0–52.0)
Hemoglobin: 7 g/dL — ABNORMAL LOW (ref 13.0–17.0)
MCH: 33.7 pg (ref 26.0–34.0)
MCHC: 32.1 g/dL (ref 30.0–36.0)
MCV: 104.8 fL — ABNORMAL HIGH (ref 80.0–100.0)
Platelets: 92 K/uL — ABNORMAL LOW (ref 150–400)
RBC: 2.08 MIL/uL — ABNORMAL LOW (ref 4.22–5.81)
RDW: 18.1 % — ABNORMAL HIGH (ref 11.5–15.5)
WBC: 4.1 K/uL (ref 4.0–10.5)
nRBC: 0 % (ref 0.0–0.2)

## 2023-10-09 LAB — IRON AND TIBC
Iron: 31 ug/dL — ABNORMAL LOW (ref 45–182)
Saturation Ratios: 15 % — ABNORMAL LOW (ref 17.9–39.5)
TIBC: 203 ug/dL — ABNORMAL LOW (ref 250–450)
UIBC: 172 ug/dL

## 2023-10-09 LAB — POCT I-STAT, CHEM 8
BUN: 78 mg/dL — ABNORMAL HIGH (ref 8–23)
Calcium, Ion: 1.03 mmol/L — ABNORMAL LOW (ref 1.15–1.40)
Chloride: 117 mmol/L — ABNORMAL HIGH (ref 98–111)
Creatinine, Ser: 4.8 mg/dL — ABNORMAL HIGH (ref 0.61–1.24)
Glucose, Bld: 111 mg/dL — ABNORMAL HIGH (ref 70–99)
HCT: 20 % — ABNORMAL LOW (ref 39.0–52.0)
Hemoglobin: 6.8 g/dL — CL (ref 13.0–17.0)
Potassium: 4.4 mmol/L (ref 3.5–5.1)
Sodium: 149 mmol/L — ABNORMAL HIGH (ref 135–145)
TCO2: 19 mmol/L — ABNORMAL LOW (ref 22–32)

## 2023-10-09 LAB — TSH: TSH: 5.756 u[IU]/mL — ABNORMAL HIGH (ref 0.350–4.500)

## 2023-10-09 LAB — SURGICAL PCR SCREEN
MRSA, PCR: NEGATIVE
Staphylococcus aureus: POSITIVE — AB

## 2023-10-09 LAB — CREATININE, URINE, RANDOM: Creatinine, Urine: 78 mg/dL

## 2023-10-09 LAB — FERRITIN: Ferritin: 75 ng/mL (ref 24–336)

## 2023-10-09 LAB — SODIUM, URINE, RANDOM: Sodium, Ur: 32 mmol/L

## 2023-10-09 LAB — CK: Total CK: 231 U/L (ref 49–397)

## 2023-10-09 LAB — VITAMIN B12: Vitamin B-12: 536 pg/mL (ref 180–914)

## 2023-10-09 LAB — MAGNESIUM: Magnesium: 2.3 mg/dL (ref 1.7–2.4)

## 2023-10-09 LAB — HEMOGLOBIN AND HEMATOCRIT, BLOOD
HCT: 27 % — ABNORMAL LOW (ref 39.0–52.0)
Hemoglobin: 8.7 g/dL — ABNORMAL LOW (ref 13.0–17.0)

## 2023-10-09 LAB — PREPARE RBC (CROSSMATCH)

## 2023-10-09 LAB — FOLATE: Folate: 12.1 ng/mL (ref >5.9)

## 2023-10-09 LAB — OSMOLALITY: Osmolality: 341 mosm/kg (ref 275–295)

## 2023-10-09 LAB — BRAIN NATRIURETIC PEPTIDE: B Natriuretic Peptide: 652.4 pg/mL — ABNORMAL HIGH (ref 0.0–100.0)

## 2023-10-09 SURGERY — FIXATION, FRACTURE, INTERTROCHANTERIC, WITH INTRAMEDULLARY ROD
Anesthesia: General | Laterality: Left

## 2023-10-09 MED ORDER — CHLORHEXIDINE GLUCONATE 4 % EX SOLN
60.0000 mL | Freq: Once | CUTANEOUS | Status: AC
  Administered 2023-10-09 (×2): 4 via TOPICAL
  Filled 2023-10-09: qty 60

## 2023-10-09 MED ORDER — FENTANYL CITRATE (PF) 100 MCG/2ML IJ SOLN: 25.0000 ug | INTRAMUSCULAR | Status: DC | PRN

## 2023-10-09 MED ORDER — POVIDONE-IODINE 10 % EX SWAB
2.0000 | Freq: Once | CUTANEOUS | Status: AC
  Administered 2023-10-09 (×2): 2 via TOPICAL

## 2023-10-09 MED ORDER — 0.9 % SODIUM CHLORIDE (POUR BTL) OPTIME
TOPICAL | Status: DC | PRN
  Administered 2023-10-09 (×2): 1000 mL

## 2023-10-09 MED ORDER — NALOXONE HCL 0.4 MG/ML IJ SOLN: 0.4000 mg | INTRAMUSCULAR | Status: DC | PRN

## 2023-10-09 MED ORDER — LACTATED RINGERS IV SOLN: INTRAVENOUS | Status: DC

## 2023-10-09 MED ORDER — TRANEXAMIC ACID-NACL 1000-0.7 MG/100ML-% IV SOLN: 1000.0000 mg | INTRAVENOUS | Status: DC

## 2023-10-09 MED ORDER — FENTANYL CITRATE (PF) 250 MCG/5ML IJ SOLN
INTRAMUSCULAR | Status: AC
  Filled 2023-10-09: qty 5

## 2023-10-09 MED ORDER — CHLORHEXIDINE GLUCONATE 0.12 % MT SOLN: 15.0000 mL | Freq: Once | OROMUCOSAL | Status: DC

## 2023-10-09 MED ORDER — SODIUM CHLORIDE 0.9% IV SOLUTION: Freq: Once | INTRAVENOUS | Status: DC

## 2023-10-09 MED ORDER — CHLORHEXIDINE GLUCONATE 0.12 % MT SOLN
OROMUCOSAL | Status: AC
  Filled 2023-10-09: qty 15

## 2023-10-09 MED ORDER — TRANEXAMIC ACID-NACL 1000-0.7 MG/100ML-% IV SOLN
INTRAVENOUS | Status: AC
  Filled 2023-10-09: qty 100

## 2023-10-09 MED ORDER — ORAL CARE MOUTH RINSE: 15.0000 mL | Freq: Once | OROMUCOSAL | Status: DC

## 2023-10-09 MED ORDER — FENTANYL CITRATE (PF) 100 MCG/2ML IJ SOLN
INTRAMUSCULAR | Status: AC
  Administered 2023-10-09 (×2): 25 ug via INTRAVENOUS
  Filled 2023-10-09: qty 2

## 2023-10-09 MED ORDER — CEFAZOLIN SODIUM-DEXTROSE 2-4 GM/100ML-% IV SOLN
INTRAVENOUS | Status: AC
  Filled 2023-10-09: qty 100

## 2023-10-09 MED ORDER — FENTANYL CITRATE PF 50 MCG/ML IJ SOSY
25.0000 ug | PREFILLED_SYRINGE | INTRAMUSCULAR | Status: DC | PRN
  Administered 2023-10-09 (×6): 25 ug via INTRAVENOUS
  Filled 2023-10-09 (×3): qty 1

## 2023-10-09 MED ORDER — SODIUM CHLORIDE 0.9 % IV SOLN: INTRAVENOUS | Status: DC

## 2023-10-09 MED ORDER — SODIUM CHLORIDE 0.9% IV SOLUTION: Freq: Once | INTRAVENOUS | Status: AC

## 2023-10-09 MED ORDER — CEFAZOLIN SODIUM-DEXTROSE 2-4 GM/100ML-% IV SOLN: 2.0000 g | INTRAVENOUS | Status: DC

## 2023-10-09 NOTE — ED Notes (Signed)
 Pts HGB 5.6, transfusion to be completed, but refusal of blood products documented in chart. Daughter unaware of blood transfusion refusal. She stated she will sign blood consent but will call hospice first to be sure it is ok at this time.

## 2023-10-09 NOTE — ED Notes (Signed)
 CCMD notified

## 2023-10-09 NOTE — ED Notes (Signed)
Pt moved to hospital bed for patient comfort.

## 2023-10-09 NOTE — Progress Notes (Signed)
 GIP  [facility] Liaison Note   [name] is a current patient with [facility] with a terminal diagnosis of hypertensive heart disease with heart failure. Patient  was admitted 10/08/2023 after a fall at his ALF. Per Dr. Todd [name]  with [facility] this is a related hospital admission. Patient is a DNR.   Visited with patient and family at the bedside in the ED. Patient is nonverbal but family states that he received pain medication and is now comfortable. He is scheduled for hip repair surgery today.   Met with attending MD Dr. Darci to discuss pts care and continuing medication to ensure adequate symptom management.    Patient remains inpatient appropriate for skilled monitoring, assessment and medication titration for complex symptom management.   V/S: 97.9, 91/78, 97, 21 Sats 100% on 3LNC   I/O: not recorded   Abnormal Labs: BUN 78, Cr 4.80, Hgb 6.8 Diagnostics: left hip fracture   IV/PRN medications: fentanyl  25mcg inj Q2H PRN x4   Recommendations/Plan per Dr. Emmit progress note dated 10/08/23:      Principal Problem:   Closed left hip fracture (HCC) Active Problems:   HLD (hyperlipidemia)   History of essential hypertension   Depression   Acute renal failure superimposed on stage 4 chronic kidney disease (HCC)   Fall at home, initial encounter   Acute on chronic anemia   Acute hypernatremia   SIRS (systemic inflammatory response syndrome) (HCC)   Persistent atrial fibrillation (HCC)    #) Acute displaced subtrochanteric fracture of proximal left femur: confirmed via presenting plain films and stemming from ground level mechanical fall without associated loss of consciousness that occurred earlier on the day of admission at SNF, as further described above.  Patient noted to be on Eliquis  as an outpatient in the setting of persistent atrial fibrillation.    EDP d/w on-call orthopedic surgeon, Dr. Elsa  [facility], who will formally consult and is planning to take patient to the OR in the morning (8/12). It appears that patient is scheduledthe for surgery at 735 AM.   Chest x-ray shows no focal resolution in the pulmonary edema noted on chest x-ray in April 2025, now with interstitial edema, suggesting improvement in volume status, with persistent pleural effusions.  Preoperative EKG pending.  Overall, in the absence of overt evidence to suggest acutely decompensated heart failure or acute MI, failure no absolute contraindications to proceeding with proposed orthopedic surgery at this time.   [name] Score for this patient in the context of anticipated aforementioned orthopedic surgery conveys a 12.6% perioperative risk for significant cardiac event.    Plan: Formal orthopedic surgery consult for definitive surgical management, with plan to take patient to the OR tomorrow (8/12). NPO after MN in anticipation of this procedure.  No pharmacologic anticoagulation leading up to this anticipated surgery.  Hold outpatient Eliquis .  SCD's. Prn IV fentanyl . Anticipate postoperative PT consult. Pre-op EKG has been ordered.  Type and screen ordered.    #) Ground level mechanical fall: As reported by SNF staff, without any loss of consciousness .  Patient did hit his head as a component of this fall, which is notable in the context of being chronically anticoagulated on Eliquis .  However, CT head shows no evidence of acute intracranial process, including no evidence of intra hemorrhage We will CT 0 spine shows no evidence of acute cervical spine fracture or subluxation injury.  Aside from his presenting acute left hip fracture, traumatic  workup performed in the ED showed no evidence of additional acute traumatic fracture.  In terms of predisposing factors it may have increased likelihood of the patient, the mechanical fall, there is no overt evidence of infectious process at this time, including chest x-ray which  showed no evidence of infiltrate.  Will also check urinalysis.   Plan: Check urinalysis, as above.  CMP and CBC with differential in the morning. Fall precautions. Anticipate postoperative PT consult.     #) Acute Kidney Injury superimposed on CKD stage IV:   In the context of his history of CKD stage IV with baseline creatinine 3.2-3.4, most recent prior creatinine noted to be 3.34 on 06/27/2023, he is currently receiving demonstrates interval elevation today at 4.46.  Volume status for this patient complicated by his history of chronic right-sided systolic heart failure.  Clinically, he appears slightly intravascularly dry.  Will pursue further diagnostic evaluation of the patient's acute kidney injury with some very gentle IV fluids and close monitoring of ensuing renal function trend as outlined below.    Plan: monitor strict I's & O's and daily weights. Attempt to avoid nephrotoxic agents. Refrain from NSAIDs. Repeat CMP in the morning. Check serum magnesium level.  Check urinalysis with microscopy.  Add-on random urine sodium and random urine creatinine.  Check CPK level. LR 75/hr x 8 hrs.   #) Acute on chronic anemia: In the setting of history of anemia of chronic disease with baseline hemoglobin range of 9-10, presenting hemoglobin is slightly lower at 7.8.  No overt evidence of acute contributory blood loss at this time.  Potential contribution from interval decline in renal function, as outlined above.  His anemia is noted to be macrocytic in nature, with remainder of CBC, potentially suggestive of a myelodysplastic pathology.  He has no known history of underlying liver disease, and liver enzymes were found to be nonelevated this evening.  Will also check B12 and folic acid levels.  No indication for PRBC transfusion at this time.  Appears medically stable.  Type and screen ordered.  INR this evening is 1.5 in the setting of chronic anticoagulation on Eliquis .   Plan: Type and screen.  Add on  iron studies, B12 level, folic acid level.  CBC in the morning.  #) Acute hypernatremia: Presenting sodium 147 compared to most recent prior value of 139 on 06/27/2023.  As noted above, evaluation of his volume status is complicated, although some suspicion for a degree of intravascular depletion, including potential inadequate free water  intake.  In the setting of his presenting fall with head trauma, differential would also include central diabetes insipidus.  Not on lithium as an outpatient.  Will pursue additional diagnostic evaluation of patient's acute hyponatremia, as below, will providing very gentle IV fluids overnight with LR, and close monitoring of in setting trend and serum sodium as well as close monitoring of his hearing volume status, as outlined below.   Plan: Check urinalysis, random urine sodium, random urine creatinine, urine osmolality, serum osmolality.  Monitor strict I's and O's and daily weights.  Repeat CMP in the morning.   Lactated Ringer's at 75 cc x 8 hours.  Check BNP.    #) SIRS criteria present: Presentation associated with leukopenia, hypothermia, tachypnea.  However, in the absence of e/o underlying infection at this time, including, chest x-ray which shows no evidence of infiltrate, criteria for sepsis not currently met.  patient appears hemodynamically stable at this time.  Consequently, will refrain from initiation of IV antibiotics  at this time.    Plan: Repeat CBC with diff in the AM.  Monitor strict I's and O's and daily weights.  Monitor on telemetry. Refraining from IV abx for now, as above.  Check urinalysis.   #) Persistent atrial fibrillation: Documented history of such. In setting of CHA2DS2-VASc score of  6, there is an indication for chronic anticoagulation for thromboembolic prophylaxis. Consistent with this, patient is chronically anticoagulated on Eliquis , which will be held for now in the context of presenting acute left hip fracture with plan for  definitive surgical management thereafter in the morning. Home AV nodal blocking regimen: None.  Most recent echocardiogram was performed in April 2025, with results that include severely dilated bilateral atria, predisposing him to the persistent/chronic nature of his atrial fibrillation, with additional results as conveyed above.  Appears to be in rate controlled atrial fibrillation this evening.   Plan: monitor strict I's & O's and daily weights. CMP/CBC in AM. Check serum mag level.  Check EKG, as above.  Hold home Eliquis  for now, as above.  Monitor on telemetry.    #) Essential Hypertension: documented h/o such, with outpatient antihypertensive regimen including Norvasc , doxazosin .  SBP's in the ED today: 1 teens 120s mmHg.    Plan: Close monitoring of subsequent BP via routine VS. in the setting of current n.p.o. status, will hold him antihypertensive medications for now.   #) Hyperlipidemia: documented h/o such. On high intensity rosuvastatin  as outpatient.    Plan: In the setting of current n.p.o. status, will hold home meds.   #) Depression: documented h/o such. On Lexapro  as outpatient.     Plan: In the setting of current n.p.o. status, will hold home Lexapro  for now.   Schedule morphine  5mg  q4hours, will also leave breakthrough dose Schedule alprazolam for anxiety. Most of her pain is with turning and position and also with wound care, Need to premedicate before turning or cleaning. If wound continues to be painful will use topical morphine  in the wound bed.   Discharge Planning: per discussion with daughter and daughter in law at the bedside, likely no rehab, return to independent living apartment with the support of hospice services.    Family contact:Verna Joesph [phone]   IDT: updated   Goals of care: clear, comfort focused care, DNR   Should patient need ambulance transport, please use [facility] as they contract this service for our active hospice patients.   Please  call with any hospice questions or concerns.   Greig Basket, BSN, RN [facility] Liaison [phone]

## 2023-10-09 NOTE — ED Notes (Signed)
 [facility] called to inquire if he will go to surgery before 8 am to make arrangements for hospice billing.

## 2023-10-09 NOTE — Consult Note (Addendum)
 Reason for Consult:Left hip fx Referring Physician: Medford [name] Time called: 9256 Time at bedside: 0907   [name] is an 89 y.o. male.  HPI: [name] fell at home yesterday. He was brought to the ED where x-rays showed a left hip fx and orthopedic surgery was consulted. He is non-verbal and history provided by family. He has been getting progressively weaker, especially in the last week or two, and can barely ambulate with help. He is at home with hospice.   Past Medical History:  Diagnosis Date   Actinic keratosis    BPH with urinary obstruction    Bradycardia    CAD (coronary artery disease)    CABG x5   Cerebrovascular accident (stroke) (HCC)    With right hemiparesis and persistent expressive aphasia   GERD (gastroesophageal reflux disease)    History of hydronephrosis    Hx of hydronephrosis 07/08/2011   right    Hyperlipidemia    Hypertension    Iron deficiency anemia due to chronic blood loss 03/26/2007   Now resolved     Iron deficiency anemia secondary to blood loss (chronic)    Persistent atrial fibrillation (HCC)    Pulmonary hypertension (HCC)    by echo 2013   Rosacea    Situational depression    Severe   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   treated after biopsy   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 He has never used smokeless tobacco. He reports current alcohol   use of about 1.0 standard drink of alcohol  per week. He reports that he does not use drugs.  Allergies:  Allergies  Allergen Reactions   Ciprofloxacin Hcl Other (See Comments)    Reaction unknown by family   Sulfamethoxazole-Trimethoprim Other (See Comments)    Reaction unknown by family    Medications: I have reviewed the patient's current medications.  Results for orders placed or performed during the hospital encounter of 10/08/23 (from the past 48 hours)  Type and screen [facility]     Status: None (Preliminary result)   Collection Time: 10/08/23  8:30 PM  Result Value Ref Range   ABO/RH(D) A POS    Antibody Screen NEG    Sample Expiration 10/11/2023,2359    Unit Number [id number]    Blood Component Type RED CELLS,LR    Unit division 00    Status of Unit ISSUED    Transfusion Status OK TO TRANSFUSE    Crossmatch Result      Compatible Performed at [facility], [address], [place], [place] [address]   CBC with Differential  Status: Abnormal   Collection Time: 10/08/23  8:36 PM  Result Value Ref Range   WBC 3.9 (L) 4.0 - 10.5 K/uL   RBC 2.38 (L) 4.22 - 5.81 MIL/uL   Hemoglobin 7.8 (L) 13.0 - 17.0 g/dL   HCT 74.5 (L) 60.9 - 47.9 %   MCV 106.7 (H) 80.0 - 100.0 fL   MCH 32.8 26.0 - 34.0 pg   MCHC 30.7 30.0 - 36.0 g/dL   RDW 81.6 (H) 88.4 - 84.4 %   Platelets 109 (L) 150 - 400 K/uL   nRBC 0.0 0.0 - 0.2 %   Neutrophils Relative % 65 %   Neutro Abs 2.5 1.7 - 7.7 K/uL   Lymphocytes Relative 19 %   Lymphs Abs 0.7 0.7 - 4.0 K/uL   Monocytes Relative 12 %   Monocytes Absolute 0.5 0.1 - 1.0 K/uL   Eosinophils Relative 3 %   Eosinophils Absolute 0.1 0.0 - 0.5 K/uL   Basophils Relative 1 %   Basophils Absolute 0.0 0.0 - 0.1 K/uL   Immature Granulocytes 0 %   Abs Immature Granulocytes 0.01 0.00 - 0.07 K/uL    Comment: Performed at [facility], [address], [place], [place] [address]  Comprehensive metabolic panel     Status:  Abnormal   Collection Time: 10/08/23  8:36 PM  Result Value Ref Range   Sodium 147 (H) 135 - 145 mmol/L   Potassium 4.2 3.5 - 5.1 mmol/L   Chloride 115 (H) 98 - 111 mmol/L   CO2 19 (L) 22 - 32 mmol/L   Glucose, Bld 118 (H) 70 - 99 mg/dL    Comment: Glucose reference range applies only to samples taken after fasting for at least 8 hours.   BUN 89 (H) 8 - 23 mg/dL   Creatinine, Ser 5.53 (H) 0.61 - 1.24 mg/dL   Calcium  7.8 (L) 8.9 - 10.3 mg/dL   Total Protein 6.2 (L) 6.5 - 8.1 g/dL   Albumin 3.0 (L) 3.5 - 5.0 g/dL   AST 17 15 - 41 U/L   ALT 25 0 - 44 U/L   Alkaline Phosphatase 134 (H) 38 - 126 U/L   Total Bilirubin 0.8 0.0 - 1.2 mg/dL   GFR, Estimated 12 (L) >60 mL/min    Comment: (NOTE) Calculated using the CKD-EPI Creatinine Equation (2021)    Anion gap 13 5 - 15    Comment: Performed at [facility], [address] [place], [place] [address]  Protime-INR     Status: Abnormal   Collection Time: 10/08/23  8:36 PM  Result Value Ref Range   Prothrombin Time 18.7 (H) 11.4 - 15.2 seconds   INR 1.5 (H) 0.8 - 1.2    Comment: (NOTE) INR goal varies based on device and disease states. Performed at [facility], [address], [place], [place] [address]   Magnesium     Status: Abnormal   Collection Time: 10/08/23  8:36 PM  Result Value Ref Range   Magnesium 2.5 (H) 1.7 - 2.4 mg/dL    Comment: Performed at [facility], [address], [place], [place] [address]  TSH     Status: Abnormal   Collection Time: 10/08/23  8:36 PM  Result Value Ref Range   TSH 5.756 (H) 0.350 - 4.500 uIU/mL    Comment: Performed by a 3rd Generation assay with a functional sensitivity of <=0.01 uIU/mL. Performed at [facility], [address], [place], [place]  [address]   Folate     Status: None   Collection Time: 10/08/23  8:36 PM  Result Value Ref Range   Folate 12.1 >5.9 ng/mL    Comment: Performed at [facility], [address] [place], [place] [address]   Osmolality     Status: Abnormal   Collection Time: 10/08/23  8:36 PM  Result Value Ref Range   Osmolality 338 (HH) 275 - 295 mOsm/kg    Comment: CRITICAL RESULT CALLED TO, READ BACK BY AND VERIFIED WITH: RNMICAEL OATS [phone]  @[phone]   Performed at [facility], [address] [place], [place] [address]   I-Stat CG4 Lactic Acid, ED     Status: None   Collection Time: 10/08/23  8:37 PM  Result Value Ref Range   Lactic Acid, Venous 1.5 0.5 - 1.9 mmol/L  I-stat chem 8, ed     Status: Abnormal   Collection Time: 10/08/23  8:37 PM  Result Value Ref Range   Sodium 149 (H) 135 - 145 mmol/L   Potassium 4.2 3.5 - 5.1 mmol/L   Chloride 115 (H) 98 - 111 mmol/L   BUN 79 (H) 8 - 23 mg/dL   Creatinine, Ser 5.49 (H) 0.61 - 1.24 mg/dL   Glucose, Bld 887 (H) 70 - 99 mg/dL    Comment: Glucose reference range applies only to samples taken after fasting for at least 8 hours.   Calcium , Ion 1.06 (L) 1.15 - 1.40 mmol/L   TCO2 18 (L) 22 - 32 mmol/L   Hemoglobin 8.5 (L) 13.0 - 17.0 g/dL   HCT 74.9 (L) 60.9 - 47.9 %  CBC with Differential/Platelet     Status: Abnormal   Collection Time: 10/09/23  4:55 AM  Result Value Ref Range   WBC 4.5 4.0 - 10.5 K/uL   RBC 1.71 (L) 4.22 - 5.81 MIL/uL   Hemoglobin 5.6 (LL) 13.0 - 17.0 g/dL    Comment: REPEATED TO VERIFY CRITICAL RESULT CALLED TO, READ BACK BY AND VERIFIED WITH: S. ROUTH, RN AT 0556 08.12.25 JLASIGAN    HCT 18.3 (L) 39.0 - 52.0 %   MCV 107.0 (H) 80.0 - 100.0 fL   MCH 32.7 26.0 - 34.0 pg   MCHC 30.6 30.0 - 36.0 g/dL   RDW 81.8 (H) 88.4 - 84.4 %   Platelets 82 (L) 150 - 400 K/uL    Comment: SPECIMEN CHECKED FOR CLOTS REPEATED TO VERIFY OKAY TO RELEASE PER RN    nRBC 0.0 0.0 - 0.2 %   Neutrophils Relative % 80 %   Neutro Abs 3.6 1.7 - 7.7 K/uL   Lymphocytes Relative 6 %   Lymphs Abs 0.3 (L) 0.7 - 4.0 K/uL   Monocytes Relative 13 %   Monocytes Absolute 0.6 0.1 - 1.0 K/uL   Eosinophils Relative 1 %   Eosinophils Absolute 0.0 0.0 - 0.5  K/uL   Basophils Relative 0 %   Basophils Absolute 0.0 0.0 - 0.1 K/uL   Immature Granulocytes 0 %   Abs Immature Granulocytes 0.01 0.00 - 0.07 K/uL    Comment: Performed at [facility], [address], [place], [place] [address]  Comprehensive metabolic panel with GFR     Status: Abnormal   Collection Time: 10/09/23  4:55 AM  Result Value Ref Range   Sodium 149 (H) 135 - 145 mmol/L   Potassium 4.3 3.5 - 5.1 mmol/L   Chloride 118 (H) 98 - 111 mmol/L   CO2 19 (L)  22 - 32 mmol/L   Glucose, Bld 112 (H) 70 - 99 mg/dL    Comment: Glucose reference range applies only to samples taken after fasting for at least 8 hours.   BUN 90 (H) 8 - 23 mg/dL   Creatinine, Ser 5.46 (H) 0.61 - 1.24 mg/dL   Calcium  7.6 (L) 8.9 - 10.3 mg/dL   Total Protein 5.0 (L) 6.5 - 8.1 g/dL   Albumin 2.5 (L) 3.5 - 5.0 g/dL   AST 16 15 - 41 U/L   ALT 21 0 - 44 U/L   Alkaline Phosphatase 106 38 - 126 U/L   Total Bilirubin 0.9 0.0 - 1.2 mg/dL   GFR, Estimated 12 (L) >60 mL/min    Comment: (NOTE) Calculated using the CKD-EPI Creatinine Equation (2021)    Anion gap 12 5 - 15    Comment: Performed at [facility], [address], [place], [place] [address]  Magnesium     Status: None   Collection Time: 10/09/23  4:55 AM  Result Value Ref Range   Magnesium 2.3 1.7 - 2.4 mg/dL    Comment: Performed at [facility], [address], [place], [place] [address]  Ferritin     Status: None   Collection Time: 10/09/23  4:55 AM  Result Value Ref Range   Ferritin 75 24 - 336 ng/mL    Comment: Performed at [facility], [address] [place], [place] [address]  Iron and TIBC     Status: Abnormal   Collection Time: 10/09/23  4:55 AM  Result Value Ref Range   Iron 31 (L) 45 - 182 ug/dL   TIBC 796 (L) 749 - 549 ug/dL   Saturation Ratios 15 (L) 17.9 - 39.5 %   UIBC 172 ug/dL    Comment: Performed at [facility], [address] [place], [place] [address]  Brain natriuretic peptide     Status:  Abnormal   Collection Time: 10/09/23  4:55 AM  Result Value Ref Range   B Natriuretic Peptide 652.4 (H) 0.0 - 100.0 pg/mL    Comment: Performed at [facility], [address] [place], [place] [address]  Osmolality     Status: Abnormal   Collection Time: 10/09/23  4:55 AM  Result Value Ref Range   Osmolality 341 (HH) 275 - 295 mOsm/kg    Comment: REPEATED TO VERIFY CRITICAL RESULT CALLED TO, READ BACK BY AND VERIFIED WITH: CHARM PEREYRA, PARAMEDIC [phone] 10/09/2023 BY MACEDA,J. Performed at [facility], [address] [place], [place] [address]   CK     Status: None   Collection Time: 10/09/23  4:55 AM  Result Value Ref Range   Total CK 231 49 - 397 U/L    Comment: Performed at [facility], [address], [place], [place] [address]  Vitamin B12     Status: None   Collection Time: 10/09/23  4:55 AM  Result Value Ref Range   Vitamin B-12 536 180 - 914 pg/mL    Comment: (NOTE) This assay is not validated for testing neonatal or myeloproliferative syndrome specimens for Vitamin B12 levels. Performed at [facility], [address] [place], [place] [address]   Prepare RBC (crossmatch)     Status: None   Collection Time: 10/09/23  6:43 AM  Result Value Ref Range   Order Confirmation      ORDER PROCESSED BY BLOOD BANK Performed at [facility]  Lab, [address], [place], [place] [address]   Sodium, urine, random     Status: None   Collection Time: 10/09/23  6:47 AM  Result Value Ref Range   Sodium, Ur 32 mmol/L    Comment: Performed at [facility], [address] [place], [place] [address]  Creatinine, urine, random     Status: None   Collection Time: 10/09/23  6:47 AM  Result Value Ref Range   Creatinine, Urine 78 mg/dL    Comment: Performed at [facility], [address] [place], [place] [address]  Osmolality, urine     Status: None   Collection Time: 10/09/23  6:47 AM  Result Value Ref Range   Osmolality, Ur 381 300 - 900 mOsm/kg     Comment: Performed at [facility], [address], [place], [place] [address]    CT Head Wo Contrast Result Date: 10/08/2023 CLINICAL DATA:  Recent fall with headaches and neck pain, initial encounter EXAM: CT HEAD WITHOUT CONTRAST CT CERVICAL SPINE WITHOUT CONTRAST TECHNIQUE: Multidetector CT imaging of the head and cervical spine was performed following the standard protocol without intravenous contrast. Multiplanar CT image reconstructions of the cervical spine were also generated. RADIATION DOSE REDUCTION: This exam was performed according to the departmental dose-optimization program which includes automated exposure control, adjustment of the mA and/or kV according to patient size and/or use of iterative reconstruction technique. COMPARISON:  09/17/2021 FINDINGS: CT HEAD FINDINGS Brain: There are again noted changes consistent with prior left MCA infarct with considerable encephalomalacia. Atrophic changes and chronic white matter ischemic changes are seen as well. No acute hemorrhage, acute infarction or space-occupying mass lesion is noted. Vascular: No hyperdense vessel or unexpected calcification. Skull: Normal. Negative for fracture or focal lesion. Sinuses/Orbits: No acute finding. Other: None. CT CERVICAL SPINE FINDINGS Alignment: Within normal limits. Skull base and vertebrae: 7 cervical segments are well visualized. Vertebral body height is well maintained. Prior fusion at C6-7 is noted. Multilevel facet hypertrophic changes and osteophytic changes are seen. No acute fracture or acute facet abnormality is noted. Soft tissues and spinal canal: Surrounding soft tissue structures are within normal limits. Upper chest: Visualized lung apices show bilateral pleural effusions which appear moderate to large worse on the right than the left. Other: None IMPRESSION: CT of the head: No acute intracranial abnormality noted. Changes of prior left MCA infarct. Chronic atrophic and ischemic changes.  CT of the cervical spine: Multilevel degenerative change without acute abnormality. Electronically Signed   By: Oneil Devonshire M.D.   On: 10/08/2023 22:35   CT Cervical Spine Wo Contrast Result Date: 10/08/2023 CLINICAL DATA:  Recent fall with headaches and neck pain, initial encounter EXAM: CT HEAD WITHOUT CONTRAST CT CERVICAL SPINE WITHOUT CONTRAST TECHNIQUE: Multidetector CT imaging of the head and cervical spine was performed following the standard protocol without intravenous contrast. Multiplanar CT image reconstructions of the cervical spine were also generated. RADIATION DOSE REDUCTION: This exam was performed according to the departmental dose-optimization program which includes automated exposure control, adjustment of the mA and/or kV according to patient size and/or use of iterative reconstruction technique. COMPARISON:  09/17/2021 FINDINGS: CT HEAD FINDINGS Brain: There are again noted changes consistent with prior left MCA infarct with considerable encephalomalacia. Atrophic changes and chronic white matter ischemic changes are seen as well. No acute hemorrhage, acute infarction or space-occupying mass lesion is noted. Vascular: No hyperdense vessel or unexpected calcification. Skull: Normal. Negative for fracture or focal lesion. Sinuses/Orbits:  No acute finding. Other: None. CT CERVICAL SPINE FINDINGS Alignment: Within normal limits. Skull base and vertebrae: 7 cervical segments are well visualized. Vertebral body height is well maintained. Prior fusion at C6-7 is noted. Multilevel facet hypertrophic changes and osteophytic changes are seen. No acute fracture or acute facet abnormality is noted. Soft tissues and spinal canal: Surrounding soft tissue structures are within normal limits. Upper chest: Visualized lung apices show bilateral pleural effusions which appear moderate to large worse on the right than the left. Other: None IMPRESSION: CT of the head: No acute intracranial abnormality noted.  Changes of prior left MCA infarct. Chronic atrophic and ischemic changes. CT of the cervical spine: Multilevel degenerative change without acute abnormality. Electronically Signed   By: Oneil Devonshire M.D.   On: 10/08/2023 22:35   DG Hip Unilat W or Wo Pelvis 2-3 Views Left Result Date: 10/08/2023 CLINICAL DATA:  trauma EXAM: DG HIP (WITH OR WITHOUT PELVIS) 2-3V LEFT COMPARISON:  September 17, 2021 FINDINGS: Osteopenia.Obliquely oriented, comminuted, and displaced subtrochanteric fracture of the proximal femur.The femoral shaft is posteromedially displaced at least 1 shaft width. The fracture line extends into the lesser trochanter, which is medially displaced 1.6 cm.No dislocation of the hip. Lumbar degenerative disc disease. Extensive aortoiliac and peripheral vascular atherosclerosis. IMPRESSION: Obliquely oriented, displaced subtrochanteric fracture of the proximal femur with involvement of the lesser trochanter, as further described above. If clinically warranted, a dedicated CT of the left hip may be of benefit for preoperative planning. Electronically Signed   By: Rogelia Myers M.D.   On: 10/08/2023 21:03   DG Chest Port 1 View Result Date: 10/08/2023 CLINICAL DATA:  trauma EXAM: PORTABLE CHEST - 1 VIEW COMPARISON:  June 25, 2023 FINDINGS: Bilateral perihilar interstitial opacities throughout both lungs. Moderate volume bilateral pleural effusions. No pneumothorax. Mild cardiomegaly. Sternotomy wires and CABG changes. Tortuous aorta with aortic atherosclerosis. No acute, displaced fracture or destructive lesions. Multilevel thoracic osteophytosis. IMPRESSION: Mild cardiomegaly findings of interstitial edema throughout both lungs. Moderate volume bilateral pleural effusions. Electronically Signed   By: Rogelia Myers M.D.   On: 10/08/2023 20:58   DG Elbow Complete Left Result Date: 10/08/2023 CLINICAL DATA:  trauma EXAM: LEFT ELBOW - COMPLETE 3+ VIEW COMPARISON:  None Available. FINDINGS: Osteopenia.No  acute, displaced fracture or dislocation. Superior elevation of the anterior fat pad. There is no evidence of arthropathy or other focal bone abnormality. Soft tissues are unremarkable. IMPRESSION: Elevated anterior fat pad suggests the presence of an elbow joint effusion. While no acute, displaced fracture is visualized, in the setting of trauma, elevation of the anterior fat pad is worrisome for underlying effusion and occult elbow fracture. Electronically Signed   By: Rogelia Myers M.D.   On: 10/08/2023 20:56   DG Shoulder Left Result Date: 10/08/2023 CLINICAL DATA:  fall, injury EXAM: LEFT SHOULDER - 2+ VIEW COMPARISON:  None Available. FINDINGS: Osteopenia.No acute fracture or dislocation. There is no evidence of arthropathy or other focal bone abnormality. Soft tissues are unremarkable. IMPRESSION: No acute fracture or dislocation. Electronically Signed   By: Rogelia Myers M.D.   On: 10/08/2023 20:54    Review of Systems  Unable to perform ROS: Patient nonverbal   Blood pressure (!) 142/61, pulse 97, temperature 97.9 F (36.6 C), resp. rate (!) 21, height 6' (1.829 m), weight 75.6 kg, SpO2 100%. Physical Exam Constitutional:      General: He is not in acute distress.    Appearance: He is well-developed. He is not diaphoretic.  HENT:  Head: Normocephalic and atraumatic.  Eyes:     General: No scleral icterus.       Right eye: No discharge.        Left eye: No discharge.     Conjunctiva/sclera: Conjunctivae normal.  Cardiovascular:     Rate and Rhythm: Normal rate and regular rhythm.  Pulmonary:     Effort: Pulmonary effort is normal. No respiratory distress.  Musculoskeletal:     Cervical back: Normal range of motion.  Skin:    General: Skin is warm and dry.  Neurological:     Mental Status: He is alert.  Psychiatric:     Comments: Non-verbal     Assessment/Plan: Left hip fx -- Plan IMN by Dr. Vernetta today if cleared by medicine, mainly 2/2 hgb. Family understands  this is mainly for palliation and there is significant risk. Left elbow effusion -- May need CT to r/o occult fx.    Ozell DOROTHA Ned, PA-C Orthopedic Surgery [phone] 10/09/2023, 10:04 AM

## 2023-10-09 NOTE — Progress Notes (Signed)
 Progress Note   Patient: [name] FMW:[id number] DOB: December 30, 1928 DOA: 10/08/2023     1 DOS: the patient was seen and examined on 10/09/2023   Brief hospital course: [name] is a 89 y.o. male with medical history significant for ischemic stroke with residual expressive aphasia, nonverbal at baseline, residual right hemiparesis, persistent atrial fibrillation chronically anticoagulated on Eliquis , anemia of chronic disease baseline hemoglobin 9-10, CKD stage IV with baseline creatinine 3.2-3.4, essential hypertension, chronic right-sided systolic heart failure, who is admitted to [facility] on 10/08/2023 with acute left hip fracture after presenting from snf to [facility] ED for evaluation of fall.   Patient sustained acute left hip fracture, laboratory workup notable for acute kidney injury, hyponatremia, acute on chronic anemia admitted to TRH service for further management evaluation, orthopedic consulted.  Assessment and Plan: Acute displaced subtrochanteric fracture of proximal left femur- Status post mechanical fall Continue pain control, supportive care. His hemoglobin 5.6, patient got a unit of blood with repeat hemoglobin 6.8. last dose Eliquis  yesterday AM. Orthopedic team discussed with family suggested to hold off on ORIF procedure at this time and to revisit tomorrow. Continue to monitor H&H closely. He is under hospice care. Family contemplating on ortho procedure given overall poor prognosis.  Acute on CKD stage IV: Continue gentle IV hydration. Avoid nephrotoxic drugs. UA unremarkable for infection. Monitor daily renal function.  Acute on chronic anemia- Patient's baseline hemoglobin ranges 9-10. Hemoglobin dropped to 5.6.  He got 1 unit of blood transfusion with repeat 6.8. Will give another unit of blood.  Monitor H&H closely. Discussed with family to stop Eliquis  upon discharge.  Hypernatremia- In the setting of poor oral intake. Continue gentle IV  fluids. Trend sodium. Monitor strict input and output.  Persistent A-fib: EKG shows Afib rate controlled. Hold home Eliquis .  Continue rate control medications as needed.  Essential hypertension: Hold oral meds as he is more sleepy. IV hydralazine  as needed for elevated blood pressures.  Hyperlipidemia: Resume statin upon discharge.  Depression: On Lexapro  PTA.  Hospice patient on [facility]- Patient from independent living facility under hospice care.  His overall medical condition deteriorating as per family since few weeks. Hospice team to follow him while inpatient. Patient may need upgraded services upon discharge.      Nursing supportive care. Fall, aspiration precautions. Diet:  Diet Orders (From admission, onward)     Start     Ordered   10/09/23 1025  Diet NPO time specified Except for: Sips with Meds, Ice Chips  Diet effective now       Question Answer Comment  Except for Sips with Meds   Except for Ice Chips      10/09/23 1025           DVT prophylaxis: SCDs Start: 10/08/23 2307  Level of care: Progressive   Code Status: Limited: Do not attempt resuscitation (DNR) -DNR-LIMITED -Do Not Intubate/DNI   Subjective: Patient is seen and examined today morning in short stay unit. Lethargic pulling lines. Family at bedside. He is unable to provide history.   Physical Exam: Vitals:   10/09/23 0956 10/09/23 0956 10/09/23 0957 10/09/23 1303  BP: (!) 142/61 (!) 142/61  91/78  Pulse: 67  97 62  Resp: (!) 22  (!) 21 19  Temp: 97.9 F (36.6 C) 97.9 F (36.6 C)    TempSrc: Oral  Oral   SpO2: 100%  100% 100%  Weight:      Height:  General - Elderly ill looking Caucasian male, confused, at times restless. HEENT - PERRLA, EOMI, atraumatic head, non tender sinuses. Lung - Clear, basal rales, rhonchi, no wheezes. Heart - S1, S2 heard, no murmurs, rubs, trace pedal edema. Abdomen - Soft, non tender, bowel sounds good Neuro - sleepy and lethargic,  unable to do full neuro exam. Skin - Warm and dry.  Data Reviewed:      Latest Ref Rng & Units 10/09/2023    1:05 PM 10/09/2023    1:00 PM 10/09/2023    4:55 AM  CBC  WBC 4.0 - 10.5 K/uL  4.1  4.5   Hemoglobin 13.0 - 17.0 g/dL 6.8  7.0  5.6   Hematocrit 39.0 - 52.0 % 20.0  21.8  18.3   Platelets 150 - 400 K/uL  92  82       Latest Ref Rng & Units 10/09/2023    1:05 PM 10/09/2023    4:55 AM 10/08/2023    8:37 PM  BMP  Glucose 70 - 99 mg/dL 888  887  887   BUN 8 - 23 mg/dL 78  90  79   Creatinine 0.61 - 1.24 mg/dL 5.19  5.46  5.49   Sodium 135 - 145 mmol/L 149  149  149   Potassium 3.5 - 5.1 mmol/L 4.4  4.3  4.2   Chloride 98 - 111 mmol/L 117  118  115   CO2 22 - 32 mmol/L  19    Calcium  8.9 - 10.3 mg/dL  7.6     CT Head Wo Contrast Result Date: 10/08/2023 CLINICAL DATA:  Recent fall with headaches and neck pain, initial encounter EXAM: CT HEAD WITHOUT CONTRAST CT CERVICAL SPINE WITHOUT CONTRAST TECHNIQUE: Multidetector CT imaging of the head and cervical spine was performed following the standard protocol without intravenous contrast. Multiplanar CT image reconstructions of the cervical spine were also generated. RADIATION DOSE REDUCTION: This exam was performed according to the departmental dose-optimization program which includes automated exposure control, adjustment of the mA and/or kV according to patient size and/or use of iterative reconstruction technique. COMPARISON:  09/17/2021 FINDINGS: CT HEAD FINDINGS Brain: There are again noted changes consistent with prior left MCA infarct with considerable encephalomalacia. Atrophic changes and chronic white matter ischemic changes are seen as well. No acute hemorrhage, acute infarction or space-occupying mass lesion is noted. Vascular: No hyperdense vessel or unexpected calcification. Skull: Normal. Negative for fracture or focal lesion. Sinuses/Orbits: No acute finding. Other: None. CT CERVICAL SPINE FINDINGS Alignment: Within normal  limits. Skull base and vertebrae: 7 cervical segments are well visualized. Vertebral body height is well maintained. Prior fusion at C6-7 is noted. Multilevel facet hypertrophic changes and osteophytic changes are seen. No acute fracture or acute facet abnormality is noted. Soft tissues and spinal canal: Surrounding soft tissue structures are within normal limits. Upper chest: Visualized lung apices show bilateral pleural effusions which appear moderate to large worse on the right than the left. Other: None IMPRESSION: CT of the head: No acute intracranial abnormality noted. Changes of prior left MCA infarct. Chronic atrophic and ischemic changes. CT of the cervical spine: Multilevel degenerative change without acute abnormality. Electronically Signed   By: Oneil Devonshire M.D.   On: 10/08/2023 22:35   CT Cervical Spine Wo Contrast Result Date: 10/08/2023 CLINICAL DATA:  Recent fall with headaches and neck pain, initial encounter EXAM: CT HEAD WITHOUT CONTRAST CT CERVICAL SPINE WITHOUT CONTRAST TECHNIQUE: Multidetector CT imaging of the head and cervical spine was  performed following the standard protocol without intravenous contrast. Multiplanar CT image reconstructions of the cervical spine were also generated. RADIATION DOSE REDUCTION: This exam was performed according to the departmental dose-optimization program which includes automated exposure control, adjustment of the mA and/or kV according to patient size and/or use of iterative reconstruction technique. COMPARISON:  09/17/2021 FINDINGS: CT HEAD FINDINGS Brain: There are again noted changes consistent with prior left MCA infarct with considerable encephalomalacia. Atrophic changes and chronic white matter ischemic changes are seen as well. No acute hemorrhage, acute infarction or space-occupying mass lesion is noted. Vascular: No hyperdense vessel or unexpected calcification. Skull: Normal. Negative for fracture or focal lesion. Sinuses/Orbits: No acute  finding. Other: None. CT CERVICAL SPINE FINDINGS Alignment: Within normal limits. Skull base and vertebrae: 7 cervical segments are well visualized. Vertebral body height is well maintained. Prior fusion at C6-7 is noted. Multilevel facet hypertrophic changes and osteophytic changes are seen. No acute fracture or acute facet abnormality is noted. Soft tissues and spinal canal: Surrounding soft tissue structures are within normal limits. Upper chest: Visualized lung apices show bilateral pleural effusions which appear moderate to large worse on the right than the left. Other: None IMPRESSION: CT of the head: No acute intracranial abnormality noted. Changes of prior left MCA infarct. Chronic atrophic and ischemic changes. CT of the cervical spine: Multilevel degenerative change without acute abnormality. Electronically Signed   By: Oneil Devonshire M.D.   On: 10/08/2023 22:35   DG Hip Unilat W or Wo Pelvis 2-3 Views Left Result Date: 10/08/2023 CLINICAL DATA:  trauma EXAM: DG HIP (WITH OR WITHOUT PELVIS) 2-3V LEFT COMPARISON:  September 17, 2021 FINDINGS: Osteopenia.Obliquely oriented, comminuted, and displaced subtrochanteric fracture of the proximal femur.The femoral shaft is posteromedially displaced at least 1 shaft width. The fracture line extends into the lesser trochanter, which is medially displaced 1.6 cm.No dislocation of the hip. Lumbar degenerative disc disease. Extensive aortoiliac and peripheral vascular atherosclerosis. IMPRESSION: Obliquely oriented, displaced subtrochanteric fracture of the proximal femur with involvement of the lesser trochanter, as further described above. If clinically warranted, a dedicated CT of the left hip may be of benefit for preoperative planning. Electronically Signed   By: Rogelia Myers M.D.   On: 10/08/2023 21:03   DG Chest Port 1 View Result Date: 10/08/2023 CLINICAL DATA:  trauma EXAM: PORTABLE CHEST - 1 VIEW COMPARISON:  June 25, 2023 FINDINGS: Bilateral perihilar  interstitial opacities throughout both lungs. Moderate volume bilateral pleural effusions. No pneumothorax. Mild cardiomegaly. Sternotomy wires and CABG changes. Tortuous aorta with aortic atherosclerosis. No acute, displaced fracture or destructive lesions. Multilevel thoracic osteophytosis. IMPRESSION: Mild cardiomegaly findings of interstitial edema throughout both lungs. Moderate volume bilateral pleural effusions. Electronically Signed   By: Rogelia Myers M.D.   On: 10/08/2023 20:58   DG Elbow Complete Left Result Date: 10/08/2023 CLINICAL DATA:  trauma EXAM: LEFT ELBOW - COMPLETE 3+ VIEW COMPARISON:  None Available. FINDINGS: Osteopenia.No acute, displaced fracture or dislocation. Superior elevation of the anterior fat pad. There is no evidence of arthropathy or other focal bone abnormality. Soft tissues are unremarkable. IMPRESSION: Elevated anterior fat pad suggests the presence of an elbow joint effusion. While no acute, displaced fracture is visualized, in the setting of trauma, elevation of the anterior fat pad is worrisome for underlying effusion and occult elbow fracture. Electronically Signed   By: Rogelia Myers M.D.   On: 10/08/2023 20:56   DG Shoulder Left Result Date: 10/08/2023 CLINICAL DATA:  fall, injury EXAM: LEFT SHOULDER -  2+ VIEW COMPARISON:  None Available. FINDINGS: Osteopenia.No acute fracture or dislocation. There is no evidence of arthropathy or other focal bone abnormality. Soft tissues are unremarkable. IMPRESSION: No acute fracture or dislocation. Electronically Signed   By: Rogelia Myers M.D.   On: 10/08/2023 20:54    Family Communication: Discussed with patient's son, daughter at bedside, they understand and agree. All questions answered. He may qualify for inpatient hospice pending ortho intervention.  Disposition: Status is: Inpatient Remains inpatient appropriate because: s/p fall, hip fracture, pain control, supportive care.  Planned Discharge Destination:  Skilled nursing facility     Time spent: 48 minutes  Author: Concepcion Riser, MD 10/09/2023 2:19 PM Secure chat 7am to 7pm For on call review [email or web address].

## 2023-10-09 NOTE — Progress Notes (Signed)
 Pt arrived to 6 north room 8 alert. Pt is nonverbal. Bed in lowest position. Call light in reach. Bed alarm on.

## 2023-10-09 NOTE — Progress Notes (Signed)
 TRH night cross cover note:   I was notified by the patient's RN  of hgb of 5.6 this AM.   The patient's daughter is not present at bedside, and able to consent for transfusion of PRBC.   I subsequently placed orders for transfusion of 1 unit PRBC over 3 hours, as well as an order for repeat hemoglobin  to be checked following completion of transfusion of 1 unit PRBC.     Eva Pore, DO Hospitalist

## 2023-10-09 NOTE — Progress Notes (Signed)
 [name], male   DOB: 1929-01-23, 89 y.o.   MRN: [id number] I spoke to several family members at the bedside in length about the patient's situation as a relates to his complex left proximal femur fracture.  His hemoglobin only came up to 6.8 after unit of blood.  He did have Eliquis  yesterday.  His platelets were 80,000.  He is someone that it would best to try to medically optimize today prior to proceeding with surgery that we will certainly have blood loss associated with the nature of the case.  The family agrees with this and have also talked directly with anesthesia who did voiced their concerns as well as recommendations to the family.  They stated that this would give them time as a family to decide what is best for the patient.  He is someone who has gotten progressively weak over the last several weeks and barely mobilizes.  He likes to take care of himself but has gotten to the point where he cannot do that.  He has been on hospice care and palliative care may be an option.  The surgery would obviously be for pain control and palliative purposes as well.  There is potential for surgery tomorrow by one of my orthopedic colleagues if there is some availability.

## 2023-10-09 NOTE — Progress Notes (Signed)
 Transition of Care [facility]) - CAGE-AID Screening   Patient Details  Name: [name] MRN: [id number] Date of Birth: November 29, 1928  [facility]) CM/SW Contact:    Bernardino Mayotte, RN Phone Number: 10/09/2023, 6:10 AM   Clinical Narrative:  Patient endorses alcohol  use, denies illicit drugs. Resources not given at this time.  CAGE-AID Screening:    Have You Ever Felt You Ought to Cut Down on Your Drinking or Drug Use?: No Have People Annoyed You By Critizing Your Drinking Or Drug Use?: No Have You Felt Bad Or Guilty About Your Drinking Or Drug Use?: No Have You Ever Had a Drink or Used Drugs First Thing In The Morning to Steady Your Nerves or to Get Rid of a Hangover?: No CAGE-AID Score: 0  Substance Abuse Education Offered: No

## 2023-10-09 NOTE — ED Notes (Signed)
 Called [facility] and spoke to triage nurse Megan to advise patient will be having surgery at 735am.

## 2023-10-09 NOTE — Progress Notes (Signed)
 Blood ended at 1620. H&H placed for 2020

## 2023-10-10 ENCOUNTER — Inpatient Hospital Stay (HOSPITAL_COMMUNITY)

## 2023-10-10 ENCOUNTER — Other Ambulatory Visit: Payer: Self-pay

## 2023-10-10 ENCOUNTER — Inpatient Hospital Stay (HOSPITAL_COMMUNITY): Payer: Self-pay

## 2023-10-10 ENCOUNTER — Encounter (HOSPITAL_COMMUNITY): Admission: EM | Disposition: E | Payer: Self-pay | Source: Skilled Nursing Facility | Attending: Internal Medicine

## 2023-10-10 DIAGNOSIS — I130 Hypertensive heart and chronic kidney disease with heart failure and stage 1 through stage 4 chronic kidney disease, or unspecified chronic kidney disease: Secondary | ICD-10-CM

## 2023-10-10 DIAGNOSIS — I5033 Acute on chronic diastolic (congestive) heart failure: Secondary | ICD-10-CM | POA: Diagnosis not present

## 2023-10-10 DIAGNOSIS — S72002A Fracture of unspecified part of neck of left femur, initial encounter for closed fracture: Secondary | ICD-10-CM | POA: Diagnosis not present

## 2023-10-10 DIAGNOSIS — W19XXXA Unspecified fall, initial encounter: Secondary | ICD-10-CM | POA: Diagnosis not present

## 2023-10-10 DIAGNOSIS — E870 Hyperosmolality and hypernatremia: Secondary | ICD-10-CM | POA: Diagnosis not present

## 2023-10-10 DIAGNOSIS — N184 Chronic kidney disease, stage 4 (severe): Secondary | ICD-10-CM

## 2023-10-10 DIAGNOSIS — Z8679 Personal history of other diseases of the circulatory system: Secondary | ICD-10-CM | POA: Diagnosis not present

## 2023-10-10 DIAGNOSIS — S72142A Displaced intertrochanteric fracture of left femur, initial encounter for closed fracture: Secondary | ICD-10-CM | POA: Diagnosis not present

## 2023-10-10 HISTORY — PX: INTRAMEDULLARY (IM) NAIL INTERTROCHANTERIC: SHX5875

## 2023-10-10 LAB — POCT I-STAT, CHEM 8
BUN: 79 mg/dL — ABNORMAL HIGH (ref 8–23)
BUN: 96 mg/dL — ABNORMAL HIGH (ref 8–23)
Calcium, Ion: 0.96 mmol/L — ABNORMAL LOW (ref 1.15–1.40)
Calcium, Ion: 1.02 mmol/L — ABNORMAL LOW (ref 1.15–1.40)
Chloride: 117 mmol/L — ABNORMAL HIGH (ref 98–111)
Chloride: 119 mmol/L — ABNORMAL HIGH (ref 98–111)
Creatinine, Ser: 4.3 mg/dL — ABNORMAL HIGH (ref 0.61–1.24)
Creatinine, Ser: 4.4 mg/dL — ABNORMAL HIGH (ref 0.61–1.24)
Glucose, Bld: 110 mg/dL — ABNORMAL HIGH (ref 70–99)
Glucose, Bld: 111 mg/dL — ABNORMAL HIGH (ref 70–99)
HCT: 20 % — ABNORMAL LOW (ref 39.0–52.0)
HCT: 22 % — ABNORMAL LOW (ref 39.0–52.0)
Hemoglobin: 6.8 g/dL — CL (ref 13.0–17.0)
Hemoglobin: 7.5 g/dL — ABNORMAL LOW (ref 13.0–17.0)
Potassium: 4.3 mmol/L (ref 3.5–5.1)
Potassium: 5.2 mmol/L — ABNORMAL HIGH (ref 3.5–5.1)
Sodium: 150 mmol/L — ABNORMAL HIGH (ref 135–145)
Sodium: 152 mmol/L — ABNORMAL HIGH (ref 135–145)
TCO2: 19 mmol/L — ABNORMAL LOW (ref 22–32)
TCO2: 20 mmol/L — ABNORMAL LOW (ref 22–32)

## 2023-10-10 LAB — BASIC METABOLIC PANEL WITH GFR
Anion gap: 11 (ref 5–15)
BUN: 86 mg/dL — ABNORMAL HIGH (ref 8–23)
CO2: 18 mmol/L — ABNORMAL LOW (ref 22–32)
Calcium: 7.5 mg/dL — ABNORMAL LOW (ref 8.9–10.3)
Chloride: 121 mmol/L — ABNORMAL HIGH (ref 98–111)
Creatinine, Ser: 4.35 mg/dL — ABNORMAL HIGH (ref 0.61–1.24)
GFR, Estimated: 12 mL/min — ABNORMAL LOW (ref >60)
Glucose, Bld: 117 mg/dL — ABNORMAL HIGH (ref 70–99)
Potassium: 4.3 mmol/L (ref 3.5–5.1)
Sodium: 150 mmol/L — ABNORMAL HIGH (ref 135–145)

## 2023-10-10 LAB — OSMOLALITY: Osmolality: 338 mosm/kg (ref 275–295)

## 2023-10-10 LAB — CBC
HCT: 23.1 % — ABNORMAL LOW (ref 39.0–52.0)
Hemoglobin: 7.7 g/dL — ABNORMAL LOW (ref 13.0–17.0)
MCH: 33.3 pg (ref 26.0–34.0)
MCHC: 33.3 g/dL (ref 30.0–36.0)
MCV: 100 fL (ref 80.0–100.0)
Platelets: 102 K/uL — ABNORMAL LOW (ref 150–400)
RBC: 2.31 MIL/uL — ABNORMAL LOW (ref 4.22–5.81)
RDW: 19 % — ABNORMAL HIGH (ref 11.5–15.5)
WBC: 6.5 K/uL (ref 4.0–10.5)
nRBC: 0 % (ref 0.0–0.2)

## 2023-10-10 LAB — PREPARE RBC (CROSSMATCH)

## 2023-10-10 SURGERY — FIXATION, FRACTURE, INTERTROCHANTERIC, WITH INTRAMEDULLARY ROD
Anesthesia: General | Site: Leg Upper | Laterality: Left

## 2023-10-10 MED ORDER — CEFAZOLIN SODIUM-DEXTROSE 2-3 GM-%(50ML) IV SOLR
INTRAVENOUS | Status: DC | PRN
  Administered 2023-10-10 (×2): 2 g via INTRAVENOUS

## 2023-10-10 MED ORDER — CHLORHEXIDINE GLUCONATE 0.12 % MT SOLN
OROMUCOSAL | Status: AC
  Filled 2023-10-10: qty 15

## 2023-10-10 MED ORDER — ROCURONIUM BROMIDE 10 MG/ML (PF) SYRINGE
PREFILLED_SYRINGE | INTRAVENOUS | Status: AC
Start: 2023-10-10 — End: 2023-10-10
  Filled 2023-10-10: qty 10

## 2023-10-10 MED ORDER — VANCOMYCIN HCL 1000 MG IV SOLR
INTRAVENOUS | Status: AC
  Filled 2023-10-10: qty 20

## 2023-10-10 MED ORDER — STERILE WATER FOR IRRIGATION IR SOLN
Status: DC | PRN
  Administered 2023-10-10 (×2): 1000 mL

## 2023-10-10 MED ORDER — TRANEXAMIC ACID-NACL 1000-0.7 MG/100ML-% IV SOLN
INTRAVENOUS | Status: DC | PRN
  Administered 2023-10-10 (×2): 1000 mg via INTRAVENOUS

## 2023-10-10 MED ORDER — CHLORHEXIDINE GLUCONATE 0.12 % MT SOLN: 15.0000 mL | Freq: Once | OROMUCOSAL | Status: DC

## 2023-10-10 MED ORDER — PHENYLEPHRINE HCL-NACL 20-0.9 MG/250ML-% IV SOLN
INTRAVENOUS | Status: DC | PRN
  Administered 2023-10-10 (×2): 15 ug/min via INTRAVENOUS

## 2023-10-10 MED ORDER — SUGAMMADEX SODIUM 200 MG/2ML IV SOLN
INTRAVENOUS | Status: DC | PRN
  Administered 2023-10-10 (×2): 150 mg via INTRAVENOUS

## 2023-10-10 MED ORDER — FENTANYL CITRATE (PF) 100 MCG/2ML IJ SOLN: 25.0000 ug | INTRAMUSCULAR | Status: DC | PRN

## 2023-10-10 MED ORDER — AMISULPRIDE (ANTIEMETIC) 5 MG/2ML IV SOLN: 10.0000 mg | Freq: Once | INTRAVENOUS | Status: DC | PRN

## 2023-10-10 MED ORDER — METOCLOPRAMIDE HCL 5 MG PO TABS: 5.0000 mg | ORAL_TABLET | Freq: Three times a day (TID) | ORAL | Status: DC | PRN

## 2023-10-10 MED ORDER — CEFAZOLIN SODIUM-DEXTROSE 2-4 GM/100ML-% IV SOLN: 2.0000 g | Freq: Three times a day (TID) | INTRAVENOUS | Status: DC

## 2023-10-10 MED ORDER — METOCLOPRAMIDE HCL 5 MG/ML IJ SOLN: 5.0000 mg | Freq: Three times a day (TID) | INTRAMUSCULAR | Status: DC | PRN

## 2023-10-10 MED ORDER — FENTANYL CITRATE (PF) 250 MCG/5ML IJ SOLN
INTRAMUSCULAR | Status: DC | PRN
  Administered 2023-10-10 (×4): 50 ug via INTRAVENOUS

## 2023-10-10 MED ORDER — SODIUM CHLORIDE 0.45 % IV SOLN: INTRAVENOUS | Status: AC

## 2023-10-10 MED ORDER — MUPIROCIN 2 % EX OINT: 1.0000 | TOPICAL_OINTMENT | Freq: Two times a day (BID) | CUTANEOUS | 0 refills | Status: DC

## 2023-10-10 MED ORDER — OXYCODONE HCL 5 MG PO TABS: 5.0000 mg | ORAL_TABLET | Freq: Once | ORAL | Status: DC | PRN

## 2023-10-10 MED ORDER — DOCUSATE SODIUM 100 MG PO CAPS
100.0000 mg | ORAL_CAPSULE | Freq: Two times a day (BID) | ORAL | Status: DC
  Administered 2023-10-12 – 2023-10-15 (×5): 100 mg via ORAL
  Filled 2023-10-10 (×8): qty 1

## 2023-10-10 MED ORDER — LACTATED RINGERS IV SOLN: INTRAVENOUS | Status: DC

## 2023-10-10 MED ORDER — CEFAZOLIN SODIUM-DEXTROSE 2-4 GM/100ML-% IV SOLN
2.0000 g | Freq: Two times a day (BID) | INTRAVENOUS | Status: AC
  Administered 2023-10-11 (×2): 2 g via INTRAVENOUS
  Filled 2023-10-10 (×2): qty 100

## 2023-10-10 MED ORDER — ROCURONIUM BROMIDE 10 MG/ML (PF) SYRINGE
PREFILLED_SYRINGE | INTRAVENOUS | Status: DC | PRN
  Administered 2023-10-10 (×2): 50 mg via INTRAVENOUS

## 2023-10-10 MED ORDER — FENTANYL CITRATE (PF) 250 MCG/5ML IJ SOLN
INTRAMUSCULAR | Status: AC
  Filled 2023-10-10: qty 5

## 2023-10-10 MED ORDER — CHLORHEXIDINE GLUCONATE CLOTH 2 % EX PADS
6.0000 | MEDICATED_PAD | Freq: Every day | CUTANEOUS | Status: DC
  Administered 2023-10-10 (×2): 6 via TOPICAL

## 2023-10-10 MED ORDER — CEFAZOLIN SODIUM-DEXTROSE 2-4 GM/100ML-% IV SOLN
INTRAVENOUS | Status: AC
Start: 2023-10-10 — End: 2023-10-10
  Filled 2023-10-10: qty 100

## 2023-10-10 MED ORDER — ONDANSETRON HCL 4 MG/2ML IJ SOLN
INTRAMUSCULAR | Status: DC | PRN
  Administered 2023-10-10 (×2): 4 mg via INTRAVENOUS

## 2023-10-10 MED ORDER — PHENOL 1.4 % MT LIQD: 1.0000 | OROMUCOSAL | Status: DC | PRN

## 2023-10-10 MED ORDER — MORPHINE SULFATE (PF) 2 MG/ML IV SOLN
0.5000 mg | INTRAVENOUS | Status: DC | PRN
  Administered 2023-10-13: 0.5 mg via INTRAVENOUS
  Filled 2023-10-10: qty 1

## 2023-10-10 MED ORDER — ACETAMINOPHEN 500 MG PO TABS: 1000.0000 mg | ORAL_TABLET | Freq: Once | ORAL | Status: DC

## 2023-10-10 MED ORDER — SODIUM CHLORIDE 0.9 % IV SOLN: INTRAVENOUS | Status: DC | PRN

## 2023-10-10 MED ORDER — LIDOCAINE 2% (20 MG/ML) 5 ML SYRINGE
INTRAMUSCULAR | Status: AC
  Filled 2023-10-10: qty 5

## 2023-10-10 MED ORDER — OXYCODONE HCL 5 MG/5ML PO SOLN: 5.0000 mg | Freq: Once | ORAL | Status: DC | PRN

## 2023-10-10 MED ORDER — PROPOFOL 10 MG/ML IV BOLUS
INTRAVENOUS | Status: DC | PRN
  Administered 2023-10-10 (×2): 30 mg via INTRAVENOUS

## 2023-10-10 MED ORDER — LIDOCAINE 2% (20 MG/ML) 5 ML SYRINGE
INTRAMUSCULAR | Status: DC | PRN
  Administered 2023-10-10 (×2): 100 mg via INTRAVENOUS

## 2023-10-10 MED ORDER — SODIUM CHLORIDE 0.9% IV SOLUTION: Freq: Once | INTRAVENOUS | Status: DC

## 2023-10-10 MED ORDER — ORAL CARE MOUTH RINSE: 15.0000 mL | Freq: Once | OROMUCOSAL | Status: DC

## 2023-10-10 MED ORDER — MUPIROCIN 2 % EX OINT
1.0000 | TOPICAL_OINTMENT | Freq: Two times a day (BID) | CUTANEOUS | Status: AC
  Administered 2023-10-10 – 2023-10-14 (×12): 1 via NASAL
  Filled 2023-10-10: qty 22

## 2023-10-10 MED ORDER — TRAMADOL HCL 50 MG PO TABS
50.0000 mg | ORAL_TABLET | Freq: Four times a day (QID) | ORAL | Status: DC | PRN
  Administered 2023-10-11 – 2023-10-15 (×4): 50 mg via ORAL
  Filled 2023-10-10 (×4): qty 1

## 2023-10-10 MED ORDER — VASOPRESSIN 20 UNIT/ML IV SOLN
INTRAVENOUS | Status: DC | PRN
  Administered 2023-10-10 (×4): 1 [IU] via INTRAVENOUS
  Administered 2023-10-10: 2 [IU] via INTRAVENOUS
  Administered 2023-10-10: 1 [IU] via INTRAVENOUS
  Administered 2023-10-10: 2 [IU] via INTRAVENOUS
  Administered 2023-10-10 (×2): 1 [IU] via INTRAVENOUS
  Administered 2023-10-10: 2 [IU] via INTRAVENOUS
  Administered 2023-10-10 (×2): 1 [IU] via INTRAVENOUS
  Administered 2023-10-10: 2 [IU] via INTRAVENOUS
  Administered 2023-10-10: 1 [IU] via INTRAVENOUS
  Administered 2023-10-10: 2 [IU] via INTRAVENOUS
  Administered 2023-10-10 (×3): 1 [IU] via INTRAVENOUS
  Administered 2023-10-10: 2 [IU] via INTRAVENOUS
  Administered 2023-10-10: 1 [IU] via INTRAVENOUS
  Administered 2023-10-10: 2 [IU] via INTRAVENOUS
  Administered 2023-10-10: 1 [IU] via INTRAVENOUS
  Administered 2023-10-10: 2 [IU] via INTRAVENOUS
  Administered 2023-10-10: 1 [IU] via INTRAVENOUS

## 2023-10-10 MED ORDER — 0.9 % SODIUM CHLORIDE (POUR BTL) OPTIME
TOPICAL | Status: DC | PRN
  Administered 2023-10-10 (×2): 1000 mL

## 2023-10-10 MED ORDER — TRANEXAMIC ACID-NACL 1000-0.7 MG/100ML-% IV SOLN
1000.0000 mg | Freq: Once | INTRAVENOUS | Status: AC
  Administered 2023-10-10 (×2): 1000 mg via INTRAVENOUS
  Filled 2023-10-10 (×2): qty 100

## 2023-10-10 MED ORDER — MENTHOL 3 MG MT LOZG: 1.0000 | LOZENGE | OROMUCOSAL | Status: DC | PRN

## 2023-10-10 MED ORDER — TRANEXAMIC ACID-NACL 1000-0.7 MG/100ML-% IV SOLN
INTRAVENOUS | Status: AC
  Filled 2023-10-10: qty 100

## 2023-10-10 MED ORDER — VASOPRESSIN 20 UNIT/ML IV SOLN
INTRAVENOUS | Status: AC
  Filled 2023-10-10: qty 1

## 2023-10-10 MED ORDER — PHENYLEPHRINE 80 MCG/ML (10ML) SYRINGE FOR IV PUSH (FOR BLOOD PRESSURE SUPPORT)
PREFILLED_SYRINGE | INTRAVENOUS | Status: AC
  Filled 2023-10-10: qty 10

## 2023-10-10 MED ORDER — ACETAMINOPHEN 500 MG PO TABS
ORAL_TABLET | ORAL | Status: AC
  Filled 2023-10-10: qty 2

## 2023-10-10 MED ORDER — CHLORHEXIDINE GLUCONATE 4 % EX SOLN: 1.0000 | CUTANEOUS | 1 refills | Status: DC

## 2023-10-10 SURGICAL SUPPLY — 39 items
BAG COUNTER SPONGE SURGICOUNT (BAG) IMPLANT
BIT DRILL INTERTAN LAG SCREW (BIT) IMPLANT
BIT DRILL SHORT 4.0 (BIT) IMPLANT
BRUSH SCRUB EZ PLAIN DRY (MISCELLANEOUS) ×2 IMPLANT
CHLORAPREP W/TINT 26 (MISCELLANEOUS) ×1 IMPLANT
COVER PERINEAL POST (MISCELLANEOUS) ×1 IMPLANT
COVER SURGICAL LIGHT HANDLE (MISCELLANEOUS) ×1 IMPLANT
DERMABOND ADVANCED .7 DNX12 (GAUZE/BANDAGES/DRESSINGS) ×1 IMPLANT
DRAPE C-ARM 35X43 STRL (DRAPES) ×1 IMPLANT
DRAPE IMP U-DRAPE 54X76 (DRAPES) ×2 IMPLANT
DRAPE INCISE IOBAN 66X45 STRL (DRAPES) ×1 IMPLANT
DRAPE STERI IOBAN 125X83 (DRAPES) ×1 IMPLANT
DRAPE SURG 17X23 STRL (DRAPES) ×2 IMPLANT
DRAPE U-SHAPE 47X51 STRL (DRAPES) ×1 IMPLANT
DRESSING MEPILEX FLEX 4X4 (GAUZE/BANDAGES/DRESSINGS) ×1 IMPLANT
DRSG MEPILEX POST OP 4X8 (GAUZE/BANDAGES/DRESSINGS) ×1 IMPLANT
ELECTRODE REM PT RTRN 9FT ADLT (ELECTROSURGICAL) ×1 IMPLANT
GLOVE BIO SURGEON STRL SZ 6.5 (GLOVE) ×3 IMPLANT
GLOVE BIO SURGEON STRL SZ7.5 (GLOVE) ×4 IMPLANT
GLOVE BIOGEL PI IND STRL 6.5 (GLOVE) ×1 IMPLANT
GLOVE BIOGEL PI IND STRL 7.5 (GLOVE) ×1 IMPLANT
GOWN STRL REUS W/ TWL LRG LVL3 (GOWN DISPOSABLE) ×1 IMPLANT
KIT BASIN OR (CUSTOM PROCEDURE TRAY) ×1 IMPLANT
KIT TURNOVER KIT B (KITS) ×1 IMPLANT
MANIFOLD NEPTUNE II (INSTRUMENTS) ×1 IMPLANT
NAIL LOCK CANN 10X380 130D LT (Nail) IMPLANT
NS IRRIG 1000ML POUR BTL (IV SOLUTION) ×1 IMPLANT
PACK GENERAL/GYN (CUSTOM PROCEDURE TRAY) ×1 IMPLANT
PAD ARMBOARD POSITIONER FOAM (MISCELLANEOUS) ×2 IMPLANT
PIN GUIDE 3.2X343MM (PIN) IMPLANT
ROD GUIDE 3.0 (MISCELLANEOUS) IMPLANT
SCREW LAG COMPR KIT 105/100 (Screw) IMPLANT
SCREW TRIGEN LOW PROF 5.0X40 (Screw) IMPLANT
SUT MNCRL AB 3-0 PS2 18 (SUTURE) ×1 IMPLANT
SUT MON AB 2-0 CT1 36 (SUTURE) IMPLANT
SUT VIC AB 0 CT1 27XBRD ANBCTR (SUTURE) IMPLANT
SUT VIC AB 2-0 CT1 TAPERPNT 27 (SUTURE) ×2 IMPLANT
TOWEL GREEN STERILE (TOWEL DISPOSABLE) ×2 IMPLANT
WATER STERILE IRR 1000ML POUR (IV SOLUTION) ×1 IMPLANT

## 2023-10-10 NOTE — Consult Note (Signed)
 Orthopaedic Trauma Service (OTS) Consult   [name] MRN: [id number] DOB/AGE: [birth year] 89 y.o.  Reason for Consult:Left hip fracture Referring Physician: Dr. Lonni Poli, MD [place]  HPI: [name] is an 89 y.o. male who is being seen in consultation at the request of Dr. Poli for evaluation of the left hip fracture.  Patient has multiple medical comorbidities and he had a fall at home.  He sustained a left proximal femur fracture.  He had significant drop in his hemoglobin.  He was not medically optimized yesterday when Dr. Poli was planning to take him for surgery as result he asked if I could help manage today due to OR availability.  After further discussion with the patient's family they wish to pursue operative intervention due to the palliative nature of it and to take away his pain and discomfort from the fracture.  I met with the patient and his family at bedside today.  Currently the patient is not able to verbalize and is not awakening or makes eye contact.  His daughter and son are at bedside.  Past Medical History:  Diagnosis Date   Actinic keratosis    BPH with urinary obstruction    Bradycardia    CAD (coronary artery disease)    CABG x5   Cerebrovascular accident (stroke) (HCC)    With right hemiparesis and persistent expressive aphasia   GERD (gastroesophageal reflux disease)    History of hydronephrosis    Hx of hydronephrosis 07/08/2011   right    Hyperlipidemia    Hypertension    Iron deficiency anemia due to chronic blood loss 03/26/2007   Now resolved     Iron deficiency anemia secondary to blood loss (chronic)    Persistent atrial fibrillation (HCC)    Pulmonary hypertension (HCC)    by echo 2013   Rosacea    Situational depression    Severe   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   treated after biopsy   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 He has never used smokeless tobacco. He reports current alcohol use of about 1.0 standard drink of alcohol per week. He reports that he does not use drugs.  Allergies:  Allergies  Allergen Reactions   Ciprofloxacin Hcl Other (See Comments)    Reaction unknown by family   Sulfamethoxazole-Trimethoprim Other (See Comments)    Reaction unknown by family    Medications:  No current facility-administered medications on file prior to encounter.   Current Outpatient Medications on File Prior to Encounter  Medication Sig Dispense Refill   amLODipine  (NORVASC ) 5 MG tablet TAKE 1 TABLET  BY MOUTH ONCE DAILY 90 tablet 3   apixaban  (ELIQUIS ) 2.5 MG TABS tablet Take 1 tablet (2.5 mg total) by mouth 2 (two) times daily. 60 tablet 3   doxazosin  (CARDURA ) 4 MG tablet Take 1 tablet (4 mg total) by mouth daily. 90 tablet 3   escitalopram  (LEXAPRO ) 20 MG tablet Take 1 tablet (20 mg total) by mouth daily. 30 tablet 11   furosemide  (LASIX ) 40 MG tablet Take 1 tablet (40 mg total) by mouth 2 (two) times daily. 180 tablet 3   memantine  (NAMENDA ) 10 MG tablet Take 1 tablet (10 mg total) by mouth daily. 90 tablet 3   OXYGEN Inhale 2 L into the lungs continuous.     rosuvastatin  (CRESTOR ) 20 MG tablet TAKE 1 TABLET BY MOUTH ONCE DAILY (Patient taking differently: Take 20 mg by mouth at bedtime.) 90 tablet 3     ROS: Constitutional: No fever or chills Vision: No changes in vision ENT: No  difficulty swallowing CV: No chest pain Pulm: No SOB or wheezing GI: No nausea or vomiting GU: No urgency or inability to hold urine Skin: No poor wound healing Neurologic: No numbness or tingling Psychiatric: No depression or anxiety Heme: No bruising Allergic: No reaction to medications or food   Exam: Blood pressure (!) 116/49, pulse 76, temperature 97.6 F (36.4 C), temperature source Oral, resp. rate 16, height 6' (1.829 m), weight 75.6 kg, SpO2 100%. General: No acute distress Orientation: Not awake or alert oriented Mood and Affect: Unable to fully assess Gait: Unable to assess Coordination and balance: Unable to assess  Left lower extremity: Leg is swollen compartments are soft compressible.  Not following commands to establish neuro exam.  Warm well-perfused foot.  Medical Decision Making: Data: Imaging: X-rays reviewed which shows a proximal femur fracture with significant displacement.  Labs:  Results for orders placed or performed during the hospital encounter of 10/08/23 (from the past 24 hours)  Hemoglobin and hematocrit, blood     Status: Abnormal   Collection Time: 10/09/23  8:22 PM  Result Value Ref Range   Hemoglobin 8.7 (L) 13.0 - 17.0 g/dL   HCT 72.9 (L) 60.9 - 47.9 %  CBC     Status: Abnormal   Collection Time: 10/10/23  9:26 AM  Result Value Ref Range   WBC 6.5 4.0 - 10.5 K/uL   RBC 2.31 (L) 4.22 - 5.81 MIL/uL   Hemoglobin 7.7 (L) 13.0 - 17.0 g/dL   HCT 76.8 (L) 60.9 - 47.9 %   MCV 100.0 80.0 - 100.0 fL   MCH 33.3 26.0 - 34.0 pg   MCHC 33.3 30.0 - 36.0 g/dL   RDW 80.9 (H) 88.4 - 84.4 %   Platelets 102 (L) 150 - 400 K/uL   nRBC 0.0 0.0 - 0.2 %  Basic metabolic panel with GFR     Status: Abnormal   Collection Time: 10/10/23  9:26 AM  Result Value Ref Range   Sodium 150 (H) 135 - 145 mmol/L   Potassium 4.3 3.5 - 5.1 mmol/L   Chloride 121 (H) 98 - 111 mmol/L   CO2 18 (L) 22 - 32 mmol/L   Glucose, Bld 117 (H) 70 - 99 mg/dL   BUN 86 (H) 8 - 23  mg/dL   Creatinine, Ser 5.64 (H) 0.61 - 1.24 mg/dL   Calcium  7.5 (L) 8.9 - 10.3 mg/dL   GFR, Estimated 12 (L) >60 mL/min   Anion gap 11 5 - 15  Blood transfusion report - scanned  Status: None ()   Collection Time: 10/10/23 11:36 AM   Narrative   Ordered by an unspecified provider.  Blood transfusion report - scanned     Status: None   Collection Time: 10/10/23 11:36 AM   Narrative   Ordered by an unspecified provider.  Prepare RBC (crossmatch) INTRAOP ONLY     Status: None   Collection Time: 10/10/23  1:30 PM  Result Value Ref Range   Order Confirmation      ORDER PROCESSED BY BLOOD BANK Performed at [facility], [address], [place], [place] [address]   I-STAT, [place]     Status: Abnormal   Collection Time: 10/10/23  2:13 PM  Result Value Ref Range   Sodium 150 (H) 135 - 145 mmol/L   Potassium 5.2 (H) 3.5 - 5.1 mmol/L   Chloride 119 (H) 98 - 111 mmol/L   BUN 96 (H) 8 - 23 mg/dL   Creatinine, Ser 5.59 (H) 0.61 - 1.24 mg/dL   Glucose, Bld 889 (H) 70 - 99 mg/dL   Calcium , Ion 0.96 (L) 1.15 - 1.40 mmol/L   TCO2 19 (L) 22 - 32 mmol/L   Hemoglobin 6.8 (LL) 13.0 - 17.0 g/dL   HCT 79.9 (L) 60.9 - 47.9 %   Comment NOTIFIED PHYSICIAN   I-STAT, chem 8     Status: Abnormal   Collection Time: 10/10/23  2:19 PM  Result Value Ref Range   Sodium 152 (H) 135 - 145 mmol/L   Potassium 4.3 3.5 - 5.1 mmol/L   Chloride 117 (H) 98 - 111 mmol/L   BUN 79 (H) 8 - 23 mg/dL   Creatinine, Ser 5.69 (H) 0.61 - 1.24 mg/dL   Glucose, Bld 888 (H) 70 - 99 mg/dL   Calcium , Ion 1.02 (L) 1.15 - 1.40 mmol/L   TCO2 20 (L) 22 - 32 mmol/L   Hemoglobin 7.5 (L) 13.0 - 17.0 g/dL   HCT 77.9 (L) 60.9 - 47.9 %     Imaging or Labs ordered: None  Medical history and chart was reviewed and case discussed with medical provider.  Assessment/Plan: 89 year old male with multiple comorbidities with a left proximal femur fracture.  Extensive discussion was had with the patient's family.  Risks and  benefits of proceeding with surgical intervention were discussed.  Even in a patient with dementia and hospice as this patient is fracture is very painful and this surgery is a palliative measure.  I discussed with them that there is a significant risk of further bleeding with not fixing it and there is a risk of bleeding with surgery as well.  However I do feel that utilization would provide the discomfort for this patient.  After full discussion they wish to proceed to proceed today.  He is going to get another unit of blood prior to surgery.  Franky MYRTIS Light, MD [facility] [phone] (office) [email or web address]

## 2023-10-10 NOTE — Anesthesia Procedure Notes (Signed)
 Procedure Name: Intubation Date/Time: 10/10/2023 3:51 PM  Performed by: Dorethea Cordella SQUIBB, DOPre-anesthesia Checklist: Patient identified, Emergency Drugs available, Suction available and Patient being monitored Patient Re-evaluated:Patient Re-evaluated prior to induction Oxygen Delivery Method: Circle system utilized Preoxygenation: Pre-oxygenation with 100% oxygen Induction Type: IV induction Ventilation: Mask ventilation without difficulty Laryngoscope Size: Mac and 4 Grade View: Grade I Tube type: Oral Tube size: 7.5 mm Number of attempts: 1 Airway Equipment and Method: Stylet and Oral airway Placement Confirmation: ETT inserted through vocal cords under direct vision, positive ETCO2 and breath sounds checked- equal and bilateral Secured at: 22 cm Tube secured with: Tape Dental Injury: Teeth and Oropharynx as per pre-operative assessment

## 2023-10-10 NOTE — Progress Notes (Signed)
 Progress Note   Patient: [name] FMW:[id number] DOB: May 22, 1928 DOA: 10/08/2023     2 DOS: the patient was seen and examined on 10/10/2023   Brief hospital course: NEELY CECENA is a 89 y.o. male with medical history significant for ischemic stroke with residual expressive aphasia, nonverbal at baseline, residual right hemiparesis, persistent atrial fibrillation chronically anticoagulated on Eliquis , anemia of chronic disease baseline hemoglobin 9-10, CKD stage IV with baseline creatinine 3.2-3.4, essential hypertension, chronic right-sided systolic heart failure, who is admitted to [facility] on 10/08/2023 with acute left hip fracture after presenting from snf to [facility] ED for evaluation of fall.   Patient sustained acute left hip fracture, laboratory workup notable for acute kidney injury, hyponatremia, acute on chronic anemia admitted to TRH service for further management evaluation, orthopedic consulted.  Assessment and Plan: Acute displaced subtrochanteric fracture of proximal left femur- Status post mechanical fall Continue pain control, supportive care. His hemoglobin 5.6, received total 2 units of blood, this am Hb 7.7. last dose Eliquis  8/11 morning. High risk due to his age, comorbidities. Ortho surgery for palliative intent. Orthopedic team plan for possible ORIF procedure today.  Acute on CKD stage IV: Continue gentle IV hydration. Avoid nephrotoxic drugs. UA unremarkable for infection. Monitor daily renal function.  Acute on chronic anemia- Patient's baseline hemoglobin ranges 9-10. His hemoglobin 5.6, received total 2 units of blood, this am Hb 7.7. Discussed with family to stop Eliquis  upon discharge. Follow h/h and transfuse for Hb < 7.  Hypovolemic hypernatremia- In the setting of poor oral intake. Continue gentle IV fluids 1/2NS at 75ml/hr. Trend sodium. Monitor strict input and output.  Persistent A-fib: EKG shows Afib rate controlled. Hold home  Eliquis  due to drop in Hb.    Essential hypertension: Hold oral meds as he is more sleepy. IV hydralazine  as needed for elevated blood pressures.  Hyperlipidemia: Resume statin upon discharge.  Depression: On Lexapro  PTA.  Hospice patient on [facility]- Patient from independent living facility under hospice care.  His overall medical condition deteriorating as per family since few weeks. Hospice team to follow him while inpatient. Patient may need hospice at facility upon discharge.      Nursing supportive care. Fall, aspiration precautions. Diet:  Diet Orders (From admission, onward)     Start     Ordered   10/09/23 1025  Diet NPO time specified Except for: Sips with Meds, Ice Chips  Diet effective now       Question Answer Comment  Except for Sips with Meds   Except for Ice Chips      10/09/23 1025           DVT prophylaxis: SCDs Start: 10/08/23 2307  Level of care: Telemetry Medical   Code Status: Limited: Do not attempt resuscitation (DNR) -DNR-LIMITED -Do Not Intubate/DNI   Subjective: Patient is seen and examined today morning in short stay unit. He is more alert, confused today. No family at bedside. Awaiting ortho intervention.   Physical Exam: Vitals:   10/09/23 2025 10/10/23 0026 10/10/23 0314 10/10/23 0807  BP: (!) 134/52 (!) 130/50 136/67 (!) 116/49  Pulse: 61 61 (!) 55 76  Resp:  20  16  Temp: 98.6 F (37 C) 98.6 F (37 C) 98.8 F (37.1 C) 97.6 F (36.4 C)  TempSrc:  Axillary  Oral  SpO2: 98% 98% 98% 100%  Weight:      Height:        General - Elderly ill looking Caucasian male,  confused, no distress HEENT - PERRLA, EOMI, atraumatic head, non tender sinuses. Lung - Clear, basal rales, rhonchi, no wheezes. Heart - S1, S2 heard, no murmurs, rubs, 1+ pedal edema. Abdomen - Soft, non tender, bowel sounds good Neuro - alert, awake, confused, unable to do full neuro exam. Skin - Warm and dry. Chronic leg skin changes.  Data  Reviewed:      Latest Ref Rng & Units 10/10/2023    9:26 AM 10/09/2023    8:22 PM 10/09/2023    1:05 PM  CBC  WBC 4.0 - 10.5 K/uL 6.5     Hemoglobin 13.0 - 17.0 g/dL 7.7  8.7  6.8   Hematocrit 39.0 - 52.0 % 23.1  27.0  20.0   Platelets 150 - 400 K/uL 102         Latest Ref Rng & Units 10/10/2023    9:26 AM 10/09/2023    1:05 PM 10/09/2023    4:55 AM  BMP  Glucose 70 - 99 mg/dL 882  888  887   BUN 8 - 23 mg/dL 86  78  90   Creatinine 0.61 - 1.24 mg/dL 5.64  5.19  5.46   Sodium 135 - 145 mmol/L 150  149  149   Potassium 3.5 - 5.1 mmol/L 4.3  4.4  4.3   Chloride 98 - 111 mmol/L 121  117  118   CO2 22 - 32 mmol/L 18   19   Calcium  8.9 - 10.3 mg/dL 7.5   7.6    CT Head Wo Contrast Result Date: 10/08/2023 CLINICAL DATA:  Recent fall with headaches and neck pain, initial encounter EXAM: CT HEAD WITHOUT CONTRAST CT CERVICAL SPINE WITHOUT CONTRAST TECHNIQUE: Multidetector CT imaging of the head and cervical spine was performed following the standard protocol without intravenous contrast. Multiplanar CT image reconstructions of the cervical spine were also generated. RADIATION DOSE REDUCTION: This exam was performed according to the departmental dose-optimization program which includes automated exposure control, adjustment of the mA and/or kV according to patient size and/or use of iterative reconstruction technique. COMPARISON:  09/17/2021 FINDINGS: CT HEAD FINDINGS Brain: There are again noted changes consistent with prior left MCA infarct with considerable encephalomalacia. Atrophic changes and chronic white matter ischemic changes are seen as well. No acute hemorrhage, acute infarction or space-occupying mass lesion is noted. Vascular: No hyperdense vessel or unexpected calcification. Skull: Normal. Negative for fracture or focal lesion. Sinuses/Orbits: No acute finding. Other: None. CT CERVICAL SPINE FINDINGS Alignment: Within normal limits. Skull base and vertebrae: 7 cervical segments are well  visualized. Vertebral body height is well maintained. Prior fusion at C6-7 is noted. Multilevel facet hypertrophic changes and osteophytic changes are seen. No acute fracture or acute facet abnormality is noted. Soft tissues and spinal canal: Surrounding soft tissue structures are within normal limits. Upper chest: Visualized lung apices show bilateral pleural effusions which appear moderate to large worse on the right than the left. Other: None IMPRESSION: CT of the head: No acute intracranial abnormality noted. Changes of prior left MCA infarct. Chronic atrophic and ischemic changes. CT of the cervical spine: Multilevel degenerative change without acute abnormality. Electronically Signed   By: Oneil Devonshire M.D.   On: 10/08/2023 22:35   CT Cervical Spine Wo Contrast Result Date: 10/08/2023 CLINICAL DATA:  Recent fall with headaches and neck pain, initial encounter EXAM: CT HEAD WITHOUT CONTRAST CT CERVICAL SPINE WITHOUT CONTRAST TECHNIQUE: Multidetector CT imaging of the head and cervical spine was performed following the standard  protocol without intravenous contrast. Multiplanar CT image reconstructions of the cervical spine were also generated. RADIATION DOSE REDUCTION: This exam was performed according to the departmental dose-optimization program which includes automated exposure control, adjustment of the mA and/or kV according to patient size and/or use of iterative reconstruction technique. COMPARISON:  09/17/2021 FINDINGS: CT HEAD FINDINGS Brain: There are again noted changes consistent with prior left MCA infarct with considerable encephalomalacia. Atrophic changes and chronic white matter ischemic changes are seen as well. No acute hemorrhage, acute infarction or space-occupying mass lesion is noted. Vascular: No hyperdense vessel or unexpected calcification. Skull: Normal. Negative for fracture or focal lesion. Sinuses/Orbits: No acute finding. Other: None. CT CERVICAL SPINE FINDINGS Alignment:  Within normal limits. Skull base and vertebrae: 7 cervical segments are well visualized. Vertebral body height is well maintained. Prior fusion at C6-7 is noted. Multilevel facet hypertrophic changes and osteophytic changes are seen. No acute fracture or acute facet abnormality is noted. Soft tissues and spinal canal: Surrounding soft tissue structures are within normal limits. Upper chest: Visualized lung apices show bilateral pleural effusions which appear moderate to large worse on the right than the left. Other: None IMPRESSION: CT of the head: No acute intracranial abnormality noted. Changes of prior left MCA infarct. Chronic atrophic and ischemic changes. CT of the cervical spine: Multilevel degenerative change without acute abnormality. Electronically Signed   By: Oneil Devonshire M.D.   On: 10/08/2023 22:35   DG Hip Unilat W or Wo Pelvis 2-3 Views Left Result Date: 10/08/2023 CLINICAL DATA:  trauma EXAM: DG HIP (WITH OR WITHOUT PELVIS) 2-3V LEFT COMPARISON:  September 17, 2021 FINDINGS: Osteopenia.Obliquely oriented, comminuted, and displaced subtrochanteric fracture of the proximal femur.The femoral shaft is posteromedially displaced at least 1 shaft width. The fracture line extends into the lesser trochanter, which is medially displaced 1.6 cm.No dislocation of the hip. Lumbar degenerative disc disease. Extensive aortoiliac and peripheral vascular atherosclerosis. IMPRESSION: Obliquely oriented, displaced subtrochanteric fracture of the proximal femur with involvement of the lesser trochanter, as further described above. If clinically warranted, a dedicated CT of the left hip may be of benefit for preoperative planning. Electronically Signed   By: Rogelia Myers M.D.   On: 10/08/2023 21:03   DG Chest Port 1 View Result Date: 10/08/2023 CLINICAL DATA:  trauma EXAM: PORTABLE CHEST - 1 VIEW COMPARISON:  June 25, 2023 FINDINGS: Bilateral perihilar interstitial opacities throughout both lungs. Moderate volume  bilateral pleural effusions. No pneumothorax. Mild cardiomegaly. Sternotomy wires and CABG changes. Tortuous aorta with aortic atherosclerosis. No acute, displaced fracture or destructive lesions. Multilevel thoracic osteophytosis. IMPRESSION: Mild cardiomegaly findings of interstitial edema throughout both lungs. Moderate volume bilateral pleural effusions. Electronically Signed   By: Rogelia Myers M.D.   On: 10/08/2023 20:58   DG Elbow Complete Left Result Date: 10/08/2023 CLINICAL DATA:  trauma EXAM: LEFT ELBOW - COMPLETE 3+ VIEW COMPARISON:  None Available. FINDINGS: Osteopenia.No acute, displaced fracture or dislocation. Superior elevation of the anterior fat pad. There is no evidence of arthropathy or other focal bone abnormality. Soft tissues are unremarkable. IMPRESSION: Elevated anterior fat pad suggests the presence of an elbow joint effusion. While no acute, displaced fracture is visualized, in the setting of trauma, elevation of the anterior fat pad is worrisome for underlying effusion and occult elbow fracture. Electronically Signed   By: Rogelia Myers M.D.   On: 10/08/2023 20:56   DG Shoulder Left Result Date: 10/08/2023 CLINICAL DATA:  fall, injury EXAM: LEFT SHOULDER - 2+ VIEW COMPARISON:  None Available. FINDINGS: Osteopenia.No acute fracture or dislocation. There is no evidence of arthropathy or other focal bone abnormality. Soft tissues are unremarkable. IMPRESSION: No acute fracture or dislocation. Electronically Signed   By: Rogelia Myers M.D.   On: 10/08/2023 20:54    Family Communication: Discussed with patient's son, daughter yesterday, they understand and agree. All questions answered. He may qualify for inpatient hospice pending ortho intervention.  Disposition: Status is: Inpatient Remains inpatient appropriate because: s/p fall, hip fracture, pain control, supportive care.  Planned Discharge Destination: Skilled nursing facility     Time spent: 45  minutes  Author: Concepcion Riser, MD 10/10/2023 1:14 PM Secure chat 7am to 7pm For on call review [email or web address].

## 2023-10-10 NOTE — Consult Note (Signed)
 Orthopaedic Trauma Service (OTS) Consult   [name] MRN: [id number] DOB/AGE: [birth year] 89 y.o.  Reason for Consult:Left hip fracture Referring Physician: Dr. Lonni Poli, MD [place]  HPI: [name] is an 89 y.o. male who is being seen in consultation at the request of Dr. Poli for evaluation of the left hip fracture.  Patient has multiple medical comorbidities and he had a fall at home.  He sustained a left proximal femur fracture.  He had significant drop in his hemoglobin.  He was not medically optimized yesterday when Dr. Poli was planning to take him for surgery as result he asked if I could help manage today due to OR availability.  After further discussion with the patient's family they wish to pursue operative intervention due to the palliative nature of it and to take away his pain and discomfort from the fracture.  I met with the patient and his family at bedside today.  Currently the patient is not able to verbalize and is not awakening or makes eye contact.  His daughter and son are at bedside.  Past Medical History:  Diagnosis Date   Actinic keratosis    BPH with urinary obstruction    Bradycardia    CAD (coronary artery disease)    CABG x5   Cerebrovascular accident (stroke) (HCC)    With right hemiparesis and persistent expressive aphasia   GERD (gastroesophageal reflux disease)    History of hydronephrosis    Hx of hydronephrosis 07/08/2011   right    Hyperlipidemia    Hypertension    Iron deficiency anemia due to chronic blood loss 03/26/2007   Now resolved     Iron deficiency anemia secondary to blood loss (chronic)    Persistent atrial fibrillation (HCC)    Pulmonary hypertension (HCC)    by echo 2013   Rosacea    Situational depression    Severe   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   treated after biopsy   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 He has never used smokeless tobacco. He reports current alcohol use of about 1.0 standard drink of alcohol per week. He reports that he does not use drugs.  Allergies:  Allergies  Allergen Reactions   Ciprofloxacin Hcl Other (See Comments)    Reaction unknown by family   Sulfamethoxazole-Trimethoprim Other (See Comments)    Reaction unknown by family    Medications:  No current facility-administered medications on file prior to encounter.   Current Outpatient Medications on File Prior to Encounter  Medication Sig Dispense Refill   amLODipine  (NORVASC ) 5 MG tablet TAKE 1 TABLET  BY MOUTH ONCE DAILY 90 tablet 3   apixaban  (ELIQUIS ) 2.5 MG TABS tablet Take 1 tablet (2.5 mg total) by mouth 2 (two) times daily. 60 tablet 3   doxazosin  (CARDURA ) 4 MG tablet Take 1 tablet (4 mg total) by mouth daily. 90 tablet 3   escitalopram  (LEXAPRO ) 20 MG tablet Take 1 tablet (20 mg total) by mouth daily. 30 tablet 11   furosemide  (LASIX ) 40 MG tablet Take 1 tablet (40 mg total) by mouth 2 (two) times daily. 180 tablet 3   memantine  (NAMENDA ) 10 MG tablet Take 1 tablet (10 mg total) by mouth daily. 90 tablet 3   OXYGEN Inhale 2 L into the lungs continuous.     rosuvastatin  (CRESTOR ) 20 MG tablet TAKE 1 TABLET BY MOUTH ONCE DAILY (Patient taking differently: Take 20 mg by mouth at bedtime.) 90 tablet 3     ROS: Constitutional: No fever or chills Vision: No changes in vision ENT: No  difficulty swallowing CV: No chest pain Pulm: No SOB or wheezing GI: No nausea or vomiting GU: No urgency or inability to hold urine Skin: No poor wound healing Neurologic: No numbness or tingling Psychiatric: No depression or anxiety Heme: No bruising Allergic: No reaction to medications or food   Exam: Blood pressure (!) 116/49, pulse 76, temperature 97.6 F (36.4 C), temperature source Oral, resp. rate 16, height 6' (1.829 m), weight 75.6 kg, SpO2 100%. General: No acute distress Orientation: Not awake or alert oriented Mood and Affect: Unable to fully assess Gait: Unable to assess Coordination and balance: Unable to assess  Left lower extremity: Leg is swollen compartments are soft compressible.  Not following commands to establish neuro exam.  Warm well-perfused foot.  Medical Decision Making: Data: Imaging: X-rays reviewed which shows a proximal femur fracture with significant displacement.  Labs:  Results for orders placed or performed during the hospital encounter of 10/08/23 (from the past 24 hours)  Hemoglobin and hematocrit, blood     Status: Abnormal   Collection Time: 10/09/23  8:22 PM  Result Value Ref Range   Hemoglobin 8.7 (L) 13.0 - 17.0 g/dL   HCT 72.9 (L) 60.9 - 47.9 %  CBC     Status: Abnormal   Collection Time: 10/10/23  9:26 AM  Result Value Ref Range   WBC 6.5 4.0 - 10.5 K/uL   RBC 2.31 (L) 4.22 - 5.81 MIL/uL   Hemoglobin 7.7 (L) 13.0 - 17.0 g/dL   HCT 76.8 (L) 60.9 - 47.9 %   MCV 100.0 80.0 - 100.0 fL   MCH 33.3 26.0 - 34.0 pg   MCHC 33.3 30.0 - 36.0 g/dL   RDW 80.9 (H) 88.4 - 84.4 %   Platelets 102 (L) 150 - 400 K/uL   nRBC 0.0 0.0 - 0.2 %  Basic metabolic panel with GFR     Status: Abnormal   Collection Time: 10/10/23  9:26 AM  Result Value Ref Range   Sodium 150 (H) 135 - 145 mmol/L   Potassium 4.3 3.5 - 5.1 mmol/L   Chloride 121 (H) 98 - 111 mmol/L   CO2 18 (L) 22 - 32 mmol/L   Glucose, Bld 117 (H) 70 - 99 mg/dL   BUN 86 (H) 8 - 23  mg/dL   Creatinine, Ser 5.64 (H) 0.61 - 1.24 mg/dL   Calcium  7.5 (L) 8.9 - 10.3 mg/dL   GFR, Estimated 12 (L) >60 mL/min   Anion gap 11 5 - 15  Blood transfusion report - scanned  Status: None ()   Collection Time: 10/10/23 11:36 AM   Narrative   Ordered by an unspecified provider.  Blood transfusion report - scanned     Status: None   Collection Time: 10/10/23 11:36 AM   Narrative   Ordered by an unspecified provider.  Prepare RBC (crossmatch) INTRAOP ONLY     Status: None   Collection Time: 10/10/23  1:30 PM  Result Value Ref Range   Order Confirmation      ORDER PROCESSED BY BLOOD BANK Performed at [facility], [address], [place], [place] [address]   I-STAT, [place] [address]     Status: Abnormal   Collection Time: 10/10/23  2:13 PM  Result Value Ref Range   Sodium 150 (H) 135 - 145 mmol/L   Potassium 5.2 (H) 3.5 - 5.1 mmol/L   Chloride 119 (H) 98 - 111 mmol/L   BUN 96 (H) 8 - 23 mg/dL   Creatinine, Ser 5.59 (H) 0.61 - 1.24 mg/dL   Glucose, Bld 889 (H) 70 - 99 mg/dL   Calcium , Ion 0.96 (L) 1.15 - 1.40 mmol/L   TCO2 19 (L) 22 - 32 mmol/L   Hemoglobin 6.8 (LL) 13.0 - 17.0 g/dL   HCT 79.9 (L) 60.9 - 47.9 %   Comment NOTIFIED PHYSICIAN   I-STAT, chem 8     Status: Abnormal   Collection Time: 10/10/23  2:19 PM  Result Value Ref Range   Sodium 152 (H) 135 - 145 mmol/L   Potassium 4.3 3.5 - 5.1 mmol/L   Chloride 117 (H) 98 - 111 mmol/L   BUN 79 (H) 8 - 23 mg/dL   Creatinine, Ser 5.69 (H) 0.61 - 1.24 mg/dL   Glucose, Bld 888 (H) 70 - 99 mg/dL   Calcium , Ion 1.02 (L) 1.15 - 1.40 mmol/L   TCO2 20 (L) 22 - 32 mmol/L   Hemoglobin 7.5 (L) 13.0 - 17.0 g/dL   HCT 77.9 (L) 60.9 - 47.9 %     Imaging or Labs ordered: None  Medical history and chart was reviewed and case discussed with medical provider.  Assessment/Plan: 89 year old male with multiple comorbidities with a left proximal femur fracture.  Extensive discussion was had with the patient's family.  Risks and  benefits of proceeding with surgical intervention were discussed.  Even in a patient with dementia and hospice as this patient is fracture is very painful and this surgery is a palliative measure.  I discussed with them that there is a significant risk of further bleeding with not fixing it and there is a risk of bleeding with surgery as well.  However I do feel that utilization would provide the discomfort for this patient.  After full discussion they wish to proceed to proceed today.  He is going to get another unit of blood prior to surgery.  [name] MYRTIS Light, MD [facility] [phone] (office) [email or web address]

## 2023-10-10 NOTE — Interval H&P Note (Signed)
 History and Physical Interval Note:  10/10/2023 3:02 PM  [name]  has presented today for surgery, with the diagnosis of left subtrochanteric fracture left femur.  The various methods of treatment have been discussed with the patient and family. After consideration of risks, benefits and other options for treatment, the patient has consented to  Procedure(s): FIXATION, FRACTURE, INTERTROCHANTERIC, WITH INTRAMEDULLARY ROD (Left) as a surgical intervention.  The patient's history has been reviewed, patient examined, no change in status, stable for surgery.  I have reviewed the patient's chart and labs.  Questions were answered to the patient's satisfaction.     Jemiah Cuadra P [name]

## 2023-10-10 NOTE — Op Note (Signed)
 Orthopaedic Surgery Operative Note (CSN: [id number] ) Date of Surgery: 10/10/2023  Admit Date: 10/08/2023   Diagnoses: Pre-Op Diagnoses: Left proximal femur fracture  Post-Op Diagnosis: Same  Procedures: CPT 27245-Cephalomedullary nailing of left intertrochanteric femur fracture  Surgeons : Primary: Kendal Franky SQUIBB, MD  Assistant: Lauraine Moores, PA-C  Location: OR 3   Anesthesia: General   Antibiotics: Ancef  2g preop   Tourniquet time: None    Estimated Blood Loss: 150 mL  Complications:* No complications entered in OR log *   Specimens:* No specimens in log *   Implants: Implant Name Type Inv. Item Serial No. Manufacturer Lot No. LRB No. Used Action  SCREW LAG COMPR KIT 105/100 - [id number] Screw SCREW LAG COMPR KIT 105/100 [id number] [facility] [employer] [id number] Left 1 Implanted  NAIL LOCK CANN 10X380 130D LT - [id number] Nail NAIL LOCK CANN 10X380 130D LT [id number] [employer] [facility] [employer] [id number] Left 1 Implanted  SCREW TRIGEN LOW PROF 5.0X40 - [id number] Screw SCREW TRIGEN LOW PROF 5.0X40 [id number] [facility] [employer] [id number] Left 1 Implanted     Indications for Surgery: 89-year-old male who had a ground-level fall with a left proximal intertrochanteric femur fracture.  Due to the unstable nature of his injury I recommend proceeding with cephalomedullary nailing.  Risks and benefits were discussed with the patient's family.  Risks include but not limited to bleeding, infection, malunion, nonunion, hardware failure, hardware irritation, nerve and blood vessel injury.  I discussed that this is a palliative measure as he is on hospice.  They agreed to proceed with surgery and consent was obtained.  Operative Findings: 1.  Cephalomedullary nailing of left intertrochanteric femur fracture using [employer] InterTAN 10 x 380 mm nail with a 105 mm lag screw and 100 mm compression screw.  Procedure: The patient was identified in the preoperative holding  area. Consent was confirmed with the patient and their family and all questions were answered. The operative extremity was marked after confirmation with the patient. he was then brought back to the operating room by our anesthesia colleagues.  He was placed under general anesthetic and carefully transferred over to the [facility] table.  Traction was applied to the left lower extremity and fluoroscopic imaging showed adequate alignment.  The left hip and leg were prepped and draped in usual sterile fashion.  A timeout was performed to verify the patient, the procedure, and the extremity.  Preoperative antibiotics were dosed.  Small incision proximal to the greater trochanter was made and carried down through skin subcutaneous tissue.  I directed the threaded guidewire into the tip of the greater trochanter and advanced into the proximal metaphysis.  I then used the entry reamer to enter the medullary canal.  There is significant distraction on the lateral view which required a percutaneous incision laterally to clean out some of the hematoma as well as to clamp the fracture together.  After this I was able to pass a ball-tipped guidewire down the central canal and seated into the distal metaphysis.  I then measured the length and chose to use a 380 mm nail.  I then placed a 10 mm reamer and did not obtain much chatter.  I then placed a 10 x 380 mm nail down the center of the canal and the fracture aligned appropriately.  Using the targeting arm I directed a threaded guidewire into the head neck segment.  I then confirmed adequate tip apex distance and chose to use 105 mm screw.  I then  drilled the path for the compression screw and placed an antirotation bar.  I then drilled the path for the lag screw and placed the lag screw.  I then proceeded to place the compression screw and compressed approximately 8 mm.  Excellent fixation was obtained and I statically locked the proximal portion of the nail.  The targeting arm  was removed and fluoroscopic imaging showed the proximal nail fixation.  I then turned my attention to the distal segment.  Perfect circle technique was used and I placed a lateral to medial distal interlocking screw.  Final fluoroscopic imaging was obtained.  The incisions were irrigated and closed with 2-0 Monocryl and Dermabond.  Sterile dressings were applied.  The patient was then awoke from anesthesia and taken to the PACU in stable condition.  Post Op Plan/Instructions: The patient be weightbearing as tolerated to the left lower extremity.  He will receive postoperative Ancef .  I would hold anticoagulation until hemoglobin stabilizes.  He will mobilize with physical and Occupational Therapy.  I was present and performed the entire surgery.  Lauraine Moores, PA-C did assist me throughout the case. An assistant was necessary given the difficulty in approach, maintenance of reduction and ability to instrument the fracture.   Franky Light, MD [facility]

## 2023-10-10 NOTE — TOC Initial Note (Addendum)
 Transition of Care [facility] - Initial/Assessment Note    Patient Details  Name: [name] MRN: [id number] Date of Birth: 1929/01/05  Transition of Care [facility]) CM/SW Contact:    Nola Devere Hands, RN Phone Number: 10/10/2023, 8:47 AM  Clinical Narrative:                 89 yr old male, admitted s/p fall with left hip closed fracture, at [facility] Independent living. Patient is active with [facility], has Hypertensive Heart Failure, Heart disease. Possible surgery, family to discuss. TOC Team will continue to follow.   Expected Discharge Plan:  (to be determined) Barriers to Discharge: Continued Medical Work up   Patient Goals and CMS Choice            Expected Discharge Plan and Services                                              Prior Living Arrangements/Services                       Activities of Daily Living   ADL Screening (condition at time of admission) Independently performs ADLs?: No Does the patient have a NEW difficulty with bathing/dressing/toileting/self-feeding that is expected to last >3 days?: No Does the patient have a NEW difficulty with getting in/out of bed, walking, or climbing stairs that is expected to last >3 days?: No Does the patient have a NEW difficulty with communication that is expected to last >3 days?: No Is the patient deaf or have difficulty hearing?: No Does the patient have difficulty seeing, even when wearing glasses/contacts?: No Does the patient have difficulty concentrating, remembering, or making decisions?: No  Permission Sought/Granted                  Emotional Assessment              Admission diagnosis:  Hyperchloremia [E87.8] Hypernatremia [E87.0] Closed left hip fracture (HCC) [S72.002A] Closed fracture of left hip, initial encounter (HCC) [S72.002A] Traumatic hematoma of head, initial encounter [S00.93XA] Acute renal failure superimposed on chronic kidney disease, unspecified acute  renal failure type, unspecified CKD stage (HCC) [N17.9, N18.9] Patient Active Problem List   Diagnosis Date Noted   Fall at home, initial encounter 10/09/2023   Acute on chronic anemia 10/09/2023   Acute hypernatremia 10/09/2023   SIRS (systemic inflammatory response syndrome) (HCC) 10/09/2023   Persistent atrial fibrillation (HCC) 10/09/2023   Closed left hip fracture (HCC) 10/08/2023   Acute renal failure superimposed on stage 4 chronic kidney disease (HCC) 06/26/2023   CHF (congestive heart failure) (HCC) 06/26/2023   Acute on chronic diastolic CHF (congestive heart failure) (HCC) 06/25/2023   History of essential hypertension 06/25/2023   Depression 06/25/2023   Hypertensive urgency 06/25/2023   Elevated troponin 06/25/2023   Acute on chronic diastolic (congestive) heart failure (HCC) 06/25/2023   Disorder of bone and cartilage 06/14/2021   Disorder of gallbladder 06/14/2021   Heart failure with preserved ejection fraction (HCC) 06/14/2021   Macular degeneration 01/21/2020   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    Left wrist pain 11/01/2016   Venous insufficiency 10/02/2016   Right hydrocele 04/03/2016   Thrombocytopenia (HCC) 11/22/2015   CKD (chronic kidney disease), stage IV (HCC) 11/22/2015   Sensorineural hearing loss (SNHL), bilateral 07/06/2015   Memory loss  07/21/2014   Bradycardia 07/09/2011   Chronic atrial fibrillation (HCC) 07/08/2011   Actinic keratosis 07/30/2009   Major depression in remission (HCC) 07/26/2007   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   Hemiparesis affecting right side as late effect of cerebrovascular accident (HCC) 03/26/2007   HLD (hyperlipidemia) 08/23/2006   Essential hypertension 08/23/2006   CAD (coronary artery disease) 08/23/2006   GERD 08/23/2006   PCP:  Katrinka [name], MD Pharmacy:   [facility] [place], [place] - [address] [place]  [address] Phone: [phone] Fax: [phone]     Social Drivers of Health (SDOH) Social History: SDOH Screenings   Food Insecurity: Unknown (10/09/2023)  Housing: Unknown (10/09/2023)  Transportation Needs: No Transportation Needs (10/09/2023)  Utilities: Patient Unable To Answer (10/09/2023)  Depression (PHQ2-9): Low Risk  (04/23/2023)  Financial Resource Strain: Low Risk  (07/13/2022)  Physical Activity: Inactive (07/13/2022)  Social Connections: Unknown (10/09/2023)  Stress: No Stress Concern Present (07/13/2022)  Tobacco Use: Medium Risk (10/09/2023)   SDOH Interventions:     Readmission Risk Interventions     No data to display

## 2023-10-10 NOTE — H&P (View-Only) (Signed)
 Orthopaedic Trauma Service (OTS) Consult   [name] MRN: [id number] DOB/AGE: [birth year] 89 y.o.  Reason for Consult:Left hip fracture Referring Physician: Dr. Lonni Poli, MD [place]  HPI: [name] is an 89 y.o. male who is being seen in consultation at the request of Dr. Poli for evaluation of the left hip fracture.  Patient has multiple medical comorbidities and he had a fall at home.  He sustained a left proximal femur fracture.  He had significant drop in his hemoglobin.  He was not medically optimized yesterday when Dr. Poli was planning to take him for surgery as result he asked if I could help manage today due to OR availability.  After further discussion with the patient's family they wish to pursue operative intervention due to the palliative nature of it and to take away his pain and discomfort from the fracture.  I met with the patient and his family at bedside today.  Currently the patient is not able to verbalize and is not awakening or makes eye contact.  His daughter and son are at bedside.  Past Medical History:  Diagnosis Date   Actinic keratosis    BPH with urinary obstruction    Bradycardia    CAD (coronary artery disease)    CABG x5   Cerebrovascular accident (stroke) (HCC)    With right hemiparesis and persistent expressive aphasia   GERD (gastroesophageal reflux disease)    History of hydronephrosis    Hx of hydronephrosis 07/08/2011   right    Hyperlipidemia    Hypertension    Iron deficiency anemia due to chronic blood loss 03/26/2007   Now resolved     Iron deficiency anemia secondary to blood loss (chronic)    Persistent atrial fibrillation (HCC)    Pulmonary hypertension (HCC)    by echo 2013   Rosacea    Situational depression    Severe   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   treated after biopsy   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 He has never used smokeless tobacco. He reports current alcohol use of about 1.0 standard drink of alcohol per week. He reports that he does not use drugs.  Allergies:  Allergies  Allergen Reactions   Ciprofloxacin Hcl Other (See Comments)    Reaction unknown by family   Sulfamethoxazole-Trimethoprim Other (See Comments)    Reaction unknown by family    Medications:  No current facility-administered medications on file prior to encounter.   Current Outpatient Medications on File Prior to Encounter  Medication Sig Dispense Refill   amLODipine  (NORVASC ) 5 MG tablet TAKE 1 TABLET  BY MOUTH ONCE DAILY 90 tablet 3   apixaban  (ELIQUIS ) 2.5 MG TABS tablet Take 1 tablet (2.5 mg total) by mouth 2 (two) times daily. 60 tablet 3   doxazosin  (CARDURA ) 4 MG tablet Take 1 tablet (4 mg total) by mouth daily. 90 tablet 3   escitalopram  (LEXAPRO ) 20 MG tablet Take 1 tablet (20 mg total) by mouth daily. 30 tablet 11   furosemide  (LASIX ) 40 MG tablet Take 1 tablet (40 mg total) by mouth 2 (two) times daily. 180 tablet 3   memantine  (NAMENDA ) 10 MG tablet Take 1 tablet (10 mg total) by mouth daily. 90 tablet 3   OXYGEN Inhale 2 L into the lungs continuous.     rosuvastatin  (CRESTOR ) 20 MG tablet TAKE 1 TABLET BY MOUTH ONCE DAILY (Patient taking differently: Take 20 mg by mouth at bedtime.) 90 tablet 3     ROS: Constitutional: No fever or chills Vision: No changes in vision ENT: No  difficulty swallowing CV: No chest pain Pulm: No SOB or wheezing GI: No nausea or vomiting GU: No urgency or inability to hold urine Skin: No poor wound healing Neurologic: No numbness or tingling Psychiatric: No depression or anxiety Heme: No bruising Allergic: No reaction to medications or food   Exam: Blood pressure (!) 116/49, pulse 76, temperature 97.6 F (36.4 C), temperature source Oral, resp. rate 16, height 6' (1.829 m), weight 75.6 kg, SpO2 100%. General: No acute distress Orientation: Not awake or alert oriented Mood and Affect: Unable to fully assess Gait: Unable to assess Coordination and balance: Unable to assess  Left lower extremity: Leg is swollen compartments are soft compressible.  Not following commands to establish neuro exam.  Warm well-perfused foot.  Medical Decision Making: Data: Imaging: X-rays reviewed which shows a proximal femur fracture with significant displacement.  Labs:  Results for orders placed or performed during the hospital encounter of 10/08/23 (from the past 24 hours)  Hemoglobin and hematocrit, blood     Status: Abnormal   Collection Time: 10/09/23  8:22 PM  Result Value Ref Range   Hemoglobin 8.7 (L) 13.0 - 17.0 g/dL   HCT 72.9 (L) 60.9 - 47.9 %  CBC     Status: Abnormal   Collection Time: 10/10/23  9:26 AM  Result Value Ref Range   WBC 6.5 4.0 - 10.5 K/uL   RBC 2.31 (L) 4.22 - 5.81 MIL/uL   Hemoglobin 7.7 (L) 13.0 - 17.0 g/dL   HCT 76.8 (L) 60.9 - 47.9 %   MCV 100.0 80.0 - 100.0 fL   MCH 33.3 26.0 - 34.0 pg   MCHC 33.3 30.0 - 36.0 g/dL   RDW 80.9 (H) 88.4 - 84.4 %   Platelets 102 (L) 150 - 400 K/uL   nRBC 0.0 0.0 - 0.2 %  Basic metabolic panel with GFR     Status: Abnormal   Collection Time: 10/10/23  9:26 AM  Result Value Ref Range   Sodium 150 (H) 135 - 145 mmol/L   Potassium 4.3 3.5 - 5.1 mmol/L   Chloride 121 (H) 98 - 111 mmol/L   CO2 18 (L) 22 - 32 mmol/L   Glucose, Bld 117 (H) 70 - 99 mg/dL   BUN 86 (H) 8 - 23  mg/dL   Creatinine, Ser 5.64 (H) 0.61 - 1.24 mg/dL   Calcium  7.5 (L) 8.9 - 10.3 mg/dL   GFR, Estimated 12 (L) >60 mL/min   Anion gap 11 5 - 15  Blood transfusion report - scanned  Status: None ()   Collection Time: 10/10/23 11:36 AM   Narrative   Ordered by an unspecified provider.  Blood transfusion report - scanned     Status: None   Collection Time: 10/10/23 11:36 AM   Narrative   Ordered by an unspecified provider.  Prepare RBC (crossmatch) INTRAOP ONLY     Status: None   Collection Time: 10/10/23  1:30 PM  Result Value Ref Range   Order Confirmation      ORDER PROCESSED BY BLOOD BANK Performed at [facility], [address], [place], [place] [address]   I-STAT, [place]     Status: Abnormal   Collection Time: 10/10/23  2:13 PM  Result Value Ref Range   Sodium 150 (H) 135 - 145 mmol/L   Potassium 5.2 (H) 3.5 - 5.1 mmol/L   Chloride 119 (H) 98 - 111 mmol/L   BUN 96 (H) 8 - 23 mg/dL   Creatinine, Ser 5.59 (H) 0.61 - 1.24 mg/dL   Glucose, Bld 889 (H) 70 - 99 mg/dL   Calcium , Ion 0.96 (L) 1.15 - 1.40 mmol/L   TCO2 19 (L) 22 - 32 mmol/L   Hemoglobin 6.8 (LL) 13.0 - 17.0 g/dL   HCT 79.9 (L) 60.9 - 47.9 %   Comment NOTIFIED PHYSICIAN   I-STAT, chem 8     Status: Abnormal   Collection Time: 10/10/23  2:19 PM  Result Value Ref Range   Sodium 152 (H) 135 - 145 mmol/L   Potassium 4.3 3.5 - 5.1 mmol/L   Chloride 117 (H) 98 - 111 mmol/L   BUN 79 (H) 8 - 23 mg/dL   Creatinine, Ser 5.69 (H) 0.61 - 1.24 mg/dL   Glucose, Bld 888 (H) 70 - 99 mg/dL   Calcium , Ion 1.02 (L) 1.15 - 1.40 mmol/L   TCO2 20 (L) 22 - 32 mmol/L   Hemoglobin 7.5 (L) 13.0 - 17.0 g/dL   HCT 77.9 (L) 60.9 - 47.9 %     Imaging or Labs ordered: None  Medical history and chart was reviewed and case discussed with medical provider.  Assessment/Plan: 89 year old male with multiple comorbidities with a left proximal femur fracture.  Extensive discussion was had with the patient's family.  Risks and  benefits of proceeding with surgical intervention were discussed.  Even in a patient with dementia and hospice as this patient is fracture is very painful and this surgery is a palliative measure.  I discussed with them that there is a significant risk of further bleeding with not fixing it and there is a risk of bleeding with surgery as well.  However I do feel that utilization would provide the discomfort for this patient.  After full discussion they wish to proceed to proceed today.  He is going to get another unit of blood prior to surgery.  Franky MYRTIS Light, MD [facility] [phone] (office) [email or web address]

## 2023-10-10 NOTE — Transfer of Care (Signed)
 Immediate Anesthesia Transfer of Care Note  Patient: [name]  Procedure(s) Performed: FIXATION, FRACTURE, INTERTROCHANTERIC, WITH INTRAMEDULLARY ROD, LEFT (Left: Leg Upper)  Patient Location: PACU  Anesthesia Type:General  Level of Consciousness: drowsy  Airway & Oxygen Therapy: Patient Spontanous Breathing and Patient connected to face mask oxygen  Post-op Assessment: Report given to RN and Post -op Vital signs reviewed and stable  Post vital signs: Reviewed and stable  Last Vitals:  Vitals Value Taken Time  BP 113/55 10/10/23 17:15  Temp    Pulse 62 10/10/23 17:16  Resp 19 10/10/23 17:16  SpO2 93 % 10/10/23 17:16  Vitals shown include unfiled device data.  Last Pain:  Vitals:   10/10/23 1523  TempSrc: Oral  PainSc:          Complications: No notable events documented.

## 2023-10-10 NOTE — Anesthesia Postprocedure Evaluation (Signed)
 Anesthesia Post Note  Patient: [name]  Procedure(s) Performed: FIXATION, FRACTURE, INTERTROCHANTERIC, WITH INTRAMEDULLARY ROD, LEFT (Left: Leg Upper)     Patient location during evaluation: PACU Anesthesia Type: General Level of consciousness: awake and alert Pain management: pain level controlled Vital Signs Assessment: post-procedure vital signs reviewed and stable Respiratory status: spontaneous breathing, nonlabored ventilation, respiratory function stable and patient connected to nasal cannula oxygen Cardiovascular status: blood pressure returned to baseline and stable Postop Assessment: no apparent nausea or vomiting Anesthetic complications: no   No notable events documented.  Last Vitals:  Vitals:   10/10/23 1730 10/10/23 1745  BP: 118/85 117/62  Pulse: 66 64  Resp: 19 20  Temp:  36.5 C  SpO2: 94% 97%    Last Pain:  Vitals:   10/10/23 1523  TempSrc: Oral  PainSc:                  Cordella SQUIBB Aslyn Cottman

## 2023-10-10 NOTE — Anesthesia Preprocedure Evaluation (Addendum)
 Anesthesia Evaluation  Patient identified by MRN, date of birth, ID band Patient awake    Reviewed: Allergy & Precautions, NPO status , Patient's Chart, lab work & pertinent test results  History of Anesthesia Complications Negative for: history of anesthetic complications  Airway Mallampati: III  TM Distance: >3 FB Neck ROM: Full    Dental  (+) Edentulous Upper, Edentulous Lower   Pulmonary neg shortness of breath, neg sleep apnea, neg COPD, neg recent URI, former smoker   Pulmonary exam normal breath sounds clear to auscultation       Cardiovascular hypertension, pulmonary hypertension(-) angina + CAD, + CABG and +CHF  + dysrhythmias Atrial Fibrillation + Valvular Problems/Murmurs (mild MR, moderate TR)  Rhythm:Regular Rate:Normal  HLD  TTE 06/26/2023: IMPRESSIONS    1. Left ventricular ejection fraction, by estimation, is 70 to 75%. Left  ventricular ejection fraction by PLAX is 73 %. The left ventricle has  hyperdynamic function. The left ventricle has no regional wall motion  abnormalities. There is mild concentric  left ventricular hypertrophy. There is the interventricular septum is  flattened in systole and diastole, consistent with right ventricular  pressure and volume overload.   2. Right ventricular systolic function is mildly reduced. The right  ventricular size is severely enlarged. There is severely elevated  pulmonary artery systolic pressure. The estimated right ventricular  systolic pressure is 65.1 mmHg.   3. Left atrial size was severely dilated.   4. Right atrial size was severely dilated.   5. The mitral valve is normal in structure. Mild mitral valve  regurgitation.   6. The tricuspid valve is abnormal. Tricuspid valve regurgitation is  moderate.   7. The aortic valve is tricuspid. There is mild calcification of the  aortic valve. Aortic valve regurgitation is trivial. Aortic valve   sclerosis/calcification is present, without any evidence of aortic  stenosis.   8. The inferior vena cava is dilated in size with <50% respiratory  variability, suggesting right atrial pressure of 15 mmHg.     Neuro/Psych neg Seizures PSYCHIATRIC DISORDERS  Depression    CVA (right hemiparesis, aphasia), Residual Symptoms    GI/Hepatic Neg liver ROS,GERD  ,,  Endo/Other  negative endocrine ROS    Renal/GU CRF and ARFRenal disease     Musculoskeletal   Abdominal   Peds  Hematology  (+) Blood dyscrasia, anemia Lab Results      Component                Value               Date                      WBC                      6.5                 10/10/2023                HGB                      7.7 (L)             10/10/2023                HCT                      23.1 (L)  10/10/2023                MCV                      100.0               10/10/2023                PLT                      102 (L)             10/10/2023              Anesthesia Other Findings 89 y.o. male with medical history significant for ischemic stroke with residual expressive aphasia, nonverbal at baseline, residual right hemiparesis, persistent atrial fibrillation chronically anticoagulated on Eliquis , anemia of chronic disease baseline hemoglobin 9-10, CKD stage IV with baseline creatinine 3.2-3.4, essential hypertension, chronic right-sided systolic heart failure, who is admitted to [facility] on 10/08/2023 with acute left hip fracture  Last Eliquis : 10/08/2023  Reproductive/Obstetrics                              Anesthesia Physical Anesthesia Plan  ASA: 4  Anesthesia Plan: General   Post-op Pain Management:    Induction: Intravenous  PONV Risk Score and Plan: 2 and Dexamethasone and Treatment may vary due to age or medical condition  Airway Management Planned: Oral ETT  Additional Equipment:   Intra-op Plan:   Post-operative Plan: Possible  Post-op intubation/ventilation  Informed Consent: I have reviewed the patients History and Physical, chart, labs and discussed the procedure including the risks, benefits and alternatives for the proposed anesthesia with the patient or authorized representative who has indicated his/her understanding and acceptance.    Discussed DNR with power of attorney.   Dental advisory given  Plan Discussed with: CRNA and Anesthesiologist  Anesthesia Plan Comments: (Risks of general anesthesia discussed including, but not limited to, sore throat, hoarse voice, chipped/damaged teeth, injury to vocal cords, nausea and vomiting, allergic reactions, lung infection, heart attack, stroke, and death. Discussed with the patient's family that the patient is at an increased risk for needing post-op ventilation and ICU stay after surgery as well as at an increased risk for other adverse events including cardiac arrest and death. They are partially suspending the DNR. Medications and shocks are okay, but no chest compressions. All questions answered. )         Anesthesia Quick Evaluation

## 2023-10-10 NOTE — Progress Notes (Signed)
 [facility] [phone] [facility] Liaison Note   Visited with patient and daughter prior to patient's surgery.    Patient remains inpatient appropriate for skilled monitoring, assessment and medication titration for complex symptom management.   V/S: 97.9, 135/68, 66, 20 Sats 96% on 2LNC   I/O: not recorded   Abnormal Labs: Hgb 7.5, BUN 79, Cr. 4.30 Diagnostics: Not monitored      Recommendations/Plan per Dr. Eugena progress note dated 10/10/2023:   Assessment and Plan: Acute displaced subtrochanteric fracture of proximal left femur- Status post mechanical fall Continue pain control, supportive care. His hemoglobin 5.6, received total 2 units of blood, this am Hb 7.7. last dose Eliquis  8/11 morning. High risk due to his age, comorbidities. Ortho surgery for palliative intent. Orthopedic team plan for possible ORIF procedure today.   Acute on CKD stage IV: Continue gentle IV hydration. Avoid nephrotoxic drugs. UA unremarkable for infection. Monitor daily renal function.   Acute on chronic anemia- Patient's baseline hemoglobin ranges 9-10. His hemoglobin 5.6, received total 2 units of blood, this am Hb 7.7. Discussed with family to stop Eliquis  upon discharge. Follow h/h and transfuse for Hb < 7.   Hypovolemic hypernatremia- In the setting of poor oral intake. Continue gentle IV fluids 1/2NS at 75ml/hr. Trend sodium. Monitor strict input and output.   Persistent A-fib: EKG shows Afib rate controlled. Hold home Eliquis  due to drop in Hb.     Essential hypertension: Hold oral meds as he is more sleepy. IV hydralazine  as needed for elevated blood pressures.   Hyperlipidemia: Resume statin upon discharge.   Depression: On Lexapro  PTA.   Hospice patient on [facility]- Patient from independent living facility under hospice care.  His overall medical condition deteriorating as per family since few weeks. Hospice team to follow him while inpatient. Patient  may need hospice at facility upon discharge.           Nursing supportive care. Fall, aspiration precautions. Diet:  Diet Orders (From admission, onward)        Start     Ordered    10/09/23 1025   Diet NPO time specified Except for: Sips with Meds, Ice Chips  Diet effective now       Question Answer Comment  Except for Sips with Meds    Except for Ice Chips       10/09/23 1025                DVT prophylaxis: SCDs Start: 10/08/23 2307   Level of care: Telemetry Medical   Code Status: Limited: Do not attempt resuscitation (DNR) -DNR-LIMITED -Do Not Intubate/DNI    Subjective: Patient is seen and examined today morning in short stay unit. He is more alert, confused today. No family at bedside. Awaiting ortho intervention.    Physical Exam:       Vitals:    10/09/23 2025 10/10/23 0026 10/10/23 0314 10/10/23 0807  BP: (!) 134/52 (!) 130/50 136/67 (!) 116/49  Pulse: 61 61 (!) 55 76  Resp:   20   16  Temp: 98.6 F (37 C) 98.6 F (37 C) 98.8 F (37.1 C) 97.6 F (36.4 C)  TempSrc:   Axillary   Oral  SpO2: 98% 98% 98% 100%  Weight:          Height:              General - Elderly ill looking Caucasian male, confused, no distress HEENT - PERRLA, EOMI, atraumatic head, non tender sinuses.  Lung - Clear, basal rales, rhonchi, no wheezes. Heart - S1, S2 heard, no murmurs, rubs, 1+ pedal edema. Abdomen - Soft, non tender, bowel sounds good Neuro - alert, awake, confused, unable to do full neuro exam. Skin - Warm and dry. Chronic leg skin changes.   Data Reviewed:         Latest Ref Rng & Units 10/10/2023    9:26 AM 10/09/2023    8:22 PM 10/09/2023    1:05 PM  CBC  WBC 4.0 - 10.5 K/uL 6.5       Hemoglobin 13.0 - 17.0 g/dL 7.7  8.7  6.8   Hematocrit 39.0 - 52.0 % 23.1  27.0  20.0   Platelets 150 - 400 K/uL 102             Latest Ref Rng & Units 10/10/2023    9:26 AM 10/09/2023    1:05 PM 10/09/2023    4:55 AM  BMP  Glucose 70 - 99 mg/dL 882  888  887   BUN 8 - 23  mg/dL 86  78  90   Creatinine 0.61 - 1.24 mg/dL 5.64  5.19  5.46   Sodium 135 - 145 mmol/L 150  149  149   Potassium 3.5 - 5.1 mmol/L 4.3  4.4  4.3   Chloride 98 - 111 mmol/L 121  117  118   CO2 22 - 32 mmol/L 18    19   Calcium  8.9 - 10.3 mg/dL 7.5    7.6      Imaging Results (Last 48 hours)  CT Head Wo Contrast Result Date: 10/08/2023 CLINICAL DATA:  Recent fall with headaches and neck pain, initial encounter EXAM: CT HEAD WITHOUT CONTRAST CT CERVICAL SPINE WITHOUT CONTRAST TECHNIQUE: Multidetector CT imaging of the head and cervical spine was performed following the standard protocol without intravenous contrast. Multiplanar CT image reconstructions of the cervical spine were also generated. RADIATION DOSE REDUCTION: This exam was performed according to the departmental dose-optimization program which includes automated exposure control, adjustment of the mA and/or kV according to patient size and/or use of iterative reconstruction technique. COMPARISON:  09/17/2021 FINDINGS: CT HEAD FINDINGS Brain: There are again noted changes consistent with prior left MCA infarct with considerable encephalomalacia. Atrophic changes and chronic white matter ischemic changes are seen as well. No acute hemorrhage, acute infarction or space-occupying mass lesion is noted. Vascular: No hyperdense vessel or unexpected calcification. Skull: Normal. Negative for fracture or focal lesion. Sinuses/Orbits: No acute finding. Other: None. CT CERVICAL SPINE FINDINGS Alignment: Within normal limits. Skull base and vertebrae: 7 cervical segments are well visualized. Vertebral body height is well maintained. Prior fusion at C6-7 is noted. Multilevel facet hypertrophic changes and osteophytic changes are seen. No acute fracture or acute facet abnormality is noted. Soft tissues and spinal canal: Surrounding soft tissue structures are within normal limits. Upper chest: Visualized lung apices show bilateral pleural effusions which  appear moderate to large worse on the right than the left. Other: None IMPRESSION: CT of the head: No acute intracranial abnormality noted. Changes of prior left MCA infarct. Chronic atrophic and ischemic changes. CT of the cervical spine: Multilevel degenerative change without acute abnormality. Electronically Signed   By: Oneil Devonshire M.D.   On: 10/08/2023 22:35    CT Cervical Spine Wo Contrast Result Date: 10/08/2023 CLINICAL DATA:  Recent fall with headaches and neck pain, initial encounter EXAM: CT HEAD WITHOUT CONTRAST CT CERVICAL SPINE WITHOUT CONTRAST TECHNIQUE: Multidetector CT imaging of the  head and cervical spine was performed following the standard protocol without intravenous contrast. Multiplanar CT image reconstructions of the cervical spine were also generated. RADIATION DOSE REDUCTION: This exam was performed according to the departmental dose-optimization program which includes automated exposure control, adjustment of the mA and/or kV according to patient size and/or use of iterative reconstruction technique. COMPARISON:  09/17/2021 FINDINGS: CT HEAD FINDINGS Brain: There are again noted changes consistent with prior left MCA infarct with considerable encephalomalacia. Atrophic changes and chronic white matter ischemic changes are seen as well. No acute hemorrhage, acute infarction or space-occupying mass lesion is noted. Vascular: No hyperdense vessel or unexpected calcification. Skull: Normal. Negative for fracture or focal lesion. Sinuses/Orbits: No acute finding. Other: None. CT CERVICAL SPINE FINDINGS Alignment: Within normal limits. Skull base and vertebrae: 7 cervical segments are well visualized. Vertebral body height is well maintained. Prior fusion at C6-7 is noted. Multilevel facet hypertrophic changes and osteophytic changes are seen. No acute fracture or acute facet abnormality is noted. Soft tissues and spinal canal: Surrounding soft tissue structures are within normal limits.  Upper chest: Visualized lung apices show bilateral pleural effusions which appear moderate to large worse on the right than the left. Other: None IMPRESSION: CT of the head: No acute intracranial abnormality noted. Changes of prior left MCA infarct. Chronic atrophic and ischemic changes. CT of the cervical spine: Multilevel degenerative change without acute abnormality. Electronically Signed   By: Oneil Devonshire M.D.   On: 10/08/2023 22:35    DG Hip Unilat W or Wo Pelvis 2-3 Views Left Result Date: 10/08/2023 CLINICAL DATA:  trauma EXAM: DG HIP (WITH OR WITHOUT PELVIS) 2-3V LEFT COMPARISON:  September 17, 2021 FINDINGS: Osteopenia.Obliquely oriented, comminuted, and displaced subtrochanteric fracture of the proximal femur.The femoral shaft is posteromedially displaced at least 1 shaft width. The fracture line extends into the lesser trochanter, which is medially displaced 1.6 cm.No dislocation of the hip. Lumbar degenerative disc disease. Extensive aortoiliac and peripheral vascular atherosclerosis. IMPRESSION: Obliquely oriented, displaced subtrochanteric fracture of the proximal femur with involvement of the lesser trochanter, as further described above. If clinically warranted, a dedicated CT of the left hip may be of benefit for preoperative planning. Electronically Signed   By: Rogelia Myers M.D.   On: 10/08/2023 21:03    DG Chest Port 1 View Result Date: 10/08/2023 CLINICAL DATA:  trauma EXAM: PORTABLE CHEST - 1 VIEW COMPARISON:  June 25, 2023 FINDINGS: Bilateral perihilar interstitial opacities throughout both lungs. Moderate volume bilateral pleural effusions. No pneumothorax. Mild cardiomegaly. Sternotomy wires and CABG changes. Tortuous aorta with aortic atherosclerosis. No acute, displaced fracture or destructive lesions. Multilevel thoracic osteophytosis. IMPRESSION: Mild cardiomegaly findings of interstitial edema throughout both lungs. Moderate volume bilateral pleural effusions. Electronically  Signed   By: Rogelia Myers M.D.   On: 10/08/2023 20:58    DG Elbow Complete Left Result Date: 10/08/2023 CLINICAL DATA:  trauma EXAM: LEFT ELBOW - COMPLETE 3+ VIEW COMPARISON:  None Available. FINDINGS: Osteopenia.No acute, displaced fracture or dislocation. Superior elevation of the anterior fat pad. There is no evidence of arthropathy or other focal bone abnormality. Soft tissues are unremarkable. IMPRESSION: Elevated anterior fat pad suggests the presence of an elbow joint effusion. While no acute, displaced fracture is visualized, in the setting of trauma, elevation of the anterior fat pad is worrisome for underlying effusion and occult elbow fracture. Electronically Signed   By: Rogelia Myers M.D.   On: 10/08/2023 20:56    DG Shoulder Left Result Date: 10/08/2023  CLINICAL DATA:  fall, injury EXAM: LEFT SHOULDER - 2+ VIEW COMPARISON:  None Available. FINDINGS: Osteopenia.No acute fracture or dislocation. There is no evidence of arthropathy or other focal bone abnormality. Soft tissues are unremarkable. IMPRESSION: No acute fracture or dislocation. Electronically Signed   By: Rogelia Myers M.D.   On: 10/08/2023 20:54       Family Communication: Discussed with patient's son, daughter yesterday, they understand and agree. All questions answered. He may qualify for inpatient hospice pending ortho intervention.   Disposition: Status is: Inpatient Remains inpatient appropriate because: s/p fall, hip fracture, pain control, supportive care.  Planned Discharge Destination: Skilled nursing facility       IDT: updated   Goals of care: clear, comfort focused care, DNR   Should patient need ambulance transport, please use [facility] as they contract this service for our active hospice patients.   Please call with any hospice questions or concerns.   Greig Basket, BSN, RN [facility] Liaison [phone]

## 2023-10-11 ENCOUNTER — Encounter (HOSPITAL_COMMUNITY): Payer: Self-pay | Admitting: Student

## 2023-10-11 DIAGNOSIS — Z8679 Personal history of other diseases of the circulatory system: Secondary | ICD-10-CM | POA: Diagnosis not present

## 2023-10-11 DIAGNOSIS — W19XXXA Unspecified fall, initial encounter: Secondary | ICD-10-CM | POA: Diagnosis not present

## 2023-10-11 DIAGNOSIS — S72002A Fracture of unspecified part of neck of left femur, initial encounter for closed fracture: Secondary | ICD-10-CM | POA: Diagnosis not present

## 2023-10-11 DIAGNOSIS — R1312 Dysphagia, oropharyngeal phase: Secondary | ICD-10-CM

## 2023-10-11 DIAGNOSIS — E870 Hyperosmolality and hypernatremia: Secondary | ICD-10-CM | POA: Diagnosis not present

## 2023-10-11 LAB — CBC
HCT: 27 % — ABNORMAL LOW (ref 39.0–52.0)
Hemoglobin: 8.9 g/dL — ABNORMAL LOW (ref 13.0–17.0)
MCH: 31.7 pg (ref 26.0–34.0)
MCHC: 33 g/dL (ref 30.0–36.0)
MCV: 96.1 fL (ref 80.0–100.0)
Platelets: 77 K/uL — ABNORMAL LOW (ref 150–400)
RBC: 2.81 MIL/uL — ABNORMAL LOW (ref 4.22–5.81)
RDW: 19.3 % — ABNORMAL HIGH (ref 11.5–15.5)
WBC: 6 K/uL (ref 4.0–10.5)
nRBC: 0 % (ref 0.0–0.2)

## 2023-10-11 LAB — BPAM RBC
Blood Product Expiration Date: 202508142359
Blood Product Expiration Date: 202508272359
Blood Product Expiration Date: 202509062359
Blood Product Expiration Date: 202509132359
ISSUE DATE / TIME: 202508120735
ISSUE DATE / TIME: 202508121505
ISSUE DATE / TIME: 202508131413
ISSUE DATE / TIME: 202508131413
Unit Type and Rh: 6200
Unit Type and Rh: 6200
Unit Type and Rh: 6200
Unit Type and Rh: 9500

## 2023-10-11 LAB — TYPE AND SCREEN
ABO/RH(D): A POS
Antibody Screen: NEGATIVE
Unit division: 0
Unit division: 0
Unit division: 0
Unit division: 0

## 2023-10-11 LAB — BASIC METABOLIC PANEL WITH GFR
Anion gap: 11 (ref 5–15)
BUN: 88 mg/dL — ABNORMAL HIGH (ref 8–23)
CO2: 17 mmol/L — ABNORMAL LOW (ref 22–32)
Calcium: 7.2 mg/dL — ABNORMAL LOW (ref 8.9–10.3)
Chloride: 121 mmol/L — ABNORMAL HIGH (ref 98–111)
Creatinine, Ser: 4.19 mg/dL — ABNORMAL HIGH (ref 0.61–1.24)
GFR, Estimated: 13 mL/min — ABNORMAL LOW (ref >60)
Glucose, Bld: 99 mg/dL (ref 70–99)
Potassium: 4.8 mmol/L (ref 3.5–5.1)
Sodium: 149 mmol/L — ABNORMAL HIGH (ref 135–145)

## 2023-10-11 LAB — VITAMIN D 25 HYDROXY (VIT D DEFICIENCY, FRACTURES): Vit D, 25-Hydroxy: 40.8 ng/mL (ref 30–100)

## 2023-10-11 MED ORDER — SODIUM CHLORIDE 0.45 % IV SOLN: INTRAVENOUS | Status: AC

## 2023-10-11 NOTE — Progress Notes (Addendum)
 Orthopaedic Trauma Progress Note  SUBJECTIVE: Resting comfortably in bed this morning.  Does not talk much during exam but does nod his head and acknowledge pain to the left lower extremity.  No family at bedside currently.  OBJECTIVE:  Vitals:   10/11/23 0513 10/11/23 0753  BP: (!) 134/41 113/63  Pulse: 63 77  Resp: 17 18  Temp: 98.7 F (37.1 C) 97.9 F (36.6 C)  SpO2: 95% 95%    Opiates Today (MME): Today's  total administered Morphine  Milligram Equivalents: 0 Opiates Yesterday (MME): Yesterday's total administered Morphine  Milligram Equivalents: 30  General: Resting comfortably.  Respiratory: No increased work of breathing.  Operative Extremity (LLE): Dressings CDI.  Some soreness with palpation over the hip and throughout the thigh as expected.  Compartments are soft compressible.  No obvious calf tenderness.  Tolerates gentle motion of the ankle and toes.  Foot warm and well-perfused. + DP pulse  IMAGING: Stable post op imaging.   LABS:  Results for orders placed or performed during the hospital encounter of 10/08/23 (from the past 24 hours)  CBC     Status: Abnormal   Collection Time: 10/10/23  9:26 AM  Result Value Ref Range   WBC 6.5 4.0 - 10.5 K/uL   RBC 2.31 (L) 4.22 - 5.81 MIL/uL   Hemoglobin 7.7 (L) 13.0 - 17.0 g/dL   HCT 76.8 (L) 60.9 - 47.9 %   MCV 100.0 80.0 - 100.0 fL   MCH 33.3 26.0 - 34.0 pg   MCHC 33.3 30.0 - 36.0 g/dL   RDW 80.9 (H) 88.4 - 84.4 %   Platelets 102 (L) 150 - 400 K/uL   nRBC 0.0 0.0 - 0.2 %  Basic metabolic panel with GFR     Status: Abnormal   Collection Time: 10/10/23  9:26 AM  Result Value Ref Range   Sodium 150 (H) 135 - 145 mmol/L   Potassium 4.3 3.5 - 5.1 mmol/L   Chloride 121 (H) 98 - 111 mmol/L   CO2 18 (L) 22 - 32 mmol/L   Glucose, Bld 117 (H) 70 - 99 mg/dL   BUN 86 (H) 8 - 23 mg/dL   Creatinine, Ser 5.64 (H) 0.61 - 1.24 mg/dL   Calcium  7.5 (L) 8.9 - 10.3 mg/dL   GFR, Estimated 12 (L) >60 mL/min   Anion gap 11 5 - 15   Blood transfusion report - scanned     Status: None ()   Collection Time: 10/10/23 11:36 AM   Narrative   Ordered by an unspecified provider.  Blood transfusion report - scanned     Status: None   Collection Time: 10/10/23 11:36 AM   Narrative   Ordered by an unspecified provider.  Prepare RBC (crossmatch) INTRAOP ONLY     Status: None   Collection Time: 10/10/23  1:30 PM  Result Value Ref Range   Order Confirmation      ORDER PROCESSED BY BLOOD BANK Performed at [facility], [address] [place], [place] [address]   I-STAT, [place] 8     Status: Abnormal   Collection Time: 10/10/23  2:13 PM  Result Value Ref Range   Sodium 150 (H) 135 - 145 mmol/L   Potassium 5.2 (H) 3.5 - 5.1 mmol/L   Chloride 119 (H) 98 - 111 mmol/L   BUN 96 (H) 8 - 23 mg/dL   Creatinine, Ser 5.59 (H) 0.61 - 1.24 mg/dL   Glucose, Bld 889 (H) 70 - 99 mg/dL   Calcium , Ion  0.96 (L) 1.15 - 1.40 mmol/L   TCO2 19 (L) 22 - 32 mmol/L   Hemoglobin 6.8 (LL) 13.0 - 17.0 g/dL   HCT 79.9 (L) 60.9 - 47.9 %   Comment NOTIFIED PHYSICIAN   I-STAT, chem 8     Status: Abnormal   Collection Time: 10/10/23  2:19 PM  Result Value Ref Range   Sodium 152 (H) 135 - 145 mmol/L   Potassium 4.3 3.5 - 5.1 mmol/L   Chloride 117 (H) 98 - 111 mmol/L   BUN 79 (H) 8 - 23 mg/dL   Creatinine, Ser 5.69 (H) 0.61 - 1.24 mg/dL   Glucose, Bld 888 (H) 70 - 99 mg/dL   Calcium , Ion 1.02 (L) 1.15 - 1.40 mmol/L   TCO2 20 (L) 22 - 32 mmol/L   Hemoglobin 7.5 (L) 13.0 - 17.0 g/dL   HCT 77.9 (L) 60.9 - 47.9 %  CBC     Status: Abnormal   Collection Time: 10/11/23  7:01 AM  Result Value Ref Range   WBC 6.0 4.0 - 10.5 K/uL   RBC 2.81 (L) 4.22 - 5.81 MIL/uL   Hemoglobin 8.9 (L) 13.0 - 17.0 g/dL   HCT 72.9 (L) 60.9 - 47.9 %   MCV 96.1 80.0 - 100.0 fL   MCH 31.7 26.0 - 34.0 pg   MCHC 33.0 30.0 - 36.0 g/dL   RDW 80.6 (H) 88.4 - 84.4 %   Platelets 77 (L) 150 - 400 K/uL   nRBC 0.0 0.0 - 0.2 %  Basic metabolic panel with GFR     Status:  Abnormal   Collection Time: 10/11/23  7:01 AM  Result Value Ref Range   Sodium 149 (H) 135 - 145 mmol/L   Potassium 4.8 3.5 - 5.1 mmol/L   Chloride 121 (H) 98 - 111 mmol/L   CO2 17 (L) 22 - 32 mmol/L   Glucose, Bld 99 70 - 99 mg/dL   BUN 88 (H) 8 - 23 mg/dL   Creatinine, Ser 5.80 (H) 0.61 - 1.24 mg/dL   Calcium  7.2 (L) 8.9 - 10.3 mg/dL   GFR, Estimated 13 (L) >60 mL/min   Anion gap 11 5 - 15  VITAMIN D  25 Hydroxy (Vit-D Deficiency, Fractures)     Status: None   Collection Time: 10/11/23  7:01 AM  Result Value Ref Range   Vit D, 25-Hydroxy 40.80 30 - 100 ng/mL    ASSESSMENT: [name] is a 89 y.o. male, 1 Day Post-Op s/p fall at home Procedures: INTRAMEDULLARY NAILING LEFT INTERTROCHANTERIC/SUBTROCHANTERIC FEMUR FRACTURE  CV/Blood loss: Acute blood loss anemia, Hgb 8.9 this AM.  Received 2 units PRBCs on 10/10/2023.  Hemodynamically stable  PLAN: Weightbearing: WBAT LLE ROM: Okay for hip and knee motion as tolerated Incisional and dressing care: Reinforce dressings as needed  Showering: Okay to begin getting incisions wet 10/13/2023 Orthopedic device(s): None  Pain management:  1. Tylenol  650 mg q 6 hours PRN 2. Tramadol  50 mg q 6 hours PRN 3. Morphine  0.5 mg q 2 hours PRN VTE prophylaxis: Hold restarting Eliquis  until hemoglobin stable.  Continue SCDs ID:  Ancef  2gm post op Foley/Lines:  No foley, KVO IVFs Impediments to Fracture Healing: Vitamin D  level 40, no supplementation needed Dispo: PT/OT evaluation today.  Continue to monitor CBC.  Recommend holding restarting home dose Eliquis  until hemoglobin is stable.  Will plan to remove dressings LLE tomorrow 10/12/2023   D/C recommendations: - Tylenol  as primary pain control.  Possible need for tramadol   -  Home dose Eliquis  for DVT prophylaxis - No additional need for Vit D supplementation  Follow - up plan: 2 weeks after d/c for wound check and repeat x-rays   Contact information:  Franky Light MD, Lauraine Moores  PA-C. After hours and holidays please check [email or web address] for group call information for [facility]   Lauraine PATRIC Moores, PA-C [phone] (office) [email or web address]

## 2023-10-11 NOTE — Care Management Important Message (Signed)
 Important Message  Patient Details  Name: [name] MRN: [id number] Date of Birth: 1928/05/25   Important Message Given:  Yes - Medicare IM     Jon Cruel 10/11/2023, 12:10 PM

## 2023-10-11 NOTE — Progress Notes (Signed)
 Fracture Care Post-op Anticoagulation Consult Note  Brief Assessment: s/p CM nailing of left femur fracture. Pharmacy consulted to resume apixaban  if POD 1 Hgb >=9.  Hgb is 8.9 and platelets are 77. No overt bleeding noted.  Plan: Hold resuming apixaban  today per discussion with Lauraine Moores, PA Follow-up Hgb in am  Thank you for involving pharmacy in this patient's care.  Delon Sax, PharmD, BCPS Clinical Pharmacist Clinical phone for 10/11/2023 is [phone] 10/11/2023 11:14 AM

## 2023-10-11 NOTE — Progress Notes (Signed)
 [facility] Liaison Note   [name] is a current patient with [facility] with a terminal diagnosis of hypertensive heart disease with heart failure. Patient was admitted 10/08/2023 after a fall at his ALF. Per Dr. Todd Newcomer with [facility] this is a related hospital admission. Patient is a DNR.   Visited with patient and son at bedside. Patient was resting comfortably. Per son, patient was able to eat a meal today, work with PT a bit, and has been without complaint post-surgery.  Report exchanged with bedside nurse. Patient now exhibiting some throat congestion/secretions for which RN has requested assistance with.   Patient remains inpatient appropriate for skilled monitoring, assessment and medication titration for complex symptom management post-surgery.   V/S: 97.9/77/18   113/63   95% on 2 lpm   I/O: 1,276.6/ 550   Abnormal Labs:  10/11/23  Sodium: 149 (H) Chloride: 121 (H) CO2: 17 (L) BUN: 88 (H) Creatinine: 4.19 (H) Calcium : 7.2 (L) GFR, Estimated: 13 (L) RBC: 2.81 (L) Hemoglobin: 8.9 (L) HCT: 27.0 (L) RDW: 19.3 (H) Platelets: 77 (L)  Diagnostics: none new   IV/PRN medications: Tramadol  50 mg PO x1, Cyklokapron  1,000 mg IV x1, Cefazolin  2g IV q 8hrs   Assessment and Plan per Darci Pore, MD 8.13.25: Acute displaced subtrochanteric fracture of proximal left femur- Status post mechanical fall Continue pain control, supportive care. His hemoglobin 5.6, received total 2 units of blood, this am Hb 7.7. last dose Eliquis  8/11 morning. High risk due to his age, comorbidities. Ortho surgery for palliative intent. Orthopedic team plan for possible ORIF procedure today.   Acute on CKD stage IV: Continue gentle IV hydration. Avoid nephrotoxic drugs. UA unremarkable for infection. Monitor daily renal function.   Acute on chronic anemia- Patient's baseline hemoglobin ranges 9-10. His hemoglobin 5.6,  received total 2 units of blood, this am Hb 7.7. Discussed with family to stop Eliquis  upon discharge. Follow h/h and transfuse for Hb < 7.   Hypovolemic hypernatremia- In the setting of poor oral intake. Continue gentle IV fluids 1/2NS at 75ml/hr. Trend sodium. Monitor strict input and output.   Persistent A-fib: EKG shows Afib rate controlled. Hold home Eliquis  due to drop in Hb.     Essential hypertension: Hold oral meds as he is more sleepy. IV hydralazine  as needed for elevated blood pressures.   Hyperlipidemia: Resume statin upon discharge.   Depression: On Lexapro  PTA.   Hospice patient on [facility]- Patient from independent living facility under hospice care.  His overall medical condition deteriorating as per family since few weeks. Hospice team to follow him while inpatient. Patient may need hospice at facility upon discharge.  Discharge Plan: return to independent living apartment with the support of hospice services.    Family contact: Spoke with son Norleen at bedside   IDT: updated   Goals of care: clear, comfort focused care, DNR   Should patient need ambulance transport, please use [facility] as they contract this service for our active hospice patients.   Please call with any hospice questions or concerns.   Eleanor Nail, LPN [facility] Liaison [phone]

## 2023-10-11 NOTE — Progress Notes (Signed)
 Progress Note   Patient: [name] FMW:[id number] DOB: 1928-04-22 DOA: 10/08/2023     3 DOS: the patient was seen and examined on 10/11/2023   Brief hospital course: [name] is a 89 y.o. male with medical history significant for ischemic stroke with residual expressive aphasia, nonverbal at baseline, residual right hemiparesis, persistent atrial fibrillation chronically anticoagulated on Eliquis , anemia of chronic disease baseline hemoglobin 9-10, CKD stage IV with baseline creatinine 3.2-3.4, essential hypertension, chronic right-sided systolic heart failure, who is admitted to [facility] on 10/08/2023 with acute left hip fracture after presenting from snf to [facility] ED for evaluation of fall.   Patient sustained acute left hip fracture, laboratory workup notable for acute kidney injury, hyponatremia, acute on chronic anemia admitted to [facility] service for further management evaluation, orthopedic consulted.  S/p Cephalomedullary nailing of left intertrochanteric femur fracture 10/10/23.  Assessment and Plan: Acute displaced subtrochanteric fracture of proximal left femur- Status post mechanical fall 8/13 s/p Cephalomedullary nailing of left intertrochanteric femur fracture  Continue pain control, supportive care. Monitor post op Hb.  Acute on CKD stage IV: Continue gentle IV hydration. Avoid nephrotoxic drugs. UA unremarkable for infection. Monitor daily renal function.  Acute on chronic anemia- Patient's baseline hemoglobin ranges 9-10. His hemoglobin 5.6, received total 2 units of blood, this am Hb 7.7. Discussed with family to stop Eliquis  upon discharge. Follow h/h and transfuse for Hb < 7.  Hypovolemic hypernatremia- In the setting of poor oral intake. NPO due to dysphagia. Continue gentle IV fluids 1/2NS at 75ml/hr. Trend sodium. Monitor strict input and output.  Dysphagia- SLP advised NPO. Hold oral meds. MBS to be scheduled. Patient family refused PEG  tube.  Persistent A-fib: EKG shows Afib rate controlled. Hold home Eliquis  due to drop in Hb.    Essential hypertension: Hold oral meds as he is more sleepy. IV hydralazine  as needed for elevated blood pressures.  Hyperlipidemia: Resume statin upon discharge.  Depression: On Lexapro  PTA.  Hospice patient on [facility]- Patient from independent living facility under hospice care.  His overall medical condition deteriorating as per family since few weeks. Hospice team to follow him while inpatient. Patient may need hospice at facility upon discharge.      Nursing supportive care. Fall, aspiration precautions. Diet:  Diet Orders (From admission, onward)     Start     Ordered   10/09/23 1025  Diet NPO time specified Except for: Sips with Meds, Ice Chips  Diet effective now       Question Answer Comment  Except for Sips with Meds   Except for Ice Chips      10/09/23 1025           DVT prophylaxis: SCDs Start: 10/10/23 1805 SCDs Start: 10/08/23 2307  Level of care: Telemetry Medical   Code Status: Limited: Do not attempt resuscitation (DNR) -DNR-LIMITED -Do Not Intubate/DNI   Subjective: Patient is seen and examined today morning. He is sleeping comfortably. RN noted he has trouble swallowing.    Physical Exam: Vitals:   10/11/23 0513 10/11/23 0753 10/11/23 1551 10/11/23 1955  BP: (!) 134/41 113/63 (!) 128/55 (!) 132/54  Pulse: 63 77 (!) 53 (!) 52  Resp: 17 18 16 16   Temp: 98.7 F (37.1 C) 97.9 F (36.6 C) 99.6 F (37.6 C) 98.2 F (36.8 C)  TempSrc: Axillary Axillary Oral Oral  SpO2: 95% 95% 100% 93%  Weight:      Height:        General -  Elderly ill looking Caucasian male, confused, no distress HEENT - PERRLA, EOMI, atraumatic head, non tender sinuses. Lung - Clear, basal rales, rhonchi, no wheezes. Heart - S1, S2 heard, no murmurs, rubs, 1+ pedal edema. Abdomen - Soft, non tender, bowel sounds good Neuro - sleeping, unable to do full neuro  exam. Skin - Warm and dry. Chronic leg skin changes.  Data Reviewed:      Latest Ref Rng & Units 10/11/2023    7:01 AM 10/10/2023    2:19 PM 10/10/2023    2:13 PM  CBC  WBC 4.0 - 10.5 K/uL 6.0     Hemoglobin 13.0 - 17.0 g/dL 8.9  7.5  6.8   Hematocrit 39.0 - 52.0 % 27.0  22.0  20.0   Platelets 150 - 400 K/uL 77         Latest Ref Rng & Units 10/11/2023    7:01 AM 10/10/2023    2:19 PM 10/10/2023    2:13 PM  BMP  Glucose 70 - 99 mg/dL 99  888  889   BUN 8 - 23 mg/dL 88  79  96   Creatinine 0.61 - 1.24 mg/dL 5.80  5.69  5.59   Sodium 135 - 145 mmol/L 149  152  150   Potassium 3.5 - 5.1 mmol/L 4.8  4.3  5.2   Chloride 98 - 111 mmol/L 121  117  119   CO2 22 - 32 mmol/L 17     Calcium  8.9 - 10.3 mg/dL 7.2      DG FEMUR PORT MIN 2 VIEWS LEFT Result Date: 10/10/2023 CLINICAL DATA:  Fracture, postop. EXAM: LEFT FEMUR PORTABLE 2 VIEWS COMPARISON:  Preoperative imaging FINDINGS: Femoral intramedullary nail with trans trochanteric and distal locking screw fixation traverse proximal femur fracture. Improved fracture alignment from preoperative imaging. Persistent displacement of the lesser trochanteric component. Recent postsurgical change includes air and edema in the soft tissues. Vascular calcifications are seen. IMPRESSION: ORIF of proximal femur fracture, in improved alignment from preoperative imaging. Electronically Signed   By: Andrea Gasman M.D.   On: 10/10/2023 20:40   DG FEMUR MIN 2 VIEWS LEFT Result Date: 10/10/2023 CLINICAL DATA:  Elective surgery.  Fracture fixation. EXAM: LEFT FEMUR 2 VIEWS COMPARISON:  Radiograph 10/08/2023 FINDINGS: Six fluoroscopic spot views of the femur submitted from the operating room. Femoral intramedullary nail with trans trochanteric and distal locking screw fixation traverse comminuted proximal femur fracture. Fluoroscopy time 1 minutes 31 seconds. Dose 15.14 mGy. IMPRESSION: Intraoperative fluoroscopy during femur fracture fixation. Electronically  Signed   By: Andrea Gasman M.D.   On: 10/10/2023 17:35   DG C-Arm 1-60 Min-No Report Result Date: 10/10/2023 Fluoroscopy was utilized by the requesting physician.  No radiographic interpretation.   DG C-Arm 1-60 Min-No Report Result Date: 10/10/2023 Fluoroscopy was utilized by the requesting physician.  No radiographic interpretation.    Family Communication: no family at bedside. He may qualify for inpatient hospice.  Disposition: Status is: Inpatient Remains inpatient appropriate because: s/p orif, dysphagia eval, pain control, supportive care.  Planned Discharge Destination: Skilled nursing facility     Time spent: 43 minutes  Author: Concepcion Riser, MD 10/11/2023 9:07 PM Secure chat 7am to 7pm For on call review [email or web address].

## 2023-10-11 NOTE — Evaluation (Signed)
 Occupational Therapy Evaluation Patient Details Name: [name] MRN: [id number] DOB: 1928/09/16 Today's Date: 10/11/2023   History of Present Illness   Pt is a 89 y/o male who presents 10/08/2023 s/p fall. He is now s/p cephalomedullary nailing of left intertrochanteric femur fracture on 10/10/2023. PMH significant for CVA with residual R side deficits, HTN, a-fib, pulmonary HTN, tricuspid regurgitation, back surgery, abdominal surgery, CABG x5, hernia repair.     Clinical Impressions At baseline, pt completes ADLs largely Mod I to Supervision and functional mobility short distances with a Rollator with Mod I. At baseline, pt receives assistance from family and ALF staff for IADLs. Pt is largely nonverbal at baseline. Pt now presents with decreased activity tolerance, generalized B UE weakness, impaired B UE coordination, decreased balance, pain affecting functional level, impaired ability to follow 1-step instructions, and decreased safety and independence with functional tasks. Pt currently demonstrates ability to complete ADLs largely with Min to Total assist +2 and bed mobility with Total assist +2. Pt VSS on 2L continuous O2 through nasal cannula throughout session. Pt will benefit from acute skilled OT services to address deficits outlined below and to increase safety and independence with functional tasks. Post acute discharge, pt will benefit from intensive inpatient skilled rehab services < 3 hours per day to decrease fall risk, decrease caregiver burden, and increase safety and independence with functional tasks.     If plan is discharge home, recommend the following:   Two people to help with walking and/or transfers;Two people to help with bathing/dressing/bathroom;Assistance with cooking/housework;Assistance with feeding;Direct supervision/assist for medications management;Direct supervision/assist for financial management;Assist for transportation;Help with stairs or ramp for  entrance;Supervision due to cognitive status     Functional Status Assessment   Patient has had a recent decline in their functional status and demonstrates the ability to make significant improvements in function in a reasonable and predictable amount of time.     Equipment Recommendations   Other (comment) (defer to next level of care)     Recommendations for Other Services         Precautions/Restrictions   Precautions Precautions: Fall Recall of Precautions/Restrictions: Intact Precaution/Restrictions Comments: Essentially non-verbal at baseline Restrictions Weight Bearing Restrictions Per Provider Order: Yes LLE Weight Bearing Per Provider Order: Weight bearing as tolerated     Mobility Bed Mobility Overal bed mobility: Needs Assistance Bed Mobility: Supine to Sit, Sit to Supine     Supine to sit: Total assist, +2 for physical assistance Sit to supine: Total assist, +2 for physical assistance   General bed mobility comments: Heavy +2 assist required for all aspects of bed mobility. Hand over hand assist to reah for R railing with LUE. Pt pulling on rail but still required gross +2 total assist to helicopter around to EOB.    Transfers                   General transfer comment: Unable to progress to OOB at this time.      Balance Overall balance assessment: Needs assistance Sitting-balance support: Feet supported, Bilateral upper extremity supported, Single extremity supported Sitting balance-Leahy Scale: Zero Sitting balance - Comments: Max assist at times. Postural control: Posterior lean                                 ADL either performed or assessed with clinical judgement   ADL Overall ADL's : Needs assistance/impaired Eating/Feeding: Bed level;Minimal assistance (  with HOB elevated) Eating/Feeding Details (indicate cue type and reason): Pt eating with assist of RN and NT upon arrival. Pt with noted coughing during  eating/drinking. RN reports she will follow up with MD> Grooming: Oral care;Moderate assistance;Cueing for sequencing;Sitting;Minimal assistance;Cueing for compensatory techniques Grooming Details (indicate cue type and reason): Min-Mod assist and Mod-Max cues to perform oral care with use of mouth swab sitting EOB with Max assist to maintain sitting balance Upper Body Bathing: Maximal assistance;Bed level;Cueing for sequencing;Cueing for compensatory techniques   Lower Body Bathing: Total assistance;Bed level;+2 for physical assistance;+2 for safety/equipment   Upper Body Dressing : Moderate assistance;Bed level;Cueing for sequencing;Cueing for compensatory techniques   Lower Body Dressing: Total assistance;+2 for safety/equipment;+2 for physical assistance;Bed level     Toilet Transfer Details (indicate cue type and reason): unable at this time Toileting- Architect and Hygiene: Total assistance;+2 for physical assistance;+2 for safety/equipment;Bed level         General ADL Comments: Pt with decreased activity tolerance, pain, and impaired ability to follow 1-step commands affecting funcitonal level.     Vision Baseline Vision/History: 1 Wears glasses Patient Visual Report: Other (comment) (pt unable to report) Vision Assessment?: Vision impaired- to be further tested in functional context Additional Comments: Pt unable to follow commands for vision screen or to report any recent changes in vision. Vision appears largely [facility] for tasks assessed. OT to further assess vision during funcitonal tasks in future sessions.     Perception         Praxis         Pertinent Vitals/Pain Pain Assessment Pain Assessment: Faces Faces Pain Scale: Hurts even more Pain Location: LLE Pain Descriptors / Indicators: Operative site guarding, Sore, Grimacing, Moaning Pain Intervention(s): Limited activity within patient's tolerance, Monitored during session, Repositioned, RN gave pain  meds during session     Extremity/Trunk Assessment Upper Extremity Assessment Upper Extremity Assessment: RUE deficits/detail;LUE deficits/detail;Generalized weakness RUE Deficits / Details: Right hemiparesis at baseline secondary to prior CVA; AAROM shoulder flexion to approximately 70 degrees; AAROM elbow extension to approximately 40 degrees; other AROM largely WFL; decreased coordination; generalized weakness RUE Coordination: decreased fine motor;decreased gross motor LUE Deficits / Details: generalized weakness; mildly decreased AROM shoulder flexion with all other ROM WFL; decreased coordination LUE Coordination: decreased fine motor;decreased gross motor   Lower Extremity Assessment Lower Extremity Assessment: Defer to PT evaluation RLE Deficits / Details: Generalized weakness, unable to demonstrate full knee extension against gravity. Minimal active movement noted of RLE during bed mobility. LLE Deficits / Details: Acute pain, decreased strength and AROM consistent with above mentioned surgery.  No active movement noted of LLE throughout session. Pt grabbing L hip with L hand due to pain   Cervical / Trunk Assessment Cervical / Trunk Assessment: Kyphotic Cervical / Trunk Exceptions: Forward head posture with rounded shoulders   Communication Communication Communication: Impaired Factors Affecting Communication: Difficulty expressing self   Cognition Arousal: Alert Behavior During Therapy: Flat affect Cognition: Difficult to assess Difficult to assess due to: Impaired communication (Pt largely nonverbal at baseline secondary to prior CVA. Pt able to inconsistently answer yes/no questions with head nods/shakes or minimal verbalizations. Inconsistently able to follow 1-step instructions. Oriented to self/name and oriented to son.)                             Following commands: Impaired Following commands impaired: Follows one step commands inconsistently, Follows one  step commands with increased  time     Cueing  General Comments   Cueing Techniques: Verbal cues;Gestural cues;Tactile cues;Visual cues  VSS on 2L continuous O2 through nasal cannula throughout session. RN and NT present at beginning of session. Pt's son arriving mid-session and providing pt's PLOF. Son very supportive with pt and son very happy to see each other.   Exercises     Shoulder Instructions      Home Living Family/patient expects to be discharged to:: Assisted living                                 Additional Comments: Per chart review pt is from the [facility]      Prior Functioning/Environment Prior Level of Function : Needs assist             Mobility Comments: Using rollator, able to ambulate short distances with Mod I ADLs Comments: Able to complete ADLs largely Mod I to Supervision    OT Problem List: Decreased strength;Decreased activity tolerance;Impaired balance (sitting and/or standing);Decreased coordination;Decreased knowledge of use of DME or AE;Decreased knowledge of precautions;Impaired UE functional use;Pain   OT Treatment/Interventions: Self-care/ADL training;Therapeutic exercise;DME and/or AE instruction;Therapeutic activities;Cognitive remediation/compensation;Patient/family education;Balance training      OT Goals(Current goals can be found in the care plan section)   Acute Rehab OT Goals Patient Stated Goal: pt unable to state; so would like for pt to be able to perform funcitonal mobility short distances with Rollator again OT Goal Formulation: With patient/family Time For Goal Achievement: 10/25/23 Potential to Achieve Goals: Fair ADL Goals Pt Will Perform Grooming: with set-up;with supervision;sitting (sitting EOB with Fair balance for 3 or more minutes) Pt Will Perform Upper Body Bathing: with min assist;sitting Pt Will Perform Upper Body Dressing: with contact guard assist;sitting Pt Will Transfer to Toilet: with  mod assist;bedside commode (step-pivot transfer; with least restrictive AD) Additional ADL Goal #1: Patient will demonstrate ability to accurately follow 1-step commands in 4/5 opportunities to increase safety and independence during functional tasks.   OT Frequency:  Min 2X/week    Co-evaluation PT/OT/SLP Co-Evaluation/Treatment: Yes Reason for Co-Treatment: Complexity of the patient's impairments (multi-system involvement);Necessary to address cognition/behavior during functional activity;For patient/therapist safety;To address functional/ADL transfers PT goals addressed during session: Balance;Proper use of DME;Strengthening/ROM;Mobility/safety with mobility OT goals addressed during session: ADL's and self-care;Strengthening/ROM      AM-PAC OT 6 Clicks Daily Activity     Outcome Measure Help from another person eating meals?: A Little Help from another person taking care of personal grooming?: A Lot Help from another person toileting, which includes using toliet, bedpan, or urinal?: Total Help from another person bathing (including washing, rinsing, drying)?: A Lot Help from another person to put on and taking off regular upper body clothing?: A Lot Help from another person to put on and taking off regular lower body clothing?: Total 6 Click Score: 11   End of Session Equipment Utilized During Treatment: Oxygen Nurse Communication: Mobility status;Other (comment) (Discussed pt coughing with food/drink)  Activity Tolerance: Patient tolerated treatment well;Patient limited by fatigue;Patient limited by pain;Other (comment) (Limited by current cognitive deficits.) Patient left: in bed;with call bell/phone within reach;with bed alarm set;with family/visitor present  OT Visit Diagnosis: Other abnormalities of gait and mobility (R26.89);History of falling (Z91.81);Muscle weakness (generalized) (M62.81);Ataxia, unspecified (R27.0);Other symptoms and signs involving cognitive function;Pain                 Time: 1005-1031 OT  Time Calculation (min): 26 min Charges:  OT General Charges $OT Visit: 1 Visit OT Evaluation $OT Eval Moderate Complexity: 1 Mod  Margarie Rockey HERO., OTR/L, MA Acute Rehab [phone]   Margarie FORBES Horns 10/11/2023, 1:37 PM

## 2023-10-11 NOTE — Evaluation (Signed)
 Clinical/Bedside Swallow Evaluation Patient Details  Name: [name] MRN: [id number] Date of Birth: August 10, 1928  Today's Date: 10/11/2023 Time: SLP Start Time (ACUTE ONLY): 1347 SLP Stop Time (ACUTE ONLY): 1440 SLP Time Calculation (min) (ACUTE ONLY): 53 min  Past Medical History:  Past Medical History:  Diagnosis Date   Actinic keratosis    BPH with urinary obstruction    Bradycardia    CAD (coronary artery disease)    CABG x5   Cerebrovascular accident (stroke) (HCC)    With right hemiparesis and persistent expressive aphasia   GERD (gastroesophageal reflux disease)    History of hydronephrosis    Hx of hydronephrosis 07/08/2011   right    Hyperlipidemia    Hypertension    Iron deficiency anemia due to chronic blood loss 03/26/2007   Now resolved     Iron deficiency anemia secondary to blood loss (chronic)    Persistent atrial fibrillation (HCC)    Pulmonary hypertension (HCC)    by echo 2013   Rosacea    Situational depression    Severe   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   treated after biopsy   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   KA - treated after biopsy   Tricuspid regurgitation    Past Surgical History:  Past Surgical History:  Procedure Laterality Date   ABDOMINAL SURGERY     BACK SURGERY     BAND HEMORRHOIDECTOMY     CHOLECYSTECTOMY     CORONARY ARTERY BYPASS GRAFT     x 5   dental implants     HERNIA REPAIR     INTRAMEDULLARY (IM) NAIL INTERTROCHANTERIC Left 10/10/2023   Procedure: FIXATION, FRACTURE, INTERTROCHANTERIC, WITH INTRAMEDULLARY ROD, LEFT;  Surgeon: Kendal Franky SQUIBB, MD;  Location: [facility] OR;  Service: Orthopedics;  Laterality: Left;   KIDNEY SURGERY     stenosis with stent   TONSILLECTOMY     HPI:  89yo male admitted from [facility] 10/08/23 after a fall with left hip fx. Surgery 10/10/23. PMH: ischemic stroke with residual expressive aphasia, nonverbal at baseline, right hemi. AFib, chronic  anemia, CKD4, HTN,right sHF.    Assessment / Plan / Recommendation  Clinical Impression  Pt presents with high risk of aspiration. Pt awake and cooperative. Daughter present. Suction was set up to facilitate thorough oral care. Pt's upper and lower dentures were removed and cleaned. Oral care completed with suction. Dentures replaced in oral cavity prior to PO presentation. Pt unable to follow verbal commands due to aphasia and HOH. He is nonverbal at baseline. Intermittent vocalization was clear (not wet).   Following oral care, pt accepted limited trials of ice chips and puree texture. Poor lip closure and minimal oral manipulation with oral residue and anterior leakage observed. Swallow reflex appeared discoordinated and was audible. Delayed cough/throat clear noted after presentations. Laryngeal elevation adequate per palpation.   At this time, NPO status with consideration of MBS is recommended to determine least restrictive PO diet. Family does not want PEG placement. Pt's daughter wants to talk with her brothers and make the decision whether to proceed with MBS or allow pt full range of PO choices with known aspiration risk.  ST will follow up in the morning for their decision. RN/MD informed of results and recommendations.  SLP Visit Diagnosis: Dysphagia, unspecified (R13.10)    Aspiration Risk  Severe aspiration risk;Risk for inadequate nutrition/hydration    Diet Recommendation NPO    Medication Administration:  Via alternative means    Other  Recommendations Oral Care Recommendations: Oral care QID Caregiver Recommendations: Have oral suction available     Assistance Recommended at Discharge  Full assist/supervision  Functional Status Assessment Patient has had a recent decline in their functional status and/or demonstrates limited ability to make significant improvements in function in a reasonable and predictable amount of time   Frequency and Duration  (pending MBS)           Prognosis Prognosis for improved oropharyngeal function: Guarded Barriers to Reach Goals: Language deficits;Severity of deficits      Swallow Study   General Date of Onset: 10/08/23 HPI: 89yo male admitted from [facility] 10/08/23 after a fall with left hip fx. Surgery 10/10/23. PMH: ischemic stroke with residual expressive aphasia, nonverbal at baseline, right hemi. AFib, chronic anemia, CKD4, HTN,right sHF. Type of Study: Bedside Swallow Evaluation Previous Swallow Assessment: none Diet Prior to this Study: NPO Temperature Spikes Noted: No Respiratory Status: Nasal cannula History of Recent Intubation: Yes Total duration of intubation (days): 1 days (intubated for surgery) Date extubated: 10/10/23 Behavior/Cognition: Alert;Cooperative;Doesn't follow directions;Requires cueing Oral Cavity Assessment: Within Functional Limits Oral Care Completed by SLP: Yes Oral Cavity - Dentition: Dentures, bottom;Dentures, top Self-Feeding Abilities: Total assist Patient Positioning: Upright in bed Baseline Vocal Quality: Normal Volitional Cough: Cognitively unable to elicit Volitional Swallow: Unable to elicit    Oral/Motor/Sensory Function Overall Oral Motor/Sensory Function: Other (comment) (unable to assess due to aphasia and HOH)   Ice Chips Ice chips: Impaired Presentation: Spoon Oral Phase Impairments: Reduced labial seal;Reduced lingual movement/coordination;Impaired mastication;Poor awareness of bolus Oral Phase Functional Implications: Right anterior spillage;Prolonged oral transit;Oral holding Pharyngeal Phase Impairments: Suspected delayed Swallow;Cough - Delayed;Multiple swallows;Throat Clearing - Delayed   Thin Liquid Thin Liquid: Not tested    Nectar Thick Nectar Thick Liquid: Not tested   Honey Thick Honey Thick Liquid: Not tested   Puree Puree: Impaired Presentation: Spoon Oral Phase Impairments: Reduced labial seal;Impaired mastication;Reduced lingual movement/coordination;Poor  awareness of bolus Oral Phase Functional Implications: Oral residue;Prolonged oral transit;Oral holding Pharyngeal Phase Impairments: Suspected delayed Swallow;Throat Clearing - Delayed;Cough - Delayed;Multiple swallows   Solid     Solid: Not tested     [name], MSP, CCC-SLP Speech Language Pathologist Office: [phone]  [name] Caprice Daring 10/11/2023,2:55 PM

## 2023-10-11 NOTE — Evaluation (Signed)
 Physical Therapy Evaluation  Patient Details Name: [name] MRN: [id number] DOB: October 10, 1928 Today's Date: 10/11/2023  History of Present Illness  Pt is a 89 y/o male who presents 10/08/2023 s/p fall. He is now s/p cephalomedullary nailing of left intertrochanteric femur fracture on 10/10/2023. PMH significant for CVA with residual R side deficits, HTN, a-fib, pulmonary HTN, tricuspid regurgitation, back surgery, abdominal surgery, CABG x5, hernia repair.   Clinical Impression  Pt admitted with above diagnosis. Pt currently with functional limitations due to the deficits listed below (see PT Problem List). At the time of PT eval pt was able to perform transfer to/from EOB with +2 total assist, and required up to max assist for sitting balance EOB. No active movement of LLE noted throughout functional mobility attempts. Pt will benefit from acute skilled PT to increase their independence and safety with mobility to allow discharge.           If plan is discharge home, recommend the following: Two people to help with walking and/or transfers;Two people to help with bathing/dressing/bathroom;Assistance with cooking/housework;Assist for transportation   Can travel by private vehicle   No    Equipment Recommendations Rolling walker (2 wheels) (Unsafe to attempt with rollator at this time. Unsure if pt has 2-wheeled RW)  Recommendations for Other Services       Functional Status Assessment Patient has had a recent decline in their functional status and demonstrates the ability to make significant improvements in function in a reasonable and predictable amount of time.     Precautions / Restrictions Precautions Precautions: Fall Recall of Precautions/Restrictions: Intact Precaution/Restrictions Comments: Essentially non-verbal at baseline Restrictions Weight Bearing Restrictions Per Provider Order: Yes LLE Weight Bearing Per Provider Order: Weight bearing as tolerated      Mobility   Bed Mobility Overal bed mobility: Needs Assistance Bed Mobility: Supine to Sit, Sit to Supine     Supine to sit: Total assist, +2 for physical assistance Sit to supine: Total assist, +2 for physical assistance   General bed mobility comments: Heavy +2 assist required for all aspects of bed mobility. Hand over hand assist to reah for R railing with LUE. Pt pulling on rail but still required gross +2 total assist to helicopter around to EOB.    Transfers                   General transfer comment: Unable to progress to OOB at this time.    Ambulation/Gait                  Stairs            Wheelchair Mobility     Tilt Bed    Modified Rankin (Stroke Patients Only)       Balance Overall balance assessment: Needs assistance Sitting-balance support: Feet supported, Bilateral upper extremity supported Sitting balance-Leahy Scale: Zero Sitting balance - Comments: Max assist at times. Postural control: Posterior lean                                   Pertinent Vitals/Pain Pain Assessment Pain Assessment: Faces Faces Pain Scale: Hurts even more Pain Location: LLE Pain Descriptors / Indicators: Operative site guarding, Sore, Grimacing, Moaning Pain Intervention(s): Limited activity within patient's tolerance, Monitored during session, Repositioned, RN gave pain meds during session    Home Living Family/patient expects to be discharged to:: Assisted living  Additional Comments: Per chart review [facility] the [facility]    Prior Function Prior Level of Function : Needs assist             Mobility Comments: Using rollator, able to ambulate short distances with Mod I ADLs Comments: Able to complete ADLs largely Mod I to Supervision     Extremity/Trunk Assessment   Upper Extremity Assessment Upper Extremity Assessment: RUE deficits/detail;LUE deficits/detail;Generalized weakness RUE Deficits / Details:  Right hemiparesis at baseline secondary to prior CVA; AAROM shoulder flexion to approximately 70 degrees; AAROM elbow extension to approximately 40 degrees; other AROM largely WFL; decreased coordination; generalized weakness RUE Coordination: decreased fine motor;decreased gross motor LUE Deficits / Details: generalized weakness; mildly decreased AROM shoulder flexion with all other ROM WFL; decreased coordination LUE Coordination: decreased fine motor;decreased gross motor    Lower Extremity Assessment Lower Extremity Assessment: Defer to PT evaluation RLE Deficits / Details: Generalized weakness, unable to demonstrate full knee extension against gravity. Minimal active movement noted of RLE during bed mobility. LLE Deficits / Details: Acute pain, decreased strength and AROM consistent with above mentioned surgery.  No active movement noted of LLE throughout session. Pt grabbing L hip with L hand due to pain    Cervical / Trunk Assessment Cervical / Trunk Assessment: Kyphotic Cervical / Trunk Exceptions: Forward head posture with rounded shoulders  Communication   Communication Communication: Impaired Factors Affecting Communication: Difficulty expressing self    Cognition Arousal: Alert Behavior During Therapy: Flat affect   PT - Cognitive impairments: Difficult to assess Difficult to assess due to: Impaired communication                     PT - Cognition Comments: non-verbal Following commands: Impaired Following commands impaired: Follows one step commands inconsistently     Cueing Cueing Techniques: Verbal cues, Gestural cues, Tactile cues, Visual cues     General Comments General comments (skin integrity, edema, etc.): VSS on 2L continuous O2 through nasal cannula throughout session. RN and NT present at beginning of session. Pt's son arriving mid-session and providing pt's PLOF. Son very supportive with pt and son very happy to see each other.    Exercises  General Exercises - Lower Extremity Long Arc Quad: 5 reps, Limitations Long Arc Quad Limitations: Unable to achieve full ROM but did 5 reps through partial range.   Assessment/Plan    PT Assessment Patient needs continued PT services  PT Problem List Decreased strength;Decreased range of motion;Decreased activity tolerance;Decreased balance;Decreased mobility;Decreased knowledge of use of DME;Decreased safety awareness;Decreased knowledge of precautions;Pain       PT Treatment Interventions DME instruction;Gait training;Functional mobility training;Therapeutic exercise;Therapeutic activities;Balance training;Patient/family education    PT Goals (Current goals can be found in the Care Plan section)  Acute Rehab PT Goals Patient Stated Goal: None stated PT Goal Formulation: Patient unable to participate in goal setting Time For Goal Achievement: 10/25/23 Potential to Achieve Goals: Fair    Frequency Min 2X/week     Co-evaluation PT/OT/SLP Co-Evaluation/Treatment: Yes Reason for Co-Treatment: Complexity of the patient's impairments (multi-system involvement);Necessary to address cognition/behavior during functional activity;For patient/therapist safety;To address functional/ADL transfers PT goals addressed during session: Balance;Proper use of DME;Strengthening/ROM;Mobility/safety with mobility OT goals addressed during session: ADL's and self-care;Strengthening/ROM       AM-PAC PT 6 Clicks Mobility  Outcome Measure Help needed turning from your back to your side while in a flat bed without using bedrails?: Total Help needed moving from lying on  your back to sitting on the side of a flat bed without using bedrails?: Total Help needed moving to and from a bed to a chair (including a wheelchair)?: Total Help needed standing up from a chair using your arms (e.g., wheelchair or bedside chair)?: Total Help needed to walk in hospital room?: Total Help needed climbing 3-5 steps with a  railing? : Total 6 Click Score: 6    End of Session Equipment Utilized During Treatment: Oxygen Activity Tolerance: Patient limited by pain;Patient limited by fatigue Patient left: in bed;with call bell/phone within reach;with bed alarm set;with family/visitor present Nurse Communication: Mobility status PT Visit Diagnosis: Pain;Difficulty in walking, not elsewhere classified (R26.2);History of falling (Z91.81) Pain - Right/Left: Left Pain - part of body: Hip;Knee    Time: 9043-8968 PT Time Calculation (min) (ACUTE ONLY): 35 min   Charges:   PT Evaluation $PT Eval Moderate Complexity: 1 Mod   PT General Charges $$ ACUTE PT VISIT: 1 Visit         Leita Sable, PT, DPT Acute Rehabilitation Services Secure Chat Preferred Office: [phone]   Leita JONETTA Sable 10/11/2023, 3:10 PM

## 2023-10-12 DIAGNOSIS — S72002A Fracture of unspecified part of neck of left femur, initial encounter for closed fracture: Secondary | ICD-10-CM | POA: Diagnosis not present

## 2023-10-12 DIAGNOSIS — W19XXXA Unspecified fall, initial encounter: Secondary | ICD-10-CM | POA: Diagnosis not present

## 2023-10-12 DIAGNOSIS — Z8679 Personal history of other diseases of the circulatory system: Secondary | ICD-10-CM | POA: Diagnosis not present

## 2023-10-12 DIAGNOSIS — E870 Hyperosmolality and hypernatremia: Secondary | ICD-10-CM | POA: Diagnosis not present

## 2023-10-12 LAB — BASIC METABOLIC PANEL WITH GFR
Anion gap: 12 (ref 5–15)
BUN: 90 mg/dL — ABNORMAL HIGH (ref 8–23)
CO2: 17 mmol/L — ABNORMAL LOW (ref 22–32)
Calcium: 7.6 mg/dL — ABNORMAL LOW (ref 8.9–10.3)
Chloride: 121 mmol/L — ABNORMAL HIGH (ref 98–111)
Creatinine, Ser: 4.2 mg/dL — ABNORMAL HIGH (ref 0.61–1.24)
GFR, Estimated: 13 mL/min — ABNORMAL LOW (ref >60)
Glucose, Bld: 98 mg/dL (ref 70–99)
Potassium: 4.3 mmol/L (ref 3.5–5.1)
Sodium: 150 mmol/L — ABNORMAL HIGH (ref 135–145)

## 2023-10-12 LAB — CBC
HCT: 27.7 % — ABNORMAL LOW (ref 39.0–52.0)
Hemoglobin: 8.6 g/dL — ABNORMAL LOW (ref 13.0–17.0)
MCH: 31.9 pg (ref 26.0–34.0)
MCHC: 31 g/dL (ref 30.0–36.0)
MCV: 102.6 fL — ABNORMAL HIGH (ref 80.0–100.0)
Platelets: 87 K/uL — ABNORMAL LOW (ref 150–400)
RBC: 2.7 MIL/uL — ABNORMAL LOW (ref 4.22–5.81)
RDW: 19.2 % — ABNORMAL HIGH (ref 11.5–15.5)
WBC: 5.7 K/uL (ref 4.0–10.5)
nRBC: 0 % (ref 0.0–0.2)

## 2023-10-12 MED ORDER — ENOXAPARIN SODIUM 30 MG/0.3ML IJ SOSY
30.0000 mg | PREFILLED_SYRINGE | INTRAMUSCULAR | Status: DC
  Administered 2023-10-12 – 2023-10-14 (×3): 30 mg via SUBCUTANEOUS
  Filled 2023-10-12 (×3): qty 0.3

## 2023-10-12 MED ORDER — ACETAMINOPHEN 325 MG PO TABS: 650.0000 mg | ORAL_TABLET | Freq: Four times a day (QID) | ORAL | Status: DC | PRN

## 2023-10-12 MED ORDER — TRAMADOL HCL 50 MG PO TABS: 50.0000 mg | ORAL_TABLET | Freq: Four times a day (QID) | ORAL | 0 refills | Status: DC | PRN

## 2023-10-12 MED ORDER — APIXABAN 2.5 MG PO TABS
2.5000 mg | ORAL_TABLET | Freq: Two times a day (BID) | ORAL | Status: DC
Start: 2023-10-12 — End: 2023-10-12

## 2023-10-12 NOTE — Progress Notes (Signed)
 [name] 3W[facility] Liaison Note   [name] is a current patient with [facility] with a terminal diagnosis of hypertensive heart disease with heart failure. Patient was admitted 10/08/2023 after a fall at his ALF. Per Dr. Todd Newcomer with [facility] this is a related hospital admission. Patient is a DNR.   Patient was being bathed during visit. Contacted daughter by phone to discuss plan of care, including no further evaluation of swallowing and proceeding with regular diet at this time. She is unaware of discharge date at this time reporting that she is anticipating more information from PT and OT as well as further discussion with the attending.    Patient remains inpatient appropriate for skilled monitoring, assessment as well as continued IV fluids.    V/S: 98/58/16    134/54   O2 96% on 1LPM via [place]   I/O: 1433/980   Abnormal Labs: BUN 90, Cre 4.2, Ca7.6, GFRe 13, HGB 8.6, Plt 87   Diagnostics: none new   IV/PRN medications:  NS at 75ml/hr, Ancef  2G every 12 hours (discontinued 8.14 pm)   Assessment and Plan per Darci Pore, MD 8.14.25:  Acute displaced subtrochanteric fracture of proximal left femur- Status post mechanical fall 8/13 s/p Cephalomedullary nailing of left intertrochanteric femur fracture  Continue pain control, supportive care. Monitor post op Hb.   Acute on CKD stage IV: Continue gentle IV hydration. Avoid nephrotoxic drugs. UA unremarkable for infection. Monitor daily renal function.   Acute on chronic anemia- Patient's baseline hemoglobin ranges 9-10. His hemoglobin 5.6, received total 2 units of blood, this am Hb 7.7. Discussed with family to stop Eliquis  upon discharge. Follow h/h and transfuse for Hb < 7.   Hypovolemic hypernatremia- In the setting of poor oral intake. NPO due to dysphagia. Continue gentle IV fluids 1/2NS at 75ml/hr. Trend sodium. Monitor strict input and  output.   Dysphagia- SLP advised NPO. Hold oral meds. MBS to be scheduled. Patient family refused PEG tube.   Persistent A-fib: EKG shows Afib rate controlled. Hold home Eliquis  due to drop in Hb.     Essential hypertension: Hold oral meds as he is more sleepy. IV hydralazine  as needed for elevated blood pressures.   Hyperlipidemia: Resume statin upon discharge.   Depression: On Lexapro  PTA.   Discharge Plan: return to independent living apartment with the support of hospice services.    Family contact: Spoke with daughter, [name], by phone   IDT: updated   Goals of care: clear, comfort focused care, DNR   Should patient need ambulance transport, please use [facility] as they contract this service for our active hospice patients.   Please call with any hospice questions or concerns.   Elouise Husband, BSN, RN, [facility] Liaison [phone]

## 2023-10-12 NOTE — Progress Notes (Addendum)
 Fracture Care Post-op Anticoagulation Consult Note  Brief Assessment: s/p CM nailing of left femur fracture on 8/13. Pharmacy consulted to resume apixaban  if POD 1 Hgb >=9.  Hgb is 8.9 and platelets are 77. No overt bleeding noted.  Update 8/15: Hgb 8.6 and stable, platelets up to 87. Per Ortho note, ok to resume apixaban  but patient remains NPO. Discussed with Lauraine Moores and agreed to start enoxaparin  for VTE prophylaxis.  Plan: Enoxaparin  30 mg SQ q24h Pharmacy will f/u ability to transition back to apixaban  as able  Thank you for involving pharmacy in this patient's care.  Delon Sax, PharmD, BCPS Clinical Pharmacist Clinical phone for 10/12/2023 is [phone] 10/12/2023 11:31 AM

## 2023-10-12 NOTE — Progress Notes (Signed)
 Progress Note   Patient: [name] FMW:[id number] DOB: 1928/06/25 DOA: 10/08/2023     4 DOS: the patient was seen and examined on 10/12/2023   Brief hospital course: [name] is a 89 y.o. male with medical history significant for ischemic stroke with residual expressive aphasia, nonverbal at baseline, residual right hemiparesis, persistent atrial fibrillation chronically anticoagulated on Eliquis , anemia of chronic disease baseline hemoglobin 9-10, CKD stage IV with baseline creatinine 3.2-3.4, essential hypertension, chronic right-sided systolic heart failure, who is admitted to [facility] on 10/08/2023 with acute left hip fracture after presenting from snf to [facility] ED for evaluation of fall.   Patient sustained acute left hip fracture, laboratory workup notable for acute kidney injury, hyponatremia, acute on chronic anemia admitted to TRH service for further management evaluation, orthopedic consulted.  S/p Cephalomedullary nailing of left intertrochanteric femur fracture 10/10/23.  Assessment and Plan: Acute displaced subtrochanteric fracture of proximal left femur- Status post mechanical fall 8/13 s/p Cephalomedullary nailing of left intertrochanteric femur fracture  Continue pain control, supportive care. Hb stable >8.   Acute on CKD stage IV: Continue gentle IV hydration. Avoid nephrotoxic drugs. UA unremarkable for infection. Monitor daily renal function.  Acute on chronic anemia- Patient's baseline hemoglobin ranges 9-10. His hemoglobin 5.6, received total 2 units of blood, this am Hb 8.6. Discussed with family to stop Eliquis  upon discharge. Follow h/h and transfuse for Hb < 7.  Hypovolemic hypernatremia- In the setting of poor oral intake. NPO due to dysphagia. Continue gentle IV fluids 1/2NS at 75ml/hr. Trend sodium. Monitor strict input and output.  Dysphagia- Family refused barium swallow study and PEG tube. Wished for pleasure feeds, aware of  aspiration risk.  Regular diet ordered.  Plan to send him back with hospice services.  Persistent A-fib: EKG shows Afib rate controlled. Hold home Eliquis  due to drop in Hb.    Essential hypertension: Hold oral meds as he is more sleepy. IV hydralazine  as needed for elevated blood pressures.  Hyperlipidemia: Resume statin upon discharge.  Depression: On Lexapro  PTA.  Hospice patient on [facility]- Patient from independent living facility under hospice care.  His overall medical condition deteriorating as per family since few weeks. Hospice team to follow him while inpatient. Plan to send him back tomorrow if stable.      Nursing supportive care. Fall, aspiration precautions. Diet:  Diet Orders (From admission, onward)     Start     Ordered   10/09/23 1025  Diet NPO time specified Except for: Sips with Meds, Ice Chips  Diet effective now       Question Answer Comment  Except for Sips with Meds   Except for Ice Chips      10/09/23 1025           DVT prophylaxis: enoxaparin  (LOVENOX ) injection 30 mg Start: 10/12/23 1230 SCDs Start: 10/10/23 1805 SCDs Start: 10/08/23 2307  Level of care: Telemetry Medical   Code Status: Limited: Do not attempt resuscitation (DNR) -DNR-LIMITED -Do Not Intubate/DNI   Subjective: Patient is seen and examined today morning. He is more alert, awake. RN at bedside, no overnight issues.  Physical Exam: Vitals:   10/11/23 1955 10/12/23 0400 10/12/23 0500 10/12/23 0830  BP: (!) 132/54 (!) 137/55  (!) 134/54  Pulse: (!) 52 (!) 56  (!) 58  Resp: 16 16  16   Temp: 98.2 F (36.8 C) 97.8 F (36.6 C)  98 F (36.7 C)  TempSrc: Oral Oral  Axillary  SpO2: 93%  93%  96%  Weight:   80.3 kg   Height:        General - Elderly ill looking Caucasian male, confused, no distress HEENT - PERRLA, EOMI, atraumatic head, non tender sinuses. Lung - Clear, basal rales, rhonchi, no wheezes. Heart - S1, S2 heard, no murmurs, rubs, 1+ pedal  edema. Abdomen - Soft, non tender, bowel sounds good Neuro - awake, confused, unable to do full neuro exam. Skin - Warm and dry. Chronic leg skin changes.  Data Reviewed:      Latest Ref Rng & Units 10/12/2023    7:07 AM 10/11/2023    7:01 AM 10/10/2023    2:19 PM  CBC  WBC 4.0 - 10.5 K/uL 5.7  6.0    Hemoglobin 13.0 - 17.0 g/dL 8.6  8.9  7.5   Hematocrit 39.0 - 52.0 % 27.7  27.0  22.0   Platelets 150 - 400 K/uL 87  77        Latest Ref Rng & Units 10/12/2023    7:07 AM 10/11/2023    7:01 AM 10/10/2023    2:19 PM  BMP  Glucose 70 - 99 mg/dL 98  99  888   BUN 8 - 23 mg/dL 90  88  79   Creatinine 0.61 - 1.24 mg/dL 5.79  5.80  5.69   Sodium 135 - 145 mmol/L 150  149  152   Potassium 3.5 - 5.1 mmol/L 4.3  4.8  4.3   Chloride 98 - 111 mmol/L 121  121  117   CO2 22 - 32 mmol/L 17  17    Calcium  8.9 - 10.3 mg/dL 7.6  7.2     DG FEMUR PORT MIN 2 VIEWS LEFT Result Date: 10/10/2023 CLINICAL DATA:  Fracture, postop. EXAM: LEFT FEMUR PORTABLE 2 VIEWS COMPARISON:  Preoperative imaging FINDINGS: Femoral intramedullary nail with trans trochanteric and distal locking screw fixation traverse proximal femur fracture. Improved fracture alignment from preoperative imaging. Persistent displacement of the lesser trochanteric component. Recent postsurgical change includes air and edema in the soft tissues. Vascular calcifications are seen. IMPRESSION: ORIF of proximal femur fracture, in improved alignment from preoperative imaging. Electronically Signed   By: Andrea Gasman M.D.   On: 10/10/2023 20:40   DG FEMUR MIN 2 VIEWS LEFT Result Date: 10/10/2023 CLINICAL DATA:  Elective surgery.  Fracture fixation. EXAM: LEFT FEMUR 2 VIEWS COMPARISON:  Radiograph 10/08/2023 FINDINGS: Six fluoroscopic spot views of the femur submitted from the operating room. Femoral intramedullary nail with trans trochanteric and distal locking screw fixation traverse comminuted proximal femur fracture. Fluoroscopy time 1 minutes  31 seconds. Dose 15.14 mGy. IMPRESSION: Intraoperative fluoroscopy during femur fracture fixation. Electronically Signed   By: Andrea Gasman M.D.   On: 10/10/2023 17:35   DG C-Arm 1-60 Min-No Report Result Date: 10/10/2023 Fluoroscopy was utilized by the requesting physician.  No radiographic interpretation.   DG C-Arm 1-60 Min-No Report Result Date: 10/10/2023 Fluoroscopy was utilized by the requesting physician.  No radiographic interpretation.    Family Communication: no family at bedside. Unable to reach daughter over phone.  Disposition: Status is: Inpatient Remains inpatient appropriate because: s/p orif, pain control, supportive care.  Planned Discharge Destination: Skilled nursing facility     Time spent: 40 minutes  Author: Concepcion Riser, MD 10/12/2023 3:33 PM Secure chat 7am to 7pm For on call review [email or web address].

## 2023-10-12 NOTE — Discharge Instructions (Signed)
 Orthopaedic Trauma Service Discharge Instructions   General Discharge Instructions  WEIGHT BEARING STATUS:weightbearing as tolerated  RANGE OF MOTION/ACTIVITY: unrestricted range of motion  Wound Care: Incisions can be left open to air if there is no drainage. Once the incision is completely dry and without drainage, it may be left open to air out.  Showering may begin post op day 3 (Saturday 10/13/23).  Clean incision gently with soap and water .  DVT/PE prophylaxis: Resume home dose Eliquis   Diet: as you were eating previously.  Can use over the counter stool softeners and bowel preparations, such as Miralax, to help with bowel movements.  Narcotics can be constipating.  Be sure to drink plenty of fluids  PAIN MEDICATION USE AND EXPECTATIONS  You have likely been given narcotic medications to help control your pain.  After a traumatic event that results in an fracture (broken bone) with or without surgery, it is ok to use narcotic pain medications to help control one's pain.  We understand that everyone responds to pain differently and each individual patient will be evaluated on a regular basis for the continued need for narcotic medications. Ideally, narcotic medication use should last no more than 6-8 weeks (coinciding with fracture healing).   As a patient it is your responsibility as well to monitor narcotic medication use and report the amount and frequency you use these medications when you come to your office visit.   We would also advise that if you are using narcotic medications, you should take a dose prior to therapy to maximize you participation.  IF YOU ARE ON NARCOTIC MEDICATIONS IT IS NOT PERMISSIBLE TO OPERATE A MOTOR VEHICLE (MOTORCYCLE/CAR/TRUCK/MOPED) OR HEAVY MACHINERY DO NOT MIX NARCOTICS WITH OTHER CNS (CENTRAL NERVOUS SYSTEM) DEPRESSANTS SUCH AS ALCOHOL   POST-OPERATIVE OPIOID TAPER INSTRUCTIONS: It is important to wean off of your opioid medication as soon as  possible. If you do not need pain medication after your surgery it is ok to stop day one. Opioids include: Codeine, Hydrocodone(Norco, Vicodin), Oxycodone (Percocet, oxycontin ) and hydromorphone amongst others.  Long term and even short term use of opiods can cause: Increased pain response Dependence Constipation Depression Respiratory depression And more.  Withdrawal symptoms can include Flu like symptoms Nausea, vomiting And more Techniques to manage these symptoms Hydrate well Eat regular healthy meals Stay active Use relaxation techniques(deep breathing, meditating, yoga) Do Not substitute Alcohol  to help with tapering If you have been on opioids for less than two weeks and do not have pain than it is ok to stop all together.  Plan to wean off of opioids This plan should start within one week post op of your fracture surgery  Maintain the same interval or time between taking each dose and first decrease the dose.  Cut the total daily intake of opioids by one tablet each day Next start to increase the time between doses. The last dose that should be eliminated is the evening dose.    STOP SMOKING OR USING NICOTINE PRODUCTS!!!!  As discussed nicotine severely impairs your body's ability to heal surgical and traumatic wounds but also impairs bone healing.  Wounds and bone heal by forming microscopic blood vessels (angiogenesis) and nicotine is a vasoconstrictor (essentially, shrinks blood vessels).  Therefore, if vasoconstriction occurs to these microscopic blood vessels they essentially disappear and are unable to deliver necessary nutrients to the healing tissue.  This is one modifiable factor that you can do to dramatically increase your chances of healing your injury.  (This means no  smoking, no nicotine gum, patches, etc)  DO NOT USE NONSTEROIDAL ANTI-INFLAMMATORY DRUGS (NSAID'S)  Using products such as Advil (ibuprofen), Aleve (naproxen), Motrin (ibuprofen) for additional pain  control during fracture healing can delay and/or prevent the healing response.  If you would like to take over the counter (OTC) medication, Tylenol  (acetaminophen ) is ok.  However, some narcotic medications that are given for pain control contain acetaminophen  as well. Therefore, you should not exceed more than 4000 mg of tylenol  in a day if you do not have liver disease.  Also note that there are may OTC medicines, such as cold medicines and allergy medicines that my contain tylenol  as well.  If you have any questions about medications and/or interactions please ask your doctor/PA or your pharmacist.      ICE AND ELEVATE INJURED/OPERATIVE EXTREMITY  Using ice and elevating the injured extremity above your heart can help with swelling and pain control.  Icing in a pulsatile fashion, such as 20 minutes on and 20 minutes off, can be followed.    Do not place ice directly on skin. Make sure there is a barrier between to skin and the ice pack.    Using frozen items such as frozen peas works well as the conform nicely to the are that needs to be iced.  USE AN ACE WRAP OR TED HOSE FOR SWELLING CONTROL  In addition to icing and elevation, Ace wraps or TED hose are used to help limit and resolve swelling.  It is recommended to use Ace wraps or TED hose until you are informed to stop.    When using Ace Wraps start the wrapping distally (farthest away from the body) and wrap proximally (closer to the body)   Example: If you had surgery on your leg or thing and you do not have a splint on, start the ace wrap at the toes and work your way up to the thigh        If you had surgery on your upper extremity and do not have a splint on, start the ace wrap at your fingers and work your way up to the upper arm  CALL THE OFFICE FOR MEDICATION REFILLS OR WITH ANY QUESTIONS/CONCERNS: [phone]   VISIT OUR WEBSITE FOR ADDITIONAL INFORMATION: [email or web address]    Discharge Wound Care Instructions  Do NOT apply any  ointments, solutions or lotions to pin sites or surgical wounds.  These prevent needed drainage and even though solutions like hydrogen peroxide kill bacteria, they also damage cells lining the pin sites that help fight infection.  Applying lotions or ointments can keep the wounds moist and can cause them to breakdown and open up as well. This can increase the risk for infection. When in doubt call the office.  If any drainage is noted, use foam dressings - These dressing supplies should be available at local medical supply stores [facility], [facility], etc) as well as Insurance claims handler ([employer], [employer], [employer], etc)  Once the incision is completely dry and without drainage, it may be left open to air out.  Showering may begin 36-48 hours later.  Cleaning gently with soap and water .   Call office for the following: Temperature greater than 101F Persistent nausea and vomiting Severe uncontrolled pain Redness, tenderness, or signs of infection (pain, swelling, redness, odor or green/yellow discharge around the site) Difficulty breathing, headache or visual disturbances Hives Persistent dizziness or light-headedness Extreme fatigue Any other questions or concerns you may have after discharge  In an emergency,  call 911 or go to an Emergency Department at a nearby hospital  OTHER HELPFUL INFORMATION You should wean off your narcotic medicines as soon as you are able.  Most patients will be off or using minimal narcotics before their first postop appointment.   Do not drink alcoholic beverages or take illicit drugs when taking pain medications.  In most states it is against the law to drive while you are in a splint or sling.  And certainly against the law to drive while taking narcotics.  Pain medication may make you constipated.  Below are a few solutions to try in this order: Decrease the amount of pain medication if you aren't having pain. Drink lots of decaffeinated  fluids. Drink prune juice and/or each dried prunes  If the first 3 don't work start with additional solutions Take Colace - an over-the-counter stool softener Take Senokot - an over-the-counter laxative Take Miralax - a stronger over-the-counter laxative

## 2023-10-12 NOTE — Progress Notes (Signed)
 Orthopaedic Trauma Progress Note  SUBJECTIVE: Doing okay this afternoon, about to get a bed bath.  Sitting up in bed.  Does not appear to be having much pain in the hip.  Received a dose of tramadol  last night.  Has not required any pain medications today.  Was evaluated by SLP yesterday and made NPO until family decided they wanted to move forward with a barium swallow testing.  They have decided not to pursue this.  Patient now placed back on a regular diet.  His daughter Imogene) is at bedside, notes he has been able to tolerate crackers and applesauce without significant swallowing difficulty.  No BM since admission.  OBJECTIVE:  Vitals:   10/12/23 0400 10/12/23 0830  BP: (!) 137/55 (!) 134/54  Pulse: (!) 56 (!) 58  Resp: 16 16  Temp: 97.8 F (36.6 C) 98 F (36.7 C)  SpO2: 93% 96%    Opiates Today (MME): Today's  total administered Morphine  Milligram Equivalents: 0 Opiates Yesterday (MME): Yesterday's total administered Morphine  Milligram Equivalents: 5  General: Sitting up in bed comfortably, no acute distress respiratory: No increased work of breathing.  Operative Extremity (LLE): Dressings removed, incisions are CDI.  No obvious signs of discomfort or pain with palpation over the hip or throughout the thigh.  Compartments are soft compressible.  No obvious calf tenderness.  Ankle dorsiflexion/plantarflexion intact.  Able to wiggle the toes.  Endorses sensation light touch over all aspects of the foot.  Foot warm and well-perfused. + DP pulse  IMAGING: Stable post op imaging left femur.   LABS:  Results for orders placed or performed during the hospital encounter of 10/08/23 (from the past 24 hours)  CBC     Status: Abnormal   Collection Time: 10/12/23  7:07 AM  Result Value Ref Range   WBC 5.7 4.0 - 10.5 K/uL   RBC 2.70 (L) 4.22 - 5.81 MIL/uL   Hemoglobin 8.6 (L) 13.0 - 17.0 g/dL   HCT 72.2 (L) 60.9 - 47.9 %   MCV 102.6 (H) 80.0 - 100.0 fL   MCH 31.9 26.0 - 34.0 pg   MCHC  31.0 30.0 - 36.0 g/dL   RDW 80.7 (H) 88.4 - 84.4 %   Platelets 87 (L) 150 - 400 K/uL   nRBC 0.0 0.0 - 0.2 %  Basic metabolic panel with GFR     Status: Abnormal   Collection Time: 10/12/23  7:07 AM  Result Value Ref Range   Sodium 150 (H) 135 - 145 mmol/L   Potassium 4.3 3.5 - 5.1 mmol/L   Chloride 121 (H) 98 - 111 mmol/L   CO2 17 (L) 22 - 32 mmol/L   Glucose, Bld 98 70 - 99 mg/dL   BUN 90 (H) 8 - 23 mg/dL   Creatinine, Ser 5.79 (H) 0.61 - 1.24 mg/dL   Calcium  7.6 (L) 8.9 - 10.3 mg/dL   GFR, Estimated 13 (L) >60 mL/min   Anion gap 12 5 - 15    ASSESSMENT: [name] is a 89 y.o. male, 2 Days Post-Op s/p fall at home Procedures: INTRAMEDULLARY NAILING LEFT INTERTROCHANTERIC/SUBTROCHANTERIC FEMUR FRACTURE  CV/Blood loss: Acute blood loss anemia, Hgb 8.6 this AM. Fairly stable over last 24 hours. Received 2 units PRBCs on 10/10/2023.  Hemodynamically stable  PLAN: Weightbearing: WBAT LLE ROM: Okay for hip and knee motion as tolerated Incisional and dressing care: Ok to leave incisions open to air  Showering: Okay to begin getting incisions wet 10/13/2023 Orthopedic device(s): None  Pain  management:  1. Tylenol  650 mg q 6 hours PRN 2. Tramadol  50 mg q 6 hours PRN 3. Morphine  0.5 mg q 2 hours PRN VTE prophylaxis: Patient started on Lovenox  today given NPO status.  Okay to transition back to home dose Eliquis  from ortho standpoint.  Continue SCDs ID:  Ancef  2gm post op completed Foley/Lines:  No foley, KVO IVFs Impediments to Fracture Healing: Vitamin D  level 40, no supplementation needed Dispo: PT/OT evaluation ongoing.  Patient okay for discharge from ortho standpoint once cleared by medicine team and therapies  D/C recommendations: - Tylenol  as primary pain control.  Possible need for tramadol   - Home dose Eliquis  for DVT prophylaxis - No additional need for Vit D supplementation  Follow - up plan: 2 weeks after d/c for wound check and repeat x-rays   Contact  information:  Franky Light MD, Lauraine Moores PA-C. After hours and holidays please check [email or web address] for group call information for [facility]   Lauraine PATRIC Moores, PA-C [phone] (office) [email or web address]

## 2023-10-12 NOTE — Progress Notes (Signed)
 Speech Language Pathology Treatment: Dysphagia  Patient Details Name: [name] MRN: [id number] DOB: Jul 06, 1928 Today's Date: 10/12/2023 Time: 8884-8784 SLP Time Calculation (min) (ACUTE ONLY): 60 min  Assessment / Plan / Recommendation Clinical Impression  Pt seen at bedside for skilled ST intervention targeting goal for education regarding aspiration risk. Pt's daughter was at bedside, and indicated she and her brothers have decided they do NOT want to proceed with instrumental assessment (MBS), and do NOT desire PEG tube consideration at this time. Family desires to continue PO intake with known high risk of aspiration.  Oral care was completed with suction, following which pt accepted trials of ice chips, thin liquid, puree, and solid textures. Audible swallow with delayed throat clear noted after presentation of thin liquids, raising concern for airway compromise. However, daughter reported pt exhibited frequent throat clearing with PO intake prior to admit. Ice chips, puree, and solid textures appeared to be tolerated without obvious oral issues or overt s/s aspiration. Use of thickened liquids is not recommended at this time, as this may result in decreased fluid intake and potential for dehydration and infection.   Pt's daughter was receptive to education regarding pt's high risk of aspiration with any PO intake, especially with variable alertness/mentation. She was encouraged to choose softer solids for energy conservation, and provide small bites and sips at a slow rate. Pt should be fed only when fully awake and alert. Oral care is critical to minimize bacteria and decrease infection risk. Will begin a regular diet with thin liquids to allow full range of PO choices, however, decisions regarding what/when to eat will need to be made on an ongoing basis throughout the day. Meds whole in puree - crush if needed. Safe swallow precautions were reviewed with pt's daughter and posted at [facility].    Medical team informed of results and recommendations re: family decision to continue PO intake with known high aspiration risk. SLP will follow up once more to continue education regarding mitigating aspiration and infection risk.   HPI HPI: 89yo male admitted from [facility] 10/08/23 after a fall with left hip fx. Surgery 10/10/23. PMH: ischemic stroke with residual expressive aphasia, nonverbal at baseline, right hemi. AFib, chronic anemia, CKD4, HTN, right sHF. On hospice.      SLP Plan  Continue with current plan of care      Recommendations  Diet recommendations: Regular;Thin liquid (to allow full range of PO choices. Family verbalizes awareness of high risk of aspiration) Medication Administration: Whole meds with puree Supervision: Full supervision/cueing for compensatory strategies;Trained caregiver to feed patient Compensations: Minimize environmental distractions;Slow rate;Small sips/bites (feed only when fully awake and alert. Appropriateness for PO intake may be variable throughout the day.) Postural Changes and/or Swallow Maneuvers: Seated upright 90 degrees;Upright 30-60 min after meal        Oral care QID   Frequent or constant Supervision/Assistance Dysphagia, unspecified (R13.10)     Continue with current plan of care    Dominiqua Cooner B. Dory, MSP, CCC-SLP Speech Language Pathologist Office: [phone]  Dory Caprice Daring 10/12/2023, 12:22 PM

## 2023-10-12 NOTE — Plan of Care (Signed)
   Problem: Education: Goal: Knowledge of General Education information will improve Description: Including pain rating scale, medication(s)/side effects and non-pharmacologic comfort measures Outcome: Progressing   Problem: Pain Managment: Goal: General experience of comfort will improve and/or be controlled Outcome: Progressing   Problem: Safety: Goal: Ability to remain free from injury will improve Outcome: Progressing

## 2023-10-13 DIAGNOSIS — W19XXXA Unspecified fall, initial encounter: Secondary | ICD-10-CM | POA: Diagnosis not present

## 2023-10-13 DIAGNOSIS — E870 Hyperosmolality and hypernatremia: Secondary | ICD-10-CM | POA: Diagnosis not present

## 2023-10-13 DIAGNOSIS — S72002A Fracture of unspecified part of neck of left femur, initial encounter for closed fracture: Secondary | ICD-10-CM | POA: Diagnosis not present

## 2023-10-13 DIAGNOSIS — Z8679 Personal history of other diseases of the circulatory system: Secondary | ICD-10-CM | POA: Diagnosis not present

## 2023-10-13 LAB — CBC
HCT: 26.7 % — ABNORMAL LOW (ref 39.0–52.0)
Hemoglobin: 8.5 g/dL — ABNORMAL LOW (ref 13.0–17.0)
MCH: 32 pg (ref 26.0–34.0)
MCHC: 31.8 g/dL (ref 30.0–36.0)
MCV: 100.4 fL — ABNORMAL HIGH (ref 80.0–100.0)
Platelets: 110 K/uL — ABNORMAL LOW (ref 150–400)
RBC: 2.66 MIL/uL — ABNORMAL LOW (ref 4.22–5.81)
RDW: 18.3 % — ABNORMAL HIGH (ref 11.5–15.5)
WBC: 4.7 K/uL (ref 4.0–10.5)
nRBC: 0 % (ref 0.0–0.2)

## 2023-10-13 LAB — BASIC METABOLIC PANEL WITH GFR
Anion gap: 10 (ref 5–15)
BUN: 88 mg/dL — ABNORMAL HIGH (ref 8–23)
CO2: 18 mmol/L — ABNORMAL LOW (ref 22–32)
Calcium: 7.9 mg/dL — ABNORMAL LOW (ref 8.9–10.3)
Chloride: 119 mmol/L — ABNORMAL HIGH (ref 98–111)
Creatinine, Ser: 3.84 mg/dL — ABNORMAL HIGH (ref 0.61–1.24)
GFR, Estimated: 14 mL/min — ABNORMAL LOW (ref >60)
Glucose, Bld: 101 mg/dL — ABNORMAL HIGH (ref 70–99)
Potassium: 3.9 mmol/L (ref 3.5–5.1)
Sodium: 147 mmol/L — ABNORMAL HIGH (ref 135–145)

## 2023-10-13 NOTE — Plan of Care (Signed)
   Problem: Health Behavior/Discharge Planning: Goal: Ability to manage health-related needs will improve Outcome: Progressing   Problem: Clinical Measurements: Goal: Ability to maintain clinical measurements within normal limits will improve Outcome: Progressing Goal: Diagnostic test results will improve Outcome: Progressing Goal: Respiratory complications will improve Outcome: Progressing

## 2023-10-13 NOTE — Progress Notes (Signed)
 [name] [address] [facility] Liaison Note   [name] is a current patient with [facility] with a terminal diagnosis of hypertensive heart disease with heart failure. Patient was admitted 10/08/2023 after a fall at his ALF. Per Dr. Todd Newcomer with [facility] this is a related hospital admission. Patient is a DNR.   Visited patient at bedside. He was awake but drowsy. Daughter at bedside. Verbalized that she is having a hard time regarding making decisions and wanting to ensure that she is making the best decisions that are focusing on quality of life for the the time patient has left. Patient noted to have some coughing today. Discussed possible aspiration and to continue to follow aspiration precautions provided by ST. Daughter also stated that patient will need hospital bed. Ensured daughter that I would get this ordered for possible delivery on tomorrow. Daughter states that she has around the clock care for patient at home, but wonders if he is too weak and will need a more skilled level of care following discharge. PT/OT has patient on schedule to be evaluated on Monday. Daughter feels like she would have a better idea of what next steps are at that time.    Patient remains inpatient appropriate for skilled monitoring, assessment as well as continued IV fluids.    V/S: 98.3/66/16    142/53   O2 98% on 1LPM via [place]   I/O: 905.03/1198   Abnormal Labs:  10/13/23 07:55 Basic metabolic panel with GFR: Rpt ! Sodium: 147 (H) Chloride: 119 (H) CO2: 18 (L) Glucose: 101 (H) BUN: 88 (H) Creatinine: 3.84 (H) Calcium : 7.9 (L) GFR, Estimated: 14 (L) RBC: 2.66 (L) Hemoglobin: 8.5 (L) HCT: 26.7 (L) MCV: 100.4 (H) RDW: 18.3 (H) Platelets: 110 (L)  Diagnostics: none new   IV/PRN medications:  NS at 75ml/hr stopped @ 2159 on 8.15, Tramadol  50mg  PO x1   Assessment and Plan per Darci Pore, MD 8.16.25  Assessment and Plan: Acute  displaced subtrochanteric fracture of proximal left femur- Status post mechanical fall 8/13 s/p Cephalomedullary nailing of left intertrochanteric femur fracture  Continue pain control, supportive care. Hb stable >8.    Acute on CKD stage IV: Got gentle IV hydration. Started on diet, but poor nutrition. Avoid nephrotoxic drugs. UA unremarkable for infection. Monitor daily renal function.   Acute on chronic anemia- Patient's baseline hemoglobin ranges 9-10. His hemoglobin 5.6, received total 2 units of blood, this am Hb 8.6. Discussed with family to stop Eliquis  upon discharge. Follow h/h and transfuse for Hb < 7.   Hypovolemic hypernatremia- In the setting of poor oral intake. Na 147 after IV fluids 1/2NS at 75ml/hr for 2 days. Trend sodium. Monitor strict input and output.   Dysphagia- Family refused barium swallow study and PEG tube. Wished for pleasure feeds, aware of aspiration risk. Regular diet ordered.  Plan to send him back to ILF with hospice services. DME need to be arranged.   Persistent A-fib: EKG shows Afib rate controlled. Hold home Eliquis  due to drop in Hb. Discussed with family to hold Eliquis  at discharge   Essential hypertension: Hold oral meds as he is more sleepy. IV hydralazine  as needed for elevated blood pressures.   Hyperlipidemia: Resume statin upon discharge.   Depression: On Lexapro  PTA.   Hospice patient on [facility]- Patient from independent living facility under hospice care.  Hospice team to follow him while inpatient. Plan to send him back tomorrow once DME is arranged.    Discharge  Plan: return to independent living apartment with the support of hospice services possibly on tomorrow   Family contact: Spoke with daughter, [name], at bedside. Hospital bed ordered and plans to be delivered on tomorrow.    IDT: updated   Goals of care: clear, comfort focused care, DNR   Should patient need ambulance transport, please use [facility] as  they contract this service for our active hospice patients.   Please call with any hospice questions or concerns.   Nat Babe, BSN, [facility] Liaison [phone]

## 2023-10-13 NOTE — Progress Notes (Signed)
 Progress Note   Patient: [name] FMW:[id number] DOB: Mar 20, 1928 DOA: 10/08/2023     5 DOS: the patient was seen and examined on 10/13/2023   Brief hospital course: BYARD CARRANZA is a 89 y.o. male with medical history significant for ischemic stroke with residual expressive aphasia, nonverbal at baseline, residual right hemiparesis, persistent atrial fibrillation chronically anticoagulated on Eliquis , anemia of chronic disease baseline hemoglobin 9-10, CKD stage IV with baseline creatinine 3.2-3.4, essential hypertension, chronic right-sided systolic heart failure, who is admitted to [facility] on 10/08/2023 with acute left hip fracture after presenting from snf to [facility] ED for evaluation of fall.   Patient sustained acute left hip fracture, laboratory workup notable for acute kidney injury, hyponatremia, acute on chronic anemia admitted to TRH service for further management evaluation, orthopedic consulted.  S/p Cephalomedullary nailing of left intertrochanteric femur fracture 10/10/23.  Assessment and Plan: Acute displaced subtrochanteric fracture of proximal left femur- Status post mechanical fall 8/13 s/p Cephalomedullary nailing of left intertrochanteric femur fracture  Continue pain control, supportive care. Hb stable >8.   Acute on CKD stage IV: Got gentle IV hydration. Started on diet, but poor nutrition. Avoid nephrotoxic drugs. UA unremarkable for infection. Monitor daily renal function.  Acute on chronic anemia- Patient's baseline hemoglobin ranges 9-10. His hemoglobin 5.6, received total 2 units of blood, this am Hb 8.6. Discussed with family to stop Eliquis  upon discharge. Follow h/h and transfuse for Hb < 7.  Hypovolemic hypernatremia- In the setting of poor oral intake. Na 147 after IV fluids 1/2NS at 75ml/hr for 2 days. Trend sodium. Monitor strict input and output.  Dysphagia- Family refused barium swallow study and PEG tube. Wished for pleasure feeds,  aware of aspiration risk. Regular diet ordered.  Plan to send him back to ILF with hospice services. DME need to be arranged.  Persistent A-fib: EKG shows Afib rate controlled. Hold home Eliquis  due to drop in Hb. Discussed with family to hold Eliquis  at discharge  Essential hypertension: Hold oral meds as he is more sleepy. IV hydralazine  as needed for elevated blood pressures.  Hyperlipidemia: Resume statin upon discharge.  Depression: On Lexapro  PTA.  Hospice patient on [facility]- Patient from independent living facility under hospice care.  Hospice team to follow him while inpatient. Plan to send him back tomorrow once DME is arranged.      Nursing supportive care. Fall, aspiration precautions. Diet:  Diet Orders (From admission, onward)     Start     Ordered   10/09/23 1025  Diet NPO time specified Except for: Sips with Meds, Ice Chips  Diet effective now       Question Answer Comment  Except for Sips with Meds   Except for Ice Chips      10/09/23 1025           DVT prophylaxis: enoxaparin  (LOVENOX ) injection 30 mg Start: 10/12/23 1230 SCDs Start: 10/10/23 1805 SCDs Start: 10/08/23 2307  Level of care: Telemetry Medical   Code Status: Limited: Do not attempt resuscitation (DNR) -DNR-LIMITED -Do Not Intubate/DNI   Subjective: Patient is seen and examined today morning. He is alert, shows rest room, abdomen distended. Daughter at bedside,  Physical Exam: Vitals:   10/12/23 2043 10/13/23 0439 10/13/23 0440 10/13/23 0846  BP: 95/64 133/63  (!) 142/53  Pulse: 60 67  66  Resp: 16 16  16   Temp: 98.5 F (36.9 C) 98.6 F (37 C)  98.3 F (36.8 C)  TempSrc: Axillary Axillary  Axillary  SpO2: 99% 99%  98%  Weight:   80.4 kg   Height:        General - Elderly ill looking Caucasian male, confused, no distress HEENT - PERRLA, EOMI, atraumatic head, non tender sinuses. Lung - Clear, basal rales, rhonchi, no wheezes. Heart - S1, S2 heard, no murmurs, rubs,  1+ pedal edema. Abdomen - Soft, non tender, bowel sounds good Neuro - awake, confused, unable to do full neuro exam. Skin - Warm and dry. Chronic leg skin changes.  Data Reviewed:      Latest Ref Rng & Units 10/13/2023    7:55 AM 10/12/2023    7:07 AM 10/11/2023    7:01 AM  CBC  WBC 4.0 - 10.5 K/uL 4.7  5.7  6.0   Hemoglobin 13.0 - 17.0 g/dL 8.5  8.6  8.9   Hematocrit 39.0 - 52.0 % 26.7  27.7  27.0   Platelets 150 - 400 K/uL 110  87  77       Latest Ref Rng & Units 10/13/2023    7:55 AM 10/12/2023    7:07 AM 10/11/2023    7:01 AM  BMP  Glucose 70 - 99 mg/dL 898  98  99   BUN 8 - 23 mg/dL 88  90  88   Creatinine 0.61 - 1.24 mg/dL 6.15  5.79  5.80   Sodium 135 - 145 mmol/L 147  150  149   Potassium 3.5 - 5.1 mmol/L 3.9  4.3  4.8   Chloride 98 - 111 mmol/L 119  121  121   CO2 22 - 32 mmol/L 18  17  17    Calcium  8.9 - 10.3 mg/dL 7.9  7.6  7.2    No results found.   Family Communication: Daughter and daughter in law at bedside, discussed care plan. DC plan once dme is arranged, likely Monday.  Disposition: Status is: Inpatient Remains inpatient appropriate because: s/p orif, pain control, supportive care.  Planned Discharge Destination: Skilled nursing facility     Time spent: 42 minutes  Author: Concepcion Riser, MD 10/13/2023 10:06 AM Secure chat 7am to 7pm For on call review [email or web address].

## 2023-10-13 NOTE — Plan of Care (Signed)
  Problem: Education: Goal: Knowledge of General Education information will improve Description: Including pain rating scale, medication(s)/side effects and non-pharmacologic comfort measures Outcome: Progressing   Problem: Pain Managment: Goal: General experience of comfort will improve and/or be controlled Outcome: Progressing

## 2023-10-14 DIAGNOSIS — E870 Hyperosmolality and hypernatremia: Secondary | ICD-10-CM | POA: Diagnosis not present

## 2023-10-14 DIAGNOSIS — W19XXXA Unspecified fall, initial encounter: Secondary | ICD-10-CM | POA: Diagnosis not present

## 2023-10-14 DIAGNOSIS — S72002A Fracture of unspecified part of neck of left femur, initial encounter for closed fracture: Secondary | ICD-10-CM | POA: Diagnosis not present

## 2023-10-14 DIAGNOSIS — Z515 Encounter for palliative care: Secondary | ICD-10-CM

## 2023-10-14 DIAGNOSIS — Z8679 Personal history of other diseases of the circulatory system: Secondary | ICD-10-CM | POA: Diagnosis not present

## 2023-10-14 LAB — CBC
HCT: 23.7 % — ABNORMAL LOW (ref 39.0–52.0)
Hemoglobin: 7.5 g/dL — ABNORMAL LOW (ref 13.0–17.0)
MCH: 31.8 pg (ref 26.0–34.0)
MCHC: 31.6 g/dL (ref 30.0–36.0)
MCV: 100.4 fL — ABNORMAL HIGH (ref 80.0–100.0)
Platelets: 113 K/uL — ABNORMAL LOW (ref 150–400)
RBC: 2.36 MIL/uL — ABNORMAL LOW (ref 4.22–5.81)
RDW: 17.8 % — ABNORMAL HIGH (ref 11.5–15.5)
WBC: 3.8 K/uL — ABNORMAL LOW (ref 4.0–10.5)
nRBC: 0 % (ref 0.0–0.2)

## 2023-10-14 LAB — BASIC METABOLIC PANEL WITH GFR
Anion gap: 10 (ref 5–15)
BUN: 87 mg/dL — ABNORMAL HIGH (ref 8–23)
CO2: 19 mmol/L — ABNORMAL LOW (ref 22–32)
Calcium: 8.2 mg/dL — ABNORMAL LOW (ref 8.9–10.3)
Chloride: 120 mmol/L — ABNORMAL HIGH (ref 98–111)
Creatinine, Ser: 3.66 mg/dL — ABNORMAL HIGH (ref 0.61–1.24)
GFR, Estimated: 15 mL/min — ABNORMAL LOW (ref >60)
Glucose, Bld: 103 mg/dL — ABNORMAL HIGH (ref 70–99)
Potassium: 4.2 mmol/L (ref 3.5–5.1)
Sodium: 149 mmol/L — ABNORMAL HIGH (ref 135–145)

## 2023-10-14 MED ORDER — HALOPERIDOL LACTATE 5 MG/ML IJ SOLN: 0.5000 mg | INTRAMUSCULAR | Status: DC | PRN

## 2023-10-14 MED ORDER — BIOTENE DRY MOUTH MT LIQD: 15.0000 mL | OROMUCOSAL | Status: DC | PRN

## 2023-10-14 MED ORDER — POLYVINYL ALCOHOL 1.4 % OP SOLN: 1.0000 [drp] | Freq: Four times a day (QID) | OPHTHALMIC | Status: DC | PRN

## 2023-10-14 MED ORDER — HALOPERIDOL LACTATE 2 MG/ML PO CONC
0.5000 mg | ORAL | Status: DC | PRN
Start: 2023-10-14 — End: 2023-10-16

## 2023-10-14 MED ORDER — HALOPERIDOL 0.5 MG PO TABS: 0.5000 mg | ORAL_TABLET | ORAL | Status: DC | PRN

## 2023-10-14 MED ORDER — GLYCOPYRROLATE 0.2 MG/ML IJ SOLN: 0.2000 mg | INTRAMUSCULAR | Status: DC | PRN

## 2023-10-14 MED ORDER — GLYCOPYRROLATE 1 MG PO TABS: 1.0000 mg | ORAL_TABLET | ORAL | Status: DC | PRN

## 2023-10-14 NOTE — Plan of Care (Signed)
  Problem: Pain Managment: Goal: General experience of comfort will improve and/or be controlled Outcome: Progressing   Problem: Safety: Goal: Ability to remain free from injury will improve Outcome: Progressing

## 2023-10-14 NOTE — Progress Notes (Signed)
 Progress Note   Patient: [name] FMW:[id number] DOB: 1928/03/22 DOA: 10/08/2023     6 DOS: the patient was seen and examined on 10/14/2023   Brief hospital course: [name] is a 89 y.o. male with medical history significant for ischemic stroke with residual expressive aphasia, nonverbal at baseline, residual right hemiparesis, persistent atrial fibrillation chronically anticoagulated on Eliquis , anemia of chronic disease baseline hemoglobin 9-10, CKD stage IV with baseline creatinine 3.2-3.4, essential hypertension, chronic right-sided systolic heart failure, who is admitted to [facility] on 10/08/2023 with acute left hip fracture after presenting from snf to [facility] ED for evaluation of fall.   Patient sustained acute left hip fracture, laboratory workup notable for acute kidney injury, hyponatremia, acute on chronic anemia admitted to TRH service for further management evaluation, orthopedic consulted.  S/p Cephalomedullary nailing of left intertrochanteric femur fracture 10/10/23. Family wish him to discharge back to ILF with hospice.  Assessment and Plan: Acute displaced subtrochanteric fracture of proximal left femur- Status post mechanical fall 8/13 s/p Cephalomedullary nailing of left intertrochanteric femur fracture  Continue pain control, supportive care. Hb stable >8.   Acute on CKD stage IV: Got gentle IV hydration. Started on diet, but poor nutrition. Avoid nephrotoxic drugs. UA unremarkable for infection. Monitor daily renal function.  Acute on chronic anemia- Patient's baseline hemoglobin ranges 9-10. His hemoglobin 5.6, received total 2 units of blood, this am Hb 7.5 Discussed with family to stop Eliquis  upon discharge. Follow h/h and transfuse for Hb < 7.  Hypovolemic hypernatremia- In the setting of poor oral intake. Na 149 stable. Encourage oral fluids, diet. Trend sodium. Monitor strict input and output.  Dysphagia- Family refused barium swallow  study and PEG tube. Wished for pleasure feeds, aware of aspiration risk. Regular diet ordered.  Plan to send him back to ILF with hospice services. DME need to be arranged.  Persistent A-fib: EKG shows Afib rate controlled. Hold home Eliquis  due to drop in Hb. Discussed with family to hold Eliquis  at discharge  Essential hypertension: Hold oral meds as he is more sleepy. IV hydralazine  as needed for elevated blood pressures.  Hyperlipidemia: Resume statin upon discharge.  Depression: On Lexapro  PTA.  Hospice patient on [facility]- Patient from independent living facility under hospice care.  Hospice team to follow him while inpatient. Plan to send him back tomorrow once DME is arranged.      Nursing supportive care. Fall, aspiration precautions. Diet:  Diet Orders (From admission, onward)     Start     Ordered   10/09/23 1025  Diet NPO time specified Except for: Sips with Meds, Ice Chips  Diet effective now       Question Answer Comment  Except for Sips with Meds   Except for Ice Chips      10/09/23 1025           DVT prophylaxis: enoxaparin  (LOVENOX ) injection 30 mg Start: 10/12/23 1230 SCDs Start: 10/10/23 1805 SCDs Start: 10/08/23 2307  Level of care: Telemetry Medical   Code Status: Limited: Do not attempt resuscitation (DNR) -DNR-LIMITED -Do Not Intubate/DNI   Subjective: Patient is seen and examined today morning. He is alert, shows rest room, abdomen distended. Daughter at bedside,  Physical Exam: Vitals:   10/13/23 2101 10/14/23 0400 10/14/23 0500 10/14/23 0753  BP: (!) 159/77 (!) 151/61  (!) 161/54  Pulse: 62 61  62  Resp: 16 16  17   Temp:  98.4 F (36.9 C)  98.6 F (37 C)  TempSrc:  Oral    SpO2: 91% 94%  95%  Weight:   80.4 kg   Height:        General - Elderly ill looking Caucasian male, confused, no distress HEENT - PERRLA, EOMI, atraumatic head, non tender sinuses. Lung - Clear, basal rales, rhonchi, no wheezes. Heart - S1, S2 heard,  no murmurs, rubs, 1+ pedal edema. Abdomen - Soft, non tender, bowel sounds good Neuro - awake, confused, unable to do full neuro exam. Skin - Warm and dry. Chronic leg skin changes.  Data Reviewed:      Latest Ref Rng & Units 10/14/2023    7:49 AM 10/13/2023    7:55 AM 10/12/2023    7:07 AM  CBC  WBC 4.0 - 10.5 K/uL 3.8  4.7  5.7   Hemoglobin 13.0 - 17.0 g/dL 7.5  8.5  8.6   Hematocrit 39.0 - 52.0 % 23.7  26.7  27.7   Platelets 150 - 400 K/uL 113  110  87       Latest Ref Rng & Units 10/14/2023    7:49 AM 10/13/2023    7:55 AM 10/12/2023    7:07 AM  BMP  Glucose 70 - 99 mg/dL 896  898  98   BUN 8 - 23 mg/dL 87  88  90   Creatinine 0.61 - 1.24 mg/dL 6.33  6.15  5.79   Sodium 135 - 145 mmol/L 149  147  150   Potassium 3.5 - 5.1 mmol/L 4.2  3.9  4.3   Chloride 98 - 111 mmol/L 120  119  121   CO2 22 - 32 mmol/L 19  18  17    Calcium  8.9 - 10.3 mg/dL 8.2  7.9  7.6    No results found.   Family Communication: Daughter and daughter in law at bedside 10/13/23, discussed care plan. DC plan once dme is arranged, likely Monday.  Disposition: Status is: Inpatient Remains inpatient appropriate because: s/p orif, pain control, supportive care.  Planned Discharge Destination: Skilled nursing facility     Time spent: 40 minutes  Author: Concepcion Riser, MD 10/14/2023 9:36 AM Secure chat 7am to 7pm For on call review [email or web address].

## 2023-10-14 NOTE — Progress Notes (Signed)
 [name] 3W91 [facility] Liaison Note   [name] is a current patient with [facility] with a terminal diagnosis of hypertensive heart disease with heart failure. Patient was admitted 10/08/2023 after a fall at his ALF. Per Dr. Todd Newcomer with [facility] this is a related hospital admission. Patient is a DNR.   Visited patient at bedside. Patient eyes glazed. Breathing seems a bit faster than yesterday. Extremities are cool to the touch. Spoke with provider about patient current status via phone. Patient transitioned to comfort care. Spoke with daughter via phone and discussed options, including Psychologist, sport and exercise. Daughter states she wants to see how patient does today before making any decisions. Contacted [facility] and cancelled hospital bed delivery.    Patient remains inpatient appropriate for skilled monitoring, assessment as well as continued IV fluids and IV management of symptoms.    V/S: 98.6/62/17    161/54   O2 95% on 2LPM via [place]   I/O: 730/850   Abnormal Labs:  10/14/23 07:49 Basic metabolic panel with GFR: Rpt ! Sodium: 149 (H) Chloride: 120 (H) CO2: 19 (L) Glucose: 103 (H) BUN: 87 (H) Creatinine: 3.66 (H) Calcium : 8.2 (L) GFR, Estimated: 15 (L) WBC: 3.8 (L) RBC: 2.36 (L) Hemoglobin: 7.5 (L) HCT: 23.7 (L) MCV: 100.4 (H) RDW: 17.8 (H) Platelets: 113 (L)   Diagnostics: none new   IV/PRN medications:  Morphine  0.5mg  IV x1    Assessment and Plan per Darci Pore, MD 8.17.25   Assessment and Plan: Acute displaced subtrochanteric fracture of proximal left femur- Status post mechanical fall 8/13 s/p Cephalomedullary nailing of left intertrochanteric femur fracture  Continue pain control, supportive care. Hb stable >8.    Acute on CKD stage IV: Got gentle IV hydration. Started on diet, but poor nutrition. Avoid nephrotoxic drugs. UA unremarkable for infection. Monitor daily renal function.   Acute  on chronic anemia- Patient's baseline hemoglobin ranges 9-10. His hemoglobin 5.6, received total 2 units of blood, this am Hb 7.5 Discussed with family to stop Eliquis  upon discharge. Follow h/h and transfuse for Hb < 7.   Hypovolemic hypernatremia- In the setting of poor oral intake. Na 149 stable. Encourage oral fluids, diet. Trend sodium. Monitor strict input and output.   Dysphagia- Family refused barium swallow study and PEG tube. Wished for pleasure feeds, aware of aspiration risk. Regular diet ordered.  Plan to send him back to ILF with hospice services. DME need to be arranged.   Persistent A-fib: EKG shows Afib rate controlled. Hold home Eliquis  due to drop in Hb. Discussed with family to hold Eliquis  at discharge   Essential hypertension: Hold oral meds as he is more sleepy. IV hydralazine  as needed for elevated blood pressures.   Hyperlipidemia: Resume statin upon discharge.   Depression: On Lexapro  PTA.   Hospice patient on [facility]- Patient from independent living facility under hospice care.  Hospice team to follow him while inpatient. Plan to send him back tomorrow once DME is arranged.   Discharge Plan: Transitioned to comfort care.    Family contact: Spoke with daughter, [name] via phone   IDT: updated   Goals of care: clear, comfort focused care, DNR   Should patient need ambulance transport, please use [facility] as they contract this service for our active hospice patients.   Please call with any hospice questions or concerns.   Nat Babe, BSN, [facility] Liaison [phone]

## 2023-10-15 DIAGNOSIS — E870 Hyperosmolality and hypernatremia: Secondary | ICD-10-CM | POA: Diagnosis not present

## 2023-10-15 DIAGNOSIS — S72002A Fracture of unspecified part of neck of left femur, initial encounter for closed fracture: Secondary | ICD-10-CM | POA: Diagnosis not present

## 2023-10-15 DIAGNOSIS — Z515 Encounter for palliative care: Secondary | ICD-10-CM | POA: Diagnosis not present

## 2023-10-15 DIAGNOSIS — W19XXXA Unspecified fall, initial encounter: Secondary | ICD-10-CM | POA: Diagnosis not present

## 2023-10-29 NOTE — Progress Notes (Signed)
 Physical Therapy Treatment Patient Details Name: [name] MRN: [id number] DOB: 1928-05-30 Today's Date: 10-18-2023   History of Present Illness Pt is a 89 y/o male who presents 10/08/2023 s/p fall. He is now s/p cephalomedullary nailing of left intertrochanteric femur fracture on 10/10/2023. PMH significant for CVA with residual R side deficits, HTN, a-fib, pulmonary HTN, tricuspid regurgitation, back surgery, abdominal surgery, CABG x5, hernia repair.    PT Comments  Pt received in bed alert with family present. Discussed whether pt wanted to work with PT and pt expressed a desire to continue to try to mobilize. Pt assisted in coming to EOB and seemed pleased to be able to sit up on his own. Pt needs 2 person assist for mobility and is currently unable to stand due to baseline RLE weakness and new LLE pain and weakness from femur fx. Patient will benefit from continued inpatient follow up therapy, <3 hours/day. PT will continue to follow.     If plan is discharge home, recommend the following: Two people to help with walking and/or transfers;Two people to help with bathing/dressing/bathroom;Assistance with cooking/housework;Assist for transportation   Can travel by private vehicle     No  Equipment Recommendations  Wheelchair (measurements PT)    Recommendations for Other Services       Precautions / Restrictions Precautions Precautions: Fall Recall of Precautions/Restrictions: Intact Precaution/Restrictions Comments: speaks minimally Restrictions Weight Bearing Restrictions Per Provider Order: Yes LLE Weight Bearing Per Provider Order: Weight bearing as tolerated     Mobility  Bed Mobility Overal bed mobility: Needs Assistance Bed Mobility: Supine to Sit, Sit to Supine     Supine to sit: +2 for physical assistance, Mod assist Sit to supine: Total assist, +2 for physical assistance   General bed mobility comments: pt assisting with trunk activation and RLE mvmt with  coming to EOB. Has minimal use of RUE. Dependent for return to supine    Transfers                   General transfer comment: Unable to progress to OOB at this time with RLE weak at baseline and LLE acutely injured    Ambulation/Gait               General Gait Details: unable   Stairs             Wheelchair Mobility     Tilt Bed    Modified Rankin (Stroke Patients Only)       Balance Overall balance assessment: Needs assistance Sitting-balance support: Feet supported, Single extremity supported Sitting balance-Leahy Scale: Fair Sitting balance - Comments: pt able to maintain static sitting EOB with supervision. Tolerated ~10 mins before fatiguing and asking to return to bed but pt expressed gratitude at being able to sit up                                    Communication Communication Communication: Impaired Factors Affecting Communication: Difficulty expressing self  Cognition Arousal: Alert Behavior During Therapy: Flat affect   PT - Cognitive impairments: Difficult to assess Difficult to assess due to: Impaired communication                     PT - Cognition Comments: gives thumbs up at times and sometimes says a word or two Following commands: Impaired Following commands impaired: Follows one step commands inconsistently, Follows one step  commands with increased time    Cueing Cueing Techniques: Verbal cues, Gestural cues, Tactile cues, Visual cues  Exercises General Exercises - Lower Extremity Long Arc Quad: 5 reps, AAROM, Left, Seated    General Comments General comments (skin integrity, edema, etc.): VSS on 2 L O2 via [place].      Pertinent Vitals/Pain Pain Assessment Pain Assessment: Faces Faces Pain Scale: Hurts even more Pain Location: LLE with mvmt Pain Descriptors / Indicators: Discomfort, Grimacing Pain Intervention(s): Limited activity within patient's tolerance, Monitored during session    Home  Living                          Prior Function            PT Goals (current goals can now be found in the care plan section) Acute Rehab PT Goals Patient Stated Goal: get up PT Goal Formulation: Patient unable to participate in goal setting Time For Goal Achievement: 10/25/23 Potential to Achieve Goals: Fair Progress towards PT goals: Progressing toward goals    Frequency    Min 1X/week      PT Plan      Co-evaluation              AM-PAC PT 6 Clicks Mobility   Outcome Measure  Help needed turning from your back to your side while in a flat bed without using bedrails?: Total Help needed moving from lying on your back to sitting on the side of a flat bed without using bedrails?: Total Help needed moving to and from a bed to a chair (including a wheelchair)?: Total Help needed standing up from a chair using your arms (e.g., wheelchair or bedside chair)?: Total Help needed to walk in hospital room?: Total Help needed climbing 3-5 steps with a railing? : Total 6 Click Score: 6    End of Session Equipment Utilized During Treatment: Oxygen Activity Tolerance: Patient limited by pain;Patient limited by fatigue Patient left: in bed;with call bell/phone within reach;with family/visitor present Nurse Communication: Mobility status PT Visit Diagnosis: Pain;Difficulty in walking, not elsewhere classified (R26.2);History of falling (Z91.81) Pain - Right/Left: Left Pain - part of body: Hip;Knee     Time: 1131-1156 PT Time Calculation (min) (ACUTE ONLY): 25 min  Charges:    $Therapeutic Activity: 23-37 mins PT General Charges $$ ACUTE PT VISIT: 1 Visit                     Richerd Lipoma, PT  Acute Rehab Services Secure chat preferred Office [phone]    Richerd CROME Imer Foxworth 11-05-2023, 2:12 PM

## 2023-10-29 NOTE — Death Summary Note (Signed)
 DEATH SUMMARY   Patient Details  Name: [name] MRN: [id number] DOB: 06/19/1928 ERE:Ylwuzm, [name], MD Admission/Discharge Information   Admit Date:  2023-10-19  Date of Death: Date of Death: 2023-10-26  Time of Death: Time of Death: 1530  Length of Stay: 7   Principle Cause of death: Acute blood loss anemia  Hospital Diagnoses: Principal Problem:   Closed left hip fracture (HCC) Active Problems:   HLD (hyperlipidemia)   History of essential hypertension   Depression   Acute renal failure superimposed on stage 4 chronic kidney disease (HCC)   Fall at home, initial encounter   Acute on chronic anemia   Acute hypernatremia   SIRS (systemic inflammatory response syndrome) (HCC)   Persistent atrial fibrillation [facility])   Hospital Course: [name] is a 89 y.o. male with medical history significant for ischemic stroke with residual expressive aphasia, nonverbal at baseline, residual right hemiparesis, persistent atrial fibrillation chronically anticoagulated on Eliquis , anemia of chronic disease baseline hemoglobin 9-10, CKD stage IV with baseline creatinine 3.2-3.4, essential hypertension, chronic right-sided systolic heart failure, who is admitted to [facility] on Oct 19, 2023 with acute left hip fracture after presenting from snf to [facility] ED for evaluation of fall.    Patient sustained acute left hip fracture, laboratory workup notable for acute kidney injury, hyponatremia, acute on chronic anemia admitted to TRH service for further management evaluation, orthopedic consulted. Patient got 2 units of blood, got another 2 units pre and intra-op. S/p Cephalomedullary nailing of left intertrochanteric femur fracture 10/10/23. Patient gradually deteriorated, could not work with PT, failed swallow evaluation. Family refused swallow evaluation, wished for pleasure feeds, knew about risk of aspiration. Eating poor, very weak. Daughter and son at bedside aware of his poor  prognosis due to his advanced age. [facility] hospice followed him while in hospital. Patient expired 10-26-2023 15:30.      Procedures:  s/p Cephalomedullary nailing of left intertrochanteric femur fracture   Consultations: Orthopedic surgery.  The results of significant diagnostics from this hospitalization (including imaging, microbiology, ancillary and laboratory) are listed below for reference.   Significant Diagnostic Studies: DG FEMUR PORT MIN 2 VIEWS LEFT Result Date: 10/10/2023 CLINICAL DATA:  Fracture, postop. EXAM: LEFT FEMUR PORTABLE 2 VIEWS COMPARISON:  Preoperative imaging FINDINGS: Femoral intramedullary nail with trans trochanteric and distal locking screw fixation traverse proximal femur fracture. Improved fracture alignment from preoperative imaging. Persistent displacement of the lesser trochanteric component. Recent postsurgical change includes air and edema in the soft tissues. Vascular calcifications are seen. IMPRESSION: ORIF of proximal femur fracture, in improved alignment from preoperative imaging. Electronically Signed   By: Andrea Gasman M.D.   On: 10/10/2023 20:40   DG FEMUR MIN 2 VIEWS LEFT Result Date: 10/10/2023 CLINICAL DATA:  Elective surgery.  Fracture fixation. EXAM: LEFT FEMUR 2 VIEWS COMPARISON:  Radiograph 10-19-2023 FINDINGS: Six fluoroscopic spot views of the femur submitted from the operating room. Femoral intramedullary nail with trans trochanteric and distal locking screw fixation traverse comminuted proximal femur fracture. Fluoroscopy time 1 minutes 31 seconds. Dose 15.14 mGy. IMPRESSION: Intraoperative fluoroscopy during femur fracture fixation. Electronically Signed   By: Andrea Gasman M.D.   On: 10/10/2023 17:35   DG C-Arm 1-60 Min-No Report Result Date: 10/10/2023 Fluoroscopy was utilized by the requesting physician.  No radiographic interpretation.   DG C-Arm 1-60 Min-No Report Result Date: 10/10/2023 Fluoroscopy was utilized by the  requesting physician.  No radiographic interpretation.   CT Head Wo Contrast Result Date: October 19, 2023 CLINICAL  DATA:  Recent fall with headaches and neck pain, initial encounter EXAM: CT HEAD WITHOUT CONTRAST CT CERVICAL SPINE WITHOUT CONTRAST TECHNIQUE: Multidetector CT imaging of the head and cervical spine was performed following the standard protocol without intravenous contrast. Multiplanar CT image reconstructions of the cervical spine were also generated. RADIATION DOSE REDUCTION: This exam was performed according to the departmental dose-optimization program which includes automated exposure control, adjustment of the mA and/or kV according to patient size and/or use of iterative reconstruction technique. COMPARISON:  09/17/2021 FINDINGS: CT HEAD FINDINGS Brain: There are again noted changes consistent with prior left MCA infarct with considerable encephalomalacia. Atrophic changes and chronic white matter ischemic changes are seen as well. No acute hemorrhage, acute infarction or space-occupying mass lesion is noted. Vascular: No hyperdense vessel or unexpected calcification. Skull: Normal. Negative for fracture or focal lesion. Sinuses/Orbits: No acute finding. Other: None. CT CERVICAL SPINE FINDINGS Alignment: Within normal limits. Skull base and vertebrae: 7 cervical segments are well visualized. Vertebral body height is well maintained. Prior fusion at C6-7 is noted. Multilevel facet hypertrophic changes and osteophytic changes are seen. No acute fracture or acute facet abnormality is noted. Soft tissues and spinal canal: Surrounding soft tissue structures are within normal limits. Upper chest: Visualized lung apices show bilateral pleural effusions which appear moderate to large worse on the right than the left. Other: None IMPRESSION: CT of the head: No acute intracranial abnormality noted. Changes of prior left MCA infarct. Chronic atrophic and ischemic changes. CT of the cervical spine: Multilevel  degenerative change without acute abnormality. Electronically Signed   By: Oneil Devonshire M.D.   On: 10/08/2023 22:35   CT Cervical Spine Wo Contrast Result Date: 10/08/2023 CLINICAL DATA:  Recent fall with headaches and neck pain, initial encounter EXAM: CT HEAD WITHOUT CONTRAST CT CERVICAL SPINE WITHOUT CONTRAST TECHNIQUE: Multidetector CT imaging of the head and cervical spine was performed following the standard protocol without intravenous contrast. Multiplanar CT image reconstructions of the cervical spine were also generated. RADIATION DOSE REDUCTION: This exam was performed according to the departmental dose-optimization program which includes automated exposure control, adjustment of the mA and/or kV according to patient size and/or use of iterative reconstruction technique. COMPARISON:  09/17/2021 FINDINGS: CT HEAD FINDINGS Brain: There are again noted changes consistent with prior left MCA infarct with considerable encephalomalacia. Atrophic changes and chronic white matter ischemic changes are seen as well. No acute hemorrhage, acute infarction or space-occupying mass lesion is noted. Vascular: No hyperdense vessel or unexpected calcification. Skull: Normal. Negative for fracture or focal lesion. Sinuses/Orbits: No acute finding. Other: None. CT CERVICAL SPINE FINDINGS Alignment: Within normal limits. Skull base and vertebrae: 7 cervical segments are well visualized. Vertebral body height is well maintained. Prior fusion at C6-7 is noted. Multilevel facet hypertrophic changes and osteophytic changes are seen. No acute fracture or acute facet abnormality is noted. Soft tissues and spinal canal: Surrounding soft tissue structures are within normal limits. Upper chest: Visualized lung apices show bilateral pleural effusions which appear moderate to large worse on the right than the left. Other: None IMPRESSION: CT of the head: No acute intracranial abnormality noted. Changes of prior left MCA infarct.  Chronic atrophic and ischemic changes. CT of the cervical spine: Multilevel degenerative change without acute abnormality. Electronically Signed   By: Oneil Devonshire M.D.   On: 10/08/2023 22:35   DG Hip Unilat W or Wo Pelvis 2-3 Views Left Result Date: 10/08/2023 CLINICAL DATA:  trauma EXAM: DG HIP (WITH OR WITHOUT  PELVIS) 2-3V LEFT COMPARISON:  September 17, 2021 FINDINGS: Osteopenia.Obliquely oriented, comminuted, and displaced subtrochanteric fracture of the proximal femur.The femoral shaft is posteromedially displaced at least 1 shaft width. The fracture line extends into the lesser trochanter, which is medially displaced 1.6 cm.No dislocation of the hip. Lumbar degenerative disc disease. Extensive aortoiliac and peripheral vascular atherosclerosis. IMPRESSION: Obliquely oriented, displaced subtrochanteric fracture of the proximal femur with involvement of the lesser trochanter, as further described above. If clinically warranted, a dedicated CT of the left hip may be of benefit for preoperative planning. Electronically Signed   By: Rogelia Myers M.D.   On: 10/08/2023 21:03   DG Chest Port 1 View Result Date: 10/08/2023 CLINICAL DATA:  trauma EXAM: PORTABLE CHEST - 1 VIEW COMPARISON:  June 25, 2023 FINDINGS: Bilateral perihilar interstitial opacities throughout both lungs. Moderate volume bilateral pleural effusions. No pneumothorax. Mild cardiomegaly. Sternotomy wires and CABG changes. Tortuous aorta with aortic atherosclerosis. No acute, displaced fracture or destructive lesions. Multilevel thoracic osteophytosis. IMPRESSION: Mild cardiomegaly findings of interstitial edema throughout both lungs. Moderate volume bilateral pleural effusions. Electronically Signed   By: Rogelia Myers M.D.   On: 10/08/2023 20:58   DG Elbow Complete Left Result Date: 10/08/2023 CLINICAL DATA:  trauma EXAM: LEFT ELBOW - COMPLETE 3+ VIEW COMPARISON:  None Available. FINDINGS: Osteopenia.No acute, displaced fracture or  dislocation. Superior elevation of the anterior fat pad. There is no evidence of arthropathy or other focal bone abnormality. Soft tissues are unremarkable. IMPRESSION: Elevated anterior fat pad suggests the presence of an elbow joint effusion. While no acute, displaced fracture is visualized, in the setting of trauma, elevation of the anterior fat pad is worrisome for underlying effusion and occult elbow fracture. Electronically Signed   By: Rogelia Myers M.D.   On: 10/08/2023 20:56   DG Shoulder Left Result Date: 10/08/2023 CLINICAL DATA:  fall, injury EXAM: LEFT SHOULDER - 2+ VIEW COMPARISON:  None Available. FINDINGS: Osteopenia.No acute fracture or dislocation. There is no evidence of arthropathy or other focal bone abnormality. Soft tissues are unremarkable. IMPRESSION: No acute fracture or dislocation. Electronically Signed   By: Rogelia Myers M.D.   On: 10/08/2023 20:54    Microbiology: Recent Results (from the past 240 hours)  Surgical pcr screen     Status: Abnormal   Collection Time: 10/09/23 11:57 AM   Specimen: Nasal Mucosa; Nasal Swab  Result Value Ref Range Status   MRSA, PCR NEGATIVE NEGATIVE Final   Staphylococcus aureus POSITIVE (A) NEGATIVE Final    Comment: (NOTE) The Xpert SA Assay (FDA approved for NASAL specimens in patients 39 years of age and older), is one component of a comprehensive surveillance program. It is not intended to diagnose infection nor to guide or monitor treatment. Performed at [facility], [address], [place], [place] [address]     Time spent: 42 minutes  Signed: Concepcion Riser, MD 11/12/2023

## 2023-10-29 NOTE — Progress Notes (Signed)
     [name] Liaison Note   [name] is a current patient with [facility] with a terminal diagnosis of hypertensive heart disease with heart failure. Patient was admitted 10/08/2023 after a fall at his ALF. Per Dr. Todd [name] with [facility] this is a related hospital admission. Patient is a DNR.   Patient is alert during visit with family reporting the same since yesterday in the evening. Noted significant coughing after taking medications with applesauce. Discussed options for discharge including back to independent living with hospice and [facility] care vs [facility]. Attending visited during my visit. Family and attending in agreement with plan to wait until tomorrow to make discharge decision as he has changed significantly since yesterday.     Patient remains inpatient appropriate for skilled monitoring/assessment to determine appropriate discharge disposition.   V/S: 98.6/82/20    160/87  O2 94% on 2.5LPM via [place]   I/O: not documented/800   Abnormal Labs: Na 149, CO2 19, BUN 87, Cre 3.66, GFRe 15, WBC 3.8, HGB 7.5, Plt 113   Diagnostics: none new   IV/PRN medications:  Ultram  50mg  po x 1    Assessment and Plan per Darci Pore, MD 8.17.25   Assessment and Plan: Acute displaced subtrochanteric fracture of proximal left femur- Status post mechanical fall 8/13 s/p Cephalomedullary nailing of left intertrochanteric femur fracture  Continue pain control, supportive care. Hb stable >8.    Acute on CKD stage IV: Got gentle IV hydration. Started on diet, but poor nutrition. Avoid nephrotoxic drugs. UA unremarkable for infection. Monitor daily renal function.   Acute on chronic anemia- Patient's baseline hemoglobin ranges 9-10. His hemoglobin 5.6, received total 2 units of blood, this am Hb 7.5 Discussed with family to stop Eliquis  upon discharge. Follow h/h and transfuse for Hb < 7.    Hypovolemic hypernatremia- In the setting of poor oral intake. Na 149 stable. Encourage oral fluids, diet. Trend sodium. Monitor strict input and output.   Dysphagia- Family refused barium swallow study and PEG tube. Wished for pleasure feeds, aware of aspiration risk. Regular diet ordered.  Plan to send him back to ILF with hospice services. DME need to be arranged.   Persistent A-fib: EKG shows Afib rate controlled. Hold home Eliquis  due to drop in Hb. Discussed with family to hold Eliquis  at discharge   Essential hypertension: Hold oral meds as he is more sleepy. IV hydralazine  as needed for elevated blood pressures.   Hyperlipidemia: Resume statin upon discharge.   Depression: On Lexapro  PTA.   Hospice patient on [facility]- Patient from independent living facility under hospice care.  Hospice team to follow him while inpatient. Plan to send him back tomorrow once DME is arranged.   Discharge Plan: Transitioned to comfort care.    Family contact: spoke with son and daughter at bedside   IDT: updated   Goals of care: clear, comfort focused care, DNR   Should patient need ambulance transport, please use [facility] as they contract this service for our active hospice patients.   Please call with any hospice questions or concerns.   Elouise Husband, BSN, RN, [facility] Liaison [phone]

## 2023-10-29 NOTE — Progress Notes (Signed)
 Progress Note   Patient: [name] FMW:[id number] DOB: Jun 22, 1928 DOA: 10/08/2023     7 DOS: the patient was seen and examined on November 09, 2023   Brief hospital course: [name] is a 89 y.o. male with medical history significant for ischemic stroke with residual expressive aphasia, nonverbal at baseline, residual right hemiparesis, persistent atrial fibrillation chronically anticoagulated on Eliquis , anemia of chronic disease baseline hemoglobin 9-10, CKD stage IV with baseline creatinine 3.2-3.4, essential hypertension, chronic right-sided systolic heart failure, who is admitted to [facility] on 10/08/2023 with acute left hip fracture after presenting from snf to [facility] ED for evaluation of fall.   Patient sustained acute left hip fracture, laboratory workup notable for acute kidney injury, hyponatremia, acute on chronic anemia admitted to TRH service for further management evaluation, orthopedic consulted.  S/p Cephalomedullary nailing of left intertrochanteric femur fracture 10/10/23. Family wish to wait for his progress and decide on disposition, home hospice vs facility. [facility] on board.  Assessment and Plan: Acute displaced subtrochanteric fracture of proximal left femur- Status post mechanical fall 8/13 s/p Cephalomedullary nailing of left intertrochanteric femur fracture  Continue pain control, supportive care.  Acute on CKD stage IV: Got gentle IV hydration. Started on diet, but poor nutrition. Avoid nephrotoxic drugs. UA unremarkable for infection. Monitor daily renal function.  Acute on chronic anemia- Patient's baseline hemoglobin ranges 9-10. His hemoglobin 5.6, received total 2 units of blood, this am Hb 7.5 Discussed with family to stop Eliquis  upon discharge.  Hypovolemic hypernatremia- In the setting of poor oral intake. Na 149 stable. Encourage oral fluids, diet. No more trending of sodium as he is comfort care only.  Dysphagia- Family refused  barium swallow study and PEG tube. Wished for pleasure feeds, aware of aspiration risk. Regular diet ordered.   Persistent A-fib: Hold home Eliquis  due to drop in Hb. Discussed with family to hold Eliquis  at discharge  Essential hypertension: Hold oral meds as he is more sleepy. IV hydralazine  as needed for elevated blood pressures.  Hyperlipidemia: Resume statin upon discharge.  Depression: On Lexapro  PTA.  Hospice patient on [facility]- Patient from independent living facility under hospice care.  Hospice team to follow him while inpatient.  Plan is to see how he does in next 2 days before transferring to home hospice vs facility.     Nursing supportive care. Fall, aspiration precautions. Diet:  Diet Orders (From admission, onward)     Start     Ordered   10/09/23 1025  Diet NPO time specified Except for: Sips with Meds, Ice Chips  Diet effective now       Question Answer Comment  Except for Sips with Meds   Except for Ice Chips      10/09/23 1025           DVT prophylaxis: SCDs Start: 10/10/23 1805 SCDs Start: 10/08/23 2307  Level of care: Telemetry Medical   Code Status: Do not attempt resuscitation (DNR) - Comfort care  Subjective: Patient is seen and examined today morning. He is more awake, shows thumbs up sign. Discussed with family at bedside and [facility] RN at bedside,  Physical Exam: Vitals:   10/14/23 0753 10/14/23 1722 10/14/23 2022 2023/11/09 0444  BP: (!) 161/54 (!) 160/66 (!) 157/56 (!) 160/87  Pulse: 62 62 63 82  Resp: 17 20 20 20   Temp: 98.6 F (37 C)  97.6 F (36.4 C) 98.6 F (37 C)  TempSrc:   Oral Axillary  SpO2: 95% 94% 95% 94%  Weight:      Height:        General - Elderly ill looking Caucasian male, no distress HEENT - PERRLA, EOMI, atraumatic head, non tender sinuses. Lung - Clear, basal rales,shallow breathing. Heart - S1, S2 heard, no murmurs, rubs, 2+ pedal edema. Abdomen - Soft, non tender, bowel sounds good Neuro -  alert, awake, unable to do full neuro exam. Skin - Warm and dry. Chronic leg skin changes.  Data Reviewed:      Latest Ref Rng & Units 10/14/2023    7:49 AM 10/13/2023    7:55 AM 10/12/2023    7:07 AM  CBC  WBC 4.0 - 10.5 K/uL 3.8  4.7  5.7   Hemoglobin 13.0 - 17.0 g/dL 7.5  8.5  8.6   Hematocrit 39.0 - 52.0 % 23.7  26.7  27.7   Platelets 150 - 400 K/uL 113  110  87       Latest Ref Rng & Units 10/14/2023    7:49 AM 10/13/2023    7:55 AM 10/12/2023    7:07 AM  BMP  Glucose 70 - 99 mg/dL 896  898  98   BUN 8 - 23 mg/dL 87  88  90   Creatinine 0.61 - 1.24 mg/dL 6.33  6.15  5.79   Sodium 135 - 145 mmol/L 149  147  150   Potassium 3.5 - 5.1 mmol/L 4.2  3.9  4.3   Chloride 98 - 111 mmol/L 120  119  121   CO2 22 - 32 mmol/L 19  18  17    Calcium  8.9 - 10.3 mg/dL 8.2  7.9  7.6    No results found.   Family Communication: discussed with family regarding hospice/ comfort care plan.  Disposition: Status is: Inpatient Remains inpatient appropriate because: comfort and supportive care.  Planned Discharge Destination: Skilled nursing facility     Time spent: 44 minutes  Author: Concepcion Riser, MD 10-29-23 3:13 PM Secure chat 7am to 7pm For on call review [email or web address].

## 2023-10-29 NOTE — Progress Notes (Signed)
 SLP Cancellation Note  Patient Details Name: [name] MRN: [id number] DOB: 12-23-1928   Cancelled treatment:        Pt transitioning to comfort care. SLP will sign off at this time.    Anette FORBES Grippe, MA, CCC-SLP Acute Rehabilitation Services Office: [phone] 10-17-2023, 9:15 AM

## 2023-10-29 DEATH — deceased
